# Patient Record
Sex: Male | Born: 1950 | Race: Black or African American | Hispanic: No | Marital: Married | Smoking: Former smoker
Health system: Southern US, Community
[De-identification: ages and names within clinical notes are randomized; demographics above are authoritative.]

## PROBLEM LIST (undated history)

## (undated) DIAGNOSIS — J302 Other seasonal allergic rhinitis: Secondary | ICD-10-CM

## (undated) DIAGNOSIS — G8929 Other chronic pain: Secondary | ICD-10-CM

## (undated) DIAGNOSIS — R200 Anesthesia of skin: Secondary | ICD-10-CM

## (undated) DIAGNOSIS — J449 Chronic obstructive pulmonary disease, unspecified: Secondary | ICD-10-CM

## (undated) DIAGNOSIS — M1990 Unspecified osteoarthritis, unspecified site: Secondary | ICD-10-CM

## (undated) DIAGNOSIS — M21379 Foot drop, unspecified foot: Secondary | ICD-10-CM

## (undated) DIAGNOSIS — N400 Enlarged prostate without lower urinary tract symptoms: Secondary | ICD-10-CM

## (undated) DIAGNOSIS — K219 Gastro-esophageal reflux disease without esophagitis: Secondary | ICD-10-CM

## (undated) DIAGNOSIS — E785 Hyperlipidemia, unspecified: Secondary | ICD-10-CM

## (undated) DIAGNOSIS — G709 Myoneural disorder, unspecified: Secondary | ICD-10-CM

## (undated) DIAGNOSIS — R350 Frequency of micturition: Secondary | ICD-10-CM

## (undated) DIAGNOSIS — K5790 Diverticulosis of intestine, part unspecified, without perforation or abscess without bleeding: Secondary | ICD-10-CM

## (undated) DIAGNOSIS — I10 Essential (primary) hypertension: Secondary | ICD-10-CM

## (undated) DIAGNOSIS — R351 Nocturia: Secondary | ICD-10-CM

## (undated) DIAGNOSIS — M549 Dorsalgia, unspecified: Secondary | ICD-10-CM

## (undated) DIAGNOSIS — T7840XA Allergy, unspecified, initial encounter: Secondary | ICD-10-CM

## (undated) DIAGNOSIS — E291 Testicular hypofunction: Secondary | ICD-10-CM

## (undated) DIAGNOSIS — F419 Anxiety disorder, unspecified: Secondary | ICD-10-CM

## (undated) HISTORY — PX: COLONOSCOPY: SHX174

## (undated) HISTORY — DX: Benign prostatic hyperplasia without lower urinary tract symptoms: N40.0

## (undated) HISTORY — PX: CERVICAL FUSION: SHX112

## (undated) HISTORY — DX: Chronic obstructive pulmonary disease, unspecified: J44.9

## (undated) HISTORY — DX: Hyperlipidemia, unspecified: E78.5

## (undated) HISTORY — DX: Gastro-esophageal reflux disease without esophagitis: K21.9

## (undated) HISTORY — PX: KNEE ARTHROSCOPY: SUR90

## (undated) HISTORY — PX: SEPTOPLASTY: SUR1290

## (undated) HISTORY — DX: Essential (primary) hypertension: I10

## (undated) HISTORY — DX: Testicular hypofunction: E29.1

## (undated) HISTORY — PX: BACK SURGERY: SHX140

## (undated) HISTORY — DX: Other seasonal allergic rhinitis: J30.2

## (undated) HISTORY — DX: Allergy, unspecified, initial encounter: T78.40XA

---

## 1999-01-06 ENCOUNTER — Ambulatory Visit (HOSPITAL_COMMUNITY): Admission: RE | Admit: 1999-01-06 | Discharge: 1999-01-06 | Payer: Self-pay | Admitting: *Deleted

## 1999-01-29 ENCOUNTER — Ambulatory Visit (HOSPITAL_COMMUNITY): Admission: RE | Admit: 1999-01-29 | Discharge: 1999-01-29 | Payer: Self-pay | Admitting: Internal Medicine

## 1999-02-11 ENCOUNTER — Encounter: Payer: Self-pay | Admitting: Internal Medicine

## 1999-02-11 ENCOUNTER — Ambulatory Visit (HOSPITAL_COMMUNITY): Admission: RE | Admit: 1999-02-11 | Discharge: 1999-02-11 | Payer: Self-pay | Admitting: Internal Medicine

## 2000-01-10 ENCOUNTER — Ambulatory Visit (HOSPITAL_COMMUNITY): Admission: RE | Admit: 2000-01-10 | Discharge: 2000-01-10 | Payer: Self-pay | Admitting: *Deleted

## 2001-01-17 ENCOUNTER — Ambulatory Visit (HOSPITAL_COMMUNITY): Admission: RE | Admit: 2001-01-17 | Discharge: 2001-01-17 | Payer: Self-pay | Admitting: Internal Medicine

## 2001-01-17 ENCOUNTER — Encounter: Payer: Self-pay | Admitting: Internal Medicine

## 2001-04-04 ENCOUNTER — Encounter: Payer: Self-pay | Admitting: Gastroenterology

## 2001-08-23 ENCOUNTER — Emergency Department (HOSPITAL_COMMUNITY): Admission: EM | Admit: 2001-08-23 | Discharge: 2001-08-23 | Payer: Self-pay | Admitting: Emergency Medicine

## 2001-08-23 ENCOUNTER — Encounter: Payer: Self-pay | Admitting: Emergency Medicine

## 2002-11-26 ENCOUNTER — Ambulatory Visit (HOSPITAL_COMMUNITY): Admission: RE | Admit: 2002-11-26 | Discharge: 2002-11-26 | Payer: Self-pay | Admitting: Internal Medicine

## 2002-11-26 ENCOUNTER — Encounter: Payer: Self-pay | Admitting: Internal Medicine

## 2005-02-03 ENCOUNTER — Ambulatory Visit (HOSPITAL_COMMUNITY): Admission: RE | Admit: 2005-02-03 | Discharge: 2005-02-03 | Payer: Self-pay | Admitting: Internal Medicine

## 2006-09-12 HISTORY — PX: SHOULDER SURGERY: SHX246

## 2006-11-23 ENCOUNTER — Ambulatory Visit (HOSPITAL_COMMUNITY): Admission: RE | Admit: 2006-11-23 | Discharge: 2006-11-23 | Payer: Self-pay | Admitting: Sports Medicine

## 2007-03-01 ENCOUNTER — Ambulatory Visit (HOSPITAL_COMMUNITY): Admission: RE | Admit: 2007-03-01 | Discharge: 2007-03-01 | Payer: Self-pay | Admitting: Internal Medicine

## 2008-03-11 ENCOUNTER — Ambulatory Visit (HOSPITAL_COMMUNITY): Admission: RE | Admit: 2008-03-11 | Discharge: 2008-03-11 | Payer: Self-pay | Admitting: Internal Medicine

## 2008-03-17 ENCOUNTER — Telehealth: Payer: Self-pay | Admitting: Gastroenterology

## 2008-04-01 ENCOUNTER — Ambulatory Visit: Payer: Self-pay | Admitting: Gastroenterology

## 2008-04-01 DIAGNOSIS — K5730 Diverticulosis of large intestine without perforation or abscess without bleeding: Secondary | ICD-10-CM | POA: Insufficient documentation

## 2008-05-07 ENCOUNTER — Ambulatory Visit: Payer: Self-pay | Admitting: Gastroenterology

## 2008-05-07 ENCOUNTER — Encounter: Payer: Self-pay | Admitting: Gastroenterology

## 2008-05-09 ENCOUNTER — Encounter: Payer: Self-pay | Admitting: Gastroenterology

## 2008-06-11 ENCOUNTER — Ambulatory Visit: Payer: Self-pay | Admitting: Gastroenterology

## 2008-07-01 ENCOUNTER — Ambulatory Visit: Payer: Self-pay | Admitting: Gastroenterology

## 2008-07-01 LAB — CONVERTED CEMR LAB
Fecal Occult Blood: NEGATIVE
OCCULT 4: NEGATIVE

## 2008-07-02 ENCOUNTER — Encounter: Payer: Self-pay | Admitting: Gastroenterology

## 2009-01-09 ENCOUNTER — Encounter: Payer: Self-pay | Admitting: Cardiology

## 2009-01-09 ENCOUNTER — Ambulatory Visit: Payer: Self-pay

## 2009-05-13 ENCOUNTER — Emergency Department (HOSPITAL_COMMUNITY): Admission: EM | Admit: 2009-05-13 | Discharge: 2009-05-13 | Payer: Self-pay | Admitting: Emergency Medicine

## 2009-11-10 ENCOUNTER — Ambulatory Visit: Payer: Self-pay | Admitting: Surgery

## 2009-11-10 ENCOUNTER — Ambulatory Visit (HOSPITAL_COMMUNITY): Admission: RE | Admit: 2009-11-10 | Discharge: 2009-11-10 | Payer: Self-pay | Admitting: Internal Medicine

## 2009-12-08 ENCOUNTER — Inpatient Hospital Stay (HOSPITAL_COMMUNITY): Admission: RE | Admit: 2009-12-08 | Discharge: 2009-12-11 | Payer: Self-pay | Admitting: Neurosurgery

## 2010-12-01 ENCOUNTER — Other Ambulatory Visit: Payer: Self-pay | Admitting: Neurosurgery

## 2010-12-01 DIAGNOSIS — M5410 Radiculopathy, site unspecified: Secondary | ICD-10-CM

## 2010-12-02 ENCOUNTER — Ambulatory Visit
Admission: RE | Admit: 2010-12-02 | Discharge: 2010-12-02 | Disposition: A | Discharge status: Home / Self Care | Payer: Private Health Insurance - Indemnity | Source: Ambulatory Visit | Attending: Neurosurgery | Admitting: Neurosurgery

## 2010-12-02 DIAGNOSIS — M5410 Radiculopathy, site unspecified: Secondary | ICD-10-CM

## 2010-12-05 LAB — CBC
HCT: 45.9 % (ref 39.0–52.0)
MCHC: 34.3 g/dL (ref 30.0–36.0)
MCV: 98.3 fL (ref 78.0–100.0)
Platelets: 233 10*3/uL (ref 150–400)
RDW: 13.6 % (ref 11.5–15.5)
WBC: 3.3 10*3/uL — ABNORMAL LOW (ref 4.0–10.5)

## 2010-12-05 LAB — SURGICAL PCR SCREEN: MRSA, PCR: NEGATIVE

## 2010-12-05 LAB — BASIC METABOLIC PANEL
BUN: 16 mg/dL (ref 6–23)
CO2: 27 mEq/L (ref 19–32)
Creatinine, Ser: 1.04 mg/dL (ref 0.4–1.5)
Glucose, Bld: 107 mg/dL — ABNORMAL HIGH (ref 70–99)

## 2010-12-05 LAB — MRSA PCR SCREENING: MRSA by PCR: NEGATIVE

## 2010-12-24 ENCOUNTER — Encounter (HOSPITAL_COMMUNITY)
Admission: RE | Admit: 2010-12-24 | Discharge: 2010-12-24 | Disposition: A | Discharge status: Home / Self Care | Payer: Managed Care, Other (non HMO) | Source: Ambulatory Visit | Attending: Neurosurgery | Admitting: Neurosurgery

## 2010-12-24 LAB — CBC
RBC: 4.25 MIL/uL (ref 4.22–5.81)
WBC: 3.8 10*3/uL — ABNORMAL LOW (ref 4.0–10.5)

## 2010-12-24 LAB — SURGICAL PCR SCREEN: MRSA, PCR: NEGATIVE

## 2010-12-29 ENCOUNTER — Ambulatory Visit (HOSPITAL_COMMUNITY)
Admission: RE | Admit: 2010-12-29 | Discharge: 2010-12-29 | Disposition: A | Discharge status: Home / Self Care | Payer: Managed Care, Other (non HMO) | Source: Ambulatory Visit | Attending: Neurosurgery | Admitting: Neurosurgery

## 2010-12-29 ENCOUNTER — Inpatient Hospital Stay (HOSPITAL_COMMUNITY)
Admission: RE | Admit: 2010-12-29 | Discharge: 2010-12-30 | DRG: 473 | Disposition: A | Discharge status: Home / Self Care | Payer: Managed Care, Other (non HMO) | Source: Ambulatory Visit | Attending: Neurosurgery | Admitting: Neurosurgery

## 2010-12-29 ENCOUNTER — Other Ambulatory Visit (HOSPITAL_COMMUNITY): Payer: Self-pay | Admitting: Neurosurgery

## 2010-12-29 DIAGNOSIS — M4692 Unspecified inflammatory spondylopathy, cervical region: Secondary | ICD-10-CM

## 2010-12-29 DIAGNOSIS — M5020 Other cervical disc displacement, unspecified cervical region: Principal | ICD-10-CM | POA: Diagnosis present

## 2010-12-31 NOTE — Op Note (Signed)
  NAMEJONANTHAN, Jacob Larsen NO.:  [id number]  MEDICAL RECORD NO.:  [id number]           PATIENT TYPE:  O  LOCATION:  XRAY                         FACILITY:  [facility]  PHYSICIAN:  Hilda Lias, M.D.   DATE OF BIRTH:  05-22-1951  DATE OF PROCEDURE:  12/29/2010 DATE OF DISCHARGE:  12/29/2010                              OPERATIVE REPORT   ADMISSION DIAGNOSES:  C6-7 herniated disk with radiculopathy, left worse than right one, herniated disk, status post fusion C3-C6.  POSTOPERATIVE DIAGNOSES:  C6-7 herniated disk with radiculopathy, left worse than right one, herniated disk, status post fusion C3-C6.  PROCEDURE:  Anterior C6-C7 diskectomy, decompression of spinal cord, bilateral foraminotomy, removal of free fragment bilaterally left worse than right one.  Allograft plate.  Microscope.  SURGEON:  Hilda Lias, MD  CLINICAL HISTORY:  [name] is a gentleman who underwent anterior cervical diskectomy.  Now, he is complaining of neck pain worsened mostly into the left upper extremity.  He has failed with conservative treatment.  Myelogram showed that he has a herniated disk bilaterally, left worse than right one.  Surgery was advised.  PROCEDURE:  The patient was taken to the OR and after intubation, the left side of the neck was cleaned with DuraPrep.  Transverse incision below the previous wound was made through the skin, subcutaneous tissue. Dissection was carried out all the way down to cervical spine.  X-rays showed that indeed we were at the level of C7-T1 and from then on, we worked our way up.  The patient has a large osteophyte at the level of C6-7.  The osteophyte was removed and we entered the disk space.  The disk space was quite narrow.  Diskectomy was accomplished.  With the help of the microscope, we found quite a bit of calcification of the posterior ligament.  Once the ligament was opened, there was a herniated disk bilaterally, left worse than  right one.  In the left side, there were 2 pieces with quite a bit of adhesion to the C7 nerve root.  Having good decompression of the spinal cord as well as the both C7 nerve roots, the endplate was drilled and allograft of 6 mm, lordotic with autograft inside was inserted followed by a plate using a single plate with two screws.  Lateral cervical spine showed good position of the plate and the graft.  Hemostasis was done.  We waited 10 minutes to be sure that we accomplished a good hemostasis and once this was done, the wound was closed with Vicryl and Steri-Strips.          ______________________________ Hilda Lias, M.D.     EB/MEDQ  D:  12/29/2010  T:  12/30/2010  Job:  [id number]  Electronically Signed by Hilda Lias M.D. on 12/31/2010 06:59:36 PM

## 2010-12-31 NOTE — Discharge Summary (Signed)
  NAMENICK, STULTS NO.:  [id number]  MEDICAL RECORD NO.:  [id number]           PATIENT TYPE:  I  LOCATION:  3535                         FACILITY:  [facility]  PHYSICIAN:  Hilda Lias, M.D.   DATE OF BIRTH:  Jul 30, 1951  DATE OF ADMISSION:  12/29/2010 DATE OF DISCHARGE:  12/30/2010                              DISCHARGE SUMMARY   ADMISSION DIAGNOSIS:  C6-C7 herniated disk with acute radiculopathy status post fusion at 3-4, 4-5, 5-6.  FINAL DIAGNOSES:  C6-C7 herniated disk with acute radiculopathy status post fusion at 3-4, 4-5, 5-6.  CLINICAL HISTORY:  Mr. Malkin is a gentleman who in the past underwent fusion at three levels in the cervical area.  Now, he complains of neck pain with radiation to both upper extremities.  Myelogram showed that he has a herniated disk at the level of C6-7 bilaterally, left worse than right one.  The patient desired go ahead with surgery.  Laboratory normal.  COURSE IN THE HOSPITAL:  The patient was taken to surgery yesterday and had decompression and fusion of the level of 6/7 using allograft and plate was done.  Today, left than 24 hours he is doing great.  He is eating, has no complaint whatsoever.  He is ready to go home.  CONDITION ON DISCHARGE:  Improvement.  MEDICATION:  Percocet AND diazepam.  DIET:  Regular.  ACTIVITY:  Not to drive for at least a week.  FOLLOWUP:  He will call my office to set up an appointment.          ______________________________ Hilda Lias, M.D.     EB/MEDQ  D:  12/30/2010  T:  12/31/2010  Job:  [id number]  Electronically Signed by Hilda Lias M.D. on 12/31/2010 06:59:39 PM

## 2010-12-31 NOTE — H&P (Signed)
  NAME:  EUSTACE, HUR NO.:  [id number]  MEDICAL RECORD NO.:  [id number]           PATIENT TYPE:  I  LOCATION:  3535                         FACILITY:  [facility]  PHYSICIAN:  Hilda Lias, M.D.   DATE OF BIRTH:  1951-06-28  DATE OF ADMISSION:  12/29/2010 DATE OF DISCHARGE:                             HISTORY & PHYSICAL   Mr. Jacob Larsen is a gentleman who underwent decompression fusion at the level of cervical 3, 4, 5, and 6 because of myelopathy.  The patient did well but lately he had been complaining of some pain down to the left upper extremity associated with some weakness.  The patient had conservative treatment.  He had an EMG, which was negative.  Myelogram showed that he has a herniated disk at the level of C6-7, mostly going to the left side with foraminal narrowing __________.  Because of the finding, he wanted to proceed with surgery.  PAST MEDICAL HISTORY:  Anterior cervical fusion, shoulder surgery, knee surgery.  ALLERGIES:  The patient is allergic to ASPIRIN.  SOCIAL HISTORY:  Negative.  FAMILY HISTORY:  Unremarkable.  REVIEW OF SYSTEMS:  Positive for neck pain with radiation to the left upper extremity.  PHYSICAL EXAMINATION:  HEAD, EARS, NOSE, AND THROAT:  Normal. NECK:  He has a scar anteriorly.  He has pain with lateralization. LUNGS:  Clear. HEART:  Heart sounds normal. ABDOMEN:  Normal. EXTREMITIES:  Normal pulse. NEUROLOGIC:  Weakness on the left side.  Reflexes are symmetrical. Coordination and gait normal.  __________ .  IMPRESSION:  Status post fusion 3-4, 4-5, 5-6.  New onset of a herniated disk at the level C6-7 with bilateral narrowing.  RECOMMENDATIONS:  The patient is being admitted.  We talked about conservative treatment.  We talked about surgery.  At the end, he feels that the medication is not helping.  He wants to proceed with surgery. Procedure will be anterior cervical diskectomy at the level C6, C7. Ideally, we are  going to leave the plate behind and put a new one.  He knows the risks of surgery such as infection, CSF leak, worsening pain, paralysis, need for further surgery.          ______________________________ Hilda Lias, M.D.     EB/MEDQ  D:  12/29/2010  T:  12/30/2010  Job:  [id number]  Electronically Signed by Hilda Lias M.D. on 12/31/2010 06:59:37 PM

## 2011-03-25 ENCOUNTER — Other Ambulatory Visit: Payer: Self-pay

## 2011-04-16 ENCOUNTER — Encounter: Payer: Self-pay | Admitting: Gastroenterology

## 2011-05-24 ENCOUNTER — Encounter: Payer: Self-pay | Admitting: Gastroenterology

## 2011-05-24 ENCOUNTER — Ambulatory Visit (AMBULATORY_SURGERY_CENTER): Payer: 59 | Admitting: *Deleted

## 2011-05-24 VITALS — Ht 74.0 in | Wt 202.9 lb

## 2011-05-24 DIAGNOSIS — Z1211 Encounter for screening for malignant neoplasm of colon: Secondary | ICD-10-CM

## 2011-05-24 MED ORDER — SUPREP BOWEL PREP KIT 17.5-3.13-1.6 GM/177ML PO SOLN: 1.0000 | Freq: Once | ORAL | Status: DC

## 2011-05-24 NOTE — Progress Notes (Signed)
Pt says that he had colonoscopy 2009.  Dr. Arlyce Dice performed colonoscopy in 2009 per procedure report.  Recall due 2019 per procedure report.  Colonoscopy cancelled and recall for 2019 entered in to computer. Jacob Larsen

## 2011-06-07 ENCOUNTER — Other Ambulatory Visit: Payer: Managed Care, Other (non HMO) | Admitting: Gastroenterology

## 2012-04-05 ENCOUNTER — Other Ambulatory Visit (HOSPITAL_COMMUNITY): Payer: Self-pay | Admitting: Internal Medicine

## 2012-04-05 ENCOUNTER — Ambulatory Visit (HOSPITAL_COMMUNITY)
Admission: RE | Admit: 2012-04-05 | Discharge: 2012-04-05 | Disposition: A | Discharge status: Home / Self Care | Payer: 59 | Source: Ambulatory Visit | Attending: Internal Medicine | Admitting: Internal Medicine

## 2012-04-05 DIAGNOSIS — R05 Cough: Secondary | ICD-10-CM

## 2012-04-05 DIAGNOSIS — R059 Cough, unspecified: Secondary | ICD-10-CM | POA: Insufficient documentation

## 2013-05-13 ENCOUNTER — Ambulatory Visit (INDEPENDENT_AMBULATORY_CARE_PROVIDER_SITE_OTHER): Payer: 59 | Admitting: Family Medicine

## 2013-05-13 ENCOUNTER — Ambulatory Visit: Payer: 59

## 2013-05-13 VITALS — BP 132/84 | HR 93 | Temp 98.5°F | Resp 18 | Ht 72.5 in | Wt 197.0 lb

## 2013-05-13 DIAGNOSIS — M542 Cervicalgia: Secondary | ICD-10-CM
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
## 2013-05-20 DIAGNOSIS — M542 Cervicalgia: Secondary | ICD-10-CM | POA: Insufficient documentation

## 2013-08-13 ENCOUNTER — Other Ambulatory Visit: Payer: Self-pay | Admitting: Internal Medicine

## 2013-08-13 ENCOUNTER — Encounter: Payer: Self-pay | Admitting: Internal Medicine

## 2013-08-13 ENCOUNTER — Ambulatory Visit (INDEPENDENT_AMBULATORY_CARE_PROVIDER_SITE_OTHER): Payer: 59 | Admitting: Internal Medicine

## 2013-08-13 VITALS — BP 126/82 | HR 88 | Temp 98.1°F | Resp 16 | Wt 210.6 lb

## 2013-08-13 DIAGNOSIS — E538 Deficiency of other specified B group vitamins: Secondary | ICD-10-CM

## 2013-08-13 DIAGNOSIS — E559 Vitamin D deficiency, unspecified: Secondary | ICD-10-CM | POA: Insufficient documentation

## 2013-08-13 DIAGNOSIS — N529 Male erectile dysfunction, unspecified: Secondary | ICD-10-CM

## 2013-08-13 DIAGNOSIS — K2100 Gastro-esophageal reflux disease with esophagitis, without bleeding: Secondary | ICD-10-CM

## 2013-08-13 DIAGNOSIS — Z79899 Other long term (current) drug therapy: Secondary | ICD-10-CM

## 2013-08-13 DIAGNOSIS — R7309 Other abnormal glucose: Secondary | ICD-10-CM | POA: Insufficient documentation

## 2013-08-13 DIAGNOSIS — I10 Essential (primary) hypertension: Secondary | ICD-10-CM

## 2013-08-13 DIAGNOSIS — S43499A Other sprain of unspecified shoulder joint, initial encounter: Secondary | ICD-10-CM

## 2013-08-13 DIAGNOSIS — E782 Mixed hyperlipidemia: Secondary | ICD-10-CM | POA: Insufficient documentation

## 2013-08-13 HISTORY — DX: Gastro-esophageal reflux disease with esophagitis, without bleeding: K21.00

## 2013-08-13 LAB — HEPATIC FUNCTION PANEL
Alkaline Phosphatase: 73 U/L (ref 39–117)
Bilirubin, Direct: 0.1 mg/dL (ref 0.0–0.3)
Indirect Bilirubin: 0.2 mg/dL (ref 0.0–0.9)
Total Protein: 7.3 g/dL (ref 6.0–8.3)

## 2013-08-13 LAB — CBC WITH DIFFERENTIAL/PLATELET
Basophils Absolute: 0 10*3/uL (ref 0.0–0.1)
HCT: 46.1 % (ref 39.0–52.0)
Hemoglobin: 16.2 g/dL (ref 13.0–17.0)
Lymphocytes Relative: 37 % (ref 12–46)
Monocytes Absolute: 0.4 10*3/uL (ref 0.1–1.0)
Monocytes Relative: 9 % (ref 3–12)
Neutro Abs: 1.9 10*3/uL (ref 1.7–7.7)
RDW: 14.3 % (ref 11.5–15.5)
WBC: 3.8 10*3/uL — ABNORMAL LOW (ref 4.0–10.5)

## 2013-08-13 LAB — LIPID PANEL
Cholesterol: 184 mg/dL (ref 0–200)
HDL: 41 mg/dL (ref >39)
Total CHOL/HDL Ratio: 4.5 Ratio
VLDL: 52 mg/dL — ABNORMAL HIGH (ref 0–40)

## 2013-08-13 LAB — BASIC METABOLIC PANEL WITH GFR
BUN: 14 mg/dL (ref 6–23)
Chloride: 104 mEq/L (ref 96–112)
GFR, Est Non African American: 78 mL/min
Potassium: 4.2 mEq/L (ref 3.5–5.3)

## 2013-08-13 LAB — MAGNESIUM: Magnesium: 2 mg/dL (ref 1.5–2.5)

## 2013-08-13 LAB — TESTOSTERONE: Testosterone: 694 ng/dL (ref 300–890)

## 2013-08-13 LAB — TSH: TSH: 1.287 u[IU]/mL (ref 0.350–4.500)

## 2013-08-13 MED ORDER — CYANOCOBALAMIN ER 1000 MCG PO TBCR: 1000.0000 ug | EXTENDED_RELEASE_TABLET | Freq: Every morning | ORAL | Status: AC

## 2013-08-13 MED ORDER — FLAX SEED OIL 1000 MG PO CAPS: 1000.0000 mg | ORAL_CAPSULE | Freq: Two times a day (BID) | ORAL | Status: AC

## 2013-08-13 MED ORDER — ATORVASTATIN CALCIUM 80 MG PO TABS: 80.0000 mg | ORAL_TABLET | Freq: Every day | ORAL | Status: DC

## 2013-08-13 MED ORDER — DICLOFENAC SODIUM 75 MG PO TBEC: 75.0000 mg | DELAYED_RELEASE_TABLET | Freq: Two times a day (BID) | ORAL | Status: DC

## 2013-08-13 NOTE — Progress Notes (Signed)
Patient ID: Jacob Larsen, male   DOB: [birth year], 62 y.o.   MRN: [id number]   This very nice 62 yo MBM presents for 3 month follow up with Hypertension, Hyperlipidemia, GERD, Hypogonadism, pre-diabetes and Vitamin D deficiency.    BP has been controlled at home. Today's BP is 126/82. Patient denies any cardiac type chest pain, palpitations, dyspnea/orthopnea/PND, dizziness, claudication, or dependent edema.   Hyperlipidemia is controlled with diet & meds. Last cholesterol was  129, Triglycerides were 65, HDL  45 and LDL 71. Patient denies myalgias or other med SE's.  He is concerned about the exuberant  $$ cost of zetia and was intolerant to lopid (elev CPK) , but has never been tried on a statin.   Also, the patient has history of prediabetes/insulin resistance with last A1c of 5.4% in July (was 6.0% in Feb 2014). Patient denies any symptoms of reactive hypoglycemia, diabetic polys, paresthesias or visual blurring.   Further, Patient has history of vitamin D deficiency with last vitamin D of 72. Patient supplements vitamin D without any suspected side-effects.  Current Outpatient Prescriptions on File Prior to Visit  Medication Sig Dispense Refill  . Cyanocobalamin (VITAMIN B 12 PO) Take 1,000 mg by mouth daily.        . cyanocobalamin 2000 MCG tablet Take 2,000 mcg by mouth daily.        . diclofenac-misoprostol (ARTHROTEC 50) 50-0.2 MG per tablet Take 1 tablet by mouth 2 (two) times daily.        Marland Kitchen omeprazole (PRILOSEC) 20 MG capsule Take 20 mg by mouth daily.        . Tamsulosin HCl (FLOMAX) 0.4 MG CAPS Take 0.4 mg by mouth daily after supper.        . testosterone enanthate (DELATESTRYL) 200 MG/ML injection Inject into the muscle every 28 (twenty-eight) days.            Allergies  Allergen Reactions  . Salicylates Rash  . Aspirin     REACTION: rash    PMHx:   Past Medical History  Diagnosis Date  . Seasonal allergies   . GERD (gastroesophageal reflux disease)   .  Hyperlipidemia   . BPH (benign prostatic hyperplasia)   . Hypogonadism male   . ED (erectile dysfunction)   . Pre-diabetes     FHx:    Reviewed / unchanged  SHx:    Reviewed / unchanged  Systems Review: Constitutional: Denies fever, chills, wt changes, headaches, insomnia, fatigue, night sweats, change in appetite. Eyes: Denies redness, blurred vision, diplopia, discharge, itchy, watery eyes.  ENT: Denies discharge, congestion, post nasal drip, epistaxis, sore throat, earache, hearing loss, dental pain, tinnitus, vertigo, sinus pain, snoring.  CV: Denies chest pain, palpitations, irregular heartbeat, syncope, dyspnea, diaphoresis, orthopnea, PND, claudication, edema. Respiratory: denies cough, dyspnea, DOE, pleurisy, hoarseness, laryngitis, wheezing.  Gastrointestinal: Denies dysphagia, odynophagia, heartburn, reflux, water brash, abdominal pain or cramps, nausea, vomiting, bloating, diarrhea, constipation, hematemesis, melena, hematochezia,  Hemorrhoids. Genitourinary: Denies dysuria, frequency, urgency, nocturia, hesitancy, discharge, hematuria, flank pain. Musculoskeletal: Denies arthralgias, myalgias, stiffness, jt. swelling, pain, limp, strain/sprain.  Skin: Denies pruritus, rash, hives, warts, acne, eczema, change in skin lesion(s). Neuro: No weakness, tremor, incoordination, spasms, paresthesia, or pain. Psychiatric: Denies confusion, memory loss, or sensory loss. Endo: Denies change in weight, skin, hair change.  Heme/Lymph: No excessive bleeding, bruising, orenlarged lymph nodes.  Filed Vitals:   08/13/13 1103  BP: 126/82  Pulse: 88  Temp: 98.1 F (36.7 C)  Resp: 16  Estimated body mass index is 28.15 kg/(m^2) as calculated from the following:   Height as of 05/13/13: 6' 0.5" (1.842 m).   Weight as of this encounter: 210 lb 9.6 oz (95.528 kg).  On Exam: Appears well nourished - in no distress. Eyes: PERRLA, EOMs, conjunctiva no swelling or erythema. Sinuses: No  frontal/maxillary tenderness ENT/Mouth: EAC's clear, TM's nl w/o erythema, bulging. Nares clear w/o erythema, swelling, exudates. Oropharynx clear without erythema or exudates. Oral hygiene is good. Tongue normal, non obstructing. Hearing intact.  Neck: Supple. Thyroid nl. Car 2+/2+ without bruits, nodes or JVD. Chest: Respirations nl with BS clear & equal w/o rales, rhonchi, wheezing or stridor.  Cor: Heart sounds normal w/ regular rate and rhythm without sig. murmurs, gallops, clicks, or rubs. Peripheral pulses normal and equal  without edema. Musculoskeletal: Full ROM all peripheral extremities, joint stability, 5/5 strength, and normal gait.  Skin: Warm, dry without exposed rashes, lesions, ecchymosis apparent.  Neuro: Cranial nerves intact, reflexes equal bilaterally. Sensory-motor testing grossly intact. Tendon reflexes grossly intact.  Pysch: Alert & oriented x 3. Insight and judgement nl & appropriate. No ideations.  Assessment and Plan:  1. Hypertension - Continue monitor blood pressure at home. Continue diet/meds same.  2. Hyperlipidemia - Continue diet/meds, exercise,& lifestyle modifications. Continue monitor periodic cholesterol/liver & renal functions - d/c Zetia & try on Atorvastatin 80 mg x 1/2 tab= 40 mg 3 x wk  3. Pre-diabetes/Insulin Resistance - Continue diet, exercise, lifestyle modifications. Monitor appropriate labs.  4. Vitamin D Deficiency - Continue supplementation.  Further disposition pending results of labs.

## 2013-08-13 NOTE — Patient Instructions (Addendum)
Continue diet & medications same as discussed.   Further disposition pending lab results.    STOP ZETIA / Ezetimibe    Start ATORVASTATIN 80 mg take 1/2 tab (=40 mg)  3 x week on Tues - Thurs - and Sat  Hypertension As your heart beats, it forces blood through your arteries. This force is your blood pressure. If the pressure is too high, it is called hypertension (HTN) or high blood pressure. HTN is dangerous because you may have it and not know it. High blood pressure may mean that your heart has to work harder to pump blood. Your arteries may be narrow or stiff. The extra work puts you at risk for heart disease, stroke, and other problems.  Blood pressure consists of two numbers, a higher number over a lower, 110/72, for example. It is stated as "110 over 72." The ideal is below 120 for the top number (systolic) and under 80 for the bottom (diastolic). Write down your blood pressure today. You should pay close attention to your blood pressure if you have certain conditions such as:  Heart failure.  Prior heart attack.  Diabetes  Chronic kidney disease.  Prior stroke.  Multiple risk factors for heart disease. To see if you have HTN, your blood pressure should be measured while you are seated with your arm held at the level of the heart. It should be measured at least twice. A one-time elevated blood pressure reading (especially in the Emergency Department) does not mean that you need treatment. There may be conditions in which the blood pressure is different between your right and left arms. It is important to see your caregiver soon for a recheck. Most people have essential hypertension which means that there is not a specific cause. This type of high blood pressure may be lowered by changing lifestyle factors such as:  Stress.  Smoking.  Lack of exercise.  Excessive weight.  Drug/tobacco/alcohol use.  Eating less salt. Most people do not have symptoms from high blood  pressure until it has caused damage to the body. Effective treatment can often prevent, delay or reduce that damage. TREATMENT  When a cause has been identified, treatment for high blood pressure is directed at the cause. There are a large number of medications to treat HTN. These fall into several categories, and your caregiver will help you select the medicines that are best for you. Medications may have side effects. You should review side effects with your caregiver. If your blood pressure stays high after you have made lifestyle changes or started on medicines,   Your medication(s) may need to be changed.  Other problems may need to be addressed.  Be certain you understand your prescriptions, and know how and when to take your medicine.  Be sure to follow up with your caregiver within the time frame advised (usually within two weeks) to have your blood pressure rechecked and to review your medications.  If you are taking more than one medicine to lower your blood pressure, make sure you know how and at what times they should be taken. Taking two medicines at the same time can result in blood pressure that is too low. SEEK IMMEDIATE MEDICAL CARE IF:  You develop a severe headache, blurred or changing vision, or confusion.  You have unusual weakness or numbness, or a faint feeling.  You have severe chest or abdominal pain, vomiting, or breathing problems. MAKE SURE YOU:   Understand these instructions.  Will watch your condition.  Will get help right away if you are not doing well or get worse. Document Released: 08/29/2005 Document Revised: 11/21/2011 Document Reviewed: 04/18/2008 [facility] Patient Information 2014 [place], [place].   Cholesterol Cholesterol is a white, waxy, fat-like protein needed by your body in small amounts. The liver makes all the cholesterol you need. It is carried from the liver by the blood through the blood vessels. Deposits (plaque) may build up on blood  vessel walls. This makes the arteries narrower and stiffer. Plaque increases the risk for heart attack and stroke. You cannot feel your cholesterol level even if it is very high. The only way to know is by a blood test to check your lipid (fats) levels. Once you know your cholesterol levels, you should keep a record of the test results. Work with your caregiver to to keep your levels in the desired range. WHAT THE RESULTS MEAN:  Total cholesterol is a rough measure of all the cholesterol in your blood.  LDL is the so-called bad cholesterol. This is the type that deposits cholesterol in the walls of the arteries. You want this level to be low.  HDL is the good cholesterol because it cleans the arteries and carries the LDL away. You want this level to be high.  Triglycerides are fat that the body can either burn for energy or store. High levels are closely linked to heart disease. DESIRED LEVELS:  Total cholesterol below 200.  LDL below 100 for people at risk, below 70 for very high risk.  HDL above 50 is good, above 60 is best.  Triglycerides below 150. HOW TO LOWER YOUR CHOLESTEROL:  Diet.  Choose fish or white meat chicken and [place], roasted or baked. Limit fatty cuts of red meat, fried foods, and processed meats, such as sausage and lunch meat.  Eat lots of fresh fruits and vegetables. Choose whole grains, beans, pasta, potatoes and cereals.  Use only small amounts of olive, corn or canola oils. Avoid butter, mayonnaise, shortening or palm kernel oils. Avoid foods with trans-fats.  Use skim/nonfat milk and low-fat/nonfat yogurt and cheeses. Avoid whole milk, cream, ice cream, egg yolks and cheeses. Healthy desserts include angel food cake, ginger snaps, animal crackers, hard candy, popsicles, and low-fat/nonfat frozen yogurt. Avoid pastries, cakes, pies and cookies.  Exercise.  A regular program helps decrease LDL and raises HDL.  Helps with weight control.  Do things that  increase your activity level like gardening, walking, or taking the stairs.  Medication.  May be prescribed by your caregiver to help lowering cholesterol and the risk for heart disease.  You may need medicine even if your levels are normal if you have several risk factors. HOME CARE INSTRUCTIONS   Follow your diet and exercise programs as suggested by your caregiver.  Take medications as directed.  Have blood work done when your caregiver feels it is necessary. MAKE SURE YOU:   Understand these instructions.  Will watch your condition.  Will get help right away if you are not doing well or get worse. Document Released: 05/24/2001 Document Revised: 11/21/2011 Document Reviewed: 11/14/2007 [facility] Patient Information 2014 [place], [place].   Diabetes and Exercise Exercising regularly is important. It is not just about losing weight. It has many health benefits, such as:  Improving your overall fitness, flexibility, and endurance.  Increasing your bone density.  Helping with weight control.  Decreasing your body fat.  Increasing your muscle strength.  Reducing stress and tension.  Improving your overall health. People with diabetes  who exercise gain additional benefits because exercise:  Reduces appetite.  Improves the body's use of blood sugar (glucose).  Helps lower or control blood glucose.  Decreases blood pressure.  Helps control blood lipids (such as cholesterol and triglycerides).  Improves the body's use of the hormone insulin by:  Increasing the body's insulin sensitivity.  Reducing the body's insulin needs.  Decreases the risk for heart disease because exercising:  Lowers cholesterol and triglycerides levels.  Increases the levels of good cholesterol (such as high-density lipoproteins [HDL]) in the body.  Lowers blood glucose levels. YOUR ACTIVITY PLAN  Choose an activity that you enjoy and set realistic goals. Your health care provider or  diabetes educator can help you make an activity plan that works for you. You can break activities into 2 or 3 sessions throughout the day. Doing so is as good as one long session. Exercise ideas include:  Taking the dog for a walk.  Taking the stairs instead of the elevator.  Dancing to your favorite song.  Doing your favorite exercise with a friend. RECOMMENDATIONS FOR EXERCISING WITH TYPE 1 OR TYPE 2 DIABETES   Check your blood glucose before exercising. If blood glucose levels are greater than 240 mg/dL, check for urine ketones. Do not exercise if ketones are present.  Avoid injecting insulin into areas of the body that are going to be exercised. For example, avoid injecting insulin into:  The arms when playing tennis.  The legs when jogging.  Keep a record of:  Food intake before and after you exercise.  Expected peak times of insulin action.  Blood glucose levels before and after you exercise.  The type and amount of exercise you have done.  Review your records with your health care provider. Your health care provider will help you to develop guidelines for adjusting food intake and insulin amounts before and after exercising.  If you take insulin or oral hypoglycemic agents, watch for signs and symptoms of hypoglycemia. They include:  Dizziness.  Shaking.  Sweating.  Chills.  Confusion.  Drink plenty of water while you exercise to prevent dehydration or heat stroke. Body water is lost during exercise and must be replaced.  Talk to your health care provider before starting an exercise program to make sure it is safe for you. Remember, almost any type of activity is better than none. Document Released: 11/19/2003 Document Revised: 05/01/2013 Document Reviewed: 02/05/2013 [facility] Patient Information 2014 [place], [place].   Vitamin D Deficiency Vitamin D is an important vitamin that your body needs. Having too little of it in your body is called a deficiency. A  very bad deficiency can make your bones soft and can cause a condition called rickets.  Vitamin D is important to your body for different reasons, such as:   It helps your body absorb 2 minerals called calcium and phosphorus.  It helps make your bones healthy.  It may prevent some diseases, such as diabetes and multiple sclerosis.  It helps your muscles and heart. You can get vitamin D in several ways. It is a natural part of some foods. The vitamin is also added to some dairy products and cereals. Some people take vitamin D supplements. Also, your body makes vitamin D when you are in the sun. It changes the sun's rays into a form of the vitamin that your body can use. CAUSES   Not eating enough foods that contain vitamin D.  Not getting enough sunlight.  Having certain digestive system diseases that make  it hard to absorb vitamin D. These diseases include Crohn's disease, chronic pancreatitis, and cystic fibrosis.  Having a surgery in which part of the stomach or small intestine is removed.  Being obese. Fat cells pull vitamin D out of your blood. That means that obese people may not have enough vitamin D left in their blood and in other body tissues.  Having chronic kidney or liver disease. RISK FACTORS Risk factors are things that make you more likely to develop a vitamin D deficiency. They include:  Being older.  Not being able to get outside very much.  Living in a nursing home.  Having had broken bones.  Having weak or thin bones (osteoporosis).  Having a disease or condition that changes how your body absorbs vitamin D.  Having dark skin.  Some medicines such as seizure medicines or steroids.  Being overweight or obese. SYMPTOMS Mild cases of vitamin D deficiency may not have any symptoms. If you have a very bad case, symptoms may include:  Bone pain.  Muscle pain.  Falling often.  Broken bones caused by a minor injury, due to osteoporosis. DIAGNOSIS A  blood test is the best way to tell if you have a vitamin D deficiency. TREATMENT Vitamin D deficiency can be treated in different ways. Treatment for vitamin D deficiency depends on what is causing it. Options include:  Taking vitamin D supplements.  Taking a calcium supplement. Your caregiver will suggest what dose is best for you. HOME CARE INSTRUCTIONS  Take any supplements that your caregiver prescribes. Follow the directions carefully. Take only the suggested amount.  Have your blood tested 2 months after you start taking supplements.  Eat foods that contain vitamin D. Healthy choices include:  Fortified dairy products, cereals, or juices. Fortified means vitamin D has been added to the food. Check the label on the package to be sure.  Fatty fish like salmon or trout.  Eggs.  Oysters.  Do not use a tanning bed.  Keep your weight at a healthy level. Lose weight if you need to.  Keep all follow-up appointments. Your caregiver will need to perform blood tests to make sure your vitamin D deficiency is going away. SEEK MEDICAL CARE IF:  You have any questions about your treatment.  You continue to have symptoms of vitamin D deficiency.  You have nausea or vomiting.  You are constipated.  You feel confused.  You have severe abdominal or back pain. MAKE SURE YOU:  Understand these instructions.  Will watch your condition.  Will get help right away if you are not doing well or get worse. Document Released: 11/21/2011 Document Revised: 12/24/2012 Document Reviewed: 11/21/2011 [facility] Patient Information 2014 [place], [place].

## 2013-08-14 DIAGNOSIS — M5412 Radiculopathy, cervical region: Secondary | ICD-10-CM | POA: Insufficient documentation

## 2013-08-14 HISTORY — DX: Radiculopathy, cervical region: M54.12

## 2013-08-14 LAB — VITAMIN D 25 HYDROXY (VIT D DEFICIENCY, FRACTURES): Vit D, 25-Hydroxy: 62 ng/mL (ref 30–89)

## 2013-08-15 ENCOUNTER — Encounter: Payer: Self-pay | Admitting: Internal Medicine

## 2013-08-23 DIAGNOSIS — G5600 Carpal tunnel syndrome, unspecified upper limb: Secondary | ICD-10-CM | POA: Insufficient documentation

## 2013-09-12 HISTORY — PX: SHOULDER ARTHROSCOPY: SHX128

## 2013-09-20 ENCOUNTER — Encounter: Payer: Self-pay | Admitting: Physician Assistant

## 2013-09-20 ENCOUNTER — Ambulatory Visit (INDEPENDENT_AMBULATORY_CARE_PROVIDER_SITE_OTHER): Payer: 59 | Admitting: Physician Assistant

## 2013-09-20 VITALS — BP 142/84 | HR 76 | Temp 98.1°F | Resp 16 | Wt 211.4 lb

## 2013-09-20 DIAGNOSIS — H6590 Unspecified nonsuppurative otitis media, unspecified ear: Secondary | ICD-10-CM

## 2013-09-20 DIAGNOSIS — R42 Dizziness and giddiness: Secondary | ICD-10-CM

## 2013-09-20 DIAGNOSIS — H6593 Unspecified nonsuppurative otitis media, bilateral: Secondary | ICD-10-CM

## 2013-09-20 MED ORDER — FLUTICASONE PROPIONATE 50 MCG/ACT NA SUSP: 1.0000 | Freq: Every day | NASAL | Status: DC

## 2013-09-20 MED ORDER — MECLIZINE HCL 25 MG PO TABS: 25.0000 mg | ORAL_TABLET | Freq: Three times a day (TID) | ORAL | Status: DC | PRN

## 2013-09-20 NOTE — Progress Notes (Signed)
   Subjective:    Patient ID: Jacob Larsen, male    DOB: [birth year], 63 y.o.   MRN: [id number]  HPI Patient states that he has had nasal congestion for 3-4 days, sinus pressure/pain. There were two times, one yesterday and one this AM both were while lying in bed, he felt like the room was spinning and felt nausea. Vertigo worse with quick turns or lying in bed.   Denies headache, changes in vision/speech, tinnitus, trouble the ears, fever, chills, weakness on one side or the other.   Review of Systems  Constitutional: Negative.   HENT: Positive for congestion, postnasal drip and sinus pressure. Negative for dental problem, ear discharge, ear pain, rhinorrhea, sneezing, sore throat, tinnitus, trouble swallowing and voice change.   Eyes: Negative.   Respiratory: Negative.   Cardiovascular: Negative.   Neurological: Positive for dizziness. Negative for tremors, seizures, syncope, facial asymmetry, speech difficulty, weakness, light-headedness, numbness and headaches.      Objective:   Physical Exam  Constitutional: He is oriented to person, place, and time. He appears well-developed and well-nourished.  HENT:  Head: Normocephalic and atraumatic.  Right Ear: External ear normal. Tympanic membrane is not injected and not erythematous. A middle ear effusion is present.  Left Ear: External ear normal. Tympanic membrane is not injected and not erythematous. A middle ear effusion is present.  Mouth/Throat: Oropharynx is clear and moist.  Eyes: Conjunctivae and EOM are normal. Pupils are equal, round, and reactive to light.  Neck: Normal range of motion. Neck supple.  Cardiovascular: Normal rate, regular rhythm and normal heart sounds.   Pulmonary/Chest: Effort normal and breath sounds normal.  Abdominal: Soft. Bowel sounds are normal.  Musculoskeletal: Normal range of motion.  Neurological: He is alert and oriented to person, place, and time. No cranial nerve deficit.  Skin: Skin is warm  and dry.      Assessment & Plan:  Otitis media with effusion, bilateral - Plan: fluticasone (FLONASE) 50 MCG/ACT nasal spray   Allegra OTC, increase H20, allergy hygiene explained.  Vertigo - Plan: meclizine (ANTIVERT) 25 MG tablet  Hypertension- monitor at home- possibly from OTC meds, follow up in above 140/80

## 2013-09-20 NOTE — Patient Instructions (Addendum)
Meclizine is AS NEEDED for dizziness See below  Nasal congestion - Decongestants are good for nasal congestion- if you feel very stuffy but no mucus is coming out, this is the medication that will help you the most.  Pseudoephedrine is a decongestant that can improve nasal congestion. Although a prescription is not required, drugstores in the [place] keep pseudoephedrine behind the counter, so it must be requested from a pharmacist. If you have a heart condition or high blood pressure please use Coricidin BPH instead.   Runny nose - Antihistamines such as diphenhydramine (Benadryl), certazine (Zyrtec) which are best taking at night because they can make you tired OR loratadine (Claritin),  fexafinadine (Allegra) help with a runny nose.   Nasal sprays such an oxymetazoline (Afrin and others) may also give temporary relief of nasal congestion. However, these sprays should never be used for more than two to three days; use for more than three days use can worsen congestion.  Nasocort is now over the counter and can help decrease a runny nose. Please stop the medication if you have blurry vision or nose bleeds.     Serous Otitis Media  Serous otitis media is fluid in the middle ear space. This space contains the bones for hearing and air. Air in the middle ear space helps to transmit sound.  The air gets there through the eustachian tube. This tube goes from the back of the nose (nasopharynx) to the middle ear space. It keeps the pressure in the middle ear the same as the outside world. It also helps to drain fluid from the middle ear space. CAUSES  Serous otitis media occurs when the eustachian tube gets blocked. Blockage can come from:  Ear infections.  Colds and other upper respiratory infections.  Allergies.  Irritants such as cigarette smoke.  Sudden changes in air pressure (such as descending in an airplane).  Enlarged adenoids.  A mass in the nasopharynx. During colds and upper  respiratory infections, the middle ear space can become temporarily filled with fluid. This can happen after an ear infection also. Once the infection clears, the fluid will generally drain out of the ear through the eustachian tube. If it does not, then serous otitis media occurs. SIGNS AND SYMPTOMS   Hearing loss.  A feeling of fullness in the ear, without pain.  Young children may not show any symptoms but may show slight behavioral changes, such as agitation, ear pulling, or crying. DIAGNOSIS  Serous otitis media is diagnosed by an ear exam. Tests may be done to check on the movement of the eardrum. Hearing exams may also be done. TREATMENT  The fluid most often goes away without treatment. If allergy is the cause, allergy treatment may be helpful. Fluid that persists for several months may require minor surgery. A small tube is placed in the eardrum to:  Drain the fluid.  Restore the air in the middle ear space. In certain situations, antibiotics are used to avoid surgery. Surgery may be done to remove enlarged adenoids (if this is the cause). HOME CARE INSTRUCTIONS   Keep children away from tobacco smoke.  Be sure to keep any follow-up appointments. SEEK MEDICAL CARE IF:   Your hearing is not better in 3 months.  Your hearing is worse.  You have ear pain.  You have drainage from the ear.  You have dizziness.  You have serous otitis media only in one ear or have any bleeding from your nose (epistaxis).  You notice a  lump on your neck. MAKE SURE YOU:  Understand these instructions.   Will watch your condition.   Will get help right away if you are not doing well or get worse.  Document Released: 11/19/2003 Document Revised: 05/01/2013 Document Reviewed: 03/26/2013 [facility] Patient Information 2014 [place], [place]. Vertigo Vertigo means you feel like you are moving when you are not. Vertigo can make you feel like things around you are moving when they are not.  This problem often goes away on its own.  HOME CARE   Follow your doctor's instructions.  Avoid driving.  Avoid using heavy machinery.  Avoid doing any activity that could be dangerous if you have a vertigo attack.  Tell your doctor if a medicine seems to cause your vertigo. GET HELP RIGHT AWAY IF:   Your medicines do not help or make you feel worse.  You have trouble talking or walking.  You feel weak or have trouble using your arms, hands, or legs.  You have bad headaches.  You keep feeling sick to your stomach (nauseous) or throwing up (vomiting).  Your vision changes.  A family member notices changes in your behavior.  Your problems get worse. MAKE SURE YOU:  Understand these instructions.  Will watch your condition.  Will get help right away if you are not doing well or get worse. Document Released: 06/07/2008 Document Revised: 11/21/2011 Document Reviewed: 03/17/2011 [facility] Patient Information 2014 [place].

## 2013-11-04 ENCOUNTER — Other Ambulatory Visit: Payer: Self-pay | Admitting: Internal Medicine

## 2013-11-13 ENCOUNTER — Ambulatory Visit (INDEPENDENT_AMBULATORY_CARE_PROVIDER_SITE_OTHER): Payer: 59 | Admitting: Emergency Medicine

## 2013-11-13 ENCOUNTER — Encounter: Payer: Self-pay | Admitting: Emergency Medicine

## 2013-11-13 VITALS — BP 122/70 | HR 60 | Temp 98.2°F | Resp 18 | Ht 73.5 in | Wt 202.0 lb

## 2013-11-13 DIAGNOSIS — E782 Mixed hyperlipidemia: Secondary | ICD-10-CM

## 2013-11-13 DIAGNOSIS — M549 Dorsalgia, unspecified: Secondary | ICD-10-CM

## 2013-11-13 DIAGNOSIS — M25551 Pain in right hip: Secondary | ICD-10-CM

## 2013-11-13 DIAGNOSIS — R7309 Other abnormal glucose: Secondary | ICD-10-CM

## 2013-11-13 DIAGNOSIS — Z79899 Other long term (current) drug therapy: Secondary | ICD-10-CM

## 2013-11-13 LAB — HEPATIC FUNCTION PANEL
ALT: 52 U/L (ref 0–53)
AST: 33 U/L (ref 0–37)
Albumin: 4.2 g/dL (ref 3.5–5.2)
Alkaline Phosphatase: 66 U/L (ref 39–117)
Bilirubin, Direct: 0.1 mg/dL (ref 0.0–0.3)
Indirect Bilirubin: 0.6 mg/dL (ref 0.2–1.2)
TOTAL PROTEIN: 6.9 g/dL (ref 6.0–8.3)
Total Bilirubin: 0.7 mg/dL (ref 0.2–1.2)

## 2013-11-13 LAB — LIPID PANEL
CHOLESTEROL: 131 mg/dL (ref 0–200)
HDL: 36 mg/dL — ABNORMAL LOW (ref >39)
LDL Cholesterol: 70 mg/dL (ref 0–99)
TRIGLYCERIDES: 125 mg/dL (ref <150)
Total CHOL/HDL Ratio: 3.6 Ratio
VLDL: 25 mg/dL (ref 0–40)

## 2013-11-13 LAB — BASIC METABOLIC PANEL WITH GFR
BUN: 13 mg/dL (ref 6–23)
CHLORIDE: 106 meq/L (ref 96–112)
CO2: 27 meq/L (ref 19–32)
CREATININE: 0.94 mg/dL (ref 0.50–1.35)
Calcium: 9.3 mg/dL (ref 8.4–10.5)
GFR, Est African American: >89 mL/min
GFR, Est Non African American: 86 mL/min
Glucose, Bld: 81 mg/dL (ref 70–99)
Potassium: 4.3 mEq/L (ref 3.5–5.3)
Sodium: 141 mEq/L (ref 135–145)

## 2013-11-13 LAB — CBC WITH DIFFERENTIAL/PLATELET
BASOS PCT: 1 % (ref 0–1)
Basophils Absolute: 0 10*3/uL (ref 0.0–0.1)
EOS ABS: 0.1 10*3/uL (ref 0.0–0.7)
Eosinophils Relative: 4 % (ref 0–5)
HEMATOCRIT: 45.1 % (ref 39.0–52.0)
HEMOGLOBIN: 15.8 g/dL (ref 13.0–17.0)
Lymphocytes Relative: 44 % (ref 12–46)
Lymphs Abs: 1.5 10*3/uL (ref 0.7–4.0)
MCH: 32.6 pg (ref 26.0–34.0)
MCHC: 35 g/dL (ref 30.0–36.0)
MCV: 93.2 fL (ref 78.0–100.0)
MONOS PCT: 11 % (ref 3–12)
Monocytes Absolute: 0.4 10*3/uL (ref 0.1–1.0)
NEUTROS ABS: 1.4 10*3/uL — AB (ref 1.7–7.7)
Neutrophils Relative %: 40 % — ABNORMAL LOW (ref 43–77)
Platelets: 261 10*3/uL (ref 150–400)
RBC: 4.84 MIL/uL (ref 4.22–5.81)
RDW: 14 % (ref 11.5–15.5)
WBC: 3.4 10*3/uL — ABNORMAL LOW (ref 4.0–10.5)

## 2013-11-13 NOTE — Progress Notes (Addendum)
Subjective:    Patient ID: [name], male    DOB: 1950/12/03, 63 y.o.   MRN: [id number]  HPI Comments: 63 yo male presents for 3 month F/U for Cholesterol, Pre-Dm, D. Deficient. He changed RX CHOL to Atorvastatin at last OV. He is eating descent. He is exercising 2-4 times a week. He has decreased exercise because of left hip joint/ low back. He notes it feels tight ache that does radiate down right leg a little. He sees ortho today for shoulder recheck after cortisone shot last month. CHOL         184   08/13/2013 HDL           41   08/13/2013 LDLCALC       91   08/13/2013 TRIG         261   08/13/2013 CHOLHDL      4.5   08/13/2013 ALT           53   08/13/2013 AST           30   08/13/2013 ALKPHOS       73   08/13/2013 BILITOT      0.3   08/13/2013 CREATININE     1.02   08/13/2013 BUN              14   08/13/2013 NA              136   08/13/2013 K               4.2   08/13/2013 CL              104   08/13/2013 CO2              25   08/13/2013 WBC      3.8   08/13/2013 HGB     16.2   08/13/2013 HCT     46.1   08/13/2013 MCV     93.3   08/13/2013 PLT      250   08/13/2013 HGBA1C      5.8   08/13/2013       Medication List       This list is accurate as of: 11/13/13 11:41 AM.  Always use your most recent med list.               atorvastatin 80 MG tablet  Commonly known as:  LIPITOR  Take 1 tablet (80 mg total) by mouth daily. For cholesterol     Cyanocobalamin 1000 MCG Tbcr  Take 1 tablet (1,000 mcg total) by mouth every morning.     diclofenac 75 MG EC tablet  Commonly known as:  VOLTAREN  TAKE 1 TABLET BY MOUTH TWICE A DAY     Flax Seed Oil 1000 MG Caps  Take 1 capsule (1,000 mg total) by mouth 2 (two) times daily.     fluticasone 50 MCG/ACT nasal spray  Commonly known as:  FLONASE  Place 1 spray into both nostrils daily.     loratadine 10 MG tablet  Commonly known as:  CLARITIN  Take 10 mg by mouth as needed.     Magnesium 250 MG Tabs  Take 250 mg by mouth daily.      omeprazole 20 MG capsule  Commonly known as:  PRILOSEC  Take 20 mg by mouth 2 (two) times daily before a meal.     tamsulosin 0.4 MG Caps capsule  Commonly known as:  FLOMAX  Take 0.4 mg by mouth daily before breakfast.     testosterone cypionate 200 MG/ML injection  Commonly known as:  DEPOTESTOTERONE CYPIONATE  Inject 400 mg into the muscle every 28 (twenty-eight) days.     Vitamin D 2000 UNITS tablet  Take 4,000 Units by mouth daily. Take 4,000 units daily.       Allergies  Allergen Reactions  . Salicylates Rash  . Aspirin     REACTION: rash   Past Medical History  Diagnosis Date  . Seasonal allergies   . GERD (gastroesophageal reflux disease)   . Hyperlipidemia   . BPH (benign prostatic hyperplasia)   . Hypogonadism male   . ED (erectile dysfunction)   . Pre-diabetes     Review of Systems  Musculoskeletal: Positive for arthralgias and back pain.  All other systems reviewed and are negative.   BP 122/70  Pulse 60  Temp(Src) 98.2 F (36.8 C) (Temporal)  Resp 18  Ht 6' 1.5" (1.867 m)  Wt 202 lb (91.627 kg)  BMI 26.29 kg/m2     Objective:   Physical Exam  Nursing note and vitals reviewed. Constitutional: He is oriented to person, place, and time. He appears well-developed and well-nourished.  HENT:  Head: Normocephalic and atraumatic.  Right Ear: External ear normal.  Left Ear: External ear normal.  Nose: Nose normal.  Eyes: Conjunctivae and EOM are normal.  Neck: Normal range of motion. Neck supple. No JVD present. No thyromegaly present.  Cardiovascular: Normal rate, regular rhythm, normal heart sounds and intact distal pulses.   Pulmonary/Chest: Effort normal and breath sounds normal.  Abdominal: Soft. Bowel sounds are normal. He exhibits no distension and no mass. There is no tenderness. There is no rebound and no guarding.  Musculoskeletal: Normal range of motion. He exhibits no edema and no tenderness.  Negative exam but points/ notes to L5/s1  right side discomfort when it occurs  Lymphadenopathy:    He has no cervical adenopathy.  Neurological: He is alert and oriented to person, place, and time. He has normal reflexes. No cranial nerve deficit. Coordination normal.  Skin: Skin is warm and dry.  Psychiatric: He has a normal mood and affect. His behavior is normal. Judgment and thought content normal.          Assessment & Plan:  1.  3 month F/U for Cholesterol, Pre-Dm, D. Deficient. Needs healthy diet, cardio QD and obtain healthy weight. Check Labs, Check BP if >130/80 call office 2. Hip right/ back pain probably sciatica- Keep ortho apt, w/c if needs xray, change wallet to front, heat/ stretch/ ice explained  SX Viagra 50 mg #3 take AD

## 2013-11-13 NOTE — Patient Instructions (Addendum)
We want weight loss that will last so you should lose 1-2 pounds a week.  THAT IS IT! Please pick THREE things a month to change. Once it is a habit check off the item. Then pick another three items off the list to become habits.  If you are already doing a habit on the list GREAT!  Cross that item off! o Don't drink your calories. Ie, alcohol, soda, fruit juice, and sweet tea.  o Drink more water. Drink a glass when you feel hungry or before each meal.  o Eat breakfast - Complex carb and protein (likeDannon light and fit yogurt, oatmeal, fruit, eggs, [place] bacon). o Measure your cereal.  Eat no more than one cup a day. (ie [place]) o Eat an apple a day. o Add a vegetable a day. o Try a new vegetable a month. o Use Pam! Stop using oil or butter to cook. o Don't finish your plate or use smaller plates. o Share your dessert. o Eat sugar free Jello for dessert or frozen grapes. o Don't eat 2-3 hours before bed. o Switch to whole wheat bread, pasta, and brown rice. o Make healthier choices when you eat out. No fries! o Pick baked chicken, NOT fried. o Don't forget to SLOW DOWN when you eat. It is not going anywhere.  o Take the stairs. o Park far away in the parking lot o [employer] (or weights) for 10 minutes while watching TV. o Walk at work for 10 minutes during break. o Walk outside 1 time a week with your friend, kids, dog, or significant other. o Start a walking group at [place] the mall as much as you can tolerate.  o Keep a food diary. o Weigh yourself daily. o Walk for 15 minutes 3 days per week. o Cook at home more often and eat out less.  If life happens and you go back to old habits, it is okay.  Just start over. You can do it!   If you experience chest pain, get short of breath, or tired during the exercise, please stop immediately and inform your doctor.    Bad carbs also include fruit juice, alcohol, and sweet tea. These are empty calories that do not signal to  your brain that you are full.   Please remember the good carbs are still carbs which convert into sugar. So please measure them out no more than 1/2-1 cup of rice, oatmeal, pasta, and beans.  Veggies are however free foods! Pile them on.   I like lean protein at every meal such as chicken, [place], pork chops, cottage cheese, etc. Just do not fry these meats and please center your meal around vegetable, the meats should be a side dish.   No all fruit is created equal. Please see the list below, the fruit at the bottom is higher in sugars than the fruit at the top   Sciatica with Rehab The sciatic nerve runs from the back down the leg and is responsible for sensation and control of the muscles in the back (posterior) side of the thigh, lower leg, and foot. Sciatica is a condition that is characterized by inflammation of this nerve.  SYMPTOMS   Signs of nerve damage, including numbness and/or weakness along the posterior side of the lower extremity.  Pain in the back of the thigh that may also travel down the leg.  Pain that worsens when sitting for long periods of time.  Occasionally, pain in the  back or buttock. CAUSES  Inflammation of the sciatic nerve is the cause of sciatica. The inflammation is due to something irritating the nerve. Common sources of irritation include:  Sitting for long periods of time.  Direct trauma to the nerve.  Arthritis of the spine.  Herniated or ruptured disk.  Slipping of the vertebrae (spondylolithesis)  Pressure from soft tissues, such as muscles or ligament-like tissue (fascia). RISK INCREASES WITH:  Sports that place pressure or stress on the spine (football or weightlifting).  Poor strength and flexibility.  Failure to warm-up properly before activity.  Family history of low back pain or disk disorders.  Previous back injury or surgery.  Poor body mechanics, especially when lifting, or poor posture. PREVENTION   Warm up and stretch  properly before activity.  Maintain physical fitness:  Strength, flexibility, and endurance.  Cardiovascular fitness.  Learn and use proper technique, especially with posture and lifting. When possible, have coach correct improper technique.  Avoid activities that place stress on the spine. PROGNOSIS If treated properly, then sciatica usually resolves within 6 weeks. However, occasionally surgery is necessary.  RELATED COMPLICATIONS   Permanent nerve damage, including pain, numbness, tingle, or weakness.  Chronic back pain.  Risks of surgery: infection, bleeding, nerve damage, or damage to surrounding tissues. TREATMENT Treatment initially involves resting from any activities that aggravate your symptoms. The use of ice and medication may help reduce pain and inflammation. The use of strengthening and stretching exercises may help reduce pain with activity. These exercises may be performed at home or with referral to a therapist. A therapist may recommend further treatments, such as transcutaneous electronic nerve stimulation (TENS) or ultrasound. Your caregiver may recommend corticosteroid injections to help reduce inflammation of the sciatic nerve. If symptoms persist despite non-surgical (conservative) treatment, then surgery may be recommended. MEDICATION  If pain medication is necessary, then nonsteroidal anti-inflammatory medications, such as aspirin and ibuprofen, or other minor pain relievers, such as acetaminophen, are often recommended.  Do not take pain medication for 7 days before surgery.  Prescription pain relievers may be given if deemed necessary by your caregiver. Use only as directed and only as much as you need.  Ointments applied to the skin may be helpful.  Corticosteroid injections may be given by your caregiver. These injections should be reserved for the most serious cases, because they may only be given a certain number of times. HEAT AND COLD  Cold  treatment (icing) relieves pain and reduces inflammation. Cold treatment should be applied for 10 to 15 minutes every 2 to 3 hours for inflammation and pain and immediately after any activity that aggravates your symptoms. Use ice packs or massage the area with a piece of ice (ice massage).  Heat treatment may be used prior to performing the stretching and strengthening activities prescribed by your caregiver, physical therapist, or athletic trainer. Use a heat pack or soak the injury in warm water. SEEK MEDICAL CARE IF:  Treatment seems to offer no benefit, or the condition worsens.  Any medications produce adverse side effects. EXERCISES  RANGE OF MOTION (ROM) AND STRETCHING EXERCISES - Sciatica Most people with sciatic will find that their symptoms worsen with either excessive bending forward (flexion) or arching at the low back (extension). The exercises which will help resolve your symptoms will focus on the opposite motion. Your physician, physical therapist or athletic trainer will help you determine which exercises will be most helpful to resolve your low back pain. Do not complete any exercises  without first consulting with your clinician. Discontinue any exercises which worsen your symptoms until you speak to your clinician. If you have pain, numbness or tingling which travels down into your buttocks, leg or foot, the goal of the therapy is for these symptoms to move closer to your back and eventually resolve. Occasionally, these leg symptoms will get better, but your low back pain may worsen; this is typically an indication of progress in your rehabilitation. Be certain to be very alert to any changes in your symptoms and the activities in which you participated in the 24 hours prior to the change. Sharing this information with your clinician will allow him/her to most efficiently treat your condition. These exercises may help you when beginning to rehabilitate your injury. Your symptoms may  resolve with or without further involvement from your physician, physical therapist or athletic trainer. While completing these exercises, remember:   Restoring tissue flexibility helps normal motion to return to the joints. This allows healthier, less painful movement and activity.  An effective stretch should be held for at least 30 seconds.  A stretch should never be painful. You should only feel a gentle lengthening or release in the stretched tissue. FLEXION RANGE OF MOTION AND STRETCHING EXERCISES: STRETCH  Flexion, Single Knee to Chest   Lie on a firm bed or floor with both legs extended in front of you.  Keeping one leg in contact with the floor, bring your opposite knee to your chest. Hold your leg in place by either grabbing behind your thigh or at your knee.  Pull until you feel a gentle stretch in your low back. Hold __________ seconds.  Slowly release your grasp and repeat the exercise with the opposite side. Repeat __________ times. Complete this exercise __________ times per day.  STRETCH  Flexion, Double Knee to Chest  Lie on a firm bed or floor with both legs extended in front of you.  Keeping one leg in contact with the floor, bring your opposite knee to your chest.  Tense your stomach muscles to support your back and then lift your other knee to your chest. Hold your legs in place by either grabbing behind your thighs or at your knees.  Pull both knees toward your chest until you feel a gentle stretch in your low back. Hold __________ seconds.  Tense your stomach muscles and slowly return one leg at a time to the floor. Repeat __________ times. Complete this exercise __________ times per day.  STRETCH  Low Trunk Rotation   Lie on a firm bed or floor. Keeping your legs in front of you, bend your knees so they are both pointed toward the ceiling and your feet are flat on the floor.  Extend your arms out to the side. This will stabilize your upper body by keeping  your shoulders in contact with the floor.  Gently and slowly drop both knees together to one side until you feel a gentle stretch in your low back. Hold for __________ seconds.  Tense your stomach muscles to support your low back as you bring your knees back to the starting position. Repeat the exercise to the other side. Repeat __________ times. Complete this exercise __________ times per day  EXTENSION RANGE OF MOTION AND FLEXIBILITY EXERCISES: STRETCH  Extension, Prone on Elbows  Lie on your stomach on the floor, a bed will be too soft. Place your palms about shoulder width apart and at the height of your head.  Place your elbows under your  shoulders. If this is too painful, stack pillows under your chest.  Allow your body to relax so that your hips drop lower and make contact more completely with the floor.  Hold this position for __________ seconds.  Slowly return to lying flat on the floor. Repeat __________ times. Complete this exercise __________ times per day.  RANGE OF MOTION  Extension, Prone Press Ups  Lie on your stomach on the floor, a bed will be too soft. Place your palms about shoulder width apart and at the height of your head.  Keeping your back as relaxed as possible, slowly straighten your elbows while keeping your hips on the floor. You may adjust the placement of your hands to maximize your comfort. As you gain motion, your hands will come more underneath your shoulders.  Hold this position __________ seconds.  Slowly return to lying flat on the floor. Repeat __________ times. Complete this exercise __________ times per day.  STRENGTHENING EXERCISES - Sciatica  These exercises may help you when beginning to rehabilitate your injury. These exercises should be done near your "sweet spot." This is the neutral, low-back arch, somewhere between fully rounded and fully arched, that is your least painful position. When performed in this safe range of motion, these  exercises can be used for people who have either a flexion or extension based injury. These exercises may resolve your symptoms with or without further involvement from your physician, physical therapist or athletic trainer. While completing these exercises, remember:   Muscles can gain both the endurance and the strength needed for everyday activities through controlled exercises.  Complete these exercises as instructed by your physician, physical therapist or athletic trainer. Progress with the resistance and repetition exercises only as your caregiver advises.  You may experience muscle soreness or fatigue, but the pain or discomfort you are trying to eliminate should never worsen during these exercises. If this pain does worsen, stop and make certain you are following the directions exactly. If the pain is still present after adjustments, discontinue the exercise until you can discuss the trouble with your clinician. STRENGTHENING Deep Abdominals, Pelvic Tilt   Lie on a firm bed or floor. Keeping your legs in front of you, bend your knees so they are both pointed toward the ceiling and your feet are flat on the floor.  Tense your lower abdominal muscles to press your low back into the floor. This motion will rotate your pelvis so that your tail bone is scooping upwards rather than pointing at your feet or into the floor.  With a gentle tension and even breathing, hold this position for __________ seconds. Repeat __________ times. Complete this exercise __________ times per day.  STRENGTHENING  Abdominals, Crunches   Lie on a firm bed or floor. Keeping your legs in front of you, bend your knees so they are both pointed toward the ceiling and your feet are flat on the floor. Cross your arms over your chest.  Slightly tip your chin down without bending your neck.  Tense your abdominals and slowly lift your trunk high enough to just clear your shoulder blades. Lifting higher can put excessive  stress on the low back and does not further strengthen your abdominal muscles.  Control your return to the starting position. Repeat __________ times. Complete this exercise __________ times per day.  STRENGTHENING  Quadruped, Opposite UE/LE Lift  Assume a hands and knees position on a firm surface. Keep your hands under your shoulders and your knees under your  hips. You may place padding under your knees for comfort.  Find your neutral spine and gently tense your abdominal muscles so that you can maintain this position. Your shoulders and hips should form a rectangle that is parallel with the floor and is not twisted.  Keeping your trunk steady, lift your right hand no higher than your shoulder and then your left leg no higher than your hip. Make sure you are not holding your breath. Hold this position __________ seconds.  Continuing to keep your abdominal muscles tense and your back steady, slowly return to your starting position. Repeat with the opposite arm and leg. Repeat __________ times. Complete this exercise __________ times per day.  STRENGTHENING  Abdominals and Quadriceps, Straight Leg Raise   Lie on a firm bed or floor with both legs extended in front of you.  Keeping one leg in contact with the floor, bend the other knee so that your foot can rest flat on the floor.  Find your neutral spine, and tense your abdominal muscles to maintain your spinal position throughout the exercise.  Slowly lift your straight leg off the floor about 6 inches for a count of 15, making sure to not hold your breath.  Still keeping your neutral spine, slowly lower your leg all the way to the floor. Repeat this exercise with each leg __________ times. Complete this exercise __________ times per day. POSTURE AND BODY MECHANICS CONSIDERATIONS - Sciatica Keeping correct posture when sitting, standing or completing your activities will reduce the stress put on different body tissues, allowing injured  tissues a chance to heal and limiting painful experiences. The following are general guidelines for improved posture. Your physician or physical therapist will provide you with any instructions specific to your needs. While reading these guidelines, remember:  The exercises prescribed by your provider will help you have the flexibility and strength to maintain correct postures.  The correct posture provides the optimal environment for your joints to work. All of your joints have less wear and tear when properly supported by a spine with good posture. This means you will experience a healthier, less painful body.  Correct posture must be practiced with all of your activities, especially prolonged sitting and standing. Correct posture is as important when doing repetitive low-stress activities (typing) as it is when doing a single heavy-load activity (lifting). RESTING POSITIONS Consider which positions are most painful for you when choosing a resting position. If you have pain with flexion-based activities (sitting, bending, stooping, squatting), choose a position that allows you to rest in a less flexed posture. You would want to avoid curling into a fetal position on your side. If your pain worsens with extension-based activities (prolonged standing, working overhead), avoid resting in an extended position such as sleeping on your stomach. Most people will find more comfort when they rest with their spine in a more neutral position, neither too rounded nor too arched. Lying on a non-sagging bed on your side with a pillow between your knees, or on your back with a pillow under your knees will often provide some relief. Keep in mind, being in any one position for a prolonged period of time, no matter how correct your posture, can still lead to stiffness. PROPER SITTING POSTURE In order to minimize stress and discomfort on your spine, you must sit with correct posture Sitting with good posture should be  effortless for a healthy body. Returning to good posture is a gradual process. Many people can work toward this  most comfortably by using various supports until they have the flexibility and strength to maintain this posture on their own. When sitting with proper posture, your ears will fall over your shoulders and your shoulders will fall over your hips. You should use the back of the chair to support your upper back. Your low back will be in a neutral position, just slightly arched. You may place a small pillow or folded towel at the base of your low back for support.  When working at a desk, create an environment that supports good, upright posture. Without extra support, muscles fatigue and lead to excessive strain on joints and other tissues. Keep these recommendations in mind: CHAIR:   A chair should be able to slide under your desk when your back makes contact with the back of the chair. This allows you to work closely.  The chair's height should allow your eyes to be level with the upper part of your monitor and your hands to be slightly lower than your elbows. BODY POSITION  Your feet should make contact with the floor. If this is not possible, use a foot rest.  Keep your ears over your shoulders. This will reduce stress on your neck and low back. INCORRECT SITTING POSTURES   If you are feeling tired and unable to assume a healthy sitting posture, do not slouch or slump. This puts excessive strain on your back tissues, causing more damage and pain. Healthier options include:  Using more support, like a lumbar pillow.  Switching tasks to something that requires you to be upright or walking.  Talking a brief walk.  Lying down to rest in a neutral-spine position. PROLONGED STANDING WHILE SLIGHTLY LEANING FORWARD  When completing a task that requires you to lean forward while standing in one place for a long time, place either foot up on a stationary 2-4 inch high object to help  maintain the best posture. When both feet are on the ground, the low back tends to lose its slight inward curve. If this curve flattens (or becomes too large), then the back and your other joints will experience too much stress, fatigue more quickly and can cause pain.  CORRECT STANDING POSTURES Proper standing posture should be assumed with all daily activities, even if they only take a few moments, like when brushing your teeth. As in sitting, your ears should fall over your shoulders and your shoulders should fall over your hips. You should keep a slight tension in your abdominal muscles to brace your spine. Your tailbone should point down to the ground, not behind your body, resulting in an over-extended swayback posture.  INCORRECT STANDING POSTURES  Common incorrect standing postures include a forward head, locked knees and/or an excessive swayback. WALKING Walk with an upright posture. Your ears, shoulders and hips should all line-up. PROLONGED ACTIVITY IN A FLEXED POSITION When completing a task that requires you to bend forward at your waist or lean over a low surface, try to find a way to stabilize 3 of 4 of your limbs. You can place a hand or elbow on your thigh or rest a knee on the surface you are reaching across. This will provide you more stability so that your muscles do not fatigue as quickly. By keeping your knees relaxed, or slightly bent, you will also reduce stress across your low back. CORRECT LIFTING TECHNIQUES DO :   Assume a wide stance. This will provide you more stability and the opportunity to get as close as  possible to the object which you are lifting.  Tense your abdominals to brace your spine; then bend at the knees and hips. Keeping your back locked in a neutral-spine position, lift using your leg muscles. Lift with your legs, keeping your back straight.  Test the weight of unknown objects before attempting to lift them.  Try to keep your elbows locked down at your  sides in order get the best strength from your shoulders when carrying an object.  Always ask for help when lifting heavy or awkward objects. INCORRECT LIFTING TECHNIQUES DO NOT:   Lock your knees when lifting, even if it is a small object.  Bend and twist. Pivot at your feet or move your feet when needing to change directions.  Assume that you cannot safely pick up a paperclip without proper posture. Document Released: 08/29/2005 Document Revised: 11/21/2011 Document Reviewed: 12/11/2008 [facility] Patient Information 2014 [place], [place].

## 2013-11-14 LAB — INSULIN, FASTING: Insulin fasting, serum: 8 u[IU]/mL (ref 3–28)

## 2013-11-14 LAB — HEMOGLOBIN A1C
Hgb A1c MFr Bld: 5.7 % — ABNORMAL HIGH (ref <5.7)
Mean Plasma Glucose: 117 mg/dL — ABNORMAL HIGH (ref <117)

## 2013-12-31 ENCOUNTER — Other Ambulatory Visit: Payer: Self-pay | Admitting: Emergency Medicine

## 2014-01-29 ENCOUNTER — Other Ambulatory Visit: Payer: Self-pay | Admitting: Emergency Medicine

## 2014-01-29 MED ORDER — OMEPRAZOLE 20 MG PO CPDR: 20.0000 mg | DELAYED_RELEASE_CAPSULE | Freq: Two times a day (BID) | ORAL | Status: DC

## 2014-02-12 ENCOUNTER — Encounter: Payer: Self-pay | Admitting: Physician Assistant

## 2014-02-12 ENCOUNTER — Ambulatory Visit (INDEPENDENT_AMBULATORY_CARE_PROVIDER_SITE_OTHER): Payer: 59 | Admitting: Physician Assistant

## 2014-02-12 VITALS — BP 120/78 | HR 68 | Temp 98.1°F | Resp 16 | Ht 73.5 in | Wt 208.0 lb

## 2014-02-12 DIAGNOSIS — R7309 Other abnormal glucose: Secondary | ICD-10-CM

## 2014-02-12 DIAGNOSIS — E782 Mixed hyperlipidemia: Secondary | ICD-10-CM

## 2014-02-12 DIAGNOSIS — I10 Essential (primary) hypertension: Secondary | ICD-10-CM

## 2014-02-12 DIAGNOSIS — E559 Vitamin D deficiency, unspecified: Secondary | ICD-10-CM

## 2014-02-12 DIAGNOSIS — Z79899 Other long term (current) drug therapy: Secondary | ICD-10-CM

## 2014-02-12 LAB — BASIC METABOLIC PANEL WITH GFR
BUN: 13 mg/dL (ref 6–23)
CALCIUM: 9.2 mg/dL (ref 8.4–10.5)
CO2: 25 meq/L (ref 19–32)
Chloride: 105 mEq/L (ref 96–112)
Creat: 0.89 mg/dL (ref 0.50–1.35)
GFR, Est Non African American: >89 mL/min
GLUCOSE: 76 mg/dL (ref 70–99)
Potassium: 4.4 mEq/L (ref 3.5–5.3)
Sodium: 138 mEq/L (ref 135–145)

## 2014-02-12 LAB — CBC WITH DIFFERENTIAL/PLATELET
BASOS PCT: 1 % (ref 0–1)
Basophils Absolute: 0 10*3/uL (ref 0.0–0.1)
EOS ABS: 0.1 10*3/uL (ref 0.0–0.7)
EOS PCT: 2 % (ref 0–5)
HCT: 43.1 % (ref 39.0–52.0)
HEMOGLOBIN: 15.1 g/dL (ref 13.0–17.0)
LYMPHS ABS: 1.4 10*3/uL (ref 0.7–4.0)
Lymphocytes Relative: 32 % (ref 12–46)
MCH: 32.5 pg (ref 26.0–34.0)
MCHC: 35 g/dL (ref 30.0–36.0)
MCV: 92.9 fL (ref 78.0–100.0)
MONO ABS: 0.5 10*3/uL (ref 0.1–1.0)
MONOS PCT: 11 % (ref 3–12)
Neutro Abs: 2.3 10*3/uL (ref 1.7–7.7)
Neutrophils Relative %: 54 % (ref 43–77)
Platelets: 237 10*3/uL (ref 150–400)
RBC: 4.64 MIL/uL (ref 4.22–5.81)
RDW: 14.2 % (ref 11.5–15.5)
WBC: 4.3 10*3/uL (ref 4.0–10.5)

## 2014-02-12 LAB — HEPATIC FUNCTION PANEL
ALBUMIN: 3.9 g/dL (ref 3.5–5.2)
ALT: 54 U/L — AB (ref 0–53)
AST: 31 U/L (ref 0–37)
Alkaline Phosphatase: 99 U/L (ref 39–117)
BILIRUBIN DIRECT: 0.1 mg/dL (ref 0.0–0.3)
Indirect Bilirubin: 0.3 mg/dL (ref 0.2–1.2)
TOTAL PROTEIN: 7.1 g/dL (ref 6.0–8.3)
Total Bilirubin: 0.4 mg/dL (ref 0.2–1.2)

## 2014-02-12 LAB — LIPID PANEL
CHOLESTEROL: 141 mg/dL (ref 0–200)
HDL: 32 mg/dL — ABNORMAL LOW (ref >39)
LDL Cholesterol: 67 mg/dL (ref 0–99)
Total CHOL/HDL Ratio: 4.4 Ratio
Triglycerides: 212 mg/dL — ABNORMAL HIGH (ref <150)
VLDL: 42 mg/dL — AB (ref 0–40)

## 2014-02-12 LAB — MAGNESIUM: Magnesium: 1.9 mg/dL (ref 1.5–2.5)

## 2014-02-12 LAB — TSH: TSH: 4.074 u[IU]/mL (ref 0.350–4.500)

## 2014-02-12 LAB — HEMOGLOBIN A1C
HEMOGLOBIN A1C: 5.7 % — AB (ref <5.7)
MEAN PLASMA GLUCOSE: 117 mg/dL — AB (ref <117)

## 2014-02-12 NOTE — Patient Instructions (Signed)

## 2014-02-12 NOTE — Progress Notes (Signed)
Assessment and Plan:  Hypertension: Continue medication, monitor blood pressure at home. Continue DASH diet. Cholesterol: Continue diet and exercise. Check cholesterol.  Pre-diabetes-Continue diet and exercise. Check A1C Vitamin D Def- check level and continue medications.  Right hip pain-? From SI/lower back vs hip- given exercises and will follow up with ortho.   Continue diet and meds as discussed. Further disposition pending results of labs.  HPI 63 y.o. male  presents for 3 month follow up with hypertension, hyperlipidemia, prediabetes and vitamin D. His blood pressure has been controlled at home, today their BP is BP: 120/78 mmHg He does not workout since his surgery and since his right hip pain, was walking 2 miles a day. He denies chest pain, shortness of breath, dizziness.  He is on cholesterol medication and denies myalgias. His cholesterol is at goal. The cholesterol last visit was:   Lab Results  Component Value Date   CHOL 131 11/13/2013   HDL 36* 11/13/2013   LDLCALC 70 11/13/2013   TRIG 125 11/13/2013   CHOLHDL 3.6 11/13/2013   He has been working on diet and exercise for prediabetes, and denies paresthesia of the feet, polydipsia and polyuria. Last A1C in the office was:  Lab Results  Component Value Date   HGBA1C 5.7* 11/13/2013   Patient is on Vitamin D supplement.   He is has a history of testosterone deficiency and is on testosterone replacement. He states that the testosterone helps with his energy, libido, muscle mass. He had a left shoulder surgery, rotator cuff repair, in March. He has done PT and he continues to following with his Ortho, Dr. Onnie Graham.  He started to have right hip pain and his ortho gave him exercises. He states it is his right hip with pain some aching down the posterior/lateral leg and paraesthesias in his lower leg. He can lay on that side, states it is worse with walking and states the exercises are worse. He is taking two of the volteran a day.    Current Medications:  Current Outpatient Prescriptions on File Prior to Visit  Medication Sig Dispense Refill  . atorvastatin (LIPITOR) 80 MG tablet Take 1 tablet (80 mg total) by mouth daily. For cholesterol  30 tablet  99  . Cholecalciferol (VITAMIN D) 2000 UNITS tablet Take 4,000 Units by mouth daily. Take 4,000 units daily.      . Cyanocobalamin 1000 MCG TBCR Take 1 tablet (1,000 mcg total) by mouth every morning.    0  . diclofenac (VOLTAREN) 75 MG EC tablet TAKE 1 TABLET BY MOUTH TWICE A DAY  180 tablet  1  . Flaxseed, Linseed, (FLAX SEED OIL) 1000 MG CAPS Take 1 capsule (1,000 mg total) by mouth 2 (two) times daily.    0  . fluticasone (FLONASE) 50 MCG/ACT nasal spray Place 1 spray into both nostrils daily.  16 g  2  . loratadine (CLARITIN) 10 MG tablet Take 10 mg by mouth as needed.       . Magnesium 250 MG TABS Take 250 mg by mouth daily.      Marland Kitchen omeprazole (PRILOSEC) 20 MG capsule Take 1 capsule (20 mg total) by mouth 2 (two) times daily before a meal.  180 capsule  6  . Tamsulosin HCl (FLOMAX) 0.4 MG CAPS Take 0.4 mg by mouth daily before breakfast.       . testosterone cypionate (DEPOTESTOTERONE CYPIONATE) 200 MG/ML injection INJECT 2 MLS IMTRAMUSCULARLY EVERY 2 WEEKS  10 mL  2   No  current facility-administered medications on file prior to visit.   Medical History:  Past Medical History  Diagnosis Date  . Seasonal allergies   . GERD (gastroesophageal reflux disease)   . Hyperlipidemia   . BPH (benign prostatic hyperplasia)   . Hypogonadism male   . ED (erectile dysfunction)   . Pre-diabetes    Allergies:  Allergies  Allergen Reactions  . Salicylates Rash  . Aspirin     REACTION: rash     Review of Systems: [X]  = complains of  [ ]  = denies  General: Fatigue [ ]  Fever [ ]  Chills [ ]  Weakness [ ]   Insomnia [ ]  Eyes: Redness [ ]  Blurred vision [ ]  Diplopia [ ]   ENT: Congestion [ ]  Sinus Pain [ ]  Post Nasal Drip [ ]  Sore Throat [ ]  Earache [ ]   Cardiac: Chest  pain/pressure [ ]  SOB [ ]  Orthopnea [ ]   Palpitations [ ]   Paroxysmal nocturnal dyspnea[ ]  Claudication [ ]  Edema [ ]   Pulmonary: Cough [ ]  Wheezing[ ]   SOB [ ]   Snoring [ ]   GI: Nausea [ ]  Vomiting[ ]  Dysphagia[ ]  Heartburn[ ]  Abdominal pain [ ]  Constipation [ ] ; Diarrhea [ ] ; BRBPR [ ]  Melena[ ]  GU: Hematuria[ ]  Dysuria [ ]  Nocturia[ ]  Urgency [ ]   Hesitancy [ ]  Discharge [ ]  Neuro: Headaches[ ]  Vertigo[ ]  Paresthesias[ ]  Spasm [ ]  Speech changes [ ]  Incoordination [ ]   Ortho: Arthritis [ ]  Joint pain Valu.Nieves ] Muscle pain [ ]  Joint swelling [ ]  Back Pain [ ]  Skin:  Rash [ ]   Pruritis [ ]  Change in skin lesion [ ]   Psych: Depression[ ]  Anxiety[ ]  Confusion [ ]  Memory loss [ ]   Heme/Lypmh: Bleeding [ ]  Bruising [ ]  Enlarged lymph nodes [ ]   Endocrine: Visual blurring [ ]  Paresthesia [ ]  Polyuria [ ]  Polydypsea [ ]    Heat/cold intolerance [ ]  Hypoglycemia [ ]   Family history- Review and unchanged Social history- Review and unchanged Physical Exam: BP 120/78  Pulse 68  Temp(Src) 98.1 F (36.7 C)  Resp 16  Ht 6' 1.5" (1.867 m)  Wt 208 lb (94.348 kg)  BMI 27.07 kg/m2 Wt Readings from Last 3 Encounters:  02/12/14 208 lb (94.348 kg)  11/13/13 202 lb (91.627 kg)  09/20/13 211 lb 6.4 oz (95.89 kg)   General Appearance: Well nourished, in no apparent distress. Eyes: PERRLA, EOMs, conjunctiva no swelling or erythema Sinuses: No Frontal/maxillary tenderness ENT/Mouth: Ext aud canals clear, TMs without erythema, bulging. No erythema, swelling, or exudate on post pharynx.  Tonsils not swollen or erythematous. Hearing normal.  Neck: Supple, thyroid normal.  Respiratory: Respiratory effort normal, BS equal bilaterally without rales, rhonchi, wheezing or stridor.  Cardio: RRR with no MRGs. Brisk peripheral pulses without edema.  Abdomen: Soft, + BS.  Non tender, no guarding, rebound, hernias, masses. Lymphatics: Non tender without lymphadenopathy.  Musculoskeletal: Full ROM, 5/5 strength, normal  gait. Non tender right hip, FROM, no SI tenderness.  Skin: Warm, dry without rashes, lesions, ecchymosis.  Neuro: Cranial nerves intact. Normal muscle tone, no cerebellar symptoms. Sensation intact.  Psych: Awake and oriented X 3, normal affect, Insight and Judgment appropriate.    Vicie Mutters 10:51 AM

## 2014-02-13 LAB — VITAMIN D 25 HYDROXY (VIT D DEFICIENCY, FRACTURES): VIT D 25 HYDROXY: 68 ng/mL (ref 30–89)

## 2014-04-28 ENCOUNTER — Encounter: Payer: Self-pay | Admitting: Emergency Medicine

## 2014-05-27 ENCOUNTER — Ambulatory Visit (INDEPENDENT_AMBULATORY_CARE_PROVIDER_SITE_OTHER): Payer: 59 | Admitting: Emergency Medicine

## 2014-05-27 ENCOUNTER — Encounter: Payer: Self-pay | Admitting: Emergency Medicine

## 2014-05-27 VITALS — BP 146/98 | HR 60 | Temp 98.6°F | Resp 16 | Ht 73.5 in | Wt 206.0 lb

## 2014-05-27 DIAGNOSIS — Z111 Encounter for screening for respiratory tuberculosis: Secondary | ICD-10-CM

## 2014-05-27 DIAGNOSIS — Z1212 Encounter for screening for malignant neoplasm of rectum: Secondary | ICD-10-CM

## 2014-05-27 DIAGNOSIS — Z125 Encounter for screening for malignant neoplasm of prostate: Secondary | ICD-10-CM

## 2014-05-27 DIAGNOSIS — R7309 Other abnormal glucose: Secondary | ICD-10-CM

## 2014-05-27 DIAGNOSIS — Z23 Encounter for immunization: Secondary | ICD-10-CM

## 2014-05-27 DIAGNOSIS — Z0000 Encounter for general adult medical examination without abnormal findings: Secondary | ICD-10-CM

## 2014-05-27 DIAGNOSIS — N529 Male erectile dysfunction, unspecified: Secondary | ICD-10-CM

## 2014-05-27 DIAGNOSIS — R030 Elevated blood-pressure reading, without diagnosis of hypertension: Secondary | ICD-10-CM

## 2014-05-27 DIAGNOSIS — E782 Mixed hyperlipidemia: Secondary | ICD-10-CM

## 2014-05-27 LAB — CBC WITH DIFFERENTIAL/PLATELET
BASOS ABS: 0 10*3/uL (ref 0.0–0.1)
BASOS PCT: 1 % (ref 0–1)
Eosinophils Absolute: 0.1 10*3/uL (ref 0.0–0.7)
Eosinophils Relative: 2 % (ref 0–5)
HCT: 47.6 % (ref 39.0–52.0)
Hemoglobin: 16.8 g/dL (ref 13.0–17.0)
Lymphocytes Relative: 32 % (ref 12–46)
Lymphs Abs: 1.3 10*3/uL (ref 0.7–4.0)
MCH: 33.2 pg (ref 26.0–34.0)
MCHC: 35.3 g/dL (ref 30.0–36.0)
MCV: 94.1 fL (ref 78.0–100.0)
Monocytes Absolute: 0.4 10*3/uL (ref 0.1–1.0)
Monocytes Relative: 10 % (ref 3–12)
NEUTROS PCT: 55 % (ref 43–77)
Neutro Abs: 2.2 10*3/uL (ref 1.7–7.7)
Platelets: 267 10*3/uL (ref 150–400)
RBC: 5.06 MIL/uL (ref 4.22–5.81)
RDW: 14.4 % (ref 11.5–15.5)
WBC: 4 10*3/uL (ref 4.0–10.5)

## 2014-05-27 LAB — HEMOGLOBIN A1C
Hgb A1c MFr Bld: 6 % — ABNORMAL HIGH (ref <5.7)
MEAN PLASMA GLUCOSE: 126 mg/dL — AB (ref <117)

## 2014-05-27 NOTE — Patient Instructions (Signed)

## 2014-05-27 NOTE — Progress Notes (Signed)
Subjective:    Patient ID: Jacob Larsen, male    DOB: 12-Nov-1950, 63 y.o.   MRN: [id number]  HPI Comments: 63 yo AAM CPE and presents for 3 month F/U for elevated BP w/o Hx HTN, Cholesterol, Pre-Dm, D. Deficient. He notes BP is usually good. He is eating healthy for the most part. He is not exercising routinely due to back and hip problems.   HE IS BEING FOLLOWED BY ORTHO FOR RIGHT HIP AND LOW BACK PAIN. HE WAS STARTED on Gabapentin to see if any relief with slow increase. He denies any trauma/ strain to cause problems.   He notes GERD controlled.  He notes continued ED but medication helps with erection. He has had workup with urology in past for ED/ BPH without significant improvement.   WBC             4.3   02/12/2014 HGB            15.1   02/12/2014 HCT            43.1   02/12/2014 PLT             237   02/12/2014 GLUCOSE          76   02/12/2014 CHOL            141   02/12/2014 TRIG            212   02/12/2014 HDL              32   02/12/2014 LDLCALC          67   02/12/2014 ALT              54   02/12/2014 AST              31   02/12/2014 NA              138   02/12/2014 K               4.4   02/12/2014 CL              105   02/12/2014 CREATININE     0.89   02/12/2014 BUN              13   02/12/2014 CO2              25   02/12/2014 TSH           4.074   02/12/2014 HGBA1C          5.7   02/12/2014   Hyperlipidemia Pertinent negatives include no chest pain or shortness of breath.  Gastrophageal Reflux He reports no chest pain. Pertinent negatives include no fatigue.     Medication List       This list is accurate as of: 05/27/14 11:59 PM.  Always use your most recent med list.               atorvastatin 80 MG tablet  Commonly known as:  LIPITOR  Take 1 tablet (80 mg total) by mouth daily. For cholesterol     Cyanocobalamin 1000 MCG Tbcr  Take 1 tablet (1,000 mcg total) by mouth every morning.     diclofenac 75 MG EC tablet  Commonly known as:  VOLTAREN  TAKE 1 TABLET BY MOUTH TWICE A DAY      Flax Seed Oil 1000 MG Caps  Take 1 capsule (1,000 mg total) by  mouth 2 (two) times daily.     fluticasone 50 MCG/ACT nasal spray  Commonly known as:  FLONASE  Place 1 spray into both nostrils daily.     gabapentin 300 MG capsule  Commonly known as:  NEURONTIN  Take 300 mg by mouth 3 (three) times daily.     loratadine 10 MG tablet  Commonly known as:  CLARITIN  Take 10 mg by mouth as needed.     Magnesium 250 MG Tabs  Take 250 mg by mouth daily.     omeprazole 20 MG capsule  Commonly known as:  PRILOSEC  Take 1 capsule (20 mg total) by mouth 2 (two) times daily before a meal.     tamsulosin 0.4 MG Caps capsule  Commonly known as:  FLOMAX  Take 0.4 mg by mouth daily before breakfast.     testosterone cypionate 200 MG/ML injection  Commonly known as:  DEPOTESTOTERONE CYPIONATE  INJECT 2 MLS IMTRAMUSCULARLY EVERY 2 WEEKS     Vitamin D 2000 UNITS tablet  Take 4,000 Units by mouth daily. Take 4,000 units daily.       Allergies  Allergen Reactions  . Salicylates Rash  . Aspirin     REACTION: rash   Past Medical History  Diagnosis Date  . Seasonal allergies   . GERD (gastroesophageal reflux disease)   . Hyperlipidemia   . BPH (benign prostatic hyperplasia)   . Hypogonadism male   . ED (erectile dysfunction)   . Pre-diabetes    Past Surgical History  Procedure Laterality Date  . Cervical fusion  April 2012 and 2010  . Knee arthroscopy      right 1985; left 1992  . Septoplasty    . Shoulder arthroscopy Left 2015   History  Substance Use Topics  . Smoking status: Former Smoker    Quit date: 05/24/1991  . Smokeless tobacco: Not on file  . Alcohol Use: Yes     Comment: occasional. drinks beer once a week.   Family History  Problem Relation Age of Onset  . Colon cancer Neg Hx   . Stomach cancer Neg Hx   . Leukemia Mother   . Diabetes Mother   . Hypertension Father   . Diabetes Father      MAINTENANCE: Colonoscopy:2009 LZJ:QBHALP Dentist:q 6  month  IMMUNIZATIONS: Tdap: 06/01/2014 Pneumovax:03/2013 Influenza: ?  Patient Care Team: Unk Pinto, MD as PCP - General (Internal Medicine) Inda Castle, MD as Consulting Physician (Gastroenterology) Claybon Jabs, MD as Consulting Physician (Urology) Floyce Stakes, MD as Consulting Physician (Neurosurgery) Barbaraann Cao, OD as Referring Physician (Optometry) Simona Huh, MD as Consulting Physician (Dermatology) Nance, (hearing) Ramos, (Back) Supple, (Shoulder)  Review of Systems  Constitutional: Negative for fatigue.  Respiratory: Negative for shortness of breath.   Cardiovascular: Negative for chest pain.  Musculoskeletal: Positive for back pain.  All other systems reviewed and are negative.  BP 146/98  Pulse 60  Temp(Src) 98.6 F (37 C) (Temporal)  Resp 16  Ht 6' 1.5" (1.867 m)  Wt 206 lb (93.441 kg)  BMI 26.81 kg/m2     Objective:   Physical Exam  Nursing note and vitals reviewed. Constitutional: He is oriented to person, place, and time. He appears well-developed and well-nourished.  HENT:  Head: Normocephalic and atraumatic.  Right Ear: External ear normal.  Left Ear: External ear normal.  Nose: Nose normal.  Mouth/Throat: Oropharynx is clear and moist.  Decreased hearing bilaterally without aides  Eyes: Conjunctivae and  EOM are normal. Pupils are equal, round, and reactive to light. Right eye exhibits no discharge. Left eye exhibits no discharge. No scleral icterus.  Neck: Normal range of motion. Neck supple. No JVD present. No tracheal deviation present. No thyromegaly present.  Cardiovascular: Normal rate, regular rhythm, normal heart sounds and intact distal pulses.   Pulmonary/Chest: Effort normal and breath sounds normal.  Abdominal: Soft. Bowel sounds are normal. He exhibits no distension and no mass. There is no tenderness. There is no rebound and no guarding.  Genitourinary: Prostate normal. Guaiac negative stool.   Musculoskeletal: Normal range of motion. He exhibits tenderness. He exhibits no edema.  Low back with change of position mild tenderness  Lymphadenopathy:    He has no cervical adenopathy.  Neurological: He is alert and oriented to person, place, and time. He has normal reflexes. No cranial nerve deficit. He exhibits normal muscle tone. Coordination normal.  Skin: Skin is warm and dry. No rash noted. No erythema. No pallor.  Psychiatric: He has a normal mood and affect. His behavior is normal. Judgment and thought content normal.     AORTA SCAN WNL EKG NSCSPT      Assessment & Plan:  1. CPE- Update screening labs/ History/ Immunizations/ Testing as needed. Advised healthy diet, QD exercise, increase H20 and continue RX/ Vitamins AD.  2. 3 month F/U for elevated BP w/o HTN, Cholesterol, Pre-Dm, D. Deficient. Needs healthy diet, cardio QD and obtain healthy weight. Check Labs, Check BP if >130/80 call office  3. Back/ hip pain-keep f/u Ortho, Recommend water exercise for back/ hip

## 2014-05-28 LAB — LIPID PANEL
CHOL/HDL RATIO: 3.3 ratio
CHOLESTEROL: 156 mg/dL (ref 0–200)
HDL: 47 mg/dL (ref >39)
LDL Cholesterol: 83 mg/dL (ref 0–99)
TRIGLYCERIDES: 131 mg/dL (ref <150)
VLDL: 26 mg/dL (ref 0–40)

## 2014-05-28 LAB — HEPATIC FUNCTION PANEL
ALT: 30 U/L (ref 0–53)
AST: 22 U/L (ref 0–37)
Albumin: 4.2 g/dL (ref 3.5–5.2)
Alkaline Phosphatase: 93 U/L (ref 39–117)
BILIRUBIN TOTAL: 0.4 mg/dL (ref 0.2–1.2)
Bilirubin, Direct: 0.1 mg/dL (ref 0.0–0.3)
Indirect Bilirubin: 0.3 mg/dL (ref 0.2–1.2)
Total Protein: 7.8 g/dL (ref 6.0–8.3)

## 2014-05-28 LAB — BASIC METABOLIC PANEL WITH GFR
BUN: 12 mg/dL (ref 6–23)
CO2: 32 mEq/L (ref 19–32)
Calcium: 10 mg/dL (ref 8.4–10.5)
Chloride: 102 mEq/L (ref 96–112)
Creat: 1.02 mg/dL (ref 0.50–1.35)
GFR, EST NON AFRICAN AMERICAN: 78 mL/min
GLUCOSE: 80 mg/dL (ref 70–99)
POTASSIUM: 4.5 meq/L (ref 3.5–5.3)
SODIUM: 139 meq/L (ref 135–145)

## 2014-05-28 LAB — URINALYSIS, ROUTINE W REFLEX MICROSCOPIC
Bilirubin Urine: NEGATIVE
Glucose, UA: NEGATIVE mg/dL
Hgb urine dipstick: NEGATIVE
Ketones, ur: NEGATIVE mg/dL
Leukocytes, UA: NEGATIVE
NITRITE: NEGATIVE
Protein, ur: NEGATIVE mg/dL
Specific Gravity, Urine: 1.018 (ref 1.005–1.030)
UROBILINOGEN UA: 0.2 mg/dL (ref 0.0–1.0)
pH: 7 (ref 5.0–8.0)

## 2014-05-28 LAB — TESTOSTERONE: Testosterone: 1820.69 ng/dL — ABNORMAL HIGH (ref 300–890)

## 2014-05-28 LAB — MICROALBUMIN / CREATININE URINE RATIO
Creatinine, Urine: 163.1 mg/dL
MICROALB UR: 0.5 mg/dL (ref 0.00–1.89)
Microalb Creat Ratio: 3.1 mg/g (ref 0.0–30.0)

## 2014-05-28 LAB — MAGNESIUM: MAGNESIUM: 2 mg/dL (ref 1.5–2.5)

## 2014-05-28 LAB — VITAMIN D 25 HYDROXY (VIT D DEFICIENCY, FRACTURES): Vit D, 25-Hydroxy: 63 ng/mL (ref 30–89)

## 2014-05-28 LAB — INSULIN, FASTING: Insulin fasting, serum: 8 u[IU]/mL (ref 2.0–19.6)

## 2014-05-28 LAB — PSA: PSA: 2.21 ng/mL (ref <=4.00)

## 2014-05-28 LAB — TSH: TSH: 4.02 u[IU]/mL (ref 0.350–4.500)

## 2014-05-30 LAB — TB SKIN TEST
Induration: 0 mm
TB Skin Test: NEGATIVE

## 2014-06-01 ENCOUNTER — Encounter: Payer: Self-pay | Admitting: Emergency Medicine

## 2014-06-03 ENCOUNTER — Other Ambulatory Visit: Payer: Self-pay | Admitting: Physician Assistant

## 2014-06-03 DIAGNOSIS — M5136 Other intervertebral disc degeneration, lumbar region: Secondary | ICD-10-CM

## 2014-06-04 ENCOUNTER — Ambulatory Visit
Admission: RE | Admit: 2014-06-04 | Discharge: 2014-06-04 | Disposition: A | Discharge status: Home / Self Care | Payer: 59 | Source: Ambulatory Visit | Attending: Physician Assistant | Admitting: Physician Assistant

## 2014-06-04 DIAGNOSIS — M5136 Other intervertebral disc degeneration, lumbar region: Secondary | ICD-10-CM

## 2014-06-04 MED ORDER — IOHEXOL 180 MG/ML  SOLN
1.0000 mL | Freq: Once | INTRAMUSCULAR | Status: AC | PRN
  Administered 2014-06-04: 1 mL via EPIDURAL

## 2014-06-04 MED ORDER — METHYLPREDNISOLONE ACETATE 40 MG/ML INJ SUSP (RADIOLOG
120.0000 mg | Freq: Once | INTRAMUSCULAR | Status: AC
  Administered 2014-06-04: 120 mg via EPIDURAL

## 2014-06-04 NOTE — Discharge Instructions (Addendum)

## 2014-06-09 ENCOUNTER — Other Ambulatory Visit: Payer: 59

## 2014-06-10 ENCOUNTER — Other Ambulatory Visit: Payer: Self-pay | Admitting: Emergency Medicine

## 2014-06-16 ENCOUNTER — Other Ambulatory Visit (INDEPENDENT_AMBULATORY_CARE_PROVIDER_SITE_OTHER): Payer: 59 | Admitting: *Deleted

## 2014-06-16 DIAGNOSIS — Z1212 Encounter for screening for malignant neoplasm of rectum: Secondary | ICD-10-CM

## 2014-06-16 DIAGNOSIS — Z0000 Encounter for general adult medical examination without abnormal findings: Secondary | ICD-10-CM

## 2014-06-16 LAB — POC HEMOCCULT BLD/STL (HOME/3-CARD/SCREEN)
Card #3 Fecal Occult Blood, POC: NEGATIVE
FECAL OCCULT BLD: NEGATIVE
Fecal Occult Blood, POC: NEGATIVE

## 2014-07-17 ENCOUNTER — Other Ambulatory Visit: Payer: Self-pay | Admitting: Physician Assistant

## 2014-08-04 ENCOUNTER — Other Ambulatory Visit: Payer: Self-pay | Admitting: *Deleted

## 2014-08-04 MED ORDER — TAMSULOSIN HCL 0.4 MG PO CAPS: 0.4000 mg | ORAL_CAPSULE | Freq: Every day | ORAL | Status: DC

## 2014-08-25 ENCOUNTER — Ambulatory Visit: Payer: Self-pay | Admitting: Physician Assistant

## 2014-09-08 ENCOUNTER — Other Ambulatory Visit: Payer: Self-pay | Admitting: Neurosurgery

## 2014-09-18 ENCOUNTER — Encounter (HOSPITAL_COMMUNITY)
Admission: RE | Admit: 2014-09-18 | Discharge: 2014-09-18 | Disposition: A | Discharge status: Home / Self Care | Payer: 59 | Source: Ambulatory Visit | Attending: Neurosurgery | Admitting: Neurosurgery

## 2014-09-18 ENCOUNTER — Encounter (HOSPITAL_COMMUNITY): Payer: Self-pay

## 2014-09-18 DIAGNOSIS — Z01812 Encounter for preprocedural laboratory examination: Secondary | ICD-10-CM | POA: Diagnosis not present

## 2014-09-18 HISTORY — DX: Unspecified osteoarthritis, unspecified site: M19.90

## 2014-09-18 HISTORY — DX: Nocturia: R35.1

## 2014-09-18 HISTORY — DX: Other chronic pain: G89.29

## 2014-09-18 HISTORY — DX: Anesthesia of skin: R20.0

## 2014-09-18 HISTORY — DX: Dorsalgia, unspecified: M54.9

## 2014-09-18 HISTORY — DX: Frequency of micturition: R35.0

## 2014-09-18 HISTORY — DX: Diverticulosis of intestine, part unspecified, without perforation or abscess without bleeding: K57.90

## 2014-09-18 HISTORY — DX: Foot drop, unspecified foot: M21.379

## 2014-09-18 LAB — BASIC METABOLIC PANEL
Anion gap: 5 (ref 5–15)
BUN: 12 mg/dL (ref 6–23)
CALCIUM: 9 mg/dL (ref 8.4–10.5)
CHLORIDE: 106 meq/L (ref 96–112)
CO2: 26 mmol/L (ref 19–32)
CREATININE: 0.98 mg/dL (ref 0.50–1.35)
GFR calc Af Amer: >90 mL/min (ref >90)
GFR calc non Af Amer: 85 mL/min — ABNORMAL LOW (ref >90)
GLUCOSE: 88 mg/dL (ref 70–99)
Potassium: 4 mmol/L (ref 3.5–5.1)
Sodium: 137 mmol/L (ref 135–145)

## 2014-09-18 LAB — CBC
HCT: 46 % (ref 39.0–52.0)
Hemoglobin: 15.9 g/dL (ref 13.0–17.0)
MCH: 33.7 pg (ref 26.0–34.0)
MCHC: 34.6 g/dL (ref 30.0–36.0)
MCV: 97.5 fL (ref 78.0–100.0)
PLATELETS: 232 10*3/uL (ref 150–400)
RBC: 4.72 MIL/uL (ref 4.22–5.81)
RDW: 12.9 % (ref 11.5–15.5)
WBC: 3.5 10*3/uL — ABNORMAL LOW (ref 4.0–10.5)

## 2014-09-18 LAB — TYPE AND SCREEN
ABO/RH(D): O POS
Antibody Screen: NEGATIVE

## 2014-09-18 LAB — SURGICAL PCR SCREEN
MRSA, PCR: NEGATIVE
Staphylococcus aureus: POSITIVE — AB

## 2014-09-18 LAB — ABO/RH: ABO/RH(D): O POS

## 2014-09-18 NOTE — Progress Notes (Signed)
Mupirocin ointment Rx called into [facility] on [address] for positive PCR of staph. Pt notified and voiced understanding.

## 2014-09-18 NOTE — Progress Notes (Addendum)
Medical MD is Dr.William McKeown  EKG in epic from 05-27-14  Denies ever having a stress test/heart cath  Pt doesn't have a cardiologist  Denies CXR in past yr  Echo report in epic from 2010

## 2014-09-18 NOTE — Pre-Procedure Instructions (Signed)
Jacob Larsen  09/18/2014   Your procedure is scheduled on:  Tues, Jan 12 @ 2:10 PM  Report to [facility]  at 11:00 AM.  Call this number if you have problems the morning of surgery: [phone]   Remember:   Do not eat food or drink liquids after midnight.   Take these medicines the morning of surgery with A SIP OF WATER: Flonase(Fluticasone),Gabapentin(Neurontin),Pain Pill(if needed),Claritin(Loratadine-if needed),Omeprazole(Prilosec),and Flomax(Tamsulosin)              Stop taking your Diclofenac. No Goody's,BC's,Aleve,Aspirin,Ibuprofen,Fish Oil,or any Herbal Medications   Do not wear jewelry  Do not wear lotions, powders, or colognes. You may wear deodorant.  Men may shave face and neck.  Do not bring valuables to the hospital.  [facility] is not responsible                  for any belongings or valuables.               Contacts, dentures or bridgework may not be worn into surgery.  Leave suitcase in the car. After surgery it may be brought to your room.  For patients admitted to the hospital, discharge time is determined by your                treatment team.               Patients discharged the day of surgery will not be allowed to drive  home.    Special Instructions:  [facility] - Preparing for Surgery  Before surgery, you can play an important role.  Because skin is not sterile, your skin needs to be as free of germs as possible.  You can reduce the number of germs on you skin by washing with CHG (chlorahexidine gluconate) soap before surgery.  CHG is an antiseptic cleaner which kills germs and bonds with the skin to continue killing germs even after washing.  Please DO NOT use if you have an allergy to CHG or antibacterial soaps.  If your skin becomes reddened/irritated stop using the CHG and inform your nurse when you arrive at Short Stay.  Do not shave (including legs and underarms) for at least 48 hours prior to the first CHG shower.  You may shave your  face.  Please follow these instructions carefully:   1.  Shower with CHG Soap the night before surgery and the                                morning of Surgery.  2.  If you choose to wash your hair, wash your hair first as usual with your       normal shampoo.  3.  After you shampoo, rinse your hair and body thoroughly to remove the                      Shampoo.  4.  Use CHG as you would any other liquid soap.  You can apply chg directly       to the skin and wash gently with scrungie or a clean washcloth.  5.  Apply the CHG Soap to your body ONLY FROM THE NECK DOWN.        Do not use on open wounds or open sores.  Avoid contact with your eyes,       ears, mouth and genitals (private parts).  Wash genitals (private parts)       with your normal soap.  6.  Wash thoroughly, paying special attention to the area where your surgery        will be performed.  7.  Thoroughly rinse your body with warm water from the neck down.  8.  DO NOT shower/wash with your normal soap after using and rinsing off       the CHG Soap.  9.  Pat yourself dry with a clean towel.            10.  Wear clean pajamas.            11.  Place clean sheets on your bed the night of your first shower and do not        sleep with pets.  Day of Surgery  Do not apply any lotions/deoderants the morning of surgery.  Please wear clean clothes to the hospital/surgery center.    Please read over the following fact sheets that you were given: Pain Booklet, Coughing and Deep Breathing, Blood Transfusion Information, MRSA Information and Surgical Site Infection Prevention

## 2014-09-22 MED ORDER — CEFAZOLIN SODIUM-DEXTROSE 2-3 GM-% IV SOLR
2.0000 g | INTRAVENOUS | Status: AC
  Administered 2014-09-23: 2 g via INTRAVENOUS
  Filled 2014-09-22: qty 50

## 2014-09-22 NOTE — H&P (Signed)
Jacob Larsen is an 64 y.o. male.   Chief Complaint: pain both legs. HPI: patient seen by me with lumbar pain with radiation to both legs right worse  Than left. The only way for him to get around is to walk sit with flexion of the spine to minimize the pain. Epidural has not helped him.  Past Medical History  Diagnosis Date  . Seasonal allergies   . Hyperlipidemia   . BPH (benign prostatic hyperplasia)   . Hypogonadism male   . ED (erectile dysfunction)   . Foot drop     both feet  . Numbness     only in 4th and 5th finger on left hand  . Arthritis     in neck  . Chronic back pain     DDD  . GERD (gastroesophageal reflux disease)     takes Omeprazole daily  . Diverticulosis   . Urinary frequency     takes Flomax daily  . Enlarged prostate   . Nocturia     Past Surgical History  Procedure Laterality Date  . Cervical fusion  2011/2014  . Knee arthroscopy      right 1985; left 1992  . Septoplasty    . Shoulder arthroscopy Left 2015  . Shoulder surgery Right 08  . Colonoscopy      Family History  Problem Relation Age of Onset  . Colon cancer Neg Hx   . Stomach cancer Neg Hx   . Leukemia Mother   . Diabetes Mother   . Hypertension Father   . Diabetes Father    Social History:  reports that he has quit smoking. He does not have any smokeless tobacco history on file. He reports that he drinks alcohol. He reports that he does not use illicit drugs.  Allergies:  Allergies  Allergen Reactions  . Salicylates Rash  . Aspirin     REACTION: rash  . Other     Beef-hives    No prescriptions prior to admission    No results found for this or any previous visit (from the past 48 hour(s)). No results found.  Review of Systems  Constitutional: Negative.   Eyes: Negative.   Respiratory: Negative.   Cardiovascular: Negative.   Gastrointestinal: Negative.   Genitourinary: Negative.   Musculoskeletal: Positive for back pain and neck pain.  Skin: Negative.    Neurological: Positive for sensory change and focal weakness.  Endo/Heme/Allergies: Negative.   Psychiatric/Behavioral: Negative.     There were no vitals taken for this visit. Physical Exam hent, nl. Neck, anterior scar. Some pain with mobility. Cv, nl. Lungs, clear. Abomen, nl. Extremities., nl. NEURO weakness of DF of left foot. slr positive bilaterally. Sensory normal but he complains of tingling both legs. Mri and lumbar spine shows severe ddd at l4-5 with facet arthropahy.  Assessment/Plan Patient wants to go ahead with surgery which will be a decompression and fusion at l45 with cages and screws. He is aware of risks and benefits  Josephanthony Tindel M 09/22/2014, 5:40 PM

## 2014-09-23 ENCOUNTER — Inpatient Hospital Stay (HOSPITAL_COMMUNITY): Payer: 59

## 2014-09-23 ENCOUNTER — Inpatient Hospital Stay (HOSPITAL_COMMUNITY)
Admission: RE | Admit: 2014-09-23 | Discharge: 2014-09-27 | DRG: 460 | Disposition: A | Discharge status: Home Health | Payer: 59 | Source: Ambulatory Visit | Attending: Neurosurgery | Admitting: Neurosurgery

## 2014-09-23 ENCOUNTER — Encounter (HOSPITAL_COMMUNITY): Payer: Self-pay | Admitting: *Deleted

## 2014-09-23 ENCOUNTER — Inpatient Hospital Stay (HOSPITAL_COMMUNITY): Payer: 59 | Admitting: Anesthesiology

## 2014-09-23 ENCOUNTER — Encounter (HOSPITAL_COMMUNITY)
Admission: RE | Disposition: A | Discharge status: Home Health | Payer: 59 | Source: Ambulatory Visit | Attending: Neurosurgery

## 2014-09-23 DIAGNOSIS — Z87891 Personal history of nicotine dependence: Secondary | ICD-10-CM | POA: Diagnosis not present

## 2014-09-23 DIAGNOSIS — M4326 Fusion of spine, lumbar region: Secondary | ICD-10-CM

## 2014-09-23 DIAGNOSIS — Z981 Arthrodesis status: Secondary | ICD-10-CM | POA: Diagnosis not present

## 2014-09-23 DIAGNOSIS — Z91018 Allergy to other foods: Secondary | ICD-10-CM

## 2014-09-23 DIAGNOSIS — K219 Gastro-esophageal reflux disease without esophagitis: Secondary | ICD-10-CM | POA: Diagnosis present

## 2014-09-23 DIAGNOSIS — M5116 Intervertebral disc disorders with radiculopathy, lumbar region: Secondary | ICD-10-CM | POA: Diagnosis present

## 2014-09-23 DIAGNOSIS — M5136 Other intervertebral disc degeneration, lumbar region: Secondary | ICD-10-CM

## 2014-09-23 DIAGNOSIS — M79606 Pain in leg, unspecified: Secondary | ICD-10-CM | POA: Diagnosis present

## 2014-09-23 DIAGNOSIS — R509 Fever, unspecified: Secondary | ICD-10-CM

## 2014-09-23 DIAGNOSIS — Z886 Allergy status to analgesic agent status: Secondary | ICD-10-CM

## 2014-09-23 DIAGNOSIS — E785 Hyperlipidemia, unspecified: Secondary | ICD-10-CM | POA: Diagnosis present

## 2014-09-23 SURGERY — POSTERIOR LUMBAR FUSION 1 LEVEL
Anesthesia: General | Site: Back

## 2014-09-23 MED ORDER — FENTANYL CITRATE 0.05 MG/ML IJ SOLN
25.0000 ug | INTRAMUSCULAR | Status: DC | PRN
  Administered 2014-09-23 (×3): 50 ug via INTRAVENOUS

## 2014-09-23 MED ORDER — ONDANSETRON HCL 4 MG/2ML IJ SOLN: 4.0000 mg | INTRAMUSCULAR | Status: DC | PRN

## 2014-09-23 MED ORDER — CEFAZOLIN SODIUM 1-5 GM-% IV SOLN
1.0000 g | Freq: Three times a day (TID) | INTRAVENOUS | Status: AC
  Administered 2014-09-23 – 2014-09-24 (×2): 1 g via INTRAVENOUS
  Filled 2014-09-23 (×2): qty 50

## 2014-09-23 MED ORDER — LORATADINE 10 MG PO TABS: 10.0000 mg | ORAL_TABLET | ORAL | Status: DC | PRN

## 2014-09-23 MED ORDER — ZOLPIDEM TARTRATE 5 MG PO TABS: 10.0000 mg | ORAL_TABLET | Freq: Every evening | ORAL | Status: DC | PRN

## 2014-09-23 MED ORDER — TAMSULOSIN HCL 0.4 MG PO CAPS
0.4000 mg | ORAL_CAPSULE | Freq: Every day | ORAL | Status: DC
  Administered 2014-09-24 – 2014-09-27 (×4): 0.4 mg via ORAL
  Filled 2014-09-23 (×4): qty 1

## 2014-09-23 MED ORDER — FENTANYL CITRATE 0.05 MG/ML IJ SOLN
INTRAMUSCULAR | Status: AC
  Filled 2014-09-23: qty 2

## 2014-09-23 MED ORDER — EPHEDRINE SULFATE 50 MG/ML IJ SOLN
INTRAMUSCULAR | Status: DC | PRN
  Administered 2014-09-23 (×2): 10 mg via INTRAVENOUS

## 2014-09-23 MED ORDER — VANCOMYCIN HCL 1000 MG IV SOLR
INTRAVENOUS | Status: DC | PRN
  Administered 2014-09-23 (×2): 1000 mg

## 2014-09-23 MED ORDER — ARTIFICIAL TEARS OP OINT
TOPICAL_OINTMENT | OPHTHALMIC | Status: DC | PRN
  Administered 2014-09-23: 1 via OPHTHALMIC

## 2014-09-23 MED ORDER — PANTOPRAZOLE SODIUM 40 MG PO TBEC
40.0000 mg | DELAYED_RELEASE_TABLET | Freq: Every day | ORAL | Status: DC
  Administered 2014-09-24: 40 mg via ORAL
  Filled 2014-09-23 (×2): qty 1

## 2014-09-23 MED ORDER — GLYCOPYRROLATE 0.2 MG/ML IJ SOLN
INTRAMUSCULAR | Status: DC | PRN
  Administered 2014-09-23: 0.4 mg via INTRAVENOUS

## 2014-09-23 MED ORDER — PROMETHAZINE HCL 25 MG/ML IJ SOLN: 6.2500 mg | INTRAMUSCULAR | Status: DC | PRN

## 2014-09-23 MED ORDER — DIAZEPAM 5 MG PO TABS
10.0000 mg | ORAL_TABLET | Freq: Four times a day (QID) | ORAL | Status: DC | PRN
  Administered 2014-09-24 – 2014-09-27 (×9): 10 mg via ORAL
  Filled 2014-09-23 (×10): qty 2

## 2014-09-23 MED ORDER — SODIUM CHLORIDE 0.9 % IJ SOLN: 3.0000 mL | INTRAMUSCULAR | Status: DC | PRN

## 2014-09-23 MED ORDER — MENTHOL 3 MG MT LOZG: 1.0000 | LOZENGE | OROMUCOSAL | Status: DC | PRN

## 2014-09-23 MED ORDER — ACETAMINOPHEN 650 MG RE SUPP: 650.0000 mg | RECTAL | Status: DC | PRN

## 2014-09-23 MED ORDER — LIDOCAINE HCL (CARDIAC) 20 MG/ML IV SOLN
INTRAVENOUS | Status: DC | PRN
  Administered 2014-09-23: 100 mg via INTRAVENOUS

## 2014-09-23 MED ORDER — GABAPENTIN 300 MG PO CAPS
300.0000 mg | ORAL_CAPSULE | Freq: Three times a day (TID) | ORAL | Status: DC | PRN
  Administered 2014-09-24 – 2014-09-25 (×2): 300 mg via ORAL
  Filled 2014-09-23 (×2): qty 1

## 2014-09-23 MED ORDER — FENTANYL 10 MCG/ML IV SOLN
INTRAVENOUS | Status: DC
  Administered 2014-09-23: 10 ug via INTRAVENOUS
  Administered 2014-09-24: 135 ug via INTRAVENOUS
  Administered 2014-09-24: 15:00:00 via INTRAVENOUS
  Administered 2014-09-24: 75 ug via INTRAVENOUS
  Administered 2014-09-24: 36 ug via INTRAVENOUS
  Administered 2014-09-24: 120 ug via INTRAVENOUS
  Administered 2014-09-24: 135 ug via INTRAVENOUS
  Administered 2014-09-25 (×2): 30 ug via INTRAVENOUS
  Administered 2014-09-25: 105 ug via INTRAVENOUS
  Filled 2014-09-23 (×3): qty 50

## 2014-09-23 MED ORDER — FENTANYL CITRATE 0.05 MG/ML IJ SOLN
INTRAMUSCULAR | Status: AC
  Filled 2014-09-23: qty 5

## 2014-09-23 MED ORDER — FENTANYL CITRATE 0.05 MG/ML IJ SOLN
INTRAMUSCULAR | Status: AC
  Administered 2014-09-23: 50 ug via INTRAVENOUS
  Filled 2014-09-23: qty 2

## 2014-09-23 MED ORDER — ONDANSETRON HCL 4 MG/2ML IJ SOLN: 4.0000 mg | Freq: Four times a day (QID) | INTRAMUSCULAR | Status: DC | PRN

## 2014-09-23 MED ORDER — DEXAMETHASONE SODIUM PHOSPHATE 10 MG/ML IJ SOLN
INTRAMUSCULAR | Status: DC | PRN
  Administered 2014-09-23: 10 mg via INTRAVENOUS

## 2014-09-23 MED ORDER — SODIUM CHLORIDE 0.9 % IJ SOLN: 9.0000 mL | INTRAMUSCULAR | Status: DC | PRN

## 2014-09-23 MED ORDER — OXYCODONE-ACETAMINOPHEN 5-325 MG PO TABS
2.0000 | ORAL_TABLET | ORAL | Status: DC | PRN
Start: 2014-09-23 — End: 2014-09-23

## 2014-09-23 MED ORDER — SODIUM CHLORIDE 0.9 % IJ SOLN: 3.0000 mL | Freq: Two times a day (BID) | INTRAMUSCULAR | Status: DC

## 2014-09-23 MED ORDER — SODIUM CHLORIDE 0.9 % IV SOLN
INTRAVENOUS | Status: DC | PRN
  Administered 2014-09-23: 17:00:00 via INTRAVENOUS

## 2014-09-23 MED ORDER — ONDANSETRON HCL 4 MG/2ML IJ SOLN
INTRAMUSCULAR | Status: DC | PRN
  Administered 2014-09-23: 4 mg via INTRAVENOUS

## 2014-09-23 MED ORDER — SODIUM CHLORIDE 0.9 % IJ SOLN
3.0000 mL | Freq: Two times a day (BID) | INTRAMUSCULAR | Status: DC
  Administered 2014-09-25 – 2014-09-27 (×5): 3 mL via INTRAVENOUS

## 2014-09-23 MED ORDER — FENTANYL CITRATE 0.05 MG/ML IJ SOLN
INTRAMUSCULAR | Status: DC | PRN
  Administered 2014-09-23 (×3): 50 ug via INTRAVENOUS
  Administered 2014-09-23: 100 ug via INTRAVENOUS

## 2014-09-23 MED ORDER — PHENOL 1.4 % MT LIQD: 1.0000 | OROMUCOSAL | Status: DC | PRN

## 2014-09-23 MED ORDER — ATORVASTATIN CALCIUM 80 MG PO TABS
80.0000 mg | ORAL_TABLET | Freq: Every day | ORAL | Status: DC
  Administered 2014-09-24 – 2014-09-27 (×4): 80 mg via ORAL
  Filled 2014-09-23 (×4): qty 1

## 2014-09-23 MED ORDER — SODIUM CHLORIDE 0.9 % IV SOLN: 250.0000 mL | INTRAVENOUS | Status: DC

## 2014-09-23 MED ORDER — MEPERIDINE HCL 25 MG/ML IJ SOLN: 6.2500 mg | INTRAMUSCULAR | Status: DC | PRN

## 2014-09-23 MED ORDER — MIDAZOLAM HCL 2 MG/2ML IJ SOLN
INTRAMUSCULAR | Status: AC
  Filled 2014-09-23: qty 2

## 2014-09-23 MED ORDER — ROCURONIUM BROMIDE 100 MG/10ML IV SOLN
INTRAVENOUS | Status: DC | PRN
  Administered 2014-09-23: 50 mg via INTRAVENOUS

## 2014-09-23 MED ORDER — FLUTICASONE PROPIONATE 50 MCG/ACT NA SUSP
1.0000 | Freq: Every day | NASAL | Status: DC
  Administered 2014-09-25: 1 via NASAL
  Filled 2014-09-23: qty 16

## 2014-09-23 MED ORDER — MENTHOL 3 MG MT LOZG
1.0000 | LOZENGE | OROMUCOSAL | Status: DC | PRN
Start: 2014-09-23 — End: 2014-09-23

## 2014-09-23 MED ORDER — BUPIVACAINE LIPOSOME 1.3 % IJ SUSP
20.0000 mL | INTRAMUSCULAR | Status: AC
  Administered 2014-09-23: 20 mL
  Filled 2014-09-23: qty 20

## 2014-09-23 MED ORDER — PROPOFOL 10 MG/ML IV BOLUS
INTRAVENOUS | Status: DC | PRN
  Administered 2014-09-23: 150 mg via INTRAVENOUS

## 2014-09-23 MED ORDER — THROMBIN 20000 UNITS EX SOLR
CUTANEOUS | Status: DC | PRN
  Administered 2014-09-23: 20 mL via TOPICAL

## 2014-09-23 MED ORDER — NALOXONE HCL 0.4 MG/ML IJ SOLN: 0.4000 mg | INTRAMUSCULAR | Status: DC | PRN

## 2014-09-23 MED ORDER — SODIUM CHLORIDE 0.9 % IV SOLN: INTRAVENOUS | Status: DC

## 2014-09-23 MED ORDER — NEOSTIGMINE METHYLSULFATE 10 MG/10ML IV SOLN
INTRAVENOUS | Status: DC | PRN
  Administered 2014-09-23: 3 mg via INTRAVENOUS

## 2014-09-23 MED ORDER — ACETAMINOPHEN 325 MG PO TABS: 650.0000 mg | ORAL_TABLET | ORAL | Status: DC | PRN

## 2014-09-23 MED ORDER — CEFAZOLIN SODIUM 1-5 GM-% IV SOLN: 1.0000 g | Freq: Three times a day (TID) | INTRAVENOUS | Status: DC

## 2014-09-23 MED ORDER — DIAZEPAM 5 MG PO TABS: 10.0000 mg | ORAL_TABLET | Freq: Four times a day (QID) | ORAL | Status: DC | PRN

## 2014-09-23 MED ORDER — OXYCODONE-ACETAMINOPHEN 5-325 MG PO TABS
2.0000 | ORAL_TABLET | ORAL | Status: DC | PRN
  Administered 2014-09-23 – 2014-09-25 (×3): 2 via ORAL
  Filled 2014-09-23 (×4): qty 2

## 2014-09-23 MED ORDER — VANCOMYCIN HCL 1000 MG IV SOLR
INTRAVENOUS | Status: AC
Start: 2014-09-23 — End: 2014-09-24
  Filled 2014-09-23: qty 2000

## 2014-09-23 MED ORDER — MIDAZOLAM HCL 5 MG/5ML IJ SOLN
INTRAMUSCULAR | Status: DC | PRN
  Administered 2014-09-23: 2 mg via INTRAVENOUS

## 2014-09-23 MED ORDER — SODIUM CHLORIDE 0.9 % IV SOLN
INTRAVENOUS | Status: DC
  Administered 2014-09-23 – 2014-09-24 (×2): 1000 mL via INTRAVENOUS

## 2014-09-23 MED ORDER — LACTATED RINGERS IV SOLN
INTRAVENOUS | Status: DC
  Administered 2014-09-23 (×4): via INTRAVENOUS

## 2014-09-23 MED ORDER — ONDANSETRON HCL 4 MG/2ML IJ SOLN
4.0000 mg | INTRAMUSCULAR | Status: DC | PRN
  Administered 2014-09-24 – 2014-09-25 (×2): 4 mg via INTRAVENOUS
  Filled 2014-09-23 (×2): qty 2

## 2014-09-23 MED ORDER — PHENYLEPHRINE HCL 10 MG/ML IJ SOLN
INTRAMUSCULAR | Status: DC | PRN
  Administered 2014-09-23 (×4): 80 ug via INTRAVENOUS

## 2014-09-23 MED ORDER — ACETAMINOPHEN 325 MG PO TABS
650.0000 mg | ORAL_TABLET | ORAL | Status: DC | PRN
  Administered 2014-09-25 – 2014-09-26 (×3): 650 mg via ORAL
  Filled 2014-09-23 (×4): qty 2

## 2014-09-23 MED ORDER — 0.9 % SODIUM CHLORIDE (POUR BTL) OPTIME
TOPICAL | Status: DC | PRN
  Administered 2014-09-23: 1000 mL

## 2014-09-23 MED ORDER — DIPHENHYDRAMINE HCL 50 MG/ML IJ SOLN: 12.5000 mg | Freq: Four times a day (QID) | INTRAMUSCULAR | Status: DC | PRN

## 2014-09-23 MED ORDER — VITAMIN D 1000 UNITS PO TABS
4000.0000 [IU] | ORAL_TABLET | Freq: Every day | ORAL | Status: DC
  Administered 2014-09-24 – 2014-09-27 (×4): 4000 [IU] via ORAL
  Filled 2014-09-23 (×4): qty 4

## 2014-09-23 MED ORDER — PROPOFOL 10 MG/ML IV BOLUS
INTRAVENOUS | Status: AC
  Filled 2014-09-23: qty 20

## 2014-09-23 MED ORDER — DIPHENHYDRAMINE HCL 12.5 MG/5ML PO ELIX: 12.5000 mg | ORAL_SOLUTION | Freq: Four times a day (QID) | ORAL | Status: DC | PRN

## 2014-09-23 SURGICAL SUPPLY — 68 items
APL SKNCLS STERI-STRIP NONHPOA (GAUZE/BANDAGES/DRESSINGS) ×1
BENZOIN TINCTURE PRP APPL 2/3 (GAUZE/BANDAGES/DRESSINGS) ×2 IMPLANT
BLADE CLIPPER SURG (BLADE) IMPLANT
BUR ACORN 6.0 (BURR) ×2 IMPLANT
BUR MATCHSTICK NEURO 3.0 LAGG (BURR) ×2 IMPLANT
CANISTER SUCT 3000ML (MISCELLANEOUS) ×2 IMPLANT
CAP LOCKING THREADED (Cap) ×2 IMPLANT
CONT SPEC 4OZ CLIKSEAL STRL BL (MISCELLANEOUS) ×2 IMPLANT
COVER BACK TABLE 60X90IN (DRAPES) ×2 IMPLANT
CROSSLINK SPINAL 38-50MM (Neuro Prosthesis/Implant) ×1 IMPLANT
DRAPE C-ARM 42X72 X-RAY (DRAPES) ×4 IMPLANT
DRAPE LAPAROTOMY 100X72X124 (DRAPES) ×2 IMPLANT
DRAPE POUCH INSTRU U-SHP 10X18 (DRAPES) ×2 IMPLANT
DRSG OPSITE POSTOP 4X6 (GAUZE/BANDAGES/DRESSINGS) ×1 IMPLANT
DRSG PAD ABDOMINAL 8X10 ST (GAUZE/BANDAGES/DRESSINGS) IMPLANT
DURAPREP 26ML APPLICATOR (WOUND CARE) ×2 IMPLANT
ELECT REM PT RETURN 9FT ADLT (ELECTROSURGICAL) ×2
ELECTRODE REM PT RTRN 9FT ADLT (ELECTROSURGICAL) ×1 IMPLANT
EVACUATOR 1/8 PVC DRAIN (DRAIN) ×1 IMPLANT
GAUZE SPONGE 4X4 12PLY STRL (GAUZE/BANDAGES/DRESSINGS) ×2 IMPLANT
GAUZE SPONGE 4X4 16PLY XRAY LF (GAUZE/BANDAGES/DRESSINGS) ×2 IMPLANT
GLOVE BIOGEL M 8.0 STRL (GLOVE) ×2 IMPLANT
GLOVE BIOGEL PI IND STRL 7.5 (GLOVE) IMPLANT
GLOVE BIOGEL PI INDICATOR 7.5 (GLOVE) ×1
GLOVE ECLIPSE 7.5 STRL STRAW (GLOVE) ×1 IMPLANT
GLOVE EXAM NITRILE LRG STRL (GLOVE) IMPLANT
GLOVE EXAM NITRILE MD LF STRL (GLOVE) IMPLANT
GLOVE EXAM NITRILE XL STR (GLOVE) IMPLANT
GLOVE EXAM NITRILE XS STR PU (GLOVE) IMPLANT
GOWN STRL REUS W/ TWL LRG LVL3 (GOWN DISPOSABLE) ×1 IMPLANT
GOWN STRL REUS W/ TWL XL LVL3 (GOWN DISPOSABLE) IMPLANT
GOWN STRL REUS W/TWL 2XL LVL3 (GOWN DISPOSABLE) IMPLANT
GOWN STRL REUS W/TWL LRG LVL3 (GOWN DISPOSABLE) ×6
GOWN STRL REUS W/TWL XL LVL3 (GOWN DISPOSABLE) ×4
IMPLANT RISE 8X22 9-15MM (Neuro Prosthesis/Implant) ×2 IMPLANT
KIT BASIN OR (CUSTOM PROCEDURE TRAY) ×2 IMPLANT
KIT INFUSE MEDIUM (Orthopedic Implant) ×1 IMPLANT
KIT ROOM TURNOVER OR (KITS) ×2 IMPLANT
NDL HYPO 18GX1.5 BLUNT FILL (NEEDLE) IMPLANT
NDL HYPO 21X1.5 SAFETY (NEEDLE) IMPLANT
NDL HYPO 25X1 1.5 SAFETY (NEEDLE) IMPLANT
NEEDLE HYPO 18GX1.5 BLUNT FILL (NEEDLE) IMPLANT
NEEDLE HYPO 21X1.5 SAFETY (NEEDLE) ×2 IMPLANT
NEEDLE HYPO 25X1 1.5 SAFETY (NEEDLE) IMPLANT
NS IRRIG 1000ML POUR BTL (IV SOLUTION) ×2 IMPLANT
PACK LAMINECTOMY NEURO (CUSTOM PROCEDURE TRAY) ×2 IMPLANT
PAD ARMBOARD 7.5X6 YLW CONV (MISCELLANEOUS) ×6 IMPLANT
PATTIES SURGICAL .5 X1 (DISPOSABLE) ×2 IMPLANT
PATTIES SURGICAL .5 X3 (DISPOSABLE) IMPLANT
ROD CREO 50MM (Rod) ×2 IMPLANT
SCREW CREO THREADED 5.5X45MM (Screw) ×2 IMPLANT
SPECIMEN JAR SMALL (MISCELLANEOUS) ×1 IMPLANT
SPONGE LAP 4X18 X RAY DECT (DISPOSABLE) IMPLANT
SPONGE NEURO XRAY DETECT 1X3 (DISPOSABLE) IMPLANT
SPONGE SURGIFOAM ABS GEL 100 (HEMOSTASIS) ×2 IMPLANT
STRIP CLOSURE SKIN 1/2X4 (GAUZE/BANDAGES/DRESSINGS) ×2 IMPLANT
STRIP CLOSURE SKIN 1/4X4 (GAUZE/BANDAGES/DRESSINGS) ×1 IMPLANT
SUT VIC AB 1 CT1 18XBRD ANBCTR (SUTURE) ×2 IMPLANT
SUT VIC AB 1 CT1 8-18 (SUTURE) ×4
SUT VIC AB 2-0 CP2 18 (SUTURE) ×2 IMPLANT
SUT VIC AB 3-0 SH 8-18 (SUTURE) ×2 IMPLANT
SYR 20CC LL (SYRINGE) ×1 IMPLANT
SYR 20ML ECCENTRIC (SYRINGE) ×2 IMPLANT
SYR 5ML LL (SYRINGE) IMPLANT
TOWEL OR 17X24 6PK STRL BLUE (TOWEL DISPOSABLE) ×2 IMPLANT
TOWEL OR 17X26 10 PK STRL BLUE (TOWEL DISPOSABLE) ×2 IMPLANT
TRAY FOLEY CATH 14FRSI W/METER (CATHETERS) ×2 IMPLANT
WATER STERILE IRR 1000ML POUR (IV SOLUTION) ×2 IMPLANT

## 2014-09-23 NOTE — Transfer of Care (Signed)
Immediate Anesthesia Transfer of Care Note  Patient: Jacob Larsen  Procedure(s) Performed: Procedure(s) with comments: Lumbar four-five posterior lumbar interbody fusion (N/A) - L4-5 Posterior lumbar interbody fusion  Patient Location: PACU  Anesthesia Type:General  Level of Consciousness: awake, alert , oriented and patient cooperative  Airway & Oxygen Therapy: Patient Spontanous Breathing and Patient connected to nasal cannula oxygen  Post-op Assessment: Report given to PACU RN and Post -op Vital signs reviewed and stable  Post vital signs: Reviewed and stable  Complications: No apparent anesthesia complications

## 2014-09-23 NOTE — Progress Notes (Signed)
Patient arrived to 4N06. Temp 97.8, BP 123/73, 97% RA, RR 20. Call bell within reach. Patient educated about floor and room. Report given to night RN.

## 2014-09-23 NOTE — Anesthesia Preprocedure Evaluation (Addendum)
Anesthesia Evaluation  Patient identified by MRN, date of birth, ID band Patient awake    Reviewed: Allergy & Precautions, NPO status , Patient's Chart, lab work & pertinent test results, reviewed documented beta blocker date and time   Airway Mallampati: II       Dental  (+) Teeth Intact, Dental Advisory Given,    Pulmonary former smoker,  breath sounds clear to auscultation        Cardiovascular hypertension, Rhythm:Irregular  ECHO 2010 EF 60%, EKG 2015 WNL   Neuro/Psych    GI/Hepatic Neg liver ROS, GERD-  Medicated,  Endo/Other  negative endocrine ROS  Renal/GU negative Renal ROS     Musculoskeletal   Abdominal (+) + obese,   Peds  Hematology negative hematology ROS (+)   Anesthesia Other Findings   Reproductive/Obstetrics                            Anesthesia Physical Anesthesia Plan  ASA: II  Anesthesia Plan: General   Post-op Pain Management:    Induction: Intravenous  Airway Management Planned: Oral ETT  Additional Equipment:   Intra-op Plan:   Post-operative Plan: Extubation in OR  Informed Consent: I have reviewed the patients History and Physical, chart, labs and discussed the procedure including the risks, benefits and alternatives for the proposed anesthesia with the patient or authorized representative who has indicated his/her understanding and acceptance.     Plan Discussed with:   Anesthesia Plan Comments:         Anesthesia Quick Evaluation

## 2014-09-24 MED ORDER — ALUM & MAG HYDROXIDE-SIMETH 200-200-20 MG/5ML PO SUSP
ORAL | Status: AC
  Administered 2014-09-24: 30 mL via ORAL
  Filled 2014-09-24: qty 30

## 2014-09-24 MED ORDER — METOCLOPRAMIDE HCL 5 MG/ML IJ SOLN: 5.0000 mg | Freq: Two times a day (BID) | INTRAMUSCULAR | Status: DC

## 2014-09-24 MED ORDER — METOCLOPRAMIDE HCL 5 MG PO TABS
5.0000 mg | ORAL_TABLET | Freq: Three times a day (TID) | ORAL | Status: DC | PRN
  Administered 2014-09-24: 5 mg via ORAL
  Filled 2014-09-24: qty 1

## 2014-09-24 MED ORDER — SODIUM CHLORIDE 0.9 % IV SOLN
12.5000 mg | Freq: Three times a day (TID) | INTRAVENOUS | Status: DC | PRN
  Administered 2014-09-24: 12.5 mg via INTRAVENOUS
  Filled 2014-09-24 (×3): qty 0.5

## 2014-09-24 MED ORDER — PANTOPRAZOLE SODIUM 40 MG PO TBEC
40.0000 mg | DELAYED_RELEASE_TABLET | Freq: Two times a day (BID) | ORAL | Status: DC
  Administered 2014-09-24 – 2014-09-27 (×6): 40 mg via ORAL
  Filled 2014-09-24 (×6): qty 1

## 2014-09-24 MED ORDER — ALUM & MAG HYDROXIDE-SIMETH 200-200-20 MG/5ML PO SUSP
30.0000 mL | Freq: Four times a day (QID) | ORAL | Status: DC | PRN
  Administered 2014-09-24: 30 mL via ORAL
  Filled 2014-09-24: qty 30

## 2014-09-24 MED FILL — Heparin Sodium (Porcine) Inj 1000 Unit/ML: INTRAMUSCULAR | Qty: 30 | Status: AC

## 2014-09-24 MED FILL — Sodium Chloride IV Soln 0.9%: INTRAVENOUS | Qty: 2000 | Status: AC

## 2014-09-24 NOTE — Evaluation (Signed)
Physical Therapy Evaluation Patient Details Name: Jacob Larsen MRN: [id number] DOB: 1951/06/19 Today's Date: 09/24/2014   History of Present Illness  patient is 64 yo male with lumbar pain with radiation to both legs, right worse han left. The only way for him to get around is to walk or sit with flexion of the spine to minimize the pain. Epidural has not helped him. PMH: foot drop bilaterally, BPH, OA, cx fusion, knee and shoulder surgeries. Plan is for PLIF L4-5  Clinical Impression  Pt admitted with above diagnosis. Pt currently with functional limitations due to the deficits listed below (see PT Problem List). Pt will benefit from skilled PT to increase their independence and safety with mobility to allow discharge to the venue listed below.       Follow Up Recommendations Home health PT;Supervision - Intermittent    Equipment Recommendations  Rolling walker with 5" wheels    Recommendations for Other Services       Precautions / Restrictions Precautions Precautions: Back Precaution Booklet Issued: Yes (comment) Required Braces or Orthoses: Spinal Brace Spinal Brace: Lumbar corset Restrictions Weight Bearing Restrictions: No      Mobility  Bed Mobility Overal bed mobility: Needs Assistance Bed Mobility: Rolling;Sit to Supine Rolling: Supervision     Sit to supine: Min assist   General bed mobility comments: min A to hel pt with legs into bed while maintaining SL to keep precautions  Transfers Overall transfer level: Needs assistance Equipment used: Rolling walker (2 wheeled);None Transfers: Sit to/from Stand Sit to Stand: Min guard         General transfer comment: min-guard for safety, pt with slow, deliberate movements. Practiced transfers with RW as well with vc's for hand placement  Ambulation/Gait Ambulation/Gait assistance: Min assist Ambulation Distance (Feet): 75 Feet Assistive device: None;Rolling walker (2 wheeled) Gait Pattern/deviations:  Trunk flexed;Antalgic;Step-through pattern Gait velocity: decreased   General Gait Details: pt began ambulation without AD with no balance deficits noted, however, pt had difficulty maintaining erect posture and was painful so RW given and pt tolerated ambulation much better with decreased pain and improved posture  Stairs            Wheelchair Mobility    Modified Rankin (Stroke Patients Only)       Balance Overall balance assessment: No apparent balance deficits (not formally assessed)                                           Pertinent Vitals/Pain Pain Assessment: 0-10 Pain Score: 6  Pain Location: back Pain Intervention(s): Limited activity within patient's tolerance;Monitored during session;PCA encouraged  VSS    Home Living Family/patient expects to be discharged to:: Private residence Living Arrangements: Spouse/significant other Available Help at Discharge: Family;Available 24 hours/day Type of Home: House Home Access: Stairs to enter Entrance Stairs-Rails: Right Entrance Stairs-Number of Steps: 3 Home Layout: One level Home Equipment: None Additional Comments: pt retired, likes to fish    Prior Function Level of Independence: Independent               Journalist, newspaper        Extremity/Trunk Assessment   Upper Extremity Assessment: Overall WFL for tasks assessed           Lower Extremity Assessment: Overall WFL for tasks assessed      Cervical / Trunk Assessment: Kyphotic  Communication  Communication: No difficulties  Cognition Arousal/Alertness: Awake/alert Behavior During Therapy: WFL for tasks assessed/performed Overall Cognitive Status: Within Functional Limits for tasks assessed                      General Comments      Exercises Other Exercises Other Exercises: abdominal stabilization in supine with knees bent to promote safe bed mobility      Assessment/Plan    PT Assessment Patient  needs continued PT services  PT Diagnosis Difficulty walking;Abnormality of gait;Acute pain   PT Problem List Decreased activity tolerance;Decreased mobility;Decreased knowledge of use of DME;Decreased knowledge of precautions;Pain  PT Treatment Interventions DME instruction;Gait training;Stair training;Functional mobility training;Therapeutic activities;Therapeutic exercise;Patient/family education   PT Goals (Current goals can be found in the Care Plan section) Acute Rehab PT Goals Patient Stated Goal: return to home and fishing PT Goal Formulation: With patient Time For Goal Achievement: 10/01/14 Potential to Achieve Goals: Good    Frequency Min 5X/week   Barriers to discharge        Co-evaluation               End of Session Equipment Utilized During Treatment: Gait belt;Back brace Activity Tolerance: Patient tolerated treatment well Patient left: in bed;with call bell/phone within reach;with bed alarm set Nurse Communication: Mobility status         Time: 4128-7867 PT Time Calculation (min) (ACUTE ONLY): 35 min   Charges:   PT Evaluation $Initial PT Evaluation Tier I: 1 Procedure PT Treatments $Gait Training: 8-22 mins $Therapeutic Activity: 8-22 mins   PT G Codes:       Leighton Roach, PT  Acute Rehab Services  [phone]  Leighton Roach 09/24/2014, 10:01 AM

## 2014-09-24 NOTE — Progress Notes (Signed)
Spoke with pharmacy regarding alternate medication for hiccups. Recommended low dose reglan. OK'd by MD.

## 2014-09-24 NOTE — Progress Notes (Addendum)
MD paged regarding patient's pulse. Pulse is 115 while at rest. BP 115/70, temp 98.1, SaO2 95% with 2 L [place]. Patient is alert and oriented X 4, incision dressing is clean dry and intact. Patient is now in bed, call bell is within patient's reach, will continue to monitor closely.

## 2014-09-24 NOTE — Op Note (Signed)
NAME:  Jacob Larsen, Jacob Larsen NO.:  [id number]  MEDICAL RECORD NO.:  [id number]  LOCATION:                                 FACILITY:  PHYSICIAN:  Leeroy Cha, M.D.   DATE OF BIRTH:  [birth year]  DATE OF PROCEDURE:  09/23/2014 DATE OF DISCHARGE:                              OPERATIVE REPORT   PREOPERATIVE DIAGNOSIS:  Degenerative disk disease, L4-L5 with chronic and acute lumbar radiculopathy.  POSTOPERATIVE DIAGNOSIS:  Degenerative disk disease, L4-L5 with chronic and acute lumbar radiculopathy.  PROCEDURE:  Bilateral L4 laminectomy and facetectomy, more than normal to remove the disk, which was done bilaterally at L4-L5 interbody fusion with a cage, expandable.  Pedicle screws at L4-L5.  Posterolateral arthrodesis with BMP and autograft.  Cell Saver.  C-arm.  SURGEON:  Leeroy Cha, M.D.  ASSISTANT:  Dr. Kathyrn Sheriff.  CLINICAL HISTORY:  Jacob Larsen is a gentleman, who was in my office because of back pain radiating to both legs.  The patient had been seen in pain clinic, and he had been getting epidural injections. Nevertheless, the pain continued to get worse.  It is mostly localized to the lumbar area associated with walking and the pain goes to both legs.  It came to the point that the patient is afraid to walk to minimize the pain.  MRI shows severe degenerative disk disease at the level of L4-L5.  Surgery was advised.  He knew the risk and benefits of the surgery.  PROCEDURE:  The patient was taken to the OR, and after intubation, he was positioned in a prone manner.  The back was cleaned with Betadine and DuraPrep.  Drapes were applied.  Midline incision from L4-L5 was made through the skin and subcutaneous tissue.  The muscle was dissected all the way laterally.  X-rays showed indeed that we were right at the L4-L5.  There was no question and the facet was a little loose and has a quite a bit of hypertrophy.  We proceeded with removal of  spinous process of L4, the lamina, and the facet.  Then, we retracted the thecal sac first in the right thigh and then in left thigh to do a total gross diskectomy more than normal to be able to introduce the cage.  The endplates were removed.  What I found immediately within the bone at the level of L4 and L5 were quite soft.  Two cages of 8 mm height were inset and were expanded up to 13.  The 2 cages had BMP and autograft.  The rest of the space was filled up with a mix of BMP and autograft.  From then on, using the C-arm first in AP view and then a lateral view, we probed the pedicle of L4-L5.  Prior to introduction of the screws, we filled all the 4 quadrants just to be sure that we were surrounded by bone.  Then, 4 screws of 5.5 x 45 were introduced and kept in place with rods and caps.  A cross-link was done from right to left.  Then we went laterally to the lateral aspect of the facet L4-L5 and the proximal transverse process and a mix of  BMP and autograft were used for the posterolateral arthrodesis.  The area was irrigated.  Valsalva maneuver was negative.  Then, the wound was closed with different layer of Vicryl and Steri-Strips.  A small Hemovac was left in the epidural space and the wound was sprinkled with vancomycin powder.          ______________________________ Leeroy Cha, M.D.     EB/MEDQ  D:  09/23/2014  T:  09/24/2014  Job:  [id number]

## 2014-09-24 NOTE — Progress Notes (Signed)
MD called back regarding patient's pulse. No new orders at this time. Will continue to monitor closely.

## 2014-09-24 NOTE — Evaluation (Signed)
Occupational Therapy Evaluation Patient Details Name: Jacob Larsen MRN: [id number] DOB: 15-Aug-1951 Today's Date: 09/24/2014    History of Present Illness patient is 64 yo male with lumbar pain with radiation to both legs, right worse han left. The only way for him to get around is to walk or sit with flexion of the spine to minimize the pain. Epidural has not helped him. PMH: foot drop bilaterally, BPH, OA, cx fusion, knee and shoulder surgeries. Plan is for PLIF L4-5   Clinical Impression   Patient admitted with above. Patient independent PTA. Patient currently functioning at an overall supervision>min assist level. Please see OT problem list below. Feel patient will benefit from acute OT to increase overall independence in the areas of ADLs & functional mobility and in order to safely discharge to venue listed below.      Follow Up Recommendations  Home health OT;Supervision/Assistance - 24 hour    Equipment Recommendations  3 in 1 bedside comode    Recommendations for Other Services  None at this time    Precautions / Restrictions Precautions Precautions: Back Precaution Booklet Issued: Yes (comment) Precaution Comments: Patient able to verbalize 1/3 back precautions. Therapist educated patient on all back precautions.  Required Braces or Orthoses: Spinal Brace Spinal Brace: Lumbar corset Restrictions Weight Bearing Restrictions: No      Mobility - Per PT evaluation  Bed Mobility Overal bed mobility: Needs Assistance Bed Mobility: Rolling;Sit to Supine Rolling: Supervision     Sit to supine: Min assist   General bed mobility comments: min A to hel pt with legs into bed while maintaining SL to keep precautions  Transfers Overall transfer level: Needs assistance Equipment used: Rolling walker (2 wheeled);None Transfers: Sit to/from Stand Sit to Stand: Min guard         General transfer comment: min-guard for safety, pt with slow, deliberate movements. Practiced  transfers with RW as well with vc's for hand placement    Balance - Per PT evaluation  Overall balance assessment: No apparent balance deficits (not formally assessed)    ADL Overall ADL's : Needs assistance/impaired Eating/Feeding: Independent   Grooming: Sitting;Set up;Wash/dry face;Wash/dry hands   Upper Body Bathing: Set up;Sitting;Cueing for safety   Lower Body Bathing: Minimal assistance;Sit to/from stand;Cueing for safety;Adhering to back precautions   Upper Body Dressing : Set up;Sitting;Cueing for safety   Lower Body Dressing: Minimal assistance;Cueing for safety;Sit to/from stand   Toilet Transfer: Minimal assistance;BSC;Stand-pivot;Cueing for safety           Functional mobility during ADLs: Minimal assistance General ADL Comments: Patient requires up to min assist for functional tasks at this time. Recommending HHOT to increase his independence > mod I and increase his overall quality of life.      Vision  Patient wears glasses all the time, no apparent deficits.    Perception Perception Perception Tested?: No   Praxis Praxis Praxis tested?: Within functional limits    Pertinent Vitals/Pain Pain Assessment: 0-10 Pain Score: 6  Pain Location: back Pain Intervention(s): Monitored during session     Hand Dominance Right   Extremity/Trunk Assessment Upper Extremity Assessment Upper Extremity Assessment: Overall WFL for tasks assessed   Lower Extremity Assessment Lower Extremity Assessment: Defer to PT evaluation   Cervical / Trunk Assessment Cervical / Trunk Assessment: Kyphotic   Communication Communication Communication: No difficulties   Cognition Arousal/Alertness: Awake/alert Behavior During Therapy: WFL for tasks assessed/performed Overall Cognitive Status: Within Functional Limits for tasks assessed  Exercises Exercises: Other exercises Other Exercises Other Exercises: abdominal stabilization in supine with knees bent to promote  safe bed mobility       Home Living Family/patient expects to be discharged to:: Private residence Living Arrangements: Spouse/significant other Available Help at Discharge: Family;Available 24 hours/day Type of Home: House Home Access: Stairs to enter [employer] of Steps: 3 Entrance Stairs-Rails: Right Home Layout: One level     Bathroom Shower/Tub: Walk-in shower;Door   [employer] Toilet: Programmer, systems: Yes   Home Equipment: None   Additional Comments: pt retired, likes to fish      Prior Functioning/Environment Level of Independence: Independent     OT Diagnosis: Generalized weakness;Acute pain   OT Problem List: Decreased strength;Decreased activity tolerance;Impaired balance (sitting and/or standing);Decreased coordination;Decreased safety awareness;Decreased knowledge of use of DME or AE;Decreased knowledge of precautions;Pain   OT Treatment/Interventions: Self-care/ADL training;Therapeutic exercise;Energy conservation;DME and/or AE instruction;Therapeutic activities;Patient/family education;Balance training    OT Goals(Current goals can be found in the care plan section) Acute Rehab OT Goals Patient Stated Goal: return to home and fishing OT Goal Formulation: With patient Time For Goal Achievement: 10/06/14 Potential to Achieve Goals: Good ADL Goals Pt Will Perform Grooming: Independently;standing Pt Will Perform Upper Body Bathing: Independently;sitting Pt Will Perform Lower Body Bathing: with modified independence;sit to/from stand Pt Will Perform Upper Body Dressing: Independently;sitting Pt Will Perform Lower Body Dressing: with modified independence;sit to/from stand Pt Will Transfer to Toilet: with modified independence;ambulating;bedside commode Pt Will Perform Toileting - Clothing Manipulation and hygiene: with modified independence;sit to/from stand Pt Will Perform Tub/Shower Transfer: with modified  independence;ambulating;shower seat;3 in 1;Shower transfer Additional ADL Goal #1: Patient will independently verbalize and adhere to 3/3 back precautions during ADLs and functional mobility  OT Frequency: Min 2X/week   Barriers to D/C:   None known at this time          End of Session Nurse Communication: Mobility status  Activity Tolerance: Patient tolerated treatment well Patient left: in chair;with call bell/phone within reach;with chair alarm set   Time: 1135-1200 OT Time Calculation (min): 25 min Charges:  OT General Charges $OT Visit: 1 Procedure OT Treatments $Self Care/Home Management : 8-22 mins  Tyechia Allmendinger , MS, OTR/L, CLT Pager: [phone]  09/24/2014, 12:49 PM

## 2014-09-24 NOTE — Progress Notes (Signed)
Patient gets tachycardic (120-130's) with movement and pain. Patient is now sitting in chair eating. No other symptoms. BP 122/77, RR 14, 97% SaO2 on room air. Will continue to monitor.

## 2014-09-24 NOTE — Progress Notes (Addendum)
5 mL of fentanyl 72mcg/mL wasted in sharps. Clement Sayres, RN wasted with RN.

## 2014-09-24 NOTE — Anesthesia Postprocedure Evaluation (Signed)
  Anesthesia Post-op Note  Patient: Jacob Larsen  Procedure(s) Performed: Procedure(s) with comments: Lumbar four-five posterior lumbar interbody fusion (N/A) - L4-5 Posterior lumbar interbody fusion  Patient Location: PACU  Anesthesia Type:General  Level of Consciousness: awake  Airway and Oxygen Therapy: Patient Spontanous Breathing  Post-op Pain: mild  Post-op Assessment: Post-op Vital signs reviewed, Patient's Cardiovascular Status Stable, Respiratory Function Stable, Patent Airway, No signs of Nausea or vomiting and Pain level controlled  Post-op Vital Signs: Reviewed and stable  Last Vitals:  Filed Vitals:   09/24/14 0934  BP: 108/66  Pulse: 54  Temp: 36.8 C  Resp: 20    Complications: No apparent anesthesia complications

## 2014-09-24 NOTE — Progress Notes (Signed)
Patient ID: Jacob Larsen, male   DOB: 11-08-1950, 64 y.o.   MRN: [id number] C/o incisional pain, no weakness,wound dry. Off fentanyl in am. Continue pt/ot

## 2014-09-24 NOTE — Progress Notes (Signed)
Utilization review completed. Juwan Vences, RN, BSN. 

## 2014-09-25 MED ORDER — OXYCODONE HCL 5 MG PO TABS
20.0000 mg | ORAL_TABLET | ORAL | Status: DC | PRN
  Administered 2014-09-25 – 2014-09-27 (×9): 20 mg via ORAL
  Filled 2014-09-25 (×9): qty 4

## 2014-09-25 NOTE — Progress Notes (Signed)
Patient ID: Jacob Larsen, male   DOB: 19-Jul-1951, 64 y.o.   MRN: [id number] Soreness all over, no weakness. Plan off with the hemovac

## 2014-09-25 NOTE — Progress Notes (Signed)
Patient had increased pain in both legs from his knees to his hips. Patient described pain as an aching/constant pain that was worsening. Pain medicine given to patient but patient only has relief when sitting on the edge of the bed. This RN encouraged patient to use PCA as needed. Will continue to monitor patient closely. Burnell Blanks, RN

## 2014-09-25 NOTE — Progress Notes (Signed)
Physical Therapy Treatment Patient Details Name: Jacob Larsen MRN: [id number] DOB: 04/29/1951 Today's Date: 09/25/2014    History of Present Illness      PT Comments    Pt. Was self-limiting due to pain. Pt. Would benefit from more gait training with RW as he had trouble turning and taking large steps. Will attempt steps next session.  Follow Up Recommendations  Home health PT;Supervision - Intermittent     Equipment Recommendations  Rolling walker with 5" wheels    Recommendations for Other Services       Precautions / Restrictions Precautions Precautions: Back Precaution Booklet Issued: Yes (comment) Required Braces or Orthoses: Spinal Brace Spinal Brace: Lumbar corset Restrictions Weight Bearing Restrictions: No    Mobility  Bed Mobility               General bed mobility comments: up on EOB upon arrival  Transfers Overall transfer level: Needs assistance Equipment used: Rolling walker (2 wheeled);None Transfers: Sit to/from Stand Sit to Stand: Min assist         General transfer comment: min A as patient tried three times before he asked for help. Pt. knew hand placement. Would benefit from one hand on bed and one hand on walker next time.  Ambulation/Gait Ambulation/Gait assistance: Min assist Ambulation Distance (Feet): 10 Feet Assistive device: Rolling walker (2 wheeled) Gait Pattern/deviations: Step-to pattern;Decreased step length - right;Antalgic;Trunk flexed Gait velocity: decreased Gait velocity interpretation: Below normal speed for age/gender General Gait Details: pt had difficulty maintaining an upright posture. vc's for posture. pt c/o of pain    Stairs            Wheelchair Mobility    Modified Rankin (Stroke Patients Only)       Balance                                    Cognition Arousal/Alertness: Awake/alert Behavior During Therapy: WFL for tasks assessed/performed Overall Cognitive Status: Within  Functional Limits for tasks assessed                      Exercises      General Comments        Pertinent Vitals/Pain Pain Assessment: Faces Pain Location: back Pain Descriptors / Indicators: Grimacing Pain Intervention(s): Premedicated before session;Limited activity within patient's tolerance    Home Living                      Prior Function            PT Goals (current goals can now be found in the care plan section) Progress towards PT goals: Progressing toward goals    Frequency  Min 5X/week    PT Plan Current plan remains appropriate    Co-evaluation             End of Session Equipment Utilized During Treatment: Back brace Activity Tolerance: Patient limited by pain Patient left: in bed;with call bell/phone within reach;with family/visitor present     Time: 1025-1040 PT Time Calculation (min) (ACUTE ONLY): 15 min  Charges:                       G Codes:      Jodi Geralds, [place] 09/25/2014, 11:49 AM

## 2014-09-25 NOTE — Progress Notes (Signed)
Pt's Fentanyl PCA was D/C'd, wasted 22cc. in the sink. Mahala Menghini, RN witnessed the waste. Holli Humbles, RN

## 2014-09-26 ENCOUNTER — Inpatient Hospital Stay (HOSPITAL_COMMUNITY): Payer: 59

## 2014-09-26 MED ORDER — BISACODYL 10 MG RE SUPP
10.0000 mg | Freq: Every day | RECTAL | Status: DC | PRN
  Administered 2014-09-26: 10 mg via RECTAL
  Filled 2014-09-26: qty 1

## 2014-09-26 MED ORDER — DEXAMETHASONE SODIUM PHOSPHATE 4 MG/ML IJ SOLN
4.0000 mg | Freq: Four times a day (QID) | INTRAMUSCULAR | Status: DC
  Administered 2014-09-26 – 2014-09-27 (×5): 4 mg via INTRAVENOUS
  Filled 2014-09-26 (×5): qty 1

## 2014-09-26 NOTE — Progress Notes (Signed)
Occupational Therapy Treatment Patient Details Name: Jacob Larsen MRN: [id number] DOB: October 21, 1950 Today's Date: 09/26/2014    History of present illness underwent PLIF L4-5   OT comments  Pt making slow progress. Appears limited by pain. Focus of session on education for compensatory techniques and use of available AE for LB ADL. Educated pt/wife on transfer techniques to toilet and walk in shower. Will attempt to see in am. Continue to recommend [facility] after D/C.   Follow Up Recommendations  Home health OT;Supervision/Assistance - 24 hour    Equipment Recommendations  3 in 1 bedside comode    Recommendations for Other Services      Precautions / Restrictions Precautions Precautions: Back Precaution Booklet Issued: Yes (comment) Precaution Comments: able to verbalize 2/3 precautions Required Braces or Orthoses: Spinal Brace Spinal Brace: Lumbar corset Restrictions Weight Bearing Restrictions: No       Mobility        Transfers Overall transfer level: Needs assistance Equipment used: Rolling walker (2 wheeled) Transfers: Sit to/from [facility] Sit to Stand: Min assist Stand pivot transfers: Min assist       General transfer comment: requires max encouragement; vc to push up from chair and control descent when sitting    Balance Overall balance assessment: Needs assistance         Standing balance support: During functional activity Standing balance-Leahy Scale: Fair                     ADL                                       Functional mobility during ADLs: Minimal assistance (Ambulated @100  ft during functional mobility) to review safety during ADL. Pt began he "had to sit" after walking @ 20 ft.  General ADL Comments: Educated pt/wife on availability of AE for ADL. Rec use of reacher for LB D and sponge for B. Rec for pt to tie bag onto walker and always have reacher with him to increase adherance to back  precautions. Wife states she will assist with ADL as needed. also educated pt on technique for hygiene after toileting. Pt able to return demonstrate.  Pt able to verbalize back precautions however not able to demonstrate during ADL.                                      Cognition   Behavior During Therapy: WFL for tasks assessed/performed Overall Cognitive Status: Within Functional Limits for tasks assessed                                                 General Comments      Pertinent Vitals/ Pain       Pain Assessment: 0-10 Pain Score: 7  Pain Location: back Pain Descriptors / Indicators: Aching;Moaning;Grimacing Pain Intervention(s): Limited activity within patient's tolerance;Monitored during session;Repositioned;Premedicated before session  Home Living                                          Prior Functioning/Environment  Frequency Min 2X/week     Progress Toward Goals  OT Goals(current goals can now be found in the care plan section)  Progress towards OT goals: Progressing toward goals  Acute Rehab OT Goals Patient Stated Goal: return to home and fishing OT Goal Formulation: With patient Time For Goal Achievement: 10/06/14 Potential to Achieve Goals: Good ADL Goals Pt Will Perform Grooming: Independently;standing Pt Will Perform Upper Body Bathing: Independently;sitting Pt Will Perform Lower Body Bathing: with modified independence;sit to/from stand Pt Will Perform Upper Body Dressing: Independently;sitting Pt Will Perform Lower Body Dressing: with modified independence;sit to/from stand Pt Will Transfer to Toilet: with modified independence;ambulating;bedside commode Pt Will Perform Toileting - Clothing Manipulation and hygiene: with modified independence;sit to/from stand Pt Will Perform Tub/Shower Transfer: with modified independence;ambulating;shower seat;3 in 1;Shower transfer Additional  ADL Goal #1: Patient will independently verbalize and adhere to 3/3 back precautions during ADLs and functional mobility  Plan Discharge plan remains appropriate    Co-evaluation                 End of Session Equipment Utilized During Treatment: Gait belt;Rolling walker;Back brace   Activity Tolerance Patient tolerated treatment well   Patient Left in chair;with call bell/phone within reach;with family/visitor present   Nurse Communication Mobility status        Time: 4967-5916 OT Time Calculation (min): 42 min  Charges: OT General Charges $OT Visit: 1 Procedure OT Treatments $Self Care/Home Management : 38-52 mins  Ariany Kesselman,H[place]ARY 09/26/2014, 4:20 PM   [facility], OTR/L  [phone] 09/26/2014

## 2014-09-26 NOTE — Progress Notes (Signed)
Patient ID: Jacob Larsen, male   DOB: 11-01-1950, 64 y.o.   MRN: [id number] complins of incisional pain, constipation. No weakness. See orders

## 2014-09-26 NOTE — Progress Notes (Signed)
Physical Therapy Treatment Patient Details Name: Jacob Larsen MRN: [id number] DOB: 1951/01/26 Today's Date: 09/26/2014    History of Present Illness patient is 64 yo male with lumbar pain with radiation to both legs, right worse han left. The only way for him to get around is to walk or sit with flexion of the spine to minimize the pain. Epidural has not helped him. PMH: foot drop bilaterally, BPH, OA, cx fusion, knee and shoulder surgeries. Plan is for PLIF L4-5    PT Comments    Pt. Was self-limiting due to pain but did walk a longer distance than last session. Pt. Would benefit from more gait training and stair training as pain lessens. Pt. Needs more strengthening so he can do sit to stand transfers more independently so wife does not need to help as much upon d/c to home.  Follow Up Recommendations  Home health PT;Supervision - Intermittent     Equipment Recommendations  Rolling walker with 5" wheels    Recommendations for Other Services       Precautions / Restrictions Precautions Precautions: Back Precaution Booklet Issued: Yes (comment) Required Braces or Orthoses: Spinal Brace Spinal Brace: Lumbar corset Restrictions Weight Bearing Restrictions: No    Mobility  Bed Mobility Overal bed mobility: Needs Assistance Bed Mobility: Rolling;Sit to Supine Rolling: Supervision         General bed mobility comments: min assist to get trunk upright  Transfers Overall transfer level: Needs assistance Equipment used: Rolling walker (2 wheeled) Transfers: Sit to/from Stand Sit to Stand: Mod assist         General transfer comment: mod A to pull pt. up when he got started and to steady when he got up to stand; did better with one hand on bed and one on the RW  Ambulation/Gait Ambulation/Gait assistance: Min guard;Supervision Ambulation Distance (Feet): 80 Feet Assistive device: Rolling walker (2 wheeled) Gait Pattern/deviations: Step-through pattern;Trunk  flexed Gait velocity: decreased Gait velocity interpretation: Below normal speed for age/gender General Gait Details: pt had difficulty maintaining an upright posture. vc's for posture. pt c/o of pain. Rest break x 1 and needed to be pushed to neuro gym. walked back to room.   Stairs Stairs: Yes Stairs assistance: Min guard Stair Management: Two rails;Alternating pattern;Forwards Number of Stairs: 3 General stair comments: needed UE support of the rails to assist to pull up.   Wheelchair Mobility    Modified Rankin (Stroke Patients Only)       Balance                                    Cognition Arousal/Alertness: Awake/alert Behavior During Therapy: WFL for tasks assessed/performed Overall Cognitive Status: Within Functional Limits for tasks assessed                      Exercises      General Comments        Pertinent Vitals/Pain Pain Assessment: 0-10 Pain Score: 6  Pain Location: back Pain Intervention(s): Premedicated before session;Monitored during session;Limited activity within patient's tolerance    Home Living                      Prior Function            PT Goals (current goals can now be found in the care plan section) Progress towards PT goals: Progressing toward goals  Frequency  Min 5X/week    PT Plan Current plan remains appropriate    Co-evaluation             End of Session Equipment Utilized During Treatment: Back brace Activity Tolerance: Patient limited by pain Patient left: in chair;with call bell/phone within reach;with family/visitor present     Time: 1142-1205 PT Time Calculation (min) (ACUTE ONLY): 23 min  Charges:                       G Codes:      Jodi Geralds, [place] 09/26/2014, 12:56 PM

## 2014-09-26 NOTE — Progress Notes (Signed)
Dr. Vertell Limber paged regarding pt's temp of 102.1 and diminished breath sounds. Pt 02 sats 92 on room air, 2 L [place] placed. MD ordered blood cultures to be drawn and chest xray to be done. Pt using incentive spirometer at this time; achieved 1500. Will continue to monitor. Pt denies any shortness of breath at this time. Elspeth Cho, RN 09/26/2013 [phone]

## 2014-09-27 NOTE — Progress Notes (Signed)
Discharge orders received, pt for discharge home today with home health PT per [facility], IV D/C with dressing to lower back.  D/C instructions and Rx given with verbalized understanding.  Family at bedside to assist pt with discharge. Staff brought pt downstairs via wheelchair.

## 2014-09-27 NOTE — Care Management Note (Signed)
    Page 1 of 1   09/27/2014     3:11:27 PM CARE MANAGEMENT NOTE 09/27/2014  Patient:  Jacob Larsen   Account Number:  [id number]  Date Initiated:  09/27/2014  Documentation initiated by:  Jacob Larsen  Subjective/Objective Assessment:   Lumbar degenerative disc disease    L4-5 fusion     Action/Plan:   Home w/ PT, OT, RW and 3 in 1.   Anticipated DC Date:  09/27/2014   Anticipated DC Plan:  [facility]  CM consult      [place]   Choice offered to / List presented to:  C-1 Patient   DME arranged  [place]  3-N-1      DME agency  [place] arranged  HH-3 OT  HH-2 PT      [place].   Status of service:  Completed, signed off  Discharge Disposition:  [facility]  Comments:  09/27/14  [place]  [place] Notified of need for [name].  Order noted for home PT/OT and RW and 3-N-1.  Spoke w/ pt at bedside in room 4N06C. Verified home address and phone number as correct on face sheet, wife's cell number is [phone] Jacob Larsen). Discussed HHC and agencies, choice offered, no preference. He did have questions about his co-pay and CM explained that [facility] would give him that information.  Referral made to [facility].  CM notified Jacob Larsen w/ [facility] about equipment needs and this will be delivered to the pt's room prior to dc. [facility] will call pt to make arranagements for therapy.  CM available to assist as needed.  Nurse aware of dc plan.

## 2014-09-27 NOTE — Progress Notes (Signed)
Physical Therapy Treatment Patient Details Name: Jacob Larsen MRN: [id number] DOB: Nov 11, 1950 Today's Date: 09/27/2014    History of Present Illness underwent PLIF L4-5    PT Comments    Patient is progressing very well this morning and is more motivated to progress than in previous session. Patient able to practice steps and increase ambulation. Patient safe to D/C from a mobility standpoint based on progression towards goals set on PT eval.    Follow Up Recommendations  Home health PT;Supervision - Intermittent     Equipment Recommendations  Rolling walker with 5" wheels    Recommendations for Other Services       Precautions / Restrictions Precautions Precautions: Back Precaution Comments: Patient able to recall all precautions Required Braces or Orthoses: Spinal Brace Spinal Brace: Lumbar corset Restrictions Weight Bearing Restrictions: No    Mobility  Bed Mobility               General bed mobility comments: Patient in recliner before and after session  Transfers Overall transfer level: Needs assistance Equipment used: Rolling walker (2 wheeled)   Sit to Stand: Min guard         General transfer comment: Patient with safe technique. Minguard for safety  Ambulation/Gait Ambulation/Gait assistance: Supervision Ambulation Distance (Feet): 200 Feet Assistive device: Rolling walker (2 wheeled) Gait Pattern/deviations: Step-through pattern;Decreased stride length;Trunk flexed Gait velocity: decreased and guarded Gait velocity interpretation: Below normal speed for age/gender General Gait Details: Cues to relax shoulders and increase weight through LEs. Patient guarded but much safer use of RW this session   Stairs Stairs: Yes Stairs assistance: Min guard Stair Management: Step to pattern;Forwards;Two rails Number of Stairs: 5 General stair comments: Patient with safe technique. No LOB  Wheelchair Mobility    Modified Rankin (Stroke Patients  Only)       Balance                                    Cognition Arousal/Alertness: Awake/alert Behavior During Therapy: WFL for tasks assessed/performed Overall Cognitive Status: Within Functional Limits for tasks assessed                      Exercises      General Comments        Pertinent Vitals/Pain Pain Score: 5  Pain Location: back, top of buttocks Pain Descriptors / Indicators: Sore Pain Intervention(s): Monitored during session    Home Living                      Prior Function            PT Goals (current goals can now be found in the care plan section) Progress towards PT goals: Progressing toward goals    Frequency  Min 5X/week    PT Plan Current plan remains appropriate    Co-evaluation             End of Session Equipment Utilized During Treatment: Back brace Activity Tolerance: Patient tolerated treatment well Patient left: in chair;with call bell/phone within reach     Time: 0850-0914 PT Time Calculation (min) (ACUTE ONLY): 24 min  Charges:  $Gait Training: 8-22 mins $Therapeutic Activity: 8-22 mins                    G Codes:      Jacqualyn Posey 09/27/2014, 10:08 AM  09/27/2014 Jacqualyn Posey PTA [phone] pager [phone] office

## 2014-09-27 NOTE — Discharge Summary (Signed)
Physician Discharge Summary  Patient ID: Jacob Larsen MRN: [id number] DOB/AGE: September 15, 1950 64 y.o.  Admit date: 09/23/2014 Discharge date: 09/27/2014  Admission Diagnoses:lumbar degenerative disc disease  Discharge Diagnoses:  Active Problems:   Lumbar degenerative disc disease   Discharged Condition: incisional pain, no weakness  Hospital Course: surgery  Consults: none  Significant Diagnostic Studies: mri  Treatments: l4-5 fusion  Discharge Exam: Blood pressure 131/83, pulse 91, temperature 98.8 F (37.1 C), temperature source Oral, resp. rate 18, height 6\' 2"  (1.88 m), weight 93.895 kg (207 lb), SpO2 91 %. Ambulating, no weakness  Disposition: 01-Home or Self Care     Medication List    ASK your doctor about these medications        atorvastatin 80 MG tablet  Commonly known as:  LIPITOR  Take 1 tablet (80 mg total) by mouth daily. For cholesterol     diclofenac 75 MG EC tablet  Commonly known as:  VOLTAREN  TAKE 1 TABLET BY MOUTH TWICE A DAY     Flax Seed Oil 1000 MG Caps  Take 1 capsule (1,000 mg total) by mouth 2 (two) times daily.     fluticasone 50 MCG/ACT nasal spray  Commonly known as:  FLONASE  Place 1 spray into both nostrils daily.     gabapentin 300 MG capsule  Commonly known as:  NEURONTIN  Take 300 mg by mouth 3 (three) times daily as needed (pain).     HYDROcodone-acetaminophen 10-325 MG per tablet  Commonly known as:  NORCO  Take 1 tablet by mouth every 6 (six) hours as needed (pain).     loratadine 10 MG tablet  Commonly known as:  CLARITIN  Take 10 mg by mouth as needed for allergies.     Magnesium 250 MG Tabs  Take 250 mg by mouth daily.     omeprazole 20 MG capsule  Commonly known as:  PRILOSEC  Take 1 capsule (20 mg total) by mouth 2 (two) times daily before a meal.     tamsulosin 0.4 MG Caps capsule  Commonly known as:  FLOMAX  Take 1 capsule (0.4 mg total) by mouth daily before breakfast.     testosterone cypionate 200  MG/ML injection  Commonly known as:  DEPOTESTOTERONE CYPIONATE  INJECT 2 MLS INTRAMUSCULARLY EVERY 2 WEEKS     VITAMIN B-12 PO  Take 1 tablet by mouth daily.     Vitamin D 2000 UNITS tablet  Take 4,000 Units by mouth daily.         Signed: Floyce Stakes 09/27/2014, 9:32 AM

## 2014-10-02 LAB — CULTURE, BLOOD (ROUTINE X 2)
CULTURE: NO GROWTH
Culture: NO GROWTH

## 2014-10-21 DIAGNOSIS — M549 Dorsalgia, unspecified: Secondary | ICD-10-CM | POA: Insufficient documentation

## 2014-10-28 ENCOUNTER — Other Ambulatory Visit: Payer: Self-pay | Admitting: Internal Medicine

## 2014-11-06 ENCOUNTER — Ambulatory Visit (INDEPENDENT_AMBULATORY_CARE_PROVIDER_SITE_OTHER): Payer: 59 | Admitting: Physician Assistant

## 2014-11-06 ENCOUNTER — Encounter: Payer: Self-pay | Admitting: Physician Assistant

## 2014-11-06 VITALS — BP 120/78 | HR 76 | Temp 98.1°F | Resp 16 | Ht 73.5 in | Wt 196.0 lb

## 2014-11-06 DIAGNOSIS — E291 Testicular hypofunction: Secondary | ICD-10-CM

## 2014-11-06 DIAGNOSIS — Z711 Person with feared health complaint in whom no diagnosis is made: Secondary | ICD-10-CM

## 2014-11-06 DIAGNOSIS — N390 Urinary tract infection, site not specified: Secondary | ICD-10-CM

## 2014-11-06 DIAGNOSIS — E559 Vitamin D deficiency, unspecified: Secondary | ICD-10-CM

## 2014-11-06 DIAGNOSIS — K2100 Gastro-esophageal reflux disease with esophagitis, without bleeding: Secondary | ICD-10-CM

## 2014-11-06 DIAGNOSIS — E349 Endocrine disorder, unspecified: Secondary | ICD-10-CM | POA: Insufficient documentation

## 2014-11-06 DIAGNOSIS — T83511A Infection and inflammatory reaction due to indwelling urethral catheter, initial encounter: Secondary | ICD-10-CM

## 2014-11-06 DIAGNOSIS — R7303 Prediabetes: Secondary | ICD-10-CM

## 2014-11-06 DIAGNOSIS — I10 Essential (primary) hypertension: Secondary | ICD-10-CM

## 2014-11-06 DIAGNOSIS — E782 Mixed hyperlipidemia: Secondary | ICD-10-CM

## 2014-11-06 DIAGNOSIS — Z79899 Other long term (current) drug therapy: Secondary | ICD-10-CM

## 2014-11-06 DIAGNOSIS — R7309 Other abnormal glucose: Secondary | ICD-10-CM

## 2014-11-06 LAB — CBC WITH DIFFERENTIAL/PLATELET
BASOS ABS: 0 10*3/uL (ref 0.0–0.1)
BASOS PCT: 1 % (ref 0–1)
EOS ABS: 0.1 10*3/uL (ref 0.0–0.7)
EOS PCT: 2 % (ref 0–5)
HEMATOCRIT: 44.6 % (ref 39.0–52.0)
HEMOGLOBIN: 14.9 g/dL (ref 13.0–17.0)
Lymphocytes Relative: 34 % (ref 12–46)
Lymphs Abs: 1.4 10*3/uL (ref 0.7–4.0)
MCH: 31.8 pg (ref 26.0–34.0)
MCHC: 33.4 g/dL (ref 30.0–36.0)
MCV: 95.3 fL (ref 78.0–100.0)
MONO ABS: 0.4 10*3/uL (ref 0.1–1.0)
MPV: 10.1 fL (ref 8.6–12.4)
Monocytes Relative: 11 % (ref 3–12)
Neutro Abs: 2.1 10*3/uL (ref 1.7–7.7)
Neutrophils Relative %: 52 % (ref 43–77)
PLATELETS: 278 10*3/uL (ref 150–400)
RBC: 4.68 MIL/uL (ref 4.22–5.81)
RDW: 14 % (ref 11.5–15.5)
WBC: 4 10*3/uL (ref 4.0–10.5)

## 2014-11-06 LAB — LIPID PANEL
CHOLESTEROL: 146 mg/dL (ref 0–200)
HDL: 39 mg/dL — ABNORMAL LOW (ref >=40)
LDL Cholesterol: 89 mg/dL (ref 0–99)
TRIGLYCERIDES: 90 mg/dL (ref <150)
Total CHOL/HDL Ratio: 3.7 Ratio
VLDL: 18 mg/dL (ref 0–40)

## 2014-11-06 LAB — HEPATIC FUNCTION PANEL
ALT: 14 U/L (ref 0–53)
AST: 15 U/L (ref 0–37)
Albumin: 4 g/dL (ref 3.5–5.2)
Alkaline Phosphatase: 106 U/L (ref 39–117)
BILIRUBIN INDIRECT: 0.6 mg/dL (ref 0.2–1.2)
Bilirubin, Direct: 0.1 mg/dL (ref 0.0–0.3)
Total Bilirubin: 0.7 mg/dL (ref 0.2–1.2)
Total Protein: 7.1 g/dL (ref 6.0–8.3)

## 2014-11-06 LAB — HEMOGLOBIN A1C
HEMOGLOBIN A1C: 5.7 % — AB (ref <5.7)
Mean Plasma Glucose: 117 mg/dL — ABNORMAL HIGH (ref <117)

## 2014-11-06 LAB — MAGNESIUM: Magnesium: 1.8 mg/dL (ref 1.5–2.5)

## 2014-11-06 LAB — BASIC METABOLIC PANEL WITH GFR
BUN: 10 mg/dL (ref 6–23)
CO2: 25 meq/L (ref 19–32)
Calcium: 9.4 mg/dL (ref 8.4–10.5)
Chloride: 104 mEq/L (ref 96–112)
Creat: 1.01 mg/dL (ref 0.50–1.35)
GFR, Est African American: >89 mL/min
GFR, Est Non African American: 78 mL/min
Glucose, Bld: 86 mg/dL (ref 70–99)
Potassium: 3.9 mEq/L (ref 3.5–5.3)
Sodium: 138 mEq/L (ref 135–145)

## 2014-11-06 LAB — TSH: TSH: 2.975 u[IU]/mL (ref 0.350–4.500)

## 2014-11-06 MED ORDER — RANITIDINE HCL 300 MG PO TABS: ORAL_TABLET | ORAL | Status: DC

## 2014-11-06 NOTE — Progress Notes (Signed)
Assessment and Plan:  Hypertension: Continue medication, monitor blood pressure at home. Continue DASH diet.  Reminder to go to the ER if any CP, SOB, nausea, dizziness, severe HA, changes vision/speech, left arm numbness and tingling, and jaw pain. Cholesterol: Continue diet and exercise. Check cholesterol.  Pre-diabetes-Continue diet and exercise. Check A1C Vitamin D Def- check level and continue medications.  Urinary frequency- check UA C&S STD testing- will check panel  Continue diet and meds as discussed. Further disposition pending results of labs.  HPI 64 y.o. male  presents for 3 month follow up with hypertension, hyperlipidemia, prediabetes and vitamin D.  His blood pressure has been controlled at home, today their BP is BP: 120/78 mmHg  He does not workout. He denies chest pain, shortness of breath, dizziness.  He is on cholesterol medication and denies myalgias. His cholesterol is at goal. The cholesterol last visit was:   Lab Results  Component Value Date   CHOL 156 05/27/2014   HDL 47 05/27/2014   LDLCALC 83 05/27/2014   TRIG 131 05/27/2014   CHOLHDL 3.3 05/27/2014   He has been working on diet and exercise for prediabetes, and denies polydipsia, polyuria and visual disturbances. Last A1C in the office was:  Lab Results  Component Value Date   HGBA1C 6.0* 05/27/2014  Patient is on Vitamin D supplement.   Lab Results  Component Value Date   VD25OH 66 05/27/2014    He has a history of testosterone deficiency and is on testosterone replacement. He states that the testosterone helps with his energy, libido, muscle mass. Lab Results  Component Value Date   TESTOSTERONE 1820.69* 05/27/2014  He had a fusion with Dr. Joya Salm of L4/L5 on 09/23/2014. He states since the catheter it feels like something is different with his urine, some discomfort with urination, has improved. Denies dysuria, hesitancy, dribbling, frequency. He would also like STD testing.   Current Medications:   Current Outpatient Prescriptions on File Prior to Visit  Medication Sig Dispense Refill  . atorvastatin (LIPITOR) 80 MG tablet TAKE 1 TABLET (80 MG TOTAL) BY MOUTH DAILY. FOR CHOLESTEROL 30 tablet 3  . Cholecalciferol (VITAMIN D) 2000 UNITS tablet Take 4,000 Units by mouth daily.     . Cyanocobalamin (VITAMIN B-12 PO) Take 1 tablet by mouth daily.    . diclofenac (VOLTAREN) 75 MG EC tablet TAKE 1 TABLET BY MOUTH TWICE A DAY 180 tablet 1  . Flaxseed, Linseed, (FLAX SEED OIL) 1000 MG CAPS Take 1 capsule (1,000 mg total) by mouth 2 (two) times daily.  0  . fluticasone (FLONASE) 50 MCG/ACT nasal spray Place 1 spray into both nostrils daily. 16 g 2  . gabapentin (NEURONTIN) 300 MG capsule Take 300 mg by mouth 3 (three) times daily as needed (pain).     Marland Kitchen HYDROcodone-acetaminophen (NORCO) 10-325 MG per tablet Take 1 tablet by mouth every 6 (six) hours as needed (pain).    Marland Kitchen loratadine (CLARITIN) 10 MG tablet Take 10 mg by mouth as needed for allergies.     . Magnesium 250 MG TABS Take 250 mg by mouth daily.    Marland Kitchen omeprazole (PRILOSEC) 20 MG capsule Take 1 capsule (20 mg total) by mouth 2 (two) times daily before a meal. 180 capsule 6  . tamsulosin (FLOMAX) 0.4 MG CAPS capsule Take 1 capsule (0.4 mg total) by mouth daily before breakfast. 90 capsule 1  . testosterone cypionate (DEPOTESTOTERONE CYPIONATE) 200 MG/ML injection INJECT 2 MLS INTRAMUSCULARLY EVERY 2 WEEKS 10 mL 0  No current facility-administered medications on file prior to visit.   Medical History:  Past Medical History  Diagnosis Date  . Seasonal allergies   . Hyperlipidemia   . BPH (benign prostatic hyperplasia)   . Hypogonadism male   . ED (erectile dysfunction)   . Foot drop     both feet  . Numbness     only in 4th and 5th finger on left hand  . Arthritis     in neck  . Chronic back pain     DDD  . GERD (gastroesophageal reflux disease)     takes Omeprazole daily  . Diverticulosis   . Urinary frequency     takes  Flomax daily  . Enlarged prostate   . Nocturia    Allergies:  Allergies  Allergen Reactions  . Salicylates Rash  . Aspirin     REACTION: rash  . Other     Beef-hives    Review of Systems:  Review of Systems  Constitutional: Negative.   HENT: Negative.   Respiratory: Negative.   Cardiovascular: Negative.   Gastrointestinal: Positive for heartburn (controlled with prilosec). Negative for nausea, vomiting, abdominal pain, diarrhea, constipation, blood in stool and melena.  Genitourinary: Positive for dysuria. Negative for urgency, frequency, hematuria and flank pain.  Musculoskeletal: Positive for back pain. Negative for myalgias, joint pain, falls and neck pain.  Skin: Negative.   Neurological: Negative.   Psychiatric/Behavioral: Negative.     Family history- Review and unchanged Social history- Review and unchanged Physical Exam: BP 120/78 mmHg  Pulse 76  Temp(Src) 98.1 F (36.7 C)  Resp 16  Ht 6' 1.5" (1.867 m)  Wt 196 lb (88.905 kg)  BMI 25.51 kg/m2 Wt Readings from Last 3 Encounters:  11/06/14 196 lb (88.905 kg)  09/23/14 207 lb (93.895 kg)  05/27/14 206 lb (93.441 kg)   General Appearance: Well nourished, in no apparent distress. Eyes: PERRLA, EOMs, conjunctiva no swelling or erythema Sinuses: No Frontal/maxillary tenderness ENT/Mouth: Ext aud canals clear, TMs without erythema, bulging. No erythema, swelling, or exudate on post pharynx.  Tonsils not swollen or erythematous. Hearing normal.  Neck: Supple, thyroid normal.  Respiratory: Respiratory effort normal, BS equal bilaterally without rales, rhonchi, wheezing or stridor.  Cardio: RRR with no MRGs. Brisk peripheral pulses without edema.  Abdomen: Soft, + BS,  Non tender, no guarding, rebound, hernias, masses. Lymphatics: Non tender without lymphadenopathy.  Musculoskeletal: Full ROM except back is in brace and has decreased ROM, 5/5 strength, Antalgic gait Skin: Warm, dry without rashes, lesions,  ecchymosis.  Neuro: Cranial nerves intact. Normal muscle tone, no cerebellar symptoms. Psych: Awake and oriented X 3, normal affect, Insight and Judgment appropriate.    Vicie Mutters, PA-C 11:21 AM [facility]

## 2014-11-06 NOTE — Patient Instructions (Addendum)
Nexium/protonix/prilosec are called PPI's, they are great at healing your stomach but should only be taken for a short period of time.   Studies are showing that taken for a long time it can increase the risk of osteoporosis (weakening of your bones), pneumonia, low magnesium, restless legs, Cdiff (infection that causes diarrhea), and most recently kidney disease/insufficiency.  Due to this information we want to try to stop the PPI but if you try to stop it abruptly this can cause rebound acid and worsening symptoms.   So this is how we want you to get off the PPI: - Start taking the nexium/protonix/prilosec or which every PPI you are on every other day for 2 week while starting to take pepcid or zantac (generic is fine) 2 x a day - then decrease the PPI to every 3 days for 2 weeks and then stop while continuing on the zantac or pepcid twice daily. - then you can try once at night for 2 weeks - you can continue on this once at night or stop all together - Avoid alcohol, spicy foods, NSAIDS (aleve, ibuprofen) at this time. See foods below.   Food Choices for Gastroesophageal Reflux Disease When you have gastroesophageal reflux disease (GERD), the foods you eat and your eating habits are very important. Choosing the right foods can help ease the discomfort of GERD. WHAT GENERAL GUIDELINES DO I NEED TO FOLLOW? 1. Choose fruits, vegetables, whole grains, low-fat dairy products, and low-fat meat, fish, and poultry. 2. Limit fats such as oils, salad dressings, butter, nuts, and avocado. 3. Keep a food diary to identify foods that cause symptoms. 4. Avoid foods that cause reflux. These may be different for different people. 5. Eat frequent small meals instead of three large meals each day. 6. Eat your meals slowly, in a relaxed setting. 7. Limit fried foods. 8. Cook foods using methods other than frying. 9. Avoid drinking alcohol. 10. Avoid drinking large amounts of liquids with your  meals. 11. Avoid bending over or lying down until 2-3 hours after eating. WHAT FOODS ARE NOT RECOMMENDED? The following are some foods and drinks that may worsen your symptoms: Vegetables Tomatoes. Tomato juice. Tomato and spaghetti sauce. Chili peppers. Onion and garlic. Horseradish. Fruits Oranges, grapefruit, and lemon (fruit and juice). Meats  High-fat meats, fish, and poultry. This includes hot dogs, ribs, ham, sausage, salami, and bacon. Dairy Whole milk and chocolate milk. Sour cream. Cream. Butter. Ice cream. Cream cheese.  Beverages Coffee and tea, with or without caffeine. Carbonated beverages or energy drinks. Condiments Hot sauce. Barbecue sauce.  Sweets/Desserts Chocolate and cocoa. Donuts. Peppermint and spearmint. Fats and Oils High-fat foods, including [place] fries and potato chips. Other Vinegar. Strong spices, such as black pepper, white pepper, red pepper, cayenne, curry powder, cloves, ginger, and chili powder.     Bad carbs also include fruit juice, alcohol, and sweet tea. These are empty calories that do not signal to your brain that you are full.   Please remember the good carbs are still carbs which convert into sugar. So please measure them out no more than 1/2-1 cup of rice, oatmeal, pasta, and beans  Veggies are however free foods! Pile them on.   Not all fruit is created equal. Please see the list below, the fruit at the bottom is higher in sugars than the fruit at the top. Please avoid all dried fruits.

## 2014-11-07 LAB — GC/CHLAMYDIA PROBE AMP, URINE
Chlamydia, Swab/Urine, PCR: NEGATIVE
GC Probe Amp, Urine: NEGATIVE

## 2014-11-07 LAB — URINALYSIS, ROUTINE W REFLEX MICROSCOPIC
Bilirubin Urine: NEGATIVE
Glucose, UA: NEGATIVE mg/dL
Hgb urine dipstick: NEGATIVE
Ketones, ur: NEGATIVE mg/dL
LEUKOCYTES UA: NEGATIVE
NITRITE: NEGATIVE
PROTEIN: NEGATIVE mg/dL
Specific Gravity, Urine: 1.021 (ref 1.005–1.030)
Urobilinogen, UA: 0.2 mg/dL (ref 0.0–1.0)
pH: 6 (ref 5.0–8.0)

## 2014-11-07 LAB — HIV ANTIBODY (ROUTINE TESTING W REFLEX): HIV: NONREACTIVE

## 2014-11-07 LAB — RPR

## 2014-11-07 LAB — VITAMIN D 25 HYDROXY (VIT D DEFICIENCY, FRACTURES): Vit D, 25-Hydroxy: 57 ng/mL (ref 30–100)

## 2014-11-07 LAB — HSV(HERPES SIMPLEX VRS) I + II AB-IGG
HSV 1 Glycoprotein G Ab, IgG: 7.63 IV — ABNORMAL HIGH
HSV 2 Glycoprotein G Ab, IgG: 8.38 IV — ABNORMAL HIGH

## 2014-11-08 LAB — URINE CULTURE
Colony Count: NO GROWTH
Organism ID, Bacteria: NO GROWTH

## 2014-11-16 ENCOUNTER — Other Ambulatory Visit: Payer: Self-pay | Admitting: Physician Assistant

## 2014-11-16 DIAGNOSIS — E349 Endocrine disorder, unspecified: Secondary | ICD-10-CM

## 2015-01-14 ENCOUNTER — Other Ambulatory Visit: Payer: Self-pay | Admitting: Physician Assistant

## 2015-02-04 ENCOUNTER — Ambulatory Visit (INDEPENDENT_AMBULATORY_CARE_PROVIDER_SITE_OTHER): Payer: 59 | Admitting: Physician Assistant

## 2015-02-04 VITALS — BP 122/68 | HR 68 | Temp 97.7°F | Resp 16 | Ht 73.5 in | Wt 189.0 lb

## 2015-02-04 DIAGNOSIS — Z79899 Other long term (current) drug therapy: Secondary | ICD-10-CM

## 2015-02-04 DIAGNOSIS — E559 Vitamin D deficiency, unspecified: Secondary | ICD-10-CM

## 2015-02-04 DIAGNOSIS — E291 Testicular hypofunction: Secondary | ICD-10-CM

## 2015-02-04 DIAGNOSIS — R7303 Prediabetes: Secondary | ICD-10-CM

## 2015-02-04 DIAGNOSIS — K2100 Gastro-esophageal reflux disease with esophagitis, without bleeding: Secondary | ICD-10-CM

## 2015-02-04 DIAGNOSIS — I10 Essential (primary) hypertension: Secondary | ICD-10-CM

## 2015-02-04 DIAGNOSIS — E782 Mixed hyperlipidemia: Secondary | ICD-10-CM

## 2015-02-04 DIAGNOSIS — R7309 Other abnormal glucose: Secondary | ICD-10-CM

## 2015-02-04 LAB — CBC WITH DIFFERENTIAL/PLATELET
BASOS ABS: 0 10*3/uL (ref 0.0–0.1)
BASOS PCT: 1 % (ref 0–1)
Eosinophils Absolute: 0.1 10*3/uL (ref 0.0–0.7)
Eosinophils Relative: 2 % (ref 0–5)
HEMATOCRIT: 47.7 % (ref 39.0–52.0)
HEMOGLOBIN: 16.5 g/dL (ref 13.0–17.0)
Lymphocytes Relative: 41 % (ref 12–46)
Lymphs Abs: 1.3 10*3/uL (ref 0.7–4.0)
MCH: 32.5 pg (ref 26.0–34.0)
MCHC: 34.6 g/dL (ref 30.0–36.0)
MCV: 93.9 fL (ref 78.0–100.0)
MONO ABS: 0.4 10*3/uL (ref 0.1–1.0)
MPV: 10.1 fL (ref 8.6–12.4)
Monocytes Relative: 12 % (ref 3–12)
NEUTROS PCT: 44 % (ref 43–77)
Neutro Abs: 1.4 10*3/uL — ABNORMAL LOW (ref 1.7–7.7)
PLATELETS: 246 10*3/uL (ref 150–400)
RBC: 5.08 MIL/uL (ref 4.22–5.81)
RDW: 13.9 % (ref 11.5–15.5)
WBC: 3.1 10*3/uL — ABNORMAL LOW (ref 4.0–10.5)

## 2015-02-04 LAB — HEMOGLOBIN A1C
Hgb A1c MFr Bld: 5.7 % — ABNORMAL HIGH (ref <5.7)
MEAN PLASMA GLUCOSE: 117 mg/dL — AB (ref <117)

## 2015-02-04 NOTE — Patient Instructions (Signed)
Call Dr. Deatra Ina colon was done in 2009, Phone: [phone]      Bad carbs also include fruit juice, alcohol, and sweet tea. These are empty calories that do not signal to your brain that you are full.   Please remember the good carbs are still carbs which convert into sugar. So please measure them out no more than 1/2-1 cup of rice, oatmeal, pasta, and beans  Veggies are however free foods! Pile them on.   Not all fruit is created equal. Please see the list below, the fruit at the bottom is higher in sugars than the fruit at the top. Please avoid all dried fruits.

## 2015-02-04 NOTE — Progress Notes (Signed)
Assessment and Plan:  1. Hypertension -Continue medication, monitor blood pressure at home. Continue DASH diet.  Reminder to go to the ER if any CP, SOB, nausea, dizziness, severe HA, changes vision/speech, left arm numbness and tingling and jaw pain.  2. Cholesterol -Continue diet and exercise. Check cholesterol.   3. Prediabetes  -Continue diet and exercise. Check A1C  4. Vitamin D Def - check level and continue medications.   5. Hypogonadism - continue replacement therapy, check testosterone levels as needed.    Continue diet and meds as discussed. Further disposition pending results of labs.Over 30 minutes of exam, counseling, chart review, and critical decision making was performed  HPI 64 y.o. male  presents for 3 month follow up on hypertension, cholesterol, prediabetes, and vitamin D deficiency.   His blood pressure has been controlled at home, today their BP is BP: 122/68 mmHg  He does not workout. He denies chest pain, shortness of breath, dizziness.  He is on cholesterol medication and denies myalgias. His cholesterol is at goal. The cholesterol last visit was:   Lab Results  Component Value Date   CHOL 146 11/06/2014   HDL 39* 11/06/2014   LDLCALC 89 11/06/2014   TRIG 90 11/06/2014   CHOLHDL 3.7 11/06/2014    He has been working on diet and exercise for prediabetes, and denies paresthesia of the feet, polydipsia, polyuria and visual disturbances. Last A1C in the office was:  Lab Results  Component Value Date   HGBA1C 5.7* 11/06/2014   Patient is on Vitamin D supplement.   Lab Results  Component Value Date   VD25OH 91 11/06/2014     Had back surgery, going to see neurosurgeon tomorrow, Dr. Joya Salm and will get xrays tomorrow.   He has a history of testosterone deficiency and is on testosterone replacement. He states that the testosterone helps with his energy, libido, muscle mass. Lab Results  Component Value Date   TESTOSTERONE 1820.69* 05/27/2014      Current Medications:  Current Outpatient Prescriptions on File Prior to Visit  Medication Sig Dispense Refill  . atorvastatin (LIPITOR) 80 MG tablet TAKE 1 TABLET (80 MG TOTAL) BY MOUTH DAILY. FOR CHOLESTEROL 30 tablet 3  . Cholecalciferol (VITAMIN D) 2000 UNITS tablet Take 4,000 Units by mouth daily.     . Cyanocobalamin (VITAMIN B-12 PO) Take 1 tablet by mouth daily.    . diclofenac (VOLTAREN) 75 MG EC tablet TAKE 1 TABLET BY MOUTH TWICE A DAY 180 tablet 0  . Flaxseed, Linseed, (FLAX SEED OIL) 1000 MG CAPS Take 1 capsule (1,000 mg total) by mouth 2 (two) times daily.  0  . fluticasone (FLONASE) 50 MCG/ACT nasal spray Place 1 spray into both nostrils daily. 16 g 2  . HYDROcodone-acetaminophen (NORCO) 10-325 MG per tablet Take 1 tablet by mouth every 6 (six) hours as needed (pain).    Marland Kitchen loratadine (CLARITIN) 10 MG tablet Take 10 mg by mouth as needed for allergies.     . Magnesium 250 MG TABS Take 250 mg by mouth daily.    Marland Kitchen omeprazole (PRILOSEC) 20 MG capsule Take 1 capsule (20 mg total) by mouth 2 (two) times daily before a meal. 180 capsule 6  . ranitidine (ZANTAC) 300 MG tablet Take twice a day while trying to get off PPI, then can go to once at night 60 tablet 3  . tamsulosin (FLOMAX) 0.4 MG CAPS capsule Take 1 capsule (0.4 mg total) by mouth daily before breakfast. 90 capsule 1  . testosterone  cypionate (DEPOTESTOTERONE CYPIONATE) 200 MG/ML injection INJECT 2 MLS INTRAMUSCULARLY EVERY 2 WEEKS 10 mL 3   No current facility-administered medications on file prior to visit.   Medical History:  Past Medical History  Diagnosis Date  . Seasonal allergies   . Hyperlipidemia   . BPH (benign prostatic hyperplasia)   . Hypogonadism male   . ED (erectile dysfunction)   . Foot drop     both feet  . Numbness     only in 4th and 5th finger on left hand  . Arthritis     in neck  . Chronic back pain     DDD  . GERD (gastroesophageal reflux disease)     takes Omeprazole daily  .  Diverticulosis   . Urinary frequency     takes Flomax daily  . Enlarged prostate   . Nocturia    Allergies:  Allergies  Allergen Reactions  . Salicylates Rash  . Aspirin     REACTION: rash  . Other     Beef-hives    Review of Systems:  Review of Systems  Constitutional: Negative.   HENT: Negative.   Eyes: Negative.   Respiratory: Negative.   Cardiovascular: Negative.   Gastrointestinal: Negative.   Genitourinary: Negative.   Musculoskeletal: Positive for back pain. Negative for myalgias, joint pain, falls and neck pain.  Skin: Negative.   Neurological: Negative.   Endo/Heme/Allergies: Negative.   Psychiatric/Behavioral: Negative.     Family history- Review and unchanged Social history- Review and unchanged Physical Exam: BP 122/68 mmHg  Pulse 68  Temp(Src) 97.7 F (36.5 C)  Resp 16  Ht 6' 1.5" (1.867 m)  Wt 189 lb (85.73 kg)  BMI 24.59 kg/m2 Wt Readings from Last 3 Encounters:  02/04/15 189 lb (85.73 kg)  11/06/14 196 lb (88.905 kg)  09/23/14 207 lb (93.895 kg)   General Appearance: Well nourished, in no apparent distress. Eyes: PERRLA, EOMs, conjunctiva no swelling or erythema Sinuses: No Frontal/maxillary tenderness ENT/Mouth: Ext aud canals clear, TMs without erythema, bulging. No erythema, swelling, or exudate on post pharynx.  Tonsils not swollen or erythematous. Hearing normal.  Neck: Supple, thyroid normal.  Respiratory: Respiratory effort normal, BS equal bilaterally without rales, rhonchi, wheezing or stridor.  Cardio: RRR with no MRGs. Brisk peripheral pulses without edema.  Abdomen: Soft, + BS,  Non tender, no guarding, rebound, hernias, masses. Lymphatics: Non tender without lymphadenopathy.  Musculoskeletal: Full ROM, 5/5 strength, Normal gait Skin: Warm, dry without rashes, lesions, ecchymosis.  Neuro: Cranial nerves intact. Normal muscle tone, no cerebellar symptoms. Psych: Awake and oriented X 3, normal affect, Insight and Judgment  appropriate.    Vicie Mutters, PA-C 11:24 AM [facility]

## 2015-02-05 LAB — BASIC METABOLIC PANEL WITH GFR
BUN: 15 mg/dL (ref 6–23)
CO2: 28 mEq/L (ref 19–32)
Calcium: 9.4 mg/dL (ref 8.4–10.5)
Chloride: 103 mEq/L (ref 96–112)
Creat: 1.03 mg/dL (ref 0.50–1.35)
GFR, EST AFRICAN AMERICAN: 88 mL/min
GFR, Est Non African American: 76 mL/min
GLUCOSE: 85 mg/dL (ref 70–99)
POTASSIUM: 4.5 meq/L (ref 3.5–5.3)
SODIUM: 138 meq/L (ref 135–145)

## 2015-02-05 LAB — HEPATIC FUNCTION PANEL
ALK PHOS: 78 U/L (ref 39–117)
ALT: 26 U/L (ref 0–53)
AST: 20 U/L (ref 0–37)
Albumin: 4.1 g/dL (ref 3.5–5.2)
Bilirubin, Direct: 0.1 mg/dL (ref 0.0–0.3)
Indirect Bilirubin: 0.4 mg/dL (ref 0.2–1.2)
TOTAL PROTEIN: 7.2 g/dL (ref 6.0–8.3)
Total Bilirubin: 0.5 mg/dL (ref 0.2–1.2)

## 2015-02-05 LAB — MAGNESIUM: MAGNESIUM: 2.1 mg/dL (ref 1.5–2.5)

## 2015-02-05 LAB — VITAMIN D 25 HYDROXY (VIT D DEFICIENCY, FRACTURES): Vit D, 25-Hydroxy: 66 ng/mL (ref 30–100)

## 2015-02-05 LAB — INSULIN, FASTING: Insulin fasting, serum: 7.4 u[IU]/mL (ref 2.0–19.6)

## 2015-02-05 LAB — LIPID PANEL
CHOLESTEROL: 153 mg/dL (ref 0–200)
HDL: 44 mg/dL (ref >=40)
LDL Cholesterol: 87 mg/dL (ref 0–99)
Total CHOL/HDL Ratio: 3.5 Ratio
Triglycerides: 112 mg/dL (ref <150)
VLDL: 22 mg/dL (ref 0–40)

## 2015-02-05 LAB — TSH: TSH: 3.15 u[IU]/mL (ref 0.350–4.500)

## 2015-02-06 ENCOUNTER — Ambulatory Visit: Payer: Self-pay | Admitting: Physician Assistant

## 2015-02-09 ENCOUNTER — Other Ambulatory Visit: Payer: Self-pay | Admitting: Internal Medicine

## 2015-02-11 ENCOUNTER — Other Ambulatory Visit: Payer: Self-pay | Admitting: Internal Medicine

## 2015-02-27 ENCOUNTER — Other Ambulatory Visit: Payer: Self-pay

## 2015-02-27 DIAGNOSIS — K2100 Gastro-esophageal reflux disease with esophagitis, without bleeding: Secondary | ICD-10-CM

## 2015-02-27 MED ORDER — RANITIDINE HCL 300 MG PO TABS: ORAL_TABLET | ORAL | Status: DC

## 2015-03-21 DIAGNOSIS — H6123 Impacted cerumen, bilateral: Secondary | ICD-10-CM | POA: Diagnosis not present

## 2015-04-10 ENCOUNTER — Encounter (HOSPITAL_COMMUNITY): Payer: Self-pay | Admitting: *Deleted

## 2015-04-10 ENCOUNTER — Emergency Department (HOSPITAL_COMMUNITY)
Admission: EM | Admit: 2015-04-10 | Discharge: 2015-04-10 | Disposition: A | Discharge status: Home / Self Care | Payer: 59 | Attending: Emergency Medicine | Admitting: Emergency Medicine

## 2015-04-10 DIAGNOSIS — K219 Gastro-esophageal reflux disease without esophagitis: Secondary | ICD-10-CM | POA: Insufficient documentation

## 2015-04-10 DIAGNOSIS — M545 Low back pain: Secondary | ICD-10-CM | POA: Diagnosis present

## 2015-04-10 DIAGNOSIS — E230 Hypopituitarism: Secondary | ICD-10-CM | POA: Diagnosis not present

## 2015-04-10 DIAGNOSIS — M5416 Radiculopathy, lumbar region: Secondary | ICD-10-CM | POA: Diagnosis not present

## 2015-04-10 DIAGNOSIS — Z791 Long term (current) use of non-steroidal anti-inflammatories (NSAID): Secondary | ICD-10-CM | POA: Diagnosis not present

## 2015-04-10 DIAGNOSIS — N400 Enlarged prostate without lower urinary tract symptoms: Secondary | ICD-10-CM | POA: Diagnosis not present

## 2015-04-10 DIAGNOSIS — Z79899 Other long term (current) drug therapy: Secondary | ICD-10-CM | POA: Diagnosis not present

## 2015-04-10 DIAGNOSIS — G8929 Other chronic pain: Secondary | ICD-10-CM | POA: Insufficient documentation

## 2015-04-10 DIAGNOSIS — Z87891 Personal history of nicotine dependence: Secondary | ICD-10-CM | POA: Diagnosis not present

## 2015-04-10 DIAGNOSIS — E785 Hyperlipidemia, unspecified: Secondary | ICD-10-CM | POA: Insufficient documentation

## 2015-04-10 DIAGNOSIS — M1990 Unspecified osteoarthritis, unspecified site: Secondary | ICD-10-CM | POA: Diagnosis not present

## 2015-04-10 DIAGNOSIS — Z7951 Long term (current) use of inhaled steroids: Secondary | ICD-10-CM | POA: Diagnosis not present

## 2015-04-10 MED ORDER — HYDROMORPHONE HCL 4 MG PO TABS: 4.0000 mg | ORAL_TABLET | ORAL | Status: DC | PRN

## 2015-04-10 MED ORDER — PREDNISONE 10 MG PO TABS: ORAL_TABLET | ORAL | Status: DC

## 2015-04-10 MED ORDER — KETOROLAC TROMETHAMINE 30 MG/ML IJ SOLN
60.0000 mg | Freq: Once | INTRAMUSCULAR | Status: AC
  Administered 2015-04-10: 60 mg via INTRAMUSCULAR
  Filled 2015-04-10: qty 2

## 2015-04-10 NOTE — ED Notes (Signed)
MD at bedside. 

## 2015-04-10 NOTE — ED Provider Notes (Signed)
CSN: [id number]     Arrival date & time 04/10/15  0915 History   First MD Initiated Contact with Patient 04/10/15 [phone]     Chief Complaint  Patient presents with  . Back Pain     (Consider location/radiation/quality/duration/timing/severity/associated sxs/prior Treatment) HPI   Jacob Larsen is a 64 y.o. male who presents for evaluation of left lower back radiating to left leg pain. This pain has been present for several months, has worsened recently. He received his second of 2 epidural injections, 10 days ago he states that he had only 24 hours of relief, then the pain returned. The pain is unresponsive to his current treatment of oxycodone, and Valium. He has increased pain with attempted walking, and feels that he will fall. He is currently using a walker to help with ambulation. He denies bowel or bladder dysfunction at this time. There's been no fever, chills, nausea, vomiting or isolated weakness. He came in by private vehicle for evaluation. There are no other known modifying factors.   Past Medical History  Diagnosis Date  . Seasonal allergies   . Hyperlipidemia   . BPH (benign prostatic hyperplasia)   . Hypogonadism male   . ED (erectile dysfunction)   . Foot drop     both feet  . Numbness     only in 4th and 5th finger on left hand  . Arthritis     in neck  . Chronic back pain     DDD  . GERD (gastroesophageal reflux disease)     takes Omeprazole daily  . Diverticulosis   . Urinary frequency     takes Flomax daily  . Enlarged prostate   . Nocturia    Past Surgical History  Procedure Laterality Date  . Cervical fusion  2011/2014  . Knee arthroscopy      right 1985; left 1992  . Septoplasty    . Shoulder arthroscopy Left 2015  . Shoulder surgery Right 08  . Colonoscopy     Family History  Problem Relation Age of Onset  . Colon cancer Neg Hx   . Stomach cancer Neg Hx   . Leukemia Mother   . Diabetes Mother   . Hypertension Father   . Diabetes Father     History  Substance Use Topics  . Smoking status: Former Research scientist (life sciences)  . Smokeless tobacco: Not on file     Comment: quit smoking >20+yrs ago  . Alcohol Use: Yes     Comment: 6pk a month.    Review of Systems  All other systems reviewed and are negative.     Allergies  Salicylates; Aspirin; and Other  Home Medications   Prior to Admission medications   Medication Sig Start Date End Date Taking? Authorizing Provider  atorvastatin (LIPITOR) 40 MG tablet Take 40 mg by mouth 3 (three) times a week. Tuesday, Thursday, Saturday   Yes Historical Provider, MD  Cholecalciferol (VITAMIN D) 2000 UNITS tablet Take 4,000 Units by mouth daily.    Yes Historical Provider, MD  Cyanocobalamin (VITAMIN B-12 PO) Take 1 tablet by mouth daily.   Yes Historical Provider, MD  diazepam (VALIUM) 10 MG tablet Take 10 mg by mouth every 6 (six) hours as needed for anxiety.   Yes Historical Provider, MD  diclofenac (VOLTAREN) 75 MG EC tablet TAKE 1 TABLET BY MOUTH TWICE A DAY 01/15/15  Yes Vicie Mutters, PA-C  Flaxseed, Linseed, (FLAX SEED OIL) 1000 MG CAPS Take 1 capsule (1,000 mg total) by mouth 2 (two)  times daily. 08/13/13  Yes Unk Pinto, MD  Magnesium 250 MG TABS Take 250 mg by mouth daily.   Yes Historical Provider, MD  ranitidine (ZANTAC) 300 MG tablet Take one tablet twice daily. 02/27/15  Yes Unk Pinto, MD  tamsulosin (FLOMAX) 0.4 MG CAPS capsule TAKE ONE CAPSULE BY MOUTH EVERY DAY 02/11/15  Yes Unk Pinto, MD  atorvastatin (LIPITOR) 80 MG tablet TAKE 1 TABLET (80 MG TOTAL) BY MOUTH DAILY. FOR CHOLESTEROL Patient not taking: Reported on 04/10/2015 10/28/14   Unk Pinto, MD  fluticasone [facility]) 50 MCG/ACT nasal spray Place 1 spray into both nostrils daily. 09/20/13 09/20/14  Vicie Mutters, PA-C  HYDROmorphone (DILAUDID) 4 MG tablet Take 1 tablet (4 mg total) by mouth every 4 (four) hours as needed for severe pain. 04/10/15   Daleen Bo, MD  loratadine (CLARITIN) 10 MG tablet Take 10 mg by  mouth as needed for allergies.     Historical Provider, MD  omeprazole (PRILOSEC) 20 MG capsule Take 1 capsule (20 mg total) by mouth 2 (two) times daily before a meal. Patient not taking: Reported on 04/10/2015 01/29/14   Kelby Aline, PA-C  predniSONE (DELTASONE) 10 MG tablet Take q day 6,5,4,3,2,1 04/10/15   Daleen Bo, MD  tamsulosin (FLOMAX) 0.4 MG CAPS capsule TAKE ONE CAPSULE BY MOUTH EVERY DAY Patient not taking: Reported on 04/10/2015 02/10/15   Unk Pinto, MD  testosterone cypionate (DEPOTESTOTERONE CYPIONATE) 200 MG/ML injection INJECT 2 MLS INTRAMUSCULARLY EVERY 2 WEEKS 11/16/14   Unk Pinto, MD   BP 115/72 mmHg  Pulse 59  Temp(Src) 98.3 F (36.8 C) (Oral)  Resp 16  Ht 6\' 2"  (1.88 m)  Wt 189 lb (85.73 kg)  BMI 24.26 kg/m2  SpO2 93% Physical Exam  Constitutional: He is oriented to person, place, and time. He appears well-developed and well-nourished.  HENT:  Head: Normocephalic and atraumatic.  Right Ear: External ear normal.  Left Ear: External ear normal.  Eyes: Conjunctivae and EOM are normal. Pupils are equal, round, and reactive to light.  Neck: Normal range of motion and phonation normal. Neck supple.  Cardiovascular: Normal rate.   Pulmonary/Chest: Effort normal. He exhibits no bony tenderness.  Musculoskeletal: Normal range of motion.  Neurological: He is alert and oriented to person, place, and time. No cranial nerve deficit or sensory deficit. He exhibits normal muscle tone. Coordination normal.  Skin: Skin is warm, dry and intact.  Psychiatric: He has a normal mood and affect. His behavior is normal. Judgment and thought content normal.  Nursing note and vitals reviewed.   ED Course  Procedures (including critical care time) Medications  ketorolac (TORADOL) 30 MG/ML injection 60 mg (60 mg Intramuscular Given 04/10/15 1004)    Patient Vitals for the past 24 hrs:  BP Temp Temp src Pulse Resp SpO2 Height Weight  04/10/15 1145 115/72 mmHg - - (!) 59 -  93 % - -  04/10/15 1139 112/78 mmHg - - 63 16 99 % - -  04/10/15 1100 121/77 mmHg - - 63 - 94 % - -  04/10/15 1030 115/77 mmHg - - 73 - 93 % - -  04/10/15 1000 136/92 mmHg - - 89 - 95 % - -  04/10/15 0930 130/80 mmHg - - 98 - 95 % - -  04/10/15 0928 128/77 mmHg 98.3 F (36.8 C) Oral 88 16 99 % 6\' 2"  (1.88 m) 189 lb (85.73 kg)     11:55- case discussed with his neurosurgeon, Dr. Joya Salm. He is comfortable keeping same  follow-up plan for office evaluation in 3 days time.  11:59 AM Reevaluation with update and discussion. After initial assessment and treatment, an updated evaluation reveals his pain is somewhat better, at this time. Findings discussed with patient and wife, all questions were answered. [place] Review Labs Reviewed - No data to display  Imaging Review No results found.   EKG Interpretation None      MDM   Final diagnoses:  Lumbar radiculopathy    Lumbar radiculopathy, with recent MRI showing HNP, L3-4, with impingement on L3 nerve, left. Symptomatic treatment is indicated. No evidence for cauda equina syndrome. He has short-term follow-up scheduled with neurosurgery.  Nursing Notes Reviewed/ Care Coordinated Applicable Imaging Reviewed Interpretation of Laboratory Data incorporated into ED treatment  The patient appears reasonably screened and/or stabilized for discharge and I doubt any other medical condition or other [facility] requiring further screening, evaluation, or treatment in the ED at this time prior to discharge.  Plan: Home Medications- Change to Dilaudid tabs, Presnisone taper; Home Treatments- rest; return here if the recommended treatment, does not improve the symptoms; Recommended follow up- Dr. Joya Salm as scheduled in 3 days     Daleen Bo, MD 04/10/15 [phone]

## 2015-04-10 NOTE — ED Notes (Addendum)
Pt presents via POV with c/o lower left sided back pain radiating down his left leg.  Pt reports back surgery in January and also received a shot 2 weeks ago but did not help.  Pt taking oxycodone and valium for pain without relief. Pt reports having an appt with Dr Nicholes Calamity next week but could not stand the pain.  Pt reports having to use his walker this week and also reports falling x 2.  Pt a x 4, NAD.

## 2015-04-10 NOTE — Discharge Instructions (Signed)
Back Pain, Adult °Back pain is very common. The pain often gets better over time. The cause of back pain is usually not dangerous. Most people can learn to manage their back pain on their own.  °HOME CARE  °· Stay active. Start with short walks on flat ground if you can. Try to walk farther each day. °· Do not sit, drive, or stand in one place for more than 30 minutes. Do not stay in bed. °· Do not avoid exercise or work. Activity can help your back heal faster. °· Be careful when you bend or lift an object. Bend at your knees, keep the object close to you, and do not twist. °· Sleep on a firm mattress. Lie on your side, and bend your knees. If you lie on your back, put a pillow under your knees. °· Only take medicines as told by your doctor. °· Put ice on the injured area. °¨ Put ice in a plastic bag. °¨ Place a towel between your skin and the bag. °¨ Leave the ice on for 15-20 minutes, 03-04 times a day for the first 2 to 3 days. After that, you can switch between ice and heat packs. °· Ask your doctor about back exercises or massage. °· Avoid feeling anxious or stressed. Find good ways to deal with stress, such as exercise. °GET HELP RIGHT AWAY IF:  °· Your pain does not go away with rest or medicine. °· Your pain does not go away in 1 week. °· You have new problems. °· You do not feel well. °· The pain spreads into your legs. °· You cannot control when you poop (bowel movement) or pee (urinate). °· Your arms or legs feel weak or lose feeling (numbness). °· You feel sick to your stomach (nauseous) or throw up (vomit). °· You have belly (abdominal) pain. °· You feel like you may pass out (faint). °MAKE SURE YOU:  °· Understand these instructions. °· Will watch your condition. °· Will get help right away if you are not doing well or get worse. °Document Released: 02/15/2008 Document Revised: 11/21/2011 Document Reviewed: 12/31/2013 °Exit[employer]® Patient Information ©2015 [employer]. This information is not intended  to replace advice given to you by your health care provider. Make sure you discuss any questions you have with your health care provider. ° °

## 2015-04-14 ENCOUNTER — Other Ambulatory Visit: Payer: Self-pay | Admitting: Neurosurgery

## 2015-04-16 ENCOUNTER — Encounter (HOSPITAL_COMMUNITY)
Admission: RE | Admit: 2015-04-16 | Discharge: 2015-04-16 | Disposition: A | Discharge status: Home / Self Care | Payer: 59 | Source: Ambulatory Visit | Attending: Neurosurgery | Admitting: Neurosurgery

## 2015-04-16 ENCOUNTER — Encounter (HOSPITAL_COMMUNITY): Payer: Self-pay

## 2015-04-16 DIAGNOSIS — Z01812 Encounter for preprocedural laboratory examination: Secondary | ICD-10-CM | POA: Insufficient documentation

## 2015-04-16 DIAGNOSIS — Z0183 Encounter for blood typing: Secondary | ICD-10-CM | POA: Insufficient documentation

## 2015-04-16 DIAGNOSIS — M5136 Other intervertebral disc degeneration, lumbar region: Secondary | ICD-10-CM | POA: Insufficient documentation

## 2015-04-16 HISTORY — DX: Anxiety disorder, unspecified: F41.9

## 2015-04-16 LAB — SURGICAL PCR SCREEN
MRSA, PCR: NEGATIVE
Staphylococcus aureus: NEGATIVE

## 2015-04-16 LAB — BASIC METABOLIC PANEL
Anion gap: 9 (ref 5–15)
BUN: 18 mg/dL (ref 6–20)
CALCIUM: 9.4 mg/dL (ref 8.9–10.3)
CO2: 25 mmol/L (ref 22–32)
CREATININE: 1.23 mg/dL (ref 0.61–1.24)
Chloride: 103 mmol/L (ref 101–111)
GFR calc Af Amer: >60 mL/min (ref >60)
GFR calc non Af Amer: >60 mL/min (ref >60)
Glucose, Bld: 135 mg/dL — ABNORMAL HIGH (ref 65–99)
Potassium: 3.9 mmol/L (ref 3.5–5.1)
Sodium: 137 mmol/L (ref 135–145)

## 2015-04-16 LAB — CBC
HEMATOCRIT: 46.4 % (ref 39.0–52.0)
Hemoglobin: 15.9 g/dL (ref 13.0–17.0)
MCH: 32.9 pg (ref 26.0–34.0)
MCHC: 34.3 g/dL (ref 30.0–36.0)
MCV: 95.9 fL (ref 78.0–100.0)
Platelets: 254 10*3/uL (ref 150–400)
RBC: 4.84 MIL/uL (ref 4.22–5.81)
RDW: 12.9 % (ref 11.5–15.5)
WBC: 7.1 10*3/uL (ref 4.0–10.5)

## 2015-04-16 NOTE — Pre-Procedure Instructions (Signed)
[name]  04/16/2015      [id number] [place], [place] - [place] [address] [place] [address] Phone: [phone] Fax: [phone]  [facility] [place], [place] [address] [place] [address] Phone: [phone] Fax: [phone]    Your procedure is scheduled on Tues, Aug 9 @ 11:00 AM  Report to [facility] Admitting at 8:00 AM.  Call this number if you have problems the morning of surgery:  [phone]   Remember:  Do not eat food or drink liquids after midnight.  Take these medicines the Morning of Surgery with A SIP OF WATER Valium(Diazepam),Flonase(Fluticasone),Pain Pill(if needed),Claritin(Loratadine-if needed),Tamsulosin(Flomax),and Ranitidine(Zantac)              Stop taking your Diclofen,Vitamins,and any Herbal Medications. No Goody's,BC's,Aleve,Aspirin,Ibuprofen,or Fish Oil.   Do not wear jewelry.  Do not wear lotions, powders, or colognes.  You may wear deodorant.  Men may shave face and neck.  Do not bring valuables to the hospital.  [facility] is not responsible for any belongings or valuables.  Contacts, dentures or bridgework may not be worn into surgery.  Leave your suitcase in the car.  After surgery it may be brought to your room.  For patients admitted to the hospital, discharge time will be determined by your treatment team.  Patients discharged the day of surgery will not be allowed to drive home.    Special instructions:  [facility] - Preparing for Surgery  Before surgery, you can play an important role.  Because skin is not sterile, your skin needs to be as free of germs as possible.  You can reduce the number of germs on you skin by washing with CHG (chlorahexidine gluconate) soap before surgery.  CHG is an antiseptic cleaner which kills germs and bonds with the skin to continue killing germs even  after washing.  Please DO NOT use if you have an allergy to CHG or antibacterial soaps.  If your skin becomes reddened/irritated stop using the CHG and inform your nurse when you arrive at Short Stay.  Do not shave (including legs and underarms) for at least 48 hours prior to the first CHG shower.  You may shave your face.  Please follow these instructions carefully:   1.  Shower with CHG Soap the night before surgery and the                                morning of Surgery.  2.  If you choose to wash your hair, wash your hair first as usual with your       normal shampoo.  3.  After you shampoo, rinse your hair and body thoroughly to remove the                      Shampoo.  4.  Use CHG as you would any other liquid soap.  You can apply chg directly       to the skin and wash gently with scrungie or a clean washcloth.  5.  Apply the CHG Soap to your body ONLY FROM THE NECK DOWN.        Do not use on open wounds or open sores.  Avoid contact with your eyes,       ears, mouth and  genitals (private parts).  Wash genitals (private parts)       with your normal soap.  6.  Wash thoroughly, paying special attention to the area where your surgery        will be performed.  7.  Thoroughly rinse your body with warm water from the neck down.  8.  DO NOT shower/wash with your normal soap after using and rinsing off       the CHG Soap.  9.  Pat yourself dry with a clean towel.            10.  Wear clean pajamas.            11.  Place clean sheets on your bed the night of your first shower and do not        sleep with pets.  Day of Surgery  Do not apply any lotions/deoderants the morning of surgery.  Please wear clean clothes to the hospital/surgery center.    Please read over the following fact sheets that you were given. Pain Booklet, Coughing and Deep Breathing, Blood Transfusion Information, MRSA Information and Surgical Site Infection Prevention

## 2015-04-16 NOTE — Progress Notes (Addendum)
Cardiologist doesn't have one  Medical Md is Dr.William Melford Aase  Echo report in epic from 2010  EKG in epic from 05-27-14  CXR in epic from 09-26-14  Stress test denies ever having one  Heart cath ever having one

## 2015-04-20 NOTE — H&P (Signed)
DAEMION MCNIEL is an 64 y.o. male.   Chief Complaint: left leg pain HPI: patient seen by me and the emergency room because lumbar pain with radiationto the left leg, no better with conservative treatmen. In the past he da a l4-5 lumbar fusion and the latest mri shows ddd at l3-4 with a herniated disc to the left  Past Medical History  Diagnosis Date  . Seasonal allergies     takes Claritin daily as needed and uses Flonase daily as needed  . Hyperlipidemia     takes Atorvastatin daily  . BPH (benign prostatic hyperplasia)     takes Flomax daily  . Hypogonadism male   . Foot drop     both feet  . Numbness     only in 4th and 5th finger on left hand and left leg  . Chronic back pain     DDD  . Diverticulosis   . Urinary frequency     takes Flomax daily  . Nocturia   . Anxiety     takes Valium daily as needed  . GERD (gastroesophageal reflux disease)     takes Omeprazole daily as well as Zantac  . Arthritis     in neck    Past Surgical History  Procedure Laterality Date  . Cervical fusion  2011/2014  . Knee arthroscopy      right 1985; left 1992  . Septoplasty    . Shoulder arthroscopy Left 2015  . Shoulder surgery Right 08  . Colonoscopy    . Back surgery      lumbar fusion    Family History  Problem Relation Age of Onset  . Colon cancer Neg Hx   . Stomach cancer Neg Hx   . Leukemia Mother   . Diabetes Mother   . Hypertension Father   . Diabetes Father    Social History:  reports that he has quit smoking. He does not have any smokeless tobacco history on file. He reports that he drinks alcohol. He reports that he does not use illicit drugs.  Allergies:  Allergies  Allergen Reactions  . Salicylates Rash  . Aspirin     REACTION: rash  . Other     Beef-hives    No prescriptions prior to admission    No results found for this or any previous visit (from the past 48 hour(s)). No results found.  Review of Systems  Constitutional: Negative.   Eyes:  Negative.   Respiratory: Negative.   Cardiovascular: Negative.   Gastrointestinal: Negative.   Genitourinary: Negative.   Musculoskeletal: Positive for back pain and neck pain.  Skin: Negative.   Neurological: Positive for sensory change and focal weakness.  Endo/Heme/Allergies: Negative.   Psychiatric/Behavioral: Negative.     There were no vitals taken for this visit. Physical Exam  Hent, nl. Neck, anterior scar. Some tenderness with mobility. Cv, nl. Lungs,clear. Abdomen, soft. Extremities, nl. NEURO weakness of left quadriceps, femoral stretch maneuver pisitive bilaterally Assessment/Plan Patient to go ahaed with augmentation of the fusion to l3-4. He iand his wife are aware of risks and benefits  Taneshia Lorence M 04/20/2015, 4:26 PM

## 2015-04-27 MED ORDER — CEFAZOLIN SODIUM-DEXTROSE 2-3 GM-% IV SOLR
2.0000 g | INTRAVENOUS | Status: AC
  Administered 2015-04-28 (×2): 2 g via INTRAVENOUS
  Filled 2015-04-27: qty 50

## 2015-04-27 NOTE — H&P (Signed)
Jacob Larsen is an 64 y.o. male.   Chief Complaint: left leg pain HPI: patient seen in my office as well as in the emergency department because of lumbar pain with radiation to the left leg, no better with conservative treatment. In the past he has had  Lumbar fusion  Past Medical History  Diagnosis Date  . Seasonal allergies     takes Claritin daily as needed and uses Flonase daily as needed  . Hyperlipidemia     takes Atorvastatin daily  . BPH (benign prostatic hyperplasia)     takes Flomax daily  . Hypogonadism male   . Foot drop     both feet  . Numbness     only in 4th and 5th finger on left hand and left leg  . Chronic back pain     DDD  . Diverticulosis   . Urinary frequency     takes Flomax daily  . Nocturia   . Anxiety     takes Valium daily as needed  . GERD (gastroesophageal reflux disease)     takes Omeprazole daily as well as Zantac  . Arthritis     in neck    Past Surgical History  Procedure Laterality Date  . Cervical fusion  2011/2014  . Knee arthroscopy      right 1985; left 1992  . Septoplasty    . Shoulder arthroscopy Left 2015  . Shoulder surgery Right 08  . Colonoscopy    . Back surgery      lumbar fusion    Family History  Problem Relation Age of Onset  . Colon cancer Neg Hx   . Stomach cancer Neg Hx   . Leukemia Mother   . Diabetes Mother   . Hypertension Father   . Diabetes Father    Social History:  reports that he has quit smoking. He does not have any smokeless tobacco history on file. He reports that he drinks alcohol. He reports that he does not use illicit drugs.  Allergies:  Allergies  Allergen Reactions  . Salicylates Rash  . Aspirin     REACTION: rash  . Other     Beef-hives    No prescriptions prior to admission    No results found for this or any previous visit (from the past 48 hour(s)). No results found.  Review of Systems  Constitutional: Negative.   Eyes: Negative.   Respiratory: Negative.    Cardiovascular: Negative.   Gastrointestinal: Negative.   Genitourinary: Negative.   Musculoskeletal: Positive for back pain and neck pain.  Skin: Negative.   Neurological: Positive for sensory change and focal weakness.  Endo/Heme/Allergies: Negative.   Psychiatric/Behavioral: Negative.     There were no vitals taken for this visit. Physical Exam hent,nl. Neck, scar anterior. Cv, nl .lungs, clear. Abdomen, soft. Extremities nl.  NEURO weakness of the left quadriceps. Femoral stretch is positive bilaterally. Mri lumbar shows left extraforaminal hnp with ddd at l45.  Assessment/Plan Patient agrees with surgery which will be augmentation from l4 5 to l3-4. He and his wife are aware of risks and benefis. He was schedule for last week bu we did not get the ok from the [place] M 04/27/2015, 4:58 PM

## 2015-04-28 ENCOUNTER — Inpatient Hospital Stay (HOSPITAL_COMMUNITY)
Admission: RE | Admit: 2015-04-28 | Discharge: 2015-05-02 | DRG: 460 | Disposition: A | Discharge status: Home / Self Care | Payer: 59 | Source: Ambulatory Visit | Attending: Neurosurgery | Admitting: Neurosurgery

## 2015-04-28 ENCOUNTER — Encounter (HOSPITAL_COMMUNITY): Payer: Self-pay | Admitting: General Practice

## 2015-04-28 ENCOUNTER — Inpatient Hospital Stay (HOSPITAL_COMMUNITY): Payer: 59 | Admitting: Anesthesiology

## 2015-04-28 ENCOUNTER — Encounter (HOSPITAL_COMMUNITY)
Admission: RE | Disposition: A | Discharge status: Home / Self Care | Payer: 59 | Source: Ambulatory Visit | Attending: Neurosurgery

## 2015-04-28 ENCOUNTER — Inpatient Hospital Stay (HOSPITAL_COMMUNITY): Payer: 59

## 2015-04-28 DIAGNOSIS — M4316 Spondylolisthesis, lumbar region: Secondary | ICD-10-CM | POA: Diagnosis present

## 2015-04-28 DIAGNOSIS — F419 Anxiety disorder, unspecified: Secondary | ICD-10-CM | POA: Diagnosis present

## 2015-04-28 DIAGNOSIS — M1990 Unspecified osteoarthritis, unspecified site: Secondary | ICD-10-CM | POA: Diagnosis present

## 2015-04-28 DIAGNOSIS — J302 Other seasonal allergic rhinitis: Secondary | ICD-10-CM | POA: Diagnosis present

## 2015-04-28 DIAGNOSIS — Z87891 Personal history of nicotine dependence: Secondary | ICD-10-CM

## 2015-04-28 DIAGNOSIS — M21372 Foot drop, left foot: Secondary | ICD-10-CM | POA: Diagnosis present

## 2015-04-28 DIAGNOSIS — K219 Gastro-esophageal reflux disease without esophagitis: Secondary | ICD-10-CM | POA: Diagnosis present

## 2015-04-28 DIAGNOSIS — Z91018 Allergy to other foods: Secondary | ICD-10-CM

## 2015-04-28 DIAGNOSIS — M79605 Pain in left leg: Secondary | ICD-10-CM | POA: Diagnosis present

## 2015-04-28 DIAGNOSIS — I10 Essential (primary) hypertension: Secondary | ICD-10-CM | POA: Diagnosis present

## 2015-04-28 DIAGNOSIS — Z886 Allergy status to analgesic agent status: Secondary | ICD-10-CM

## 2015-04-28 DIAGNOSIS — M4326 Fusion of spine, lumbar region: Secondary | ICD-10-CM

## 2015-04-28 DIAGNOSIS — E785 Hyperlipidemia, unspecified: Secondary | ICD-10-CM | POA: Diagnosis present

## 2015-04-28 DIAGNOSIS — N400 Enlarged prostate without lower urinary tract symptoms: Secondary | ICD-10-CM | POA: Diagnosis present

## 2015-04-28 DIAGNOSIS — Z981 Arthrodesis status: Secondary | ICD-10-CM | POA: Diagnosis not present

## 2015-04-28 DIAGNOSIS — Z79899 Other long term (current) drug therapy: Secondary | ICD-10-CM | POA: Diagnosis not present

## 2015-04-28 LAB — TYPE AND SCREEN
ABO/RH(D): O POS
Antibody Screen: NEGATIVE

## 2015-04-28 SURGERY — POSTERIOR LUMBAR FUSION 1 LEVEL
Anesthesia: General | Site: Back

## 2015-04-28 MED ORDER — MENTHOL 3 MG MT LOZG: 1.0000 | LOZENGE | OROMUCOSAL | Status: DC | PRN

## 2015-04-28 MED ORDER — ACETAMINOPHEN 650 MG RE SUPP: 650.0000 mg | RECTAL | Status: DC | PRN

## 2015-04-28 MED ORDER — LIDOCAINE HCL (CARDIAC) 20 MG/ML IV SOLN
INTRAVENOUS | Status: DC | PRN
  Administered 2015-04-28: 80 mg via INTRAVENOUS

## 2015-04-28 MED ORDER — LACTATED RINGERS IV SOLN: INTRAVENOUS | Status: DC

## 2015-04-28 MED ORDER — PROPOFOL 10 MG/ML IV BOLUS
INTRAVENOUS | Status: AC
  Filled 2015-04-28: qty 20

## 2015-04-28 MED ORDER — SODIUM CHLORIDE 0.9 % IV SOLN
INTRAVENOUS | Status: DC | PRN
  Administered 2015-04-28: 13:00:00 via INTRAVENOUS

## 2015-04-28 MED ORDER — VANCOMYCIN HCL 1000 MG IV SOLR
INTRAVENOUS | Status: DC | PRN
  Administered 2015-04-28: 1000 mg

## 2015-04-28 MED ORDER — HYDROMORPHONE HCL 1 MG/ML IJ SOLN
INTRAMUSCULAR | Status: AC
  Filled 2015-04-28: qty 1

## 2015-04-28 MED ORDER — MEPERIDINE HCL 25 MG/ML IJ SOLN: 6.2500 mg | INTRAMUSCULAR | Status: DC | PRN

## 2015-04-28 MED ORDER — PHENYLEPHRINE 40 MCG/ML (10ML) SYRINGE FOR IV PUSH (FOR BLOOD PRESSURE SUPPORT)
PREFILLED_SYRINGE | INTRAVENOUS | Status: AC
  Filled 2015-04-28: qty 10

## 2015-04-28 MED ORDER — PHENYLEPHRINE HCL 10 MG/ML IJ SOLN
INTRAMUSCULAR | Status: DC | PRN
  Administered 2015-04-28: 40 ug via INTRAVENOUS
  Administered 2015-04-28: 80 ug via INTRAVENOUS

## 2015-04-28 MED ORDER — GLYCOPYRROLATE 0.2 MG/ML IJ SOLN
INTRAMUSCULAR | Status: AC
  Filled 2015-04-28: qty 3

## 2015-04-28 MED ORDER — LACTATED RINGERS IV SOLN
INTRAVENOUS | Status: DC
  Administered 2015-04-28 (×3): via INTRAVENOUS

## 2015-04-28 MED ORDER — SODIUM CHLORIDE 0.9 % IJ SOLN: 9.0000 mL | INTRAMUSCULAR | Status: DC | PRN

## 2015-04-28 MED ORDER — DIAZEPAM 5 MG PO TABS
10.0000 mg | ORAL_TABLET | Freq: Four times a day (QID) | ORAL | Status: DC | PRN
  Administered 2015-04-29 – 2015-05-01 (×3): 10 mg via ORAL
  Filled 2015-04-28 (×3): qty 2

## 2015-04-28 MED ORDER — ATORVASTATIN CALCIUM 40 MG PO TABS
40.0000 mg | ORAL_TABLET | ORAL | Status: DC
  Administered 2015-04-28 – 2015-04-30 (×2): 40 mg via ORAL
  Filled 2015-04-28 (×2): qty 1

## 2015-04-28 MED ORDER — SODIUM CHLORIDE 0.9 % IV SOLN: 250.0000 mL | INTRAVENOUS | Status: DC

## 2015-04-28 MED ORDER — NEOSTIGMINE METHYLSULFATE 10 MG/10ML IV SOLN
INTRAVENOUS | Status: DC | PRN
  Administered 2015-04-28: 3 mg via INTRAVENOUS

## 2015-04-28 MED ORDER — ALUM & MAG HYDROXIDE-SIMETH 200-200-20 MG/5ML PO SUSP
30.0000 mL | Freq: Four times a day (QID) | ORAL | Status: DC | PRN
  Administered 2015-04-29 – 2015-05-01 (×2): 30 mL via ORAL
  Filled 2015-04-28 (×2): qty 30

## 2015-04-28 MED ORDER — ONDANSETRON HCL 4 MG/2ML IJ SOLN
INTRAMUSCULAR | Status: AC
  Filled 2015-04-28: qty 2

## 2015-04-28 MED ORDER — PHENOL 1.4 % MT LIQD: 1.0000 | OROMUCOSAL | Status: DC | PRN

## 2015-04-28 MED ORDER — ALBUMIN HUMAN 5 % IV SOLN
INTRAVENOUS | Status: DC | PRN
  Administered 2015-04-28: 11:00:00 via INTRAVENOUS

## 2015-04-28 MED ORDER — FENTANYL CITRATE (PF) 250 MCG/5ML IJ SOLN
INTRAMUSCULAR | Status: AC
  Filled 2015-04-28: qty 5

## 2015-04-28 MED ORDER — DEXAMETHASONE SODIUM PHOSPHATE 10 MG/ML IJ SOLN
INTRAMUSCULAR | Status: DC | PRN
  Administered 2015-04-28: 10 mg via INTRAVENOUS

## 2015-04-28 MED ORDER — TAMSULOSIN HCL 0.4 MG PO CAPS
0.4000 mg | ORAL_CAPSULE | Freq: Every day | ORAL | Status: DC
  Administered 2015-04-29 – 2015-05-02 (×4): 0.4 mg via ORAL
  Filled 2015-04-28 (×5): qty 1

## 2015-04-28 MED ORDER — MIDAZOLAM HCL 5 MG/5ML IJ SOLN
INTRAMUSCULAR | Status: DC | PRN
  Administered 2015-04-28: 2 mg via INTRAVENOUS

## 2015-04-28 MED ORDER — FENTANYL CITRATE (PF) 100 MCG/2ML IJ SOLN
INTRAMUSCULAR | Status: DC | PRN
  Administered 2015-04-28 (×2): 50 ug via INTRAVENOUS
  Administered 2015-04-28: 100 ug via INTRAVENOUS
  Administered 2015-04-28 (×2): 50 ug via INTRAVENOUS

## 2015-04-28 MED ORDER — ACETAMINOPHEN 325 MG PO TABS
650.0000 mg | ORAL_TABLET | ORAL | Status: DC | PRN
  Administered 2015-04-29: 650 mg via ORAL
  Filled 2015-04-28: qty 2

## 2015-04-28 MED ORDER — ROCURONIUM BROMIDE 50 MG/5ML IV SOLN
INTRAVENOUS | Status: AC
  Filled 2015-04-28: qty 1

## 2015-04-28 MED ORDER — ACETAMINOPHEN 10 MG/ML IV SOLN
INTRAVENOUS | Status: AC
  Administered 2015-04-28: 1000 mg via INTRAVENOUS
  Filled 2015-04-28: qty 100

## 2015-04-28 MED ORDER — GLYCOPYRROLATE 0.2 MG/ML IJ SOLN
INTRAMUSCULAR | Status: AC
  Filled 2015-04-28: qty 1

## 2015-04-28 MED ORDER — THROMBIN 20000 UNITS EX SOLR
CUTANEOUS | Status: DC | PRN
  Administered 2015-04-28: 20 mL via TOPICAL

## 2015-04-28 MED ORDER — HYDROMORPHONE HCL 1 MG/ML IJ SOLN
0.2500 mg | INTRAMUSCULAR | Status: DC | PRN
  Administered 2015-04-28 (×3): 0.5 mg via INTRAVENOUS

## 2015-04-28 MED ORDER — CEFAZOLIN SODIUM 1-5 GM-% IV SOLN
1.0000 g | Freq: Three times a day (TID) | INTRAVENOUS | Status: AC
  Administered 2015-04-28 – 2015-04-29 (×2): 1 g via INTRAVENOUS
  Filled 2015-04-28 (×2): qty 50

## 2015-04-28 MED ORDER — MORPHINE SULFATE 1 MG/ML IV SOLN
INTRAVENOUS | Status: DC
  Administered 2015-04-28 – 2015-04-30 (×3): via INTRAVENOUS
  Filled 2015-04-28 (×2): qty 25

## 2015-04-28 MED ORDER — DIPHENHYDRAMINE HCL 50 MG/ML IJ SOLN: 12.5000 mg | Freq: Four times a day (QID) | INTRAMUSCULAR | Status: DC | PRN

## 2015-04-28 MED ORDER — SODIUM CHLORIDE 0.9 % IJ SOLN
INTRAMUSCULAR | Status: AC
  Filled 2015-04-28: qty 10

## 2015-04-28 MED ORDER — VITAMIN D 1000 UNITS PO TABS
4000.0000 [IU] | ORAL_TABLET | Freq: Every day | ORAL | Status: DC
  Administered 2015-04-28 – 2015-05-02 (×5): 4000 [IU] via ORAL
  Filled 2015-04-28 (×3): qty 4
  Filled 2015-04-28 (×2): qty 2
  Filled 2015-04-28: qty 4

## 2015-04-28 MED ORDER — SODIUM CHLORIDE 0.9 % IV SOLN
INTRAVENOUS | Status: DC
  Administered 2015-04-28 (×2): via INTRAVENOUS

## 2015-04-28 MED ORDER — ONDANSETRON HCL 4 MG/2ML IJ SOLN
INTRAMUSCULAR | Status: DC | PRN
  Administered 2015-04-28: 4 mg via INTRAVENOUS

## 2015-04-28 MED ORDER — SODIUM CHLORIDE 0.9 % IJ SOLN: 3.0000 mL | INTRAMUSCULAR | Status: DC | PRN

## 2015-04-28 MED ORDER — VANCOMYCIN HCL 1000 MG IV SOLR
INTRAVENOUS | Status: AC
  Filled 2015-04-28: qty 1000

## 2015-04-28 MED ORDER — SODIUM CHLORIDE 0.9 % IJ SOLN
3.0000 mL | Freq: Two times a day (BID) | INTRAMUSCULAR | Status: DC
  Administered 2015-04-28 – 2015-04-29 (×2): 3 mL via INTRAVENOUS

## 2015-04-28 MED ORDER — PROMETHAZINE HCL 25 MG/ML IJ SOLN: 6.2500 mg | INTRAMUSCULAR | Status: DC | PRN

## 2015-04-28 MED ORDER — MAGNESIUM OXIDE 400 (241.3 MG) MG PO TABS
200.0000 mg | ORAL_TABLET | Freq: Every day | ORAL | Status: DC
  Administered 2015-04-28 – 2015-05-02 (×5): 200 mg via ORAL
  Filled 2015-04-28 (×5): qty 1

## 2015-04-28 MED ORDER — OXYCODONE-ACETAMINOPHEN 5-325 MG PO TABS: 2.0000 | ORAL_TABLET | ORAL | Status: DC | PRN

## 2015-04-28 MED ORDER — GLYCOPYRROLATE 0.2 MG/ML IJ SOLN
INTRAMUSCULAR | Status: DC | PRN
  Administered 2015-04-28: 0.4 mg via INTRAVENOUS

## 2015-04-28 MED ORDER — MORPHINE SULFATE 1 MG/ML IV SOLN
INTRAVENOUS | Status: AC
  Filled 2015-04-28: qty 25

## 2015-04-28 MED ORDER — SUCCINYLCHOLINE CHLORIDE 20 MG/ML IJ SOLN
INTRAMUSCULAR | Status: AC
  Filled 2015-04-28: qty 1

## 2015-04-28 MED ORDER — DEXAMETHASONE SODIUM PHOSPHATE 10 MG/ML IJ SOLN
INTRAMUSCULAR | Status: AC
  Filled 2015-04-28: qty 1

## 2015-04-28 MED ORDER — LIDOCAINE HCL (CARDIAC) 20 MG/ML IV SOLN
INTRAVENOUS | Status: AC
  Filled 2015-04-28: qty 5

## 2015-04-28 MED ORDER — ONDANSETRON HCL 4 MG/2ML IJ SOLN: 4.0000 mg | Freq: Four times a day (QID) | INTRAMUSCULAR | Status: DC | PRN

## 2015-04-28 MED ORDER — SENNA 8.6 MG PO TABS
1.0000 | ORAL_TABLET | Freq: Two times a day (BID) | ORAL | Status: DC
  Administered 2015-04-29 – 2015-05-02 (×8): 8.6 mg via ORAL
  Filled 2015-04-28 (×8): qty 1

## 2015-04-28 MED ORDER — EPHEDRINE SULFATE 50 MG/ML IJ SOLN
INTRAMUSCULAR | Status: AC
  Filled 2015-04-28: qty 1

## 2015-04-28 MED ORDER — NALOXONE HCL 0.4 MG/ML IJ SOLN
0.4000 mg | INTRAMUSCULAR | Status: DC | PRN
  Filled 2015-04-28: qty 1

## 2015-04-28 MED ORDER — VITAMIN B-12 100 MCG PO TABS
100.0000 ug | ORAL_TABLET | Freq: Every day | ORAL | Status: DC
  Administered 2015-04-28 – 2015-05-02 (×5): 100 ug via ORAL
  Filled 2015-04-28 (×5): qty 1

## 2015-04-28 MED ORDER — BUPIVACAINE LIPOSOME 1.3 % IJ SUSP
20.0000 mL | INTRAMUSCULAR | Status: DC
  Filled 2015-04-28: qty 20

## 2015-04-28 MED ORDER — NEOSTIGMINE METHYLSULFATE 10 MG/10ML IV SOLN
INTRAVENOUS | Status: AC
  Filled 2015-04-28: qty 1

## 2015-04-28 MED ORDER — ONDANSETRON HCL 4 MG/2ML IJ SOLN
4.0000 mg | INTRAMUSCULAR | Status: DC | PRN
  Administered 2015-04-28 – 2015-04-29 (×2): 4 mg via INTRAVENOUS
  Filled 2015-04-28 (×2): qty 2

## 2015-04-28 MED ORDER — ROCURONIUM BROMIDE 100 MG/10ML IV SOLN
INTRAVENOUS | Status: DC | PRN
  Administered 2015-04-28: 40 mg via INTRAVENOUS

## 2015-04-28 MED ORDER — PANTOPRAZOLE SODIUM 40 MG PO TBEC
80.0000 mg | DELAYED_RELEASE_TABLET | Freq: Every day | ORAL | Status: DC
Start: 2015-04-28 — End: 2015-04-28

## 2015-04-28 MED ORDER — PANTOPRAZOLE SODIUM 40 MG PO TBEC
80.0000 mg | DELAYED_RELEASE_TABLET | Freq: Every day | ORAL | Status: DC
  Administered 2015-04-28 – 2015-05-02 (×5): 80 mg via ORAL
  Filled 2015-04-28 (×5): qty 2

## 2015-04-28 MED ORDER — DIPHENHYDRAMINE HCL 12.5 MG/5ML PO ELIX: 12.5000 mg | ORAL_SOLUTION | Freq: Four times a day (QID) | ORAL | Status: DC | PRN

## 2015-04-28 MED ORDER — MIDAZOLAM HCL 2 MG/2ML IJ SOLN
INTRAMUSCULAR | Status: AC
  Filled 2015-04-28: qty 4

## 2015-04-28 MED ORDER — MAGNESIUM 250 MG PO TABS
250.0000 mg | ORAL_TABLET | Freq: Every day | ORAL | Status: DC
  Filled 2015-04-28: qty 1

## 2015-04-28 MED ORDER — EPHEDRINE SULFATE 50 MG/ML IJ SOLN
INTRAMUSCULAR | Status: DC | PRN
  Administered 2015-04-28: 10 mg via INTRAVENOUS

## 2015-04-28 MED ORDER — 0.9 % SODIUM CHLORIDE (POUR BTL) OPTIME
TOPICAL | Status: DC | PRN
  Administered 2015-04-28: 1000 mL

## 2015-04-28 MED ORDER — PROPOFOL 10 MG/ML IV BOLUS
INTRAVENOUS | Status: DC | PRN
  Administered 2015-04-28: 160 mg via INTRAVENOUS

## 2015-04-28 MED ORDER — CEFAZOLIN SODIUM-DEXTROSE 2-3 GM-% IV SOLR
INTRAVENOUS | Status: AC
  Filled 2015-04-28: qty 50

## 2015-04-28 MED ORDER — LORATADINE 10 MG PO TABS: 10.0000 mg | ORAL_TABLET | ORAL | Status: DC | PRN

## 2015-04-28 MED ORDER — BUPIVACAINE LIPOSOME 1.3 % IJ SUSP
INTRAMUSCULAR | Status: DC | PRN
  Administered 2015-04-28: 20 mL

## 2015-04-28 MED FILL — Sodium Chloride Irrigation Soln 0.9%: Qty: 1000 | Status: AC

## 2015-04-28 MED FILL — Heparin Sodium (Porcine) Inj 1000 Unit/ML: INTRAMUSCULAR | Qty: 30 | Status: AC

## 2015-04-28 MED FILL — Sodium Chloride IV Soln 0.9%: INTRAVENOUS | Qty: 1000 | Status: AC

## 2015-04-28 SURGICAL SUPPLY — 63 items
APL SKNCLS STERI-STRIP NONHPOA (GAUZE/BANDAGES/DRESSINGS) ×1
BENZOIN TINCTURE PRP APPL 2/3 (GAUZE/BANDAGES/DRESSINGS) ×2 IMPLANT
BLADE CLIPPER SURG (BLADE) IMPLANT
BUR ACORN 6.0 (BURR) ×2 IMPLANT
BUR MATCHSTICK NEURO 3.0 LAGG (BURR) ×2 IMPLANT
CANISTER SUCT 3000ML PPV (MISCELLANEOUS) ×2 IMPLANT
CAP LOCKING THREADED (Cap) ×2 IMPLANT
CLAMP REVERE ADDITION 5.5-5.5 (Clamp) ×1 IMPLANT
CONT SPEC 4OZ CLIKSEAL STRL BL (MISCELLANEOUS) ×2 IMPLANT
COVER BACK TABLE 60X90IN (DRAPES) ×2 IMPLANT
DRAPE C-ARM 42X72 X-RAY (DRAPES) ×4 IMPLANT
DRAPE LAPAROTOMY 100X72X124 (DRAPES) ×2 IMPLANT
DRAPE POUCH INSTRU U-SHP 10X18 (DRAPES) ×2 IMPLANT
DRSG PAD ABDOMINAL 8X10 ST (GAUZE/BANDAGES/DRESSINGS) ×1 IMPLANT
DURAPREP 26ML APPLICATOR (WOUND CARE) ×2 IMPLANT
ELECT REM PT RETURN 9FT ADLT (ELECTROSURGICAL) ×2
ELECTRODE REM PT RTRN 9FT ADLT (ELECTROSURGICAL) ×1 IMPLANT
EVACUATOR 1/8 PVC DRAIN (DRAIN) IMPLANT
GAUZE SPONGE 4X4 12PLY STRL (GAUZE/BANDAGES/DRESSINGS) ×2 IMPLANT
GAUZE SPONGE 4X4 16PLY XRAY LF (GAUZE/BANDAGES/DRESSINGS) ×2 IMPLANT
GLOVE BIOGEL M 8.0 STRL (GLOVE) ×2 IMPLANT
GLOVE EXAM NITRILE LRG STRL (GLOVE) IMPLANT
GLOVE EXAM NITRILE MD LF STRL (GLOVE) IMPLANT
GLOVE EXAM NITRILE XL STR (GLOVE) IMPLANT
GLOVE EXAM NITRILE XS STR PU (GLOVE) IMPLANT
GOWN STRL REUS W/ TWL LRG LVL3 (GOWN DISPOSABLE) ×1 IMPLANT
GOWN STRL REUS W/ TWL XL LVL3 (GOWN DISPOSABLE) IMPLANT
GOWN STRL REUS W/TWL 2XL LVL3 (GOWN DISPOSABLE) IMPLANT
GOWN STRL REUS W/TWL LRG LVL3 (GOWN DISPOSABLE) ×2
GOWN STRL REUS W/TWL XL LVL3 (GOWN DISPOSABLE)
KIT BASIN OR (CUSTOM PROCEDURE TRAY) ×2 IMPLANT
KIT INFUSE MEDIUM (Orthopedic Implant) ×1 IMPLANT
KIT ROOM TURNOVER OR (KITS) ×2 IMPLANT
NDL HYPO 18GX1.5 BLUNT FILL (NEEDLE) IMPLANT
NDL HYPO 21X1.5 SAFETY (NEEDLE) IMPLANT
NDL HYPO 25X1 1.5 SAFETY (NEEDLE) IMPLANT
NEEDLE HYPO 18GX1.5 BLUNT FILL (NEEDLE) IMPLANT
NEEDLE HYPO 21X1.5 SAFETY (NEEDLE) IMPLANT
NEEDLE HYPO 25X1 1.5 SAFETY (NEEDLE) IMPLANT
NS IRRIG 1000ML POUR BTL (IV SOLUTION) ×2 IMPLANT
PACK LAMINECTOMY NEURO (CUSTOM PROCEDURE TRAY) ×2 IMPLANT
PAD ARMBOARD 7.5X6 YLW CONV (MISCELLANEOUS) ×6 IMPLANT
PATTIES SURGICAL .5 X1 (DISPOSABLE) ×2 IMPLANT
PATTIES SURGICAL .5 X3 (DISPOSABLE) IMPLANT
ROD 85MM SPINAL (Rod) ×1 IMPLANT
ROD SPINE STR CREO 5.5X65 (Rod) ×1 IMPLANT
SCREW 6.5X45 (Screw) ×2 IMPLANT
SPACER RISE 8X22 11-17MM-15 (Spacer) ×2 IMPLANT
SPONGE LAP 4X18 X RAY DECT (DISPOSABLE) IMPLANT
SPONGE NEURO XRAY DETECT 1X3 (DISPOSABLE) IMPLANT
SPONGE SURGIFOAM ABS GEL 100 (HEMOSTASIS) ×2 IMPLANT
STRIP CLOSURE SKIN 1/2X4 (GAUZE/BANDAGES/DRESSINGS) ×2 IMPLANT
SUT VIC AB 1 CT1 18XBRD ANBCTR (SUTURE) ×2 IMPLANT
SUT VIC AB 1 CT1 8-18 (SUTURE) ×4
SUT VIC AB 2-0 CP2 18 (SUTURE) ×2 IMPLANT
SUT VIC AB 3-0 SH 8-18 (SUTURE) ×2 IMPLANT
SYR 20ML ECCENTRIC (SYRINGE) ×2 IMPLANT
SYR 5ML LL (SYRINGE) IMPLANT
TAPE CLOTH SURG 4X10 WHT LF (GAUZE/BANDAGES/DRESSINGS) ×1 IMPLANT
TOWEL OR 17X24 6PK STRL BLUE (TOWEL DISPOSABLE) ×2 IMPLANT
TOWEL OR 17X26 10 PK STRL BLUE (TOWEL DISPOSABLE) ×2 IMPLANT
TRAY FOLEY W/METER SILVER 14FR (SET/KITS/TRAYS/PACK) ×2 IMPLANT
WATER STERILE IRR 1000ML POUR (IV SOLUTION) ×2 IMPLANT

## 2015-04-28 NOTE — OR Nursing (Signed)
A standard 48F foley catheter was placed following airway intubation. Insertion was unremarkable; there was no resistance while inserting the catheter, the catheter was inserted to the y-port, and there was no resistance upon inflating the balloon. There was no fluid, urine or otherwise observed in the catheter tubing or drainage bag. The patient was flipped from supine to prone onto the operating table by OR staff. The patient was positioned on the operating table, and there was still no fluid observed. Prior to the surgery start, Dr. Smith Robert and Dr. Joya Salm were told that there was no fluid in the catheter. They asked for the catheter to be removed. I removed the catheter after deflating the anchor balloon; the full 10cc of sterile water was returned. There was a light pink colored fluid present on the tip of the catheter, no urine or blood was observed coming from the meatus following removal of the catheter.

## 2015-04-28 NOTE — Progress Notes (Signed)
   04/28/15 1715  Vitals  Temp 97.7 F (36.5 C)  Temp Source Axillary  BP 124/61 mmHg  BP Location Right Arm  BP Method Automatic  Patient Position (if appropriate) Lying  Pulse Rate (!) 57  Pulse Rate Source Dinamap  Resp 17  Oxygen Therapy  SpO2 100 %  O2 Device Nasal Cannula  O2 Flow Rate (L/min) 2 L/min  Pain Assessment  Pain Assessment 0-10  Pain Score 7  Pain Type Surgical pain  Pain Location Back  Pain Orientation Lower  Pain Radiating Towards L hip  Pain Descriptors / Indicators Tightness;Numbness  Pain Intervention(s) Medication (See eMAR) (pt has PCA pump)  Pt arrived to 5C20 at 1715.  Pt A&O x 4, c/o 7/10 lower back surgical pain with some radiation to the L hip, site covered with CDI honeycomb dressing, Hemovac patent/charged/draining.   Pt V/S taken, see above, fluids running at 75 cc/hr.  Foley intact, unclamped. Pt without distress.  Family at the bedside. Diet ordered, will monitor.

## 2015-04-28 NOTE — Anesthesia Postprocedure Evaluation (Signed)
  Anesthesia Post-op Note  Patient: Jacob Larsen  Procedure(s) Performed: Procedure(s) with comments: L3-4 Posterior lumbar interbody fusion (N/A) - L3-4 Posterior lumbar interbody fusion  Patient Location: PACU  Anesthesia Type:General  Level of Consciousness: awake, alert  and oriented  Airway and Oxygen Therapy: Patient Spontanous Breathing  Post-op Pain: moderate  Post-op Assessment: Post-op Vital signs reviewed and Patient's Cardiovascular Status Stable              Post-op Vital Signs: Reviewed and stable  Last Vitals:  Filed Vitals:   04/28/15 1455  BP:   Pulse: 60  Temp:   Resp: 9    Complications: No apparent anesthesia complications

## 2015-04-28 NOTE — Anesthesia Procedure Notes (Signed)
Procedure Name: Intubation Date/Time: 04/28/2015 9:17 AM Performed by: Jenne Campus Pre-anesthesia Checklist: Patient identified, Emergency Drugs available, Suction available, Patient being monitored and Timeout performed Patient Re-evaluated:Patient Re-evaluated prior to inductionOxygen Delivery Method: Circle system utilized Preoxygenation: Pre-oxygenation with 100% oxygen Intubation Type: IV induction Ventilation: Mask ventilation without difficulty Laryngoscope Size: Miller and 3 Grade View: Grade I Tube type: Oral Tube size: 7.5 mm Number of attempts: 1 Airway Equipment and Method: Stylet Placement Confirmation: ETT inserted through vocal cords under direct vision,  positive ETCO2,  CO2 detector and breath sounds checked- equal and bilateral Secured at: 23 cm Tube secured with: Tape Dental Injury: Teeth and Oropharynx as per pre-operative assessment

## 2015-04-28 NOTE — Transfer of Care (Signed)
Immediate Anesthesia Transfer of Care Note  Patient: Jacob Larsen  Procedure(s) Performed: Procedure(s) with comments: L3-4 Posterior lumbar interbody fusion (N/A) - L3-4 Posterior lumbar interbody fusion  Patient Location: PACU  Anesthesia Type:General  Level of Consciousness: sedated and patient cooperative  Airway & Oxygen Therapy: Patient Spontanous Breathing and Patient connected to face mask oxygen  Post-op Assessment: Report given to RN and Post -op Vital signs reviewed and stable  Post vital signs: Reviewed  Last Vitals:  Filed Vitals:   04/28/15 0624  BP: 132/78  Pulse: 52  Temp: 36.3 C  Resp: 18    Complications: No apparent anesthesia complications

## 2015-04-28 NOTE — Anesthesia Preprocedure Evaluation (Addendum)
Anesthesia Evaluation  Patient identified by MRN, date of birth, ID band Patient awake    Reviewed: Allergy & Precautions, NPO status , Patient's Chart, lab work & pertinent test results  History of Anesthesia Complications Negative for: history of anesthetic complications  Airway Mallampati: I  TM Distance: >3 FB Neck ROM: Full    Dental  (+) Teeth Intact, Dental Advisory Given   Pulmonary former smoker,  breath sounds clear to auscultation        Cardiovascular hypertension, Pt. on medications Rhythm:Regular Rate:Normal  Limited activity secondary to foot drop on L.    Neuro/Psych PSYCHIATRIC DISORDERS Anxiety    GI/Hepatic Neg liver ROS, GERD-  Medicated and Controlled,  Endo/Other  negative endocrine ROS  Renal/GU negative Renal ROS  negative genitourinary   Musculoskeletal  (+) Arthritis -, Osteoarthritis,    Abdominal   Peds negative pediatric ROS (+)  Hematology negative hematology ROS (+)   Anesthesia Other Findings   Reproductive/Obstetrics negative OB ROS                           Lab Results  Component Value Date   WBC 7.1 04/16/2015   HGB 15.9 04/16/2015   HCT 46.4 04/16/2015   MCV 95.9 04/16/2015   PLT 254 04/16/2015   Lab Results  Component Value Date   CREATININE 1.23 04/16/2015   BUN 18 04/16/2015   NA 137 04/16/2015   K 3.9 04/16/2015   CL 103 04/16/2015   CO2 25 04/16/2015   No results found for: INR, PROTIME   EKG: normal sinus rhythm.   Anesthesia Physical Anesthesia Plan  ASA: II  Anesthesia Plan: General   Post-op Pain Management:    Induction: Intravenous  Airway Management Planned: Oral ETT  Additional Equipment:   Intra-op Plan:   Post-operative Plan: Extubation in OR  Informed Consent: I have reviewed the patients History and Physical, chart, labs and discussed the procedure including the risks, benefits and alternatives for the  proposed anesthesia with the patient or authorized representative who has indicated his/her understanding and acceptance.   Dental advisory given  Plan Discussed with: CRNA  Anesthesia Plan Comments: (Weakness on L LE prior to surgery. )       Anesthesia Quick Evaluation

## 2015-04-29 MED ORDER — PROCHLORPERAZINE MALEATE 10 MG PO TABS
10.0000 mg | ORAL_TABLET | Freq: Four times a day (QID) | ORAL | Status: DC | PRN
  Administered 2015-04-29: 10 mg via ORAL
  Filled 2015-04-29 (×2): qty 1

## 2015-04-29 MED ORDER — PROMETHAZINE HCL 25 MG/ML IJ SOLN
12.5000 mg | Freq: Once | INTRAMUSCULAR | Status: AC
  Administered 2015-04-29: 12.5 mg via INTRAVENOUS
  Filled 2015-04-29: qty 1

## 2015-04-29 NOTE — Progress Notes (Signed)
Patient ID: Jacob Larsen, male   DOB: October 03, 1950, 64 y.o.   MRN: [id number] C/o hippcus. Reflux. Left leg pain better. No weakness.see orders

## 2015-04-29 NOTE — Progress Notes (Signed)
Pt placed back on wall o2, shutdown complete

## 2015-04-29 NOTE — Op Note (Signed)
NAMELESHAUN, BIEBEL NO.:  [id number]  MEDICAL RECORD NO.:  [id number]  LOCATION:  5C20C                        FACILITY:  [facility]  PHYSICIAN:  Leeroy Cha, M.D.   DATE OF BIRTH:  Sep 23, 1950  DATE OF PROCEDURE:  04/28/2015 DATE OF DISCHARGE:                              OPERATIVE REPORT   PREOPERATIVE DIAGNOSIS:  An L3-L4 spondylolisthesis.  Stenosis. Degenerative disc disease.  Chronic and acute left radiculopathy. Status post fusion, L4-L5.  SURGEON:  Leeroy Cha, M.D.  ASSISTANT:  Cooper Render. Pool, M.D.  CLINICAL HISTORY:  Mr. Cordial is a gentleman, who in the past underwent surgery at the level of L4-5.  The patient did well, but lately he had been complaining of back pain worsened to the left leg associated with weakness of the quads.  The patient has failed with conservative treatment and a myelogram showed that he has been stable between L3-4 with degenerative disc disease bilaterally, left worse than right one. We talked about surgery, we talked about discectomy, we talked about fusion at the end.  We agreed because of the instability of L3-4, it better will be to proceed with fusion at that area.  He and his wife knew the risk and benefit of surgery.  DESCRIPTION OF PROCEDURE:  The patient was taken to the OR and after intubation, he was positioned in a prone manner.  The back was cleaned with DuraPrep and Betadine.  Drapes were applied.  Midline incision resecting the pre-wound was made and the incision was carried down through the skin, through a scar tissue, straight down to the muscle. Retraction was done all the way laterally until we were able to see the screws as well as the cross link.  Both of them were full of bone. Then, with the x-ray we identified the L3-L4 space.  The spinous process of L3 as well as the lamina were removed as well as the facet.  The patient had quite a bit of scar tissue.  Lysis of adhesion was done and we entered  the disc space first in the right side then in the left drain and total gross discectomy was achieved.  The endplate was removed. From then on, we introduced 2 cages, which was expanded to 16.  The cages had BMP and autograft inside.  The rest of the disc space were filled up with the same material.  The pain would proceed with removal of the cross link.  We made a space to introduce 2 lateral hooks.  Then at the level of L3, we made a hole in the pedicle on AP view.  Prior to insertion of the 2 new screws, which were 6.5 x 45, we were sure that all the 4 quadrants were surrounded by bone.  With the hooks going from the cross link between L4-5, we put 2 bars that went straight up to from the hook all the way down to the pedicle screws.  Good alignment was achieved.  The area was irrigated.  We did Valsalva maneuver. Vancomycin was left in the operative site.  Hemovac was left and the wound was closed in different layers of Vicryl and Steri-Strips.  ______________________________ Leeroy Cha, M.D.    EB/MEDQ  D:  04/28/2015  T:  04/29/2015  Job:  [id number]

## 2015-04-29 NOTE — Progress Notes (Signed)
Pt ambulated from the bed to the chair with rolling walker brace on and aligned to eat dinner. No noted distress. Pain controlled by PCA. Safety measures in place. Call bell within reach. Will continue to monitor.

## 2015-04-29 NOTE — Progress Notes (Signed)
o2 tank psi 700, changed tank, now 2000psi

## 2015-04-29 NOTE — Progress Notes (Signed)
O2 check, 1100 psi left in tank at bedside

## 2015-04-29 NOTE — Progress Notes (Signed)
Occupational Therapy Evaluation Patient Details Name: Jacob Larsen MRN: [id number] DOB: Jul 13, 1951 Today's Date: 04/29/2015    History of Present Illness s/p Posterior lumbar fusion 1 level   Clinical Impression   Pt admitted with the above diagnoses and presents with below problem list. Pt will benefit from continued acute OT to address the below listed deficits and maximize independence with BADLs. PTA pt was independent with ADLs. Pt is currently mod A with LB ADLs and transfers. ADLs completed and education provided as detailed below. Of note, pt with O2 stats dropping to 77 during SPT while on 1L per minute of O2. With pursed lip breathing O2 recovered to 95. Pt c/o LLE weakness.  OT to continue to follow acutely.     Follow Up Recommendations  Supervision/Assistance - 24 hour;Home health OT    Equipment Recommendations  None recommended by OT    Recommendations for Other Services       Precautions / Restrictions Precautions Precautions: Back;Fall Precaution Booklet Issued: Yes (comment) Precaution Comments: handout in room, reviewed precautions Required Braces or Orthoses: Spinal Brace Spinal Brace: Lumbar corset;Applied in sitting position Restrictions Weight Bearing Restrictions: No      Mobility Bed Mobility Overal bed mobility: Needs Assistance Bed Mobility: Sit to Sidelying         Sit to sidelying: Mod assist General bed mobility comments: mod A to advance BLE and maintain precautions  Transfers Overall transfer level: Needs assistance Equipment used: Rolling walker (2 wheeled) Transfers: Sit to/from Stand Sit to Stand: Mod assist         General transfer comment: mod A from recliner for powerup and balance    Balance Overall balance assessment: Needs assistance;History of Falls Sitting-balance support: Feet supported Sitting balance-Leahy Scale: Fair     Standing balance support: Bilateral upper extremity supported;During functional  activity   Standing balance comment: rw for balance; pt reports 3 falls at home                            ADL Overall ADL's : Needs assistance/impaired Eating/Feeding: Set up;Sitting   Grooming: Oral care;Set up;Sitting   Upper Body Bathing: Minimal assitance;Sitting   Lower Body Bathing: Moderate assistance;Sit to/from stand   Upper Body Dressing : Minimal assistance;Sitting   Lower Body Dressing: Moderate assistance;Sit to/from stand   Toilet Transfer: Minimal assistance;Stand-pivot;BSC;RW   Toileting- Clothing Manipulation and Hygiene: Minimal assistance;Sit to/from stand;Moderate assistance       Functional mobility during ADLs: Minimal assistance General ADL Comments: Pt completed SPT from recliner to EOB with min A. Bed mobility completed as detailed below. ADL education provided.     Vision     Perception     Praxis      Pertinent Vitals/Pain Pain Assessment: Faces Faces Pain Scale: Hurts little more Pain Location: back Pain Descriptors / Indicators: Aching;Burning;Grimacing Pain Intervention(s): Limited activity within patient's tolerance;Monitored during session;Repositioned;PCA encouraged;Utilized relaxation techniques     Hand Dominance Right   Extremity/Trunk Assessment Upper Extremity Assessment Upper Extremity Assessment: Overall WFL for tasks assessed   Lower Extremity Assessment Lower Extremity Assessment: Defer to PT evaluation       Communication Communication Communication: No difficulties   Cognition Arousal/Alertness: Awake/alert Behavior During Therapy: WFL for tasks assessed/performed Overall Cognitive Status: Within Functional Limits for tasks assessed                     General Comments  Exercises       Shoulder Instructions      Home Living Family/patient expects to be discharged to:: Private residence Living Arrangements: Spouse/significant other Available Help at Discharge: Family;Available  24 hours/day Type of Home: House Home Access: Stairs to enter [employer] of Steps: 3 Entrance Stairs-Rails: Right Home Layout: One level     Bathroom Shower/Tub: Walk-in shower;Door   [employer] Toilet: Standard Bathroom Accessibility: Yes How Accessible: Accessible via walker Home Equipment: Bedside commode   Additional Comments: pt retired, likes to fish      Prior Functioning/Environment Level of Independence: Independent        Comments: used a cane close to surgery    OT Diagnosis: Acute pain   OT Problem List: Impaired balance (sitting and/or standing);Decreased knowledge of use of DME or AE;Decreased knowledge of precautions;Pain   OT Treatment/Interventions: Self-care/ADL training;DME and/or AE instruction;Therapeutic activities;Patient/family education;Balance training    OT Goals(Current goals can be found in the care plan section) Acute Rehab OT Goals Patient Stated Goal: not stated OT Goal Formulation: With patient Time For Goal Achievement: 05/06/15 Potential to Achieve Goals: Good ADL Goals Pt Will Perform Grooming: with modified independence;with adaptive equipment;sitting Pt Will Perform Lower Body Bathing: with min guard assist;sit to/from stand;with adaptive equipment Pt Will Perform Lower Body Dressing: with min guard assist;with adaptive equipment;sit to/from stand Pt Will Transfer to Toilet: with min guard assist;ambulating (3n1 over toilet) Pt Will Perform Toileting - Clothing Manipulation and hygiene: with min guard assist;with adaptive equipment;sit to/from stand Pt Will Perform Tub/Shower Transfer: Shower transfer;ambulating;3 in 1;rolling walker Additional ADL Goal #1: PT will complete bed mobility at mod I level to prepare for OOB ADLs.  OT Frequency: Min 2X/week   Barriers to D/C:            Co-evaluation              End of Session Equipment Utilized During Treatment: Gait belt;Rolling walker;Back  brace;Oxygen  Activity Tolerance: Patient tolerated treatment well;Patient limited by fatigue;Patient limited by pain Patient left: in bed;with call bell/phone within reach;with family/visitor present;with SCD's reapplied   Time: 0539-7673 OT Time Calculation (min): 44 min Charges:  OT General Charges $OT Visit: 1 Procedure OT Evaluation $Initial OT Evaluation Tier I: 1 Procedure OT Treatments $Self Care/Home Management : 23-37 mins G-Codes:    Hortencia Pilar 2015-05-11, 1:32 PM

## 2015-04-29 NOTE — Evaluation (Signed)
Physical Therapy Evaluation Patient Details Name: Jacob Larsen MRN: [id number] DOB: June 06, 1951 Today's Date: 04/29/2015   History of Present Illness  s/p Posterior lumbar fusion 1 level  Clinical Impression  Patient demonstrates deficits in functional mobility as indicated below. Will need continued skilled PT to address deficits and maximize function. Will see as indicated and progress as tolerated.    Follow Up Recommendations Home health PT;Supervision/Assistance - 24 hour (pending progress)    Equipment Recommendations  None recommended by PT    Recommendations for Other Services       Precautions / Restrictions Precautions Precautions: Back;Fall Precaution Booklet Issued: Yes (comment) Precaution Comments: handout in room, reviewed precautions Required Braces or Orthoses: Spinal Brace Spinal Brace: Lumbar corset;Applied in sitting position Restrictions Weight Bearing Restrictions: No      Mobility  Bed Mobility Overal bed mobility: Needs Assistance Bed Mobility: Sit to Sidelying         Sit to sidelying: Mod assist General bed mobility comments: mod A to advance BLE and maintain precautions  Transfers Overall transfer level: Needs assistance Equipment used: Rolling walker (2 wheeled) Transfers: Sit to/from [facility] Sit to Stand: Min assist Stand pivot transfers: Min guard       General transfer comment: min assist from elevated surface of bed, VCs for hand placement and positioning, Min guard for pivot to chair with VCs for posture and positioning  Ambulation/Gait                Stairs            Wheelchair Mobility    Modified Rankin (Stroke Patients Only)       Balance Overall balance assessment: Needs assistance;History of Falls Sitting-balance support: Feet supported Sitting balance-Leahy Scale: Fair     Standing balance support: Bilateral upper extremity supported;During functional activity Standing  balance-Leahy Scale: Fair Standing balance comment: Increased reliance on RW                             Pertinent Vitals/Pain Pain Assessment: 0-10 Pain Score: 8  Faces Pain Scale: Hurts little more Pain Location: back Pain Descriptors / Indicators: Sore Pain Intervention(s): Limited activity within patient's tolerance;Monitored during session;Repositioned;PCA encouraged    Home Living Family/patient expects to be discharged to:: Private residence Living Arrangements: Spouse/significant other Available Help at Discharge: Family;Available 24 hours/day Type of Home: House Home Access: Stairs to enter Entrance Stairs-Rails: Right Entrance Stairs-Number of Steps: 3 Home Layout: One level Home Equipment: Bedside commode Additional Comments: pt retired, likes to fish    Prior Function Level of Independence: Independent         Comments: used a cane close to surgery     Hand Dominance   Dominant Hand: Right    Extremity/Trunk Assessment   Upper Extremity Assessment: Overall WFL for tasks assessed           Lower Extremity Assessment: LLE deficits/detail   LLE Deficits / Details: Generalized gross motor weakness 3+/5     Communication   Communication: No difficulties  Cognition Arousal/Alertness: Awake/alert Behavior During Therapy: WFL for tasks assessed/performed Overall Cognitive Status: Within Functional Limits for tasks assessed                      General Comments      Exercises        Assessment/Plan    PT Assessment Patient needs continued PT services  PT Diagnosis  Difficulty walking;Abnormality of gait;Generalized weakness;Acute pain   PT Problem List Decreased strength;Decreased activity tolerance;Decreased balance;Decreased mobility;Decreased coordination;Pain  PT Treatment Interventions DME instruction;Gait training;Stair training;Functional mobility training;Therapeutic activities;Therapeutic exercise;Balance  training;Patient/family education   PT Goals (Current goals can be found in the Care Plan section) Acute Rehab PT Goals Patient Stated Goal: to go home PT Goal Formulation: With patient Time For Goal Achievement: 05/13/15 Potential to Achieve Goals: Good    Frequency Min 5X/week   Barriers to discharge        Co-evaluation               End of Session Equipment Utilized During Treatment: Gait belt;Back brace Activity Tolerance: Patient tolerated treatment well;Patient limited by pain Patient left: in chair;with call bell/phone within reach;with chair alarm set Nurse Communication: Mobility status         Time: 7614-7092 PT Time Calculation (min) (ACUTE ONLY): 25 min   Charges:   PT Evaluation $Initial PT Evaluation Tier I: 1 Procedure PT Treatments $Therapeutic Activity: 8-22 mins   PT G CodesDuncan Dull 05-26-15, 1:45 PM Alben Deeds, [place] DPT  [phone]

## 2015-04-30 LAB — GLUCOSE, CAPILLARY: GLUCOSE-CAPILLARY: 107 mg/dL — AB (ref 65–99)

## 2015-04-30 MED ORDER — OXYCODONE HCL 5 MG PO TABS
20.0000 mg | ORAL_TABLET | ORAL | Status: DC | PRN
Start: 2015-04-30 — End: 2015-05-02
  Administered 2015-04-30 – 2015-05-02 (×10): 20 mg via ORAL
  Filled 2015-04-30 (×11): qty 4

## 2015-04-30 MED ORDER — BISACODYL 10 MG RE SUPP: 10.0000 mg | Freq: Every day | RECTAL | Status: DC | PRN

## 2015-04-30 NOTE — Progress Notes (Signed)
Physical Therapy Treatment Patient Details Name: Jacob Larsen MRN: [id number] DOB: 1951/07/31 Today's Date: 04/30/2015    History of Present Illness s/p Posterior lumbar fusion 1 level    PT Comments    Patient progressing towards PT goals. Ambulated in hall on 2 liters O2 with min guard/supervision and occasional cues. Patient did require min assist to come to standing from lower surface, educated on hand placement and positioning. Will continue to see as indicated and progress as tolerated.   Follow Up Recommendations  Home health PT;Supervision/Assistance - 24 hour (pending progress)     Equipment Recommendations  None recommended by PT    Recommendations for Other Services       Precautions / Restrictions Precautions Precautions: Back;Fall Precaution Booklet Issued: Yes (comment) Precaution Comments: handout in room, reviewed precautions Required Braces or Orthoses: Spinal Brace Spinal Brace: Lumbar corset;Applied in sitting position Restrictions Weight Bearing Restrictions: No    Mobility  Bed Mobility               General bed mobility comments: received in chair  Transfers Overall transfer level: Needs assistance Equipment used: Rolling walker (2 wheeled) Transfers: Sit to/from Stand Sit to Stand: Min assist         General transfer comment: Min assist for left side secondary to LE spasm, good physical strength to power up. VCs for hand placement and safety with mobility  Ambulation/Gait Ambulation/Gait assistance: Min guard;Supervision Ambulation Distance (Feet): 210 Feet Assistive device: Rolling walker (2 wheeled) Gait Pattern/deviations: Step-through pattern;Decreased stride length;Decreased weight shift to left;Narrow base of support Gait velocity: decreased Gait velocity interpretation: Below normal speed for age/gender General Gait Details: Patient required VCs for relaxation and increased cadence with mobility. No physical assist needed.  2 standing rest breaks.    Stairs            Wheelchair Mobility    Modified Rankin (Stroke Patients Only)       Balance   Sitting-balance support: Feet supported Sitting balance-Leahy Scale: Good     Standing balance support: Bilateral upper extremity supported Standing balance-Leahy Scale: Fair Standing balance comment: Increased reliance on RW for support secondary to pain and LLE weakness                    Cognition Arousal/Alertness: Awake/alert Behavior During Therapy: WFL for tasks assessed/performed Overall Cognitive Status: Within Functional Limits for tasks assessed                      Exercises      General Comments        Pertinent Vitals/Pain Pain Assessment: 0-10 Pain Score: 6  Pain Location: back  Pain Descriptors / Indicators: Sore Pain Intervention(s): Limited activity within patient's tolerance;Monitored during session;Repositioned;PCA encouraged    Home Living Family/patient expects to be discharged to:: Private residence Living Arrangements: Spouse/significant other Available Help at Discharge: Family;Available 24 hours/day Type of Home: House Home Access: Stairs to enter Entrance Stairs-Rails: Right Home Layout: One level Home Equipment: Bedside commode Additional Comments: pt retired, likes to fish    Prior Function Level of Independence: Independent      Comments: used a cane close to surgery   PT Goals (current goals can now be found in the care plan section) Acute Rehab PT Goals Patient Stated Goal: to go home PT Goal Formulation: With patient Time For Goal Achievement: 05/13/15 Potential to Achieve Goals: Good Progress towards PT goals: Progressing toward goals  Frequency  Min 5X/week    PT Plan Current plan remains appropriate    Co-evaluation             End of Session Equipment Utilized During Treatment: Gait belt;Back brace;Oxygen (ambulated on 2 liters with cues for calm  breathing) Activity Tolerance: Patient tolerated treatment well;Patient limited by pain Patient left: in chair;with call bell/phone within reach;with chair alarm set     Time: 0254-2706 PT Time Calculation (min) (ACUTE ONLY): 20 min  Charges:  $Gait Training: 8-22 mins                    G CodesDuncan Larsen 05-23-2015, 10:04 AM Alben Deeds, PT DPT  [phone]

## 2015-04-30 NOTE — Clinical Social Work Note (Signed)
CSW consult acknowledged:  Clinical Social Worker received consult in reference to post-acute placement for SNF. PT currently recommending Home Health.   Clinical Social Worker will sign off for now as social work intervention is no longer needed. Please consult [place] again if new need arises.  Glendon Axe, MSW, LCSWA [phone] 04/30/2015 3:07 PM

## 2015-04-30 NOTE — Progress Notes (Signed)
Patient ID: Jacob Larsen, male   DOB: 04/01/1951, 64 y.o.   MRN: [id number] Ambulating with help. Pain in operative area. Gi better see orders

## 2015-04-30 NOTE — Progress Notes (Signed)
Occupational Therapy Treatment Patient Details Name: Jacob Larsen MRN: [id number] DOB: 1951/07/01 Today's Date: 04/30/2015    History of present illness s/p Posterior lumbar fusion 1 level   OT comments  Pt progressing towards acute OT goals. Focus of session was toilet transfers as detailed below. Reviewed ADL education with family present. D/c plan remains appropriate.   Follow Up Recommendations  Supervision/Assistance - 24 hour;Home health OT    Equipment Recommendations  None recommended by OT    Recommendations for Other Services      Precautions / Restrictions Precautions Precautions: Back;Fall Precaution Booklet Issued: Yes (comment) Precaution Comments: handout in room, reviewed precautions Required Braces or Orthoses: Spinal Brace Spinal Brace: Lumbar corset;Applied in sitting position Restrictions Weight Bearing Restrictions: No       Mobility Bed Mobility Overal bed mobility: Needs Assistance Bed Mobility: Rolling;Sit to Sidelying Rolling: Min guard       Sit to sidelying: Min assist General bed mobility comments: min A to advance BLE onto bed while maintaining precautions. cues for technique for bed mobility.  Transfers Overall transfer level: Needs assistance Equipment used: Rolling walker (2 wheeled) Transfers: Sit to/from Stand Sit to Stand: Min assist         General transfer comment: from recliner, 3n1, EOB. Min A from lower height of recliner during powerup.    Balance Overall balance assessment: Needs assistance;History of Falls Sitting-balance support: Feet supported Sitting balance-Leahy Scale: Good     Standing balance support: Bilateral upper extremity supported;During functional activity Standing balance-Leahy Scale: Fair                     ADL Overall ADL's : Needs assistance/impaired                         Toilet Transfer: Ambulation;Minimal assistance;RW (3n1 over toilet) Toilet Transfer Details  (indicate cue type and reason): light min A/min guard to powerup while maintaining precautions, cues for technique       Tub/Shower Transfer Details (indicate cue type and reason): Educated on walk-in shower transfer to 3n1. Pt states he plans to sponge bath initially. Reviewed brace managmeent during showering. Functional mobility during ADLs: Min guard;Rolling walker General ADL Comments: Pt ambulated to bathroom and completed toilet transfer as detailed above. Reviewed ADL education including techniques and AE. Pt completed bed mobility as detailed below. Educated pt and family on boosting height of lower seat surfaces with pillows at home.      Vision                     Perception     Praxis      Cognition   Behavior During Therapy: WFL for tasks assessed/performed Overall Cognitive Status: Within Functional Limits for tasks assessed                       Extremity/Trunk Assessment               Exercises     Shoulder Instructions       General Comments      Pertinent Vitals/ Pain       Pain Assessment: 0-10 Pain Score: 8  Pain Location: Left thigh, at end of session Pain Descriptors / Indicators: Aching Pain Intervention(s): Repositioned;Patient requesting pain meds-RN notified;Monitored during session  Home Living  Prior Functioning/Environment              Frequency Min 2X/week     Progress Toward Goals  OT Goals(current goals can now be found in the care plan section)  Progress towards OT goals: Progressing toward goals  Acute Rehab OT Goals Patient Stated Goal: to go home OT Goal Formulation: With patient Time For Goal Achievement: 05/06/15 Potential to Achieve Goals: Good ADL Goals Pt Will Perform Grooming: with modified independence;with adaptive equipment;sitting Pt Will Perform Lower Body Bathing: with min guard assist;sit to/from stand;with adaptive  equipment Pt Will Perform Lower Body Dressing: with min guard assist;with adaptive equipment;sit to/from stand Pt Will Transfer to Toilet: with min guard assist;ambulating Pt Will Perform Toileting - Clothing Manipulation and hygiene: with min guard assist;with adaptive equipment;sit to/from stand Pt Will Perform Tub/Shower Transfer: Shower transfer;ambulating;3 in 1;rolling walker Additional ADL Goal #1: PT will complete bed mobility at mod I level to prepare for OOB ADLs.  Plan Discharge plan remains appropriate    Co-evaluation                 End of Session Equipment Utilized During Treatment: Gait belt;Rolling walker;Back brace   Activity Tolerance Patient tolerated treatment well   Patient Left in bed;with call bell/phone within reach;with bed alarm set;with family/visitor present;with SCD's reapplied   Nurse Communication Patient requests pain meds        Time: 2549-8264 OT Time Calculation (min): 28 min  Charges: OT General Charges $OT Visit: 1 Procedure OT Treatments $Self Care/Home Management : 23-37 mins  Hortencia Pilar 04/30/2015, 1:54 PM

## 2015-04-30 NOTE — Progress Notes (Signed)
Utilization review completed. Elverda Wendel, RN, BSN. 

## 2015-04-30 NOTE — Progress Notes (Signed)
PCA discontinued earlier today. At the time of discontinuation of PCA morphine, 5 mg. Remained. Wasted in sharps with Melanee Left, RN at this time.

## 2015-05-01 MED ORDER — BISACODYL 5 MG PO TBEC
5.0000 mg | DELAYED_RELEASE_TABLET | Freq: Every day | ORAL | Status: DC | PRN
  Administered 2015-05-01: 5 mg via ORAL
  Filled 2015-05-01: qty 1

## 2015-05-01 NOTE — Progress Notes (Signed)
Occupational Therapy Treatment Patient Details Name: Jacob Larsen MRN: [id number] DOB: September 20, 1950 Today's Date: 05/01/2015    History of present illness 64 yo male s/p PLIF L3-4  PMH: foot drop, chronic back pain, diverticulosis, anxiety, cervical surg, shoulder arthroscopy, back surg, knee surg bil septoplasty   OT comments  Pt and spouse with education complete on adls using handout. Pt without further questions at this time. Pt return demo don doff socks without AE required. Pt progressing toward goals and recommend HHOT for d/c home.    Follow Up Recommendations  Supervision/Assistance - 24 hour;Home health OT    Equipment Recommendations  None recommended by OT    Recommendations for Other Services      Precautions / Restrictions Precautions Precautions: Back;Fall Precaution Booklet Issued: Yes (comment) Precaution Comments: ABle to verbalize 3/3 precautions independently. Required Braces or Orthoses: Spinal Brace Spinal Brace: Lumbar corset;Applied in sitting position Restrictions Weight Bearing Restrictions: No       Mobility Bed Mobility               General bed mobility comments: in chair on arrival  Transfers Overall transfer level: Needs assistance Equipment used: Rolling walker (2 wheeled) Transfers: Sit to/from Stand Sit to Stand: Min guard         General transfer comment: Min A to boost from EOB. Cues for hand placement. Difficulty with transition of hand from bed to RW.    Balance Overall balance assessment: Needs assistance Sitting-balance support: Feet supported Sitting balance-Leahy Scale: Good     Standing balance support: During functional activity Standing balance-Leahy Scale: Fair                     ADL                       Lower Body Dressing: Modified independent;Sit to/from stand (able to cross bil LE) Lower Body Dressing Details (indicate cue type and reason): pt has reacher for home and educated on  other AE because patient asked. Pt able to cross bil LE and AE not required. pt wanting to keep OT bag present in room. pt educated on location and places that items can be purchased if desired               General ADL Comments: Pt educated on setting alarm for night medication, education on pillow arranagement for supine position educated on change position every 45 minutes and educated on home setup for safety with precautions. Pt and spouse agreeable and without further questions      Vision                     Perception     Praxis      Cognition   Behavior During Therapy: [facility] for tasks assessed/performed Overall Cognitive Status: Within Functional Limits for tasks assessed                       Extremity/Trunk Assessment               Exercises     Shoulder Instructions       General Comments      Pertinent Vitals/ Pain       Pain Assessment: No/denies pain  Home Living  Prior Functioning/Environment              Frequency Min 2X/week     Progress Toward Goals  OT Goals(current goals can now be found in the care plan section)  Progress towards OT goals: Progressing toward goals  Acute Rehab OT Goals Patient Stated Goal: to go home OT Goal Formulation: With patient Time For Goal Achievement: 05/06/15 Potential to Achieve Goals: Good ADL Goals Pt Will Perform Grooming: with modified independence;with adaptive equipment;sitting Pt Will Perform Lower Body Bathing: with min guard assist;sit to/from stand;with adaptive equipment Pt Will Perform Lower Body Dressing: with min guard assist;with adaptive equipment;sit to/from stand Pt Will Transfer to Toilet: with min guard assist;ambulating Pt Will Perform Toileting - Clothing Manipulation and hygiene: with min guard assist;with adaptive equipment;sit to/from stand Pt Will Perform Tub/Shower Transfer: Shower  transfer;ambulating;3 in 1;rolling walker Additional ADL Goal #1: PT will complete bed mobility at mod I level to prepare for OOB ADLs.  Plan Discharge plan remains appropriate    Co-evaluation                 End of Session Equipment Utilized During Treatment: Gait belt;Rolling walker;Back brace   Activity Tolerance Patient tolerated treatment well   Patient Left in chair;with call bell/phone within reach;with chair alarm set;with family/visitor present   Nurse Communication Mobility status;Precautions        Time: 7614-7092 OT Time Calculation (min): 26 min  Charges: OT General Charges $OT Visit: 1 Procedure OT Treatments $Self Care/Home Management : 23-37 mins  Parke Poisson B 05/01/2015, 2:04 PM

## 2015-05-01 NOTE — Progress Notes (Signed)
Physical Therapy Treatment Patient Details Name: Jacob Larsen MRN: [id number] DOB: 28-Nov-1950 Today's Date: 05/01/2015    History of Present Illness s/p Posterior lumbar fusion 1 level    PT Comments    Patient progressing well towards PT goals. Improved ambulation distance today. Performed stair training this AM. Pt reports some difficulty with bed mobility. Fatigue noted. Will focus on bed mobility and transfers next session as tolerated. Will follow acutely to maximize independence and mobility prior to return home.  Follow Up Recommendations  Home health PT;Supervision/Assistance - 24 hour     Equipment Recommendations  None recommended by PT    Recommendations for Other Services       Precautions / Restrictions Precautions Precautions: Back;Fall Precaution Booklet Issued: Yes (comment) Precaution Comments: ABle to verbalize 3/3 precautions independently. Required Braces or Orthoses: Spinal Brace Spinal Brace: Lumbar corset;Applied in sitting position Restrictions Weight Bearing Restrictions: No    Mobility  Bed Mobility               General bed mobility comments: Sitting EOB upon PT arrival.   Transfers Overall transfer level: Needs assistance Equipment used: Rolling walker (2 wheeled) Transfers: Sit to/from Stand Sit to Stand: Min assist         General transfer comment: Min A to boost from EOB. Cues for hand placement. Difficulty with transition of hand from bed to RW.  Ambulation/Gait Ambulation/Gait assistance: Min guard Ambulation Distance (Feet): 220 Feet Assistive device: Rolling walker (2 wheeled) Gait Pattern/deviations: Step-through pattern;Decreased stride length Gait velocity: decreased Gait velocity interpretation: Below normal speed for age/gender General Gait Details: Cues for increased cadence and upright posture. 2 standing rest breaks.    Stairs Stairs: Yes Stairs assistance: Min guard Stair Management: Two rails;Step to  pattern Number of Stairs: 3 (+ 2 steps x2 bouts) General stair comments: Cues for technique. Min guard for safety.   Wheelchair Mobility    Modified Rankin (Stroke Patients Only)       Balance Overall balance assessment: Needs assistance Sitting-balance support: Feet supported;No upper extremity supported Sitting balance-Leahy Scale: Good     Standing balance support: During functional activity Standing balance-Leahy Scale: Fair                      Cognition Arousal/Alertness: Awake/alert Behavior During Therapy: WFL for tasks assessed/performed Overall Cognitive Status: Within Functional Limits for tasks assessed                      Exercises      General Comments        Pertinent Vitals/Pain Pain Assessment: No/denies pain    Home Living                      Prior Function            PT Goals (current goals can now be found in the care plan section) Progress towards PT goals: Progressing toward goals    Frequency  Min 5X/week    PT Plan Current plan remains appropriate    Co-evaluation             End of Session Equipment Utilized During Treatment: Gait belt;Back brace Activity Tolerance: Patient tolerated treatment well Patient left: in chair;with call bell/phone within reach     Time: 1026-1051 PT Time Calculation (min) (ACUTE ONLY): 25 min  Charges:  $Gait Training: 23-37 mins  G Codes:      Lacie Draft 05/01/2015, 12:33 PM Wray Kearns, [place], DPT [phone]

## 2015-05-01 NOTE — Progress Notes (Signed)
Patient ID: Jacob Larsen, male   DOB: 03/03/1951, 64 y.o.   MRN: [id number] Feeling better. C/o incisional pain . Pt helping. Dc in am?

## 2015-05-02 MED ORDER — OXYCODONE HCL 20 MG PO TABS: 20.0000 mg | ORAL_TABLET | ORAL | Status: DC | PRN

## 2015-05-02 NOTE — Progress Notes (Signed)
DC instruction and prescription for oxycodone given to patient and wife. Encouraged to call office for F/U appt on Monday. All questions answered. Patient in [facility] escorted to lobby by staff, to be transported home by wife in private vehicle.

## 2015-05-02 NOTE — Discharge Summary (Signed)
Physician Discharge Summary  Patient ID: Jacob Larsen MRN: [id number] DOB/AGE: August 24, 1951 64 y.o.  Admit date: 04/28/2015 Discharge date: 05/02/2015  Admission Diagnoses:  Discharge Diagnoses:  Active Problems:   Spondylolisthesis of lumbar region   Discharged Condition: good  Hospital Course: Surgery 3 days ago for L 34 plif. Did well. Increased activity well. Wound healing fine. Home pod 1, specific instructions given.  Consults: None  Significant Diagnostic Studies: none  Treatments: surgery: L 34 plif  Discharge Exam: Blood pressure 100/57, pulse 84, temperature 97.8 F (36.6 C), temperature source Oral, resp. rate 18, height 6\' 2"  (1.88 m), weight 84.823 kg (187 lb), SpO2 96 %. Incision/Wound:clean and dry; no new neuro issues  Disposition: 01-Home or Self Care     Medication List    ASK your doctor about these medications        atorvastatin 40 MG tablet  Commonly known as:  LIPITOR  Take 40 mg by mouth 3 (three) times a week. Tuesday, Thursday, Saturday     atorvastatin 80 MG tablet  Commonly known as:  LIPITOR  TAKE 1 TABLET (80 MG TOTAL) BY MOUTH DAILY. FOR CHOLESTEROL     diazepam 10 MG tablet  Commonly known as:  VALIUM  Take 10 mg by mouth every 6 (six) hours as needed for anxiety.     diclofenac 75 MG EC tablet  Commonly known as:  VOLTAREN  TAKE 1 TABLET BY MOUTH TWICE A DAY     Flax Seed Oil 1000 MG Caps  Take 1 capsule (1,000 mg total) by mouth 2 (two) times daily.     loratadine 10 MG tablet  Commonly known as:  CLARITIN  Take 10 mg by mouth as needed for allergies.     Magnesium 250 MG Tabs  Take 250 mg by mouth daily.     omeprazole 20 MG capsule  Commonly known as:  PRILOSEC  Take 1 capsule (20 mg total) by mouth 2 (two) times daily before a meal.     oxyCODONE 15 MG immediate release tablet  Commonly known as:  ROXICODONE  Take 7.5 mg by mouth.     predniSONE 10 MG tablet  Commonly known as:  DELTASONE  Take q day  6,5,4,3,2,1     ranitidine 300 MG tablet  Commonly known as:  ZANTAC  Take one tablet twice daily.     tamsulosin 0.4 MG Caps capsule  Commonly known as:  FLOMAX  TAKE ONE CAPSULE BY MOUTH EVERY DAY     tamsulosin 0.4 MG Caps capsule  Commonly known as:  FLOMAX  TAKE ONE CAPSULE BY MOUTH EVERY DAY     testosterone cypionate 200 MG/ML injection  Commonly known as:  DEPOTESTOSTERONE CYPIONATE  INJECT 2 MLS INTRAMUSCULARLY EVERY 2 WEEKS     VITAMIN B-12 PO  Take 1 tablet by mouth daily.     Vitamin D 2000 UNITS tablet  Take 4,000 Units by mouth daily.         At home rest most of the time. Get up 9 or 10 times each day and take a 15 or 20 minute walk. No riding in the car and to your first postoperative appointment. If you have neck surgery you may shower from the chest down starting on the third postoperative day. If you had back surgery he may start showering on the third postoperative day with saran wrap wrapped around your incisional area 3 times. After the shower remove the saran wrap. Take pain medicine as needed  and other medications as instructed. Call my office for an appointment.  SignedFaythe Ghee, MD 05/02/2015, 10:36 AM

## 2015-05-02 NOTE — Progress Notes (Signed)
Physical Therapy Treatment Patient Details Name: Jacob Larsen MRN: [id number] DOB: 1951/08/14 Today's Date: 05/02/2015    History of Present Illness 64 yo male s/p PLIF L3-4     PT Comments    Plan is for d/c home today. No f/u PT services indicated. D/C plan updated.  Follow Up Recommendations  No PT follow up;Supervision/Assistance - 24 hour     Equipment Recommendations  None recommended by PT    Recommendations for Other Services       Precautions / Restrictions Precautions Precautions: Back;Fall Precaution Comments: ABle to verbalize 3/3 precautions independently. Required Braces or Orthoses: Spinal Brace Spinal Brace: Lumbar corset;Applied in sitting position    Mobility  Bed Mobility     Rolling: Modified independent (Device/Increase time)       Sit to sidelying: Modified independent (Device/Increase time)    Transfers   Equipment used: Rolling walker (2 wheeled)   Sit to Stand: Supervision Stand pivot transfers: Supervision          Ambulation/Gait Ambulation/Gait assistance: Supervision Ambulation Distance (Feet): 250 Feet Assistive device: Rolling walker (2 wheeled) Gait Pattern/deviations: Step-through pattern;Trunk flexed Gait velocity: decreased   General Gait Details: verbal cues for posture   Stairs            Wheelchair Mobility    Modified Rankin (Stroke Patients Only)       Balance                                    Cognition Arousal/Alertness: Awake/alert Behavior During Therapy: WFL for tasks assessed/performed Overall Cognitive Status: Within Functional Limits for tasks assessed                      Exercises      General Comments        Pertinent Vitals/Pain Pain Assessment: No/denies pain    Home Living                      Prior Function            PT Goals (current goals can now be found in the care plan section) Acute Rehab PT Goals Patient Stated Goal: to  go home PT Goal Formulation: With patient Time For Goal Achievement: 05/13/15 Potential to Achieve Goals: Good Progress towards PT goals: Progressing toward goals    Frequency  Min 5X/week    PT Plan Discharge plan needs to be updated    Co-evaluation             End of Session Equipment Utilized During Treatment: Gait belt;Back brace Activity Tolerance: Patient tolerated treatment well Patient left: in chair;with call bell/phone within reach     Time: 0918-0943 PT Time Calculation (min) (ACUTE ONLY): 25 min  Charges:  $Gait Training: 23-37 mins                    G Codes:      Lorriane Shire 05/02/2015, 10:48 AM

## 2015-05-05 NOTE — OR Nursing (Signed)
Addendum created changing wound class to contaminated.

## 2015-05-13 ENCOUNTER — Ambulatory Visit (INDEPENDENT_AMBULATORY_CARE_PROVIDER_SITE_OTHER): Payer: 59 | Admitting: Physician Assistant

## 2015-05-13 ENCOUNTER — Encounter: Payer: Self-pay | Admitting: Physician Assistant

## 2015-05-13 VITALS — BP 126/78 | HR 64 | Temp 98.7°F | Resp 16 | Ht 73.5 in | Wt 184.8 lb

## 2015-05-13 DIAGNOSIS — R634 Abnormal weight loss: Secondary | ICD-10-CM

## 2015-05-13 DIAGNOSIS — R748 Abnormal levels of other serum enzymes: Secondary | ICD-10-CM

## 2015-05-13 DIAGNOSIS — Z87891 Personal history of nicotine dependence: Secondary | ICD-10-CM

## 2015-05-13 DIAGNOSIS — D75839 Thrombocytosis, unspecified: Secondary | ICD-10-CM

## 2015-05-13 DIAGNOSIS — D225 Melanocytic nevi of trunk: Secondary | ICD-10-CM

## 2015-05-13 DIAGNOSIS — D473 Essential (hemorrhagic) thrombocythemia: Secondary | ICD-10-CM

## 2015-05-13 DIAGNOSIS — N138 Other obstructive and reflux uropathy: Secondary | ICD-10-CM

## 2015-05-13 DIAGNOSIS — N401 Enlarged prostate with lower urinary tract symptoms: Secondary | ICD-10-CM | POA: Diagnosis not present

## 2015-05-13 DIAGNOSIS — Z1211 Encounter for screening for malignant neoplasm of colon: Secondary | ICD-10-CM | POA: Diagnosis not present

## 2015-05-13 DIAGNOSIS — Z720 Tobacco use: Secondary | ICD-10-CM | POA: Diagnosis not present

## 2015-05-13 DIAGNOSIS — M898X9 Other specified disorders of bone, unspecified site: Secondary | ICD-10-CM

## 2015-05-13 LAB — HEPATIC FUNCTION PANEL
ALBUMIN: 3.9 g/dL (ref 3.6–5.1)
ALT: 18 U/L (ref 9–46)
AST: 15 U/L (ref 10–35)
Alkaline Phosphatase: 122 U/L — ABNORMAL HIGH (ref 40–115)
BILIRUBIN TOTAL: 0.3 mg/dL (ref 0.2–1.2)
Bilirubin, Direct: 0.1 mg/dL (ref <=0.2)
Indirect Bilirubin: 0.2 mg/dL (ref 0.2–1.2)
TOTAL PROTEIN: 6.7 g/dL (ref 6.1–8.1)

## 2015-05-13 LAB — BASIC METABOLIC PANEL WITH GFR
BUN: 13 mg/dL (ref 7–25)
CALCIUM: 9.7 mg/dL (ref 8.6–10.3)
CHLORIDE: 104 mmol/L (ref 98–110)
CO2: 24 mmol/L (ref 20–31)
Creat: 1 mg/dL (ref 0.70–1.25)
GFR, EST NON AFRICAN AMERICAN: 79 mL/min (ref >=60)
GFR, Est African American: >89 mL/min (ref >=60)
GLUCOSE: 97 mg/dL (ref 65–99)
Potassium: 4.2 mmol/L (ref 3.5–5.3)
SODIUM: 139 mmol/L (ref 135–146)

## 2015-05-13 LAB — CBC WITH DIFFERENTIAL/PLATELET
BASOS PCT: 1 % (ref 0–1)
Basophils Absolute: 0.1 10*3/uL (ref 0.0–0.1)
EOS ABS: 0.1 10*3/uL (ref 0.0–0.7)
Eosinophils Relative: 2 % (ref 0–5)
HCT: 39.6 % (ref 39.0–52.0)
Hemoglobin: 13.4 g/dL (ref 13.0–17.0)
Lymphocytes Relative: 30 % (ref 12–46)
Lymphs Abs: 1.5 10*3/uL (ref 0.7–4.0)
MCH: 32.4 pg (ref 26.0–34.0)
MCHC: 33.8 g/dL (ref 30.0–36.0)
MCV: 95.9 fL (ref 78.0–100.0)
MONO ABS: 0.6 10*3/uL (ref 0.1–1.0)
MONOS PCT: 12 % (ref 3–12)
MPV: 9.2 fL (ref 8.6–12.4)
Neutro Abs: 2.8 10*3/uL (ref 1.7–7.7)
Neutrophils Relative %: 55 % (ref 43–77)
PLATELETS: 572 10*3/uL — AB (ref 150–400)
RBC: 4.13 MIL/uL — ABNORMAL LOW (ref 4.22–5.81)
RDW: 13.8 % (ref 11.5–15.5)
WBC: 5.1 10*3/uL (ref 4.0–10.5)

## 2015-05-13 MED ORDER — NALOXEGOL OXALATE 25 MG PO TABS: 25.0000 mg | ORAL_TABLET | Freq: Every day | ORAL | Status: DC

## 2015-05-13 NOTE — Progress Notes (Addendum)
Subjective:    Patient ID: Jacob Larsen, male    DOB: [birth year], 64 y.o.   MRN: [id number]  HPI 64 y.o. obese AAM with remote history of smoking Q in 30 years ago, smoked a pack every 3 days, HTN, preDM, chol, GERD presents for unintentional weight loss. From our records, he has lost  23 lbs from Jan. He has had 2 back surgeries with Dr. Joya Salm, had one in Jan fusion L4-L5 and on on the 16th of this month for spinal fusion L3-L4. He has been on pain medications, has been on oxycodone. He states that he has had decreased appetite, decrease in energy, denies night sweats. He does admit to some depression after the surgery due to chronic pain and not being able to do as much as he did before. He has been on boost, 1-2 a day.   Last CXR 09/2014: Streaky left base retrocardiac atelectasis or infiltrate best seen on lateral view. Colonoscopy: 2009, Dr. Deatra Ina  Lab Results  Component Value Date   WBC 7.1 04/16/2015   HGB 15.9 04/16/2015   HCT 46.4 04/16/2015   MCV 95.9 04/16/2015   PLT 254 04/16/2015    Lab Results  Component Value Date   PSA 2.21 05/27/2014   Wt Readings from Last 10 Encounters:  05/13/15 184 lb 12.8 oz (83.825 kg)  04/28/15 187 lb (84.823 kg)  04/16/15 187 lb 7 oz (85.021 kg)  04/10/15 189 lb (85.73 kg)  02/04/15 189 lb (85.73 kg)  11/06/14 196 lb (88.905 kg)  09/23/14 207 lb (93.895 kg)  09/18/14 207 lb (93.895 kg)  05/27/14 206 lb (93.441 kg)  02/12/14 208 lb (94.348 kg)     Blood pressure 126/78, pulse 64, temperature 98.7 F (37.1 C), resp. rate 16, height 6' 1.5" (1.867 m), weight 184 lb 12.8 oz (83.825 kg).  Past Medical History  Diagnosis Date  . Seasonal allergies     takes Claritin daily as needed and uses Flonase daily as needed  . Hyperlipidemia     takes Atorvastatin daily  . BPH (benign prostatic hyperplasia)     takes Flomax daily  . Hypogonadism male   . Foot drop     both feet  . Numbness     only in 4th and 5th finger on left hand  and left leg  . Chronic back pain     DDD  . Diverticulosis   . Urinary frequency     takes Flomax daily  . Nocturia   . Anxiety     takes Valium daily as needed  . GERD (gastroesophageal reflux disease)     takes Omeprazole daily as well as Zantac  . Arthritis     in neck   Family History  Problem Relation Age of Onset  . Colon cancer Neg Hx   . Stomach cancer Neg Hx   . Leukemia Mother   . Diabetes Mother   . Hypertension Father   . Diabetes Father     Current Outpatient Prescriptions on File Prior to Visit  Medication Sig Dispense Refill  . atorvastatin (LIPITOR) 40 MG tablet Take 40 mg by mouth 3 (three) times a week. Tuesday, Thursday, Saturday    . Cholecalciferol (VITAMIN D) 2000 UNITS tablet Take 4,000 Units by mouth daily.     . Cyanocobalamin (VITAMIN B-12 PO) Take 1 tablet by mouth daily.    . Flaxseed, Linseed, (FLAX SEED OIL) 1000 MG CAPS Take 1 capsule (1,000 mg total) by mouth 2 (two)  times daily.  0  . loratadine (CLARITIN) 10 MG tablet Take 10 mg by mouth as needed for allergies.     . Magnesium 250 MG TABS Take 250 mg by mouth daily.    Marland Kitchen omeprazole (PRILOSEC) 20 MG capsule Take 1 capsule (20 mg total) by mouth 2 (two) times daily before a meal. (Patient not taking: Reported on 04/10/2015) 180 capsule 6  . oxyCODONE 20 MG TABS Take 1 tablet (20 mg total) by mouth every 4 (four) hours as needed for severe pain. 50 tablet 0  . ranitidine (ZANTAC) 300 MG tablet Take one tablet twice daily. 180 tablet 1  . tamsulosin (FLOMAX) 0.4 MG CAPS capsule TAKE ONE CAPSULE BY MOUTH EVERY DAY (Patient not taking: Reported on 04/10/2015) 90 capsule 1  . testosterone cypionate (DEPOTESTOTERONE CYPIONATE) 200 MG/ML injection INJECT 2 MLS INTRAMUSCULARLY EVERY 2 WEEKS 10 mL 3   No current facility-administered medications on file prior to visit.    Review of Systems  Constitutional: Positive for activity change, appetite change, fatigue and unexpected weight change. Negative for  fever, chills and diaphoresis.  HENT: Negative for sore throat, trouble swallowing and voice change.   Respiratory: Negative for cough and shortness of breath.   Cardiovascular: Negative for chest pain.  Gastrointestinal: Positive for constipation. Negative for nausea, vomiting, abdominal pain, diarrhea, blood in stool, abdominal distention, anal bleeding and rectal pain.  Genitourinary: Positive for frequency and difficulty urinating. Negative for hematuria, flank pain, scrotal swelling and testicular pain.  Musculoskeletal: Positive for back pain. Negative for myalgias, joint swelling, arthralgias, gait problem, neck pain and neck stiffness.  Skin: Negative for rash.  Neurological: Negative for dizziness.  Hematological: Does not bruise/bleed easily.      Objective:   Physical Exam  Constitutional: He is oriented to person, place, and time. He appears well-developed.  malnourished  HENT:  Head: Normocephalic.  Eyes: Conjunctivae are normal. Pupils are equal, round, and reactive to light.  Neck: Normal range of motion. Neck supple. No thyromegaly present.  Cardiovascular: Normal rate and regular rhythm.   Pulmonary/Chest: Effort normal and breath sounds normal. He has no wheezes.  Abdominal: Soft. Bowel sounds are normal. He exhibits distension. He exhibits no mass. There is tenderness (RLQ). There is no rebound and no guarding.  Musculoskeletal: Normal range of motion.  Walks with cane, discomfort with change in position  Lymphadenopathy:    He has no cervical adenopathy.  Neurological: He is alert and oriented to person, place, and time. No cranial nerve deficit.  Skin: Skin is warm and dry.  Right middle back with irreg border, multi color nevus Well healing vertical scar on back, no erythema, swelling, warmth, discharge       Assessment & Plan:  Unintentional weight loss Can be due to depression, patient declines medication for depression at this time   rule out carcinoma,  infection, endocrine, etc.  Get PSA, BMP, LFT, TSH, CBC,  Urine, ESR, HIV, hep C Will send hemoccult card Get CXR  Removal abnormal mole right lower back  Constipation- will give movantik samples for opioid constipation.    Over 65 minutes of exam, counseling, chart review and high complex critical decision making was performed  Addendum: Thrombocytosis is elevated platelets and can come from many thing and is nonspecific.  The alk phos comes from either your liver/gallbladder or bone.  We are going to get a blood test called differentiated alk phos, as well as get a AB [place] and bone scan on you.  Continue boost.  Please still get CXR, send back hemoccult card, and schedule for mole removal.

## 2015-05-13 NOTE — Patient Instructions (Signed)

## 2015-05-14 LAB — URINALYSIS, ROUTINE W REFLEX MICROSCOPIC
BILIRUBIN URINE: NEGATIVE
Glucose, UA: NEGATIVE
HGB URINE DIPSTICK: NEGATIVE
Ketones, ur: NEGATIVE
LEUKOCYTES UA: NEGATIVE
Nitrite: NEGATIVE
PROTEIN: NEGATIVE
Specific Gravity, Urine: 1.025 (ref 1.001–1.035)
pH: 7 (ref 5.0–8.0)

## 2015-05-14 LAB — TSH: TSH: 1.671 u[IU]/mL (ref 0.350–4.500)

## 2015-05-14 LAB — MICROALBUMIN / CREATININE URINE RATIO
Creatinine, Urine: 312.3 mg/dL
MICROALB UR: 0.6 mg/dL (ref <2.0)
Microalb Creat Ratio: 1.9 mg/g (ref 0.0–30.0)

## 2015-05-14 LAB — PSA: PSA: 2.53 ng/mL (ref <=4.00)

## 2015-05-14 LAB — HIV ANTIBODY (ROUTINE TESTING W REFLEX): HIV 1&2 Ab, 4th Generation: NONREACTIVE

## 2015-05-14 LAB — SEDIMENTATION RATE: SED RATE: 17 mm/h (ref 0–20)

## 2015-05-14 LAB — HEPATITIS C ANTIBODY: HCV Ab: NEGATIVE

## 2015-05-14 NOTE — Addendum Note (Signed)
Addended by: Vicie Mutters R on: 05/14/2015 08:51 AM   Modules accepted: Orders

## 2015-05-15 ENCOUNTER — Encounter: Payer: Self-pay | Admitting: Internal Medicine

## 2015-05-19 ENCOUNTER — Ambulatory Visit (HOSPITAL_COMMUNITY)
Admission: RE | Admit: 2015-05-19 | Discharge: 2015-05-19 | Disposition: A | Discharge status: Home / Self Care | Payer: 59 | Source: Ambulatory Visit | Attending: Physician Assistant | Admitting: Physician Assistant

## 2015-05-19 DIAGNOSIS — R634 Abnormal weight loss: Secondary | ICD-10-CM | POA: Diagnosis not present

## 2015-05-19 DIAGNOSIS — Z87891 Personal history of nicotine dependence: Secondary | ICD-10-CM | POA: Insufficient documentation

## 2015-05-20 ENCOUNTER — Ambulatory Visit (HOSPITAL_COMMUNITY)
Admission: RE | Admit: 2015-05-20 | Discharge: 2015-05-20 | Disposition: A | Discharge status: Home / Self Care | Payer: 59 | Source: Ambulatory Visit | Attending: Physician Assistant | Admitting: Physician Assistant

## 2015-05-20 ENCOUNTER — Other Ambulatory Visit: Payer: Self-pay | Admitting: Physician Assistant

## 2015-05-20 DIAGNOSIS — R748 Abnormal levels of other serum enzymes: Secondary | ICD-10-CM | POA: Diagnosis present

## 2015-05-20 DIAGNOSIS — N1330 Unspecified hydronephrosis: Secondary | ICD-10-CM

## 2015-05-20 DIAGNOSIS — R634 Abnormal weight loss: Secondary | ICD-10-CM | POA: Diagnosis not present

## 2015-05-20 DIAGNOSIS — D473 Essential (hemorrhagic) thrombocythemia: Secondary | ICD-10-CM | POA: Diagnosis not present

## 2015-05-20 DIAGNOSIS — D75839 Thrombocytosis, unspecified: Secondary | ICD-10-CM

## 2015-05-26 ENCOUNTER — Other Ambulatory Visit: Payer: 59

## 2015-05-26 ENCOUNTER — Inpatient Hospital Stay: Admission: RE | Admit: 2015-05-26 | Payer: 59 | Source: Ambulatory Visit

## 2015-05-26 ENCOUNTER — Ambulatory Visit
Admission: RE | Admit: 2015-05-26 | Discharge: 2015-05-26 | Disposition: A | Discharge status: Home / Self Care | Payer: 59 | Source: Ambulatory Visit | Attending: Physician Assistant | Admitting: Physician Assistant

## 2015-05-26 DIAGNOSIS — D75839 Thrombocytosis, unspecified: Secondary | ICD-10-CM

## 2015-05-26 DIAGNOSIS — R748 Abnormal levels of other serum enzymes: Secondary | ICD-10-CM

## 2015-05-26 DIAGNOSIS — N1330 Unspecified hydronephrosis: Secondary | ICD-10-CM

## 2015-05-26 DIAGNOSIS — R634 Abnormal weight loss: Secondary | ICD-10-CM

## 2015-05-26 DIAGNOSIS — D473 Essential (hemorrhagic) thrombocythemia: Secondary | ICD-10-CM

## 2015-05-26 MED ORDER — IOPAMIDOL (ISOVUE-300) INJECTION 61%
100.0000 mL | Freq: Once | INTRAVENOUS | Status: AC | PRN
  Administered 2015-05-26: 100 mL via INTRAVENOUS

## 2015-05-28 ENCOUNTER — Encounter: Payer: Self-pay | Admitting: Emergency Medicine

## 2015-05-28 ENCOUNTER — Encounter: Payer: Self-pay | Admitting: Internal Medicine

## 2015-05-28 ENCOUNTER — Ambulatory Visit (INDEPENDENT_AMBULATORY_CARE_PROVIDER_SITE_OTHER): Payer: 59 | Admitting: Internal Medicine

## 2015-05-28 VITALS — BP 118/70 | HR 68 | Temp 98.0°F | Resp 18 | Ht 73.5 in | Wt 185.0 lb

## 2015-05-28 DIAGNOSIS — Z0000 Encounter for general adult medical examination without abnormal findings: Secondary | ICD-10-CM

## 2015-05-28 DIAGNOSIS — I10 Essential (primary) hypertension: Secondary | ICD-10-CM

## 2015-05-28 DIAGNOSIS — K5900 Constipation, unspecified: Secondary | ICD-10-CM

## 2015-05-28 DIAGNOSIS — Z130 Encounter for screening for diseases of the blood and blood-forming organs and certain disorders involving the immune mechanism: Secondary | ICD-10-CM

## 2015-05-28 DIAGNOSIS — E559 Vitamin D deficiency, unspecified: Secondary | ICD-10-CM

## 2015-05-28 DIAGNOSIS — Z125 Encounter for screening for malignant neoplasm of prostate: Secondary | ICD-10-CM

## 2015-05-28 DIAGNOSIS — R7303 Prediabetes: Secondary | ICD-10-CM

## 2015-05-28 DIAGNOSIS — Z79899 Other long term (current) drug therapy: Secondary | ICD-10-CM

## 2015-05-28 DIAGNOSIS — E291 Testicular hypofunction: Secondary | ICD-10-CM

## 2015-05-28 DIAGNOSIS — Z1212 Encounter for screening for malignant neoplasm of rectum: Secondary | ICD-10-CM

## 2015-05-28 DIAGNOSIS — E782 Mixed hyperlipidemia: Secondary | ICD-10-CM

## 2015-05-28 DIAGNOSIS — R911 Solitary pulmonary nodule: Secondary | ICD-10-CM

## 2015-05-28 DIAGNOSIS — Z0001 Encounter for general adult medical examination with abnormal findings: Secondary | ICD-10-CM

## 2015-05-28 LAB — BASIC METABOLIC PANEL WITH GFR
BUN: 11 mg/dL (ref 7–25)
CALCIUM: 9.5 mg/dL (ref 8.6–10.3)
CO2: 29 mmol/L (ref 20–31)
Chloride: 103 mmol/L (ref 98–110)
Creat: 0.98 mg/dL (ref 0.70–1.25)
GFR, EST NON AFRICAN AMERICAN: 81 mL/min (ref >=60)
Glucose, Bld: 87 mg/dL (ref 65–99)
Potassium: 4.4 mmol/L (ref 3.5–5.3)
SODIUM: 140 mmol/L (ref 135–146)

## 2015-05-28 LAB — CBC WITH DIFFERENTIAL/PLATELET
BASOS PCT: 0 % (ref 0–1)
Basophils Absolute: 0 10*3/uL (ref 0.0–0.1)
Eosinophils Absolute: 0.1 10*3/uL (ref 0.0–0.7)
Eosinophils Relative: 2 % (ref 0–5)
HCT: 43.8 % (ref 39.0–52.0)
HEMOGLOBIN: 15 g/dL (ref 13.0–17.0)
Lymphocytes Relative: 21 % (ref 12–46)
Lymphs Abs: 1.1 10*3/uL (ref 0.7–4.0)
MCH: 33.3 pg (ref 26.0–34.0)
MCHC: 34.2 g/dL (ref 30.0–36.0)
MCV: 97.1 fL (ref 78.0–100.0)
MONO ABS: 0.5 10*3/uL (ref 0.1–1.0)
MPV: 9.8 fL (ref 8.6–12.4)
Monocytes Relative: 10 % (ref 3–12)
NEUTROS ABS: 3.4 10*3/uL (ref 1.7–7.7)
NEUTROS PCT: 67 % (ref 43–77)
Platelets: 246 10*3/uL (ref 150–400)
RBC: 4.51 MIL/uL (ref 4.22–5.81)
RDW: 14.5 % (ref 11.5–15.5)
WBC: 5 10*3/uL (ref 4.0–10.5)

## 2015-05-28 LAB — LIPID PANEL
CHOL/HDL RATIO: 3.2 ratio (ref <=5.0)
CHOLESTEROL: 133 mg/dL (ref 125–200)
HDL: 41 mg/dL (ref >=40)
LDL Cholesterol: 68 mg/dL (ref <130)
TRIGLYCERIDES: 120 mg/dL (ref <150)
VLDL: 24 mg/dL (ref <30)

## 2015-05-28 LAB — HEPATIC FUNCTION PANEL
ALT: 16 U/L (ref 9–46)
AST: 16 U/L (ref 10–35)
Albumin: 3.8 g/dL (ref 3.6–5.1)
Alkaline Phosphatase: 122 U/L — ABNORMAL HIGH (ref 40–115)
BILIRUBIN DIRECT: 0.1 mg/dL (ref <=0.2)
BILIRUBIN INDIRECT: 0.4 mg/dL (ref 0.2–1.2)
Total Bilirubin: 0.5 mg/dL (ref 0.2–1.2)
Total Protein: 6.8 g/dL (ref 6.1–8.1)

## 2015-05-28 LAB — IRON AND TIBC
%SAT: 30 % (ref 15–60)
Iron: 82 ug/dL (ref 50–180)
TIBC: 274 ug/dL (ref 250–425)
UIBC: 192 ug/dL (ref 125–400)

## 2015-05-28 LAB — MAGNESIUM: Magnesium: 2 mg/dL (ref 1.5–2.5)

## 2015-05-28 MED ORDER — LUBIPROSTONE 24 MCG PO CAPS: 24.0000 ug | ORAL_CAPSULE | Freq: Every day | ORAL | Status: DC

## 2015-05-28 NOTE — Patient Instructions (Signed)

## 2015-05-28 NOTE — Progress Notes (Signed)
Patient ID: Jacob Larsen, male   DOB: [birth year], 64 y.o.   MRN: [id number]  Complete Physical  Assessment and Plan:   1. Encounter for general adult medical examination with abnormal findings   2. Essential hypertension  - Urinalysis, Routine w reflex microscopic (not at [facility]) - Microalbumin / creatinine urine ratio - EKG 12-Lead - [place], RETROPERITNL ABD,  LTD - TSH  3. Prediabetes  - Hemoglobin A1c - Insulin, random  4. Hypogonadism in male -viagra samples given - Testosterone  5. Mixed hyperlipidemia  - Lipid panel  6. Vitamin D deficiency -cont supplement - Vit D  25 hydroxy (rtn osteoporosis monitoring)  7. Medication management  - CBC with Differential/Platelet - BASIC METABOLIC PANEL WITH GFR - Hepatic function panel - Magnesium  8. Screening for prostate cancer  - PSA  9. Screening for rectal cancer  - POC Hemoccult Bld/Stl (3-Cd Home Screen); Future  10. Screening, deficiency anemia, iron  - Iron and TIBC - Vitamin B12  11. Constipation, unspecified constipation type -amitiza  12.  Pulmonary Nodule -follow with CT scan in 6 months -consider sending to pulmonology    Discussed med's effects and SE's. Screening labs and tests as requested with regular follow-up as recommended.  HPI Patient presents for a complete physical.   His blood pressure has been controlled at home, today their BP is BP: 118/70 mmHg He does not workout. He denies chest pain, shortness of breath, dizziness.   He is on cholesterol medication and denies myalgias. His cholesterol is at goal. The cholesterol last visit was:   Lab Results  Component Value Date   CHOL 153 02/04/2015   HDL 44 02/04/2015   LDLCALC 87 02/04/2015   TRIG 112 02/04/2015   CHOLHDL 3.5 02/04/2015    He has been working on diet and exercise for prediabetes, he is on bASA, he is not on ACE/ARB and denies foot ulcerations, hyperglycemia, hypoglycemia , increased appetite, nausea, paresthesia  of the feet, polydipsia, polyuria, visual disturbances, vomiting and weight loss. Last A1C in the office was:  Lab Results  Component Value Date   HGBA1C 5.7* 02/04/2015    Patient is on Vitamin D supplement.   Lab Results  Component Value Date   VD25OH 66 02/04/2015      Last PSA was: Lab Results  Component Value Date   PSA 2.53 05/13/2015  .  Denies BPH symptoms daytime frequency, dysuria, hematuria, incontinence or intermittency.  Patient reports that he recently had back surgery in August of last month.  He reports that he has been taking pain medications, but he hasn't been having good BM.  He has had two back surgeries in the past 6 months.  Patient reports that he feels as though he is gaining back some of his weight.  CT scan done last week reveals pulmonary nodule which is 6 mm x 4 mm.   He is a former smoker for 4 years with less than a pack per week.    Current Medications:  Current Outpatient Prescriptions on File Prior to Visit  Medication Sig Dispense Refill  . atorvastatin (LIPITOR) 40 MG tablet Take 40 mg by mouth 3 (three) times a week. Tuesday, Thursday, Saturday    . Cholecalciferol (VITAMIN D) 2000 UNITS tablet Take 4,000 Units by mouth daily.     . Cyanocobalamin (VITAMIN B-12 PO) Take 1 tablet by mouth daily.    . Flaxseed, Linseed, (FLAX SEED OIL) 1000 MG CAPS Take 1 capsule (1,000 mg total) by  mouth 2 (two) times daily.  0  . loratadine (CLARITIN) 10 MG tablet Take 10 mg by mouth as needed for allergies.     . Magnesium 250 MG TABS Take 250 mg by mouth daily.    . naloxegol oxalate (MOVANTIK) 25 MG TABS tablet Take 1 tablet (25 mg total) by mouth daily. 30 tablet 3  . oxyCODONE 20 MG TABS Take 1 tablet (20 mg total) by mouth every 4 (four) hours as needed for severe pain. 50 tablet 0  . ranitidine (ZANTAC) 300 MG tablet Take one tablet twice daily. 180 tablet 1  . tamsulosin (FLOMAX) 0.4 MG CAPS capsule TAKE ONE CAPSULE BY MOUTH EVERY DAY 90 capsule 1  .  testosterone cypionate (DEPOTESTOTERONE CYPIONATE) 200 MG/ML injection INJECT 2 MLS INTRAMUSCULARLY EVERY 2 WEEKS 10 mL 3   No current facility-administered medications on file prior to visit.    Health Maintenance:  Immunization History  Administered Date(s) Administered  . Influenza-Unspecified 08/17/2014  . PPD Test 05/27/2014  . Pneumococcal Polysaccharide-23 04/08/2013  . Td 09/13/2003  . Tdap 05/27/2014    Patient Care Team: Unk Pinto, MD as PCP - General (Internal Medicine) Inda Castle, MD as Consulting Physician (Gastroenterology) Kathie Rhodes, MD as Consulting Physician (Urology) Leeroy Cha, MD as Consulting Physician (Neurosurgery) Barbaraann Cao, OD as Referring Physician (Optometry) Druscilla Brownie, MD as Consulting Physician (Dermatology)  Allergies:  Allergies  Allergen Reactions  . Salicylates Rash  . Aspirin     REACTION: rash  . Other     Beef-hives    Medical History:  Past Medical History  Diagnosis Date  . Seasonal allergies     takes Claritin daily as needed and uses Flonase daily as needed  . Hyperlipidemia     takes Atorvastatin daily  . BPH (benign prostatic hyperplasia)     takes Flomax daily  . Hypogonadism male   . Foot drop     both feet  . Numbness     only in 4th and 5th finger on left hand and left leg  . Chronic back pain     DDD  . Diverticulosis   . Urinary frequency     takes Flomax daily  . Nocturia   . Anxiety     takes Valium daily as needed  . GERD (gastroesophageal reflux disease)     takes Omeprazole daily as well as Zantac  . Arthritis     in neck    Surgical History:  Past Surgical History  Procedure Laterality Date  . Cervical fusion  2011/2014  . Knee arthroscopy      right 1985; left 1992  . Septoplasty    . Shoulder arthroscopy Left 2015  . Shoulder surgery Right 08  . Colonoscopy    . Back surgery      lumbar fusion    Family History:  Family History  Problem Relation Age  of Onset  . Colon cancer Neg Hx   . Stomach cancer Neg Hx   . Leukemia Mother   . Diabetes Mother   . Hypertension Father   . Diabetes Father     Social History:   Social History  Substance Use Topics  . Smoking status: Former Smoker    Quit date: 05/28/1995  . Smokeless tobacco: None     Comment: quit smoking >20+yrs ago  . Alcohol Use: 0.0 oz/week    0 Standard drinks or equivalent per week     Comment: rarely    Review of  Systems:  Review of Systems  Constitutional: Positive for weight loss. Negative for fever, chills and malaise/fatigue.  HENT: Negative for congestion, ear pain, nosebleeds and sore throat.   Respiratory: Negative for cough, shortness of breath and wheezing.   Cardiovascular: Negative for chest pain, palpitations and leg swelling.  Gastrointestinal: Positive for constipation. Negative for heartburn, diarrhea, blood in stool and melena.  Genitourinary: Positive for frequency. Negative for dysuria, urgency and hematuria.  Musculoskeletal: Positive for back pain.  Skin: Negative.   Neurological: Positive for sensory change (chronic from low back pain). Negative for dizziness, loss of consciousness and headaches.  Psychiatric/Behavioral: Negative for depression. The patient is not nervous/anxious and does not have insomnia.     Physical Exam: Estimated body mass index is 24.07 kg/(m^2) as calculated from the following:   Height as of this encounter: 6' 1.5" (1.867 m).   Weight as of this encounter: 185 lb (83.915 kg). BP 118/70 mmHg  Temp(Src) 98 F (36.7 C) (Temporal)  Resp 18  Ht 6' 1.5" (1.867 m)  Wt 185 lb (83.915 kg)  BMI 24.07 kg/m2     General Appearance: Well nourished, in no apparent distress.  Eyes: PERRLA, EOMs, conjunctiva no swelling or erythema ENT/Mouth: Ear canals clear bilaterally with no erythema, swelling, discharge.  TMs normal bilaterally with no erythema, bulging, or retractions.  Oropharynx clear and moist with no exudate,  swelling, or erythema.  Dentition normal.   Neck: Supple, thyroid normal. No bruits, JVD, cervical adenopathy Respiratory: Respiratory effort normal, BS equal bilaterally without rales, rhonchi, wheezing or stridor.  Cardio: RRR without murmurs, rubs or gallops. Brisk peripheral pulses without edema.  Chest: symmetric, with normal excursions Abdomen: Soft, nontender, no guarding, rebound, hernias, masses, or organomegaly. Genitourinary: No inguinal hernias, normal prostate, no nodules, tenderness, or irregularities palpated on prostate. Musculoskeletal: Full ROM all peripheral extremities,5/5 strength, and normal gait. Well healed midline surgical scar of the lumbar spine.  Limited spinal ROM Skin: Warm, dry without rashes, lesions, ecchymosis. Neuro: A&Ox3, Cranial nerves intact, reflexes equal bilaterally. Normal muscle tone, no cerebellar symptoms. Sensation intact.  Psych: Normal affect, Insight and Judgment appropriate.   EKG: WNL no changes.  Over 40 minutes of exam, counseling, chart review and critical decision making was performed  Starlyn Skeans 9:24 AM [facility]

## 2015-05-29 LAB — URINALYSIS, ROUTINE W REFLEX MICROSCOPIC
BILIRUBIN URINE: NEGATIVE
GLUCOSE, UA: NEGATIVE
Hgb urine dipstick: NEGATIVE
Ketones, ur: NEGATIVE
LEUKOCYTES UA: NEGATIVE
Nitrite: NEGATIVE
Protein, ur: NEGATIVE
SPECIFIC GRAVITY, URINE: 1.015 (ref 1.001–1.035)
pH: 6.5 (ref 5.0–8.0)

## 2015-05-29 LAB — VITAMIN D 25 HYDROXY (VIT D DEFICIENCY, FRACTURES): VIT D 25 HYDROXY: 55 ng/mL (ref 30–100)

## 2015-05-29 LAB — TESTOSTERONE: Testosterone: 971 ng/dL — ABNORMAL HIGH (ref 300–890)

## 2015-05-29 LAB — VITAMIN B12: Vitamin B-12: 965 pg/mL — ABNORMAL HIGH (ref 211–911)

## 2015-05-29 LAB — TSH: TSH: 3.475 u[IU]/mL (ref 0.350–4.500)

## 2015-05-29 LAB — MICROALBUMIN / CREATININE URINE RATIO: Creatinine, Urine: 132.6 mg/dL

## 2015-05-29 LAB — HEMOGLOBIN A1C
Hgb A1c MFr Bld: 5.4 % (ref <5.7)
Mean Plasma Glucose: 108 mg/dL (ref <117)

## 2015-05-29 LAB — PSA: PSA: 2.31 ng/mL (ref <=4.00)

## 2015-05-29 LAB — INSULIN, RANDOM: Insulin: 8.5 u[IU]/mL (ref 2.0–19.6)

## 2015-06-03 ENCOUNTER — Other Ambulatory Visit: Payer: Self-pay | Admitting: *Deleted

## 2015-06-03 DIAGNOSIS — Z1211 Encounter for screening for malignant neoplasm of colon: Secondary | ICD-10-CM

## 2015-06-03 LAB — POC HEMOCCULT BLD/STL (HOME/3-CARD/SCREEN)
FECAL OCCULT BLD: NEGATIVE
FECAL OCCULT BLD: NEGATIVE
FECAL OCCULT BLD: NEGATIVE

## 2015-06-18 ENCOUNTER — Encounter: Payer: Self-pay | Admitting: Physician Assistant

## 2015-06-18 ENCOUNTER — Ambulatory Visit (INDEPENDENT_AMBULATORY_CARE_PROVIDER_SITE_OTHER): Payer: 59 | Admitting: Physician Assistant

## 2015-06-18 VITALS — BP 140/70 | HR 86 | Temp 98.1°F | Resp 14 | Ht 73.5 in | Wt 192.0 lb

## 2015-06-18 DIAGNOSIS — D235 Other benign neoplasm of skin of trunk: Secondary | ICD-10-CM

## 2015-06-18 DIAGNOSIS — Z23 Encounter for immunization: Secondary | ICD-10-CM | POA: Diagnosis not present

## 2015-06-18 DIAGNOSIS — I10 Essential (primary) hypertension: Secondary | ICD-10-CM | POA: Diagnosis not present

## 2015-06-18 DIAGNOSIS — D225 Melanocytic nevi of trunk: Secondary | ICD-10-CM

## 2015-06-18 NOTE — Progress Notes (Signed)
Chief Complaint: Patient presents for evaluation of a skin lesion.  Wt Readings from Last 3 Encounters:  06/18/15 192 lb (87.091 kg)  05/28/15 185 lb (83.915 kg)  05/13/15 184 lb 12.8 oz (83.825 kg)   Blood pressure 140/70, pulse 86, temperature 98.1 F (36.7 C), temperature source Temporal, resp. rate 14, height 6' 1.5" (1.867 m), weight 192 lb (87.091 kg), SpO2 99 %.   Exam: Left middle back with irreg border, multi color nevus  Anesthesia: Lidocaine 1% without epinephrine   Procedure Details   The risks, benefits, indications, potential complications, and alternatives were explained to the patient and informed consent obtained.  ELECTRO: The lesion and surrounding area was given sterile prep using alcohol and draped in the usual sterile fashion. A 11 blade was used to excise an elliptical area of skin approximately 1cm by 1cm. The wound was closed with electrocaudry. Antibiotic ointment and a sterile dressing applied. The specimen was sent for pathologic examination. The patient tolerated the procedure well with minimal blood loss.   Condition: Stable  Complications:  None  Diagnosis:  Hypertension Atypical nevus, unspecified D 22.9  Procedure code: 32671 one lesion  Plan: 1. Instructed to keep the wound dry and covered for 24-48 hours and clean thereafter. 2. Warning signs of infection were reviewed.    3. Recommended that the patient use OTC acetaminophen as needed for pain.   4. Monitor BP at home.

## 2015-06-18 NOTE — Patient Instructions (Signed)
Mole Excision Moleexcision is a procedure to remove (excise) a mole from your skin. Most moles are noncancerous (benign) and they do not require treatment. But some moles are larger than usual or resemble cancerous moles (atypical moles).  You may have a mole excision if your health care provider wants to remove an atypical mole and test it in a laboratory to determine whether it is cancerous (biopsy). You may also have a mole excision if a mole causes pain or you would like it removed due to its appearance. LET [facility] CARE PROVIDER KNOW ABOUT:  Any allergies you have.  All medicines you are taking, including vitamins, herbs, eye drops, creams, and over-the-counter medicines.  Previous problems you or members of your family have had with the use of anesthetics.  Previous problems you have had with pain medicines.  Any blood disorders you have.  Previous surgeries you have had.  Medical conditions you have. RISKS AND COMPLICATIONS Generally, this is a safe procedure. However, problems may occur, including:  Excessive bleeding.  Infection.  Scarring. BEFORE THE PROCEDURE  Ask your health care provider about:  Changing or stopping your regular medicines. This is especially important if you are taking diabetes medicines or blood thinners.  Taking medicines such as aspirin and ibuprofen. These medicines can thin your blood. Do not take these medicines before your procedure if your health care provider instructs you not to.  Follow your health care provider's instructions about eating or drinking restrictions.  Ask your health care provider if you should plan to have someone take you home after the procedure. PROCEDURE  An IV tube will be inserted in a vein.  You will be given one of the following:  A medicine that numbs the area (local anesthetic).  A medicine that makes you fall asleep (general anesthetic).  Your health care provider will outline the mole with ink and  mark the center with a dot. This serves as a guide during the procedure.  Depending on the size of your mole, your health care provider will remove it using:  A surgical blade. This is used to remove small moles.  A hollow tube with a sharp end (punch device). This may be used for larger moles.  Your health care provider will use stitches (sutures) to close the wound in the skin where the mole was removed (excision site). The procedure may vary among health care providers and hospitals. AFTER THE PROCEDURE  Your blood pressure, heart rate, breathing rate, and blood oxygen level may be monitored often until the medicines you were given have worn off.   This information is not intended to replace advice given to you by your health care provider. Make sure you discuss any questions you have with your health care provider.   Document Released: 08/26/2000 Document Revised: 09/19/2014 Document Reviewed: 04/30/2014 Elsevier Interactive Patient Education [employer].

## 2015-06-22 ENCOUNTER — Other Ambulatory Visit: Payer: Self-pay | Admitting: *Deleted

## 2015-07-20 ENCOUNTER — Other Ambulatory Visit: Payer: Self-pay | Admitting: Physician Assistant

## 2015-07-21 ENCOUNTER — Other Ambulatory Visit: Payer: Self-pay | Admitting: Internal Medicine

## 2015-07-22 NOTE — Telephone Encounter (Signed)
Rx called in 

## 2015-07-27 ENCOUNTER — Other Ambulatory Visit: Payer: Self-pay | Admitting: Internal Medicine

## 2015-08-02 ENCOUNTER — Other Ambulatory Visit: Payer: Self-pay | Admitting: Internal Medicine

## 2015-09-09 ENCOUNTER — Ambulatory Visit (INDEPENDENT_AMBULATORY_CARE_PROVIDER_SITE_OTHER): Payer: Medicare Other | Admitting: Internal Medicine

## 2015-09-09 ENCOUNTER — Encounter: Payer: Self-pay | Admitting: Internal Medicine

## 2015-09-09 VITALS — BP 126/84 | HR 104 | Temp 97.7°F | Resp 16 | Ht 73.5 in | Wt 200.4 lb

## 2015-09-09 DIAGNOSIS — E782 Mixed hyperlipidemia: Secondary | ICD-10-CM

## 2015-09-09 DIAGNOSIS — R7309 Other abnormal glucose: Secondary | ICD-10-CM | POA: Diagnosis not present

## 2015-09-09 DIAGNOSIS — E291 Testicular hypofunction: Secondary | ICD-10-CM | POA: Diagnosis not present

## 2015-09-09 DIAGNOSIS — I10 Essential (primary) hypertension: Secondary | ICD-10-CM | POA: Diagnosis not present

## 2015-09-09 DIAGNOSIS — M5136 Other intervertebral disc degeneration, lumbar region: Secondary | ICD-10-CM

## 2015-09-09 DIAGNOSIS — R7303 Prediabetes: Secondary | ICD-10-CM | POA: Diagnosis not present

## 2015-09-09 DIAGNOSIS — Z79899 Other long term (current) drug therapy: Secondary | ICD-10-CM | POA: Diagnosis not present

## 2015-09-09 DIAGNOSIS — E559 Vitamin D deficiency, unspecified: Secondary | ICD-10-CM

## 2015-09-09 DIAGNOSIS — N320 Bladder-neck obstruction: Secondary | ICD-10-CM | POA: Diagnosis not present

## 2015-09-09 DIAGNOSIS — E349 Endocrine disorder, unspecified: Secondary | ICD-10-CM

## 2015-09-09 LAB — TSH: TSH: 1.661 u[IU]/mL (ref 0.350–4.500)

## 2015-09-09 LAB — LIPID PANEL
Cholesterol: 134 mg/dL (ref 125–200)
HDL: 42 mg/dL (ref >=40)
LDL CALC: 63 mg/dL (ref <130)
TRIGLYCERIDES: 147 mg/dL (ref <150)
Total CHOL/HDL Ratio: 3.2 Ratio (ref <=5.0)
VLDL: 29 mg/dL (ref <30)

## 2015-09-09 LAB — CBC WITH DIFFERENTIAL/PLATELET
BASOS ABS: 0 10*3/uL (ref 0.0–0.1)
BASOS PCT: 0 % (ref 0–1)
Eosinophils Absolute: 0.2 10*3/uL (ref 0.0–0.7)
Eosinophils Relative: 4 % (ref 0–5)
HCT: 45.3 % (ref 39.0–52.0)
HEMOGLOBIN: 15.5 g/dL (ref 13.0–17.0)
Lymphocytes Relative: 30 % (ref 12–46)
Lymphs Abs: 1.2 10*3/uL (ref 0.7–4.0)
MCH: 32.3 pg (ref 26.0–34.0)
MCHC: 34.2 g/dL (ref 30.0–36.0)
MCV: 94.4 fL (ref 78.0–100.0)
MONO ABS: 0.5 10*3/uL (ref 0.1–1.0)
MPV: 10 fL (ref 8.6–12.4)
Monocytes Relative: 11 % (ref 3–12)
NEUTROS ABS: 2.3 10*3/uL (ref 1.7–7.7)
NEUTROS PCT: 55 % (ref 43–77)
Platelets: 217 10*3/uL (ref 150–400)
RBC: 4.8 MIL/uL (ref 4.22–5.81)
RDW: 14.6 % (ref 11.5–15.5)
WBC: 4.1 10*3/uL (ref 4.0–10.5)

## 2015-09-09 LAB — BASIC METABOLIC PANEL WITH GFR
BUN: 12 mg/dL (ref 7–25)
CALCIUM: 9.4 mg/dL (ref 8.6–10.3)
CO2: 23 mmol/L (ref 20–31)
Chloride: 104 mmol/L (ref 98–110)
Creat: 0.98 mg/dL (ref 0.70–1.25)
GFR, Est Non African American: 81 mL/min (ref >=60)
GLUCOSE: 109 mg/dL — AB (ref 65–99)
POTASSIUM: 4.1 mmol/L (ref 3.5–5.3)
Sodium: 139 mmol/L (ref 135–146)

## 2015-09-09 LAB — HEPATIC FUNCTION PANEL
ALBUMIN: 3.9 g/dL (ref 3.6–5.1)
ALK PHOS: 75 U/L (ref 40–115)
ALT: 23 U/L (ref 9–46)
AST: 24 U/L (ref 10–35)
Bilirubin, Direct: 0.1 mg/dL (ref <=0.2)
Indirect Bilirubin: 0.3 mg/dL (ref 0.2–1.2)
TOTAL PROTEIN: 7 g/dL (ref 6.1–8.1)
Total Bilirubin: 0.4 mg/dL (ref 0.2–1.2)

## 2015-09-09 LAB — MAGNESIUM: Magnesium: 2 mg/dL (ref 1.5–2.5)

## 2015-09-09 MED ORDER — TAMSULOSIN HCL 0.4 MG PO CAPS: ORAL_CAPSULE | ORAL | Status: DC

## 2015-09-09 MED ORDER — FINASTERIDE 5 MG PO TABS: ORAL_TABLET | ORAL | Status: DC

## 2015-09-09 NOTE — Progress Notes (Signed)
Patient ID: Jacob Larsen, male   DOB: [birth year], 64 y.o.   MRN: [id number]   This very nice 64 y.o. MBM presents for  follow up with labile Hypertension, Hyperlipidemia, Pre-Diabetes, Testosterone  and Vitamin D Deficiency. Patient also has Chronic Pain syndrome due to Lumbar DDD. In Jan 2016, he had a fusion at L4-L5 and then in Aug 2016, he had a 2sd fusion at L3-L4 by Dr Joya Salm. He continues to have pain and is attempting to limit pain meds as able.    Patient has a hx/o labile HTN and is monitored expectantly & BP has been controlled at home. Today's BP: 126/84 mmHg. Patient has had no complaints of any cardiac type chest pain, palpitations, dyspnea/orthopnea/PND, dizziness, claudication, or dependent edema.   Hyperlipidemia is controlled with diet & meds. Patient denies myalgias or other med SE's. Last Lipids were at goal with Cholesterol 133; HDL 41; LDL 68; Triglycerides 120 on 05/28/2015.   Also, the patient has history of PreDiabetes and has had no symptoms of reactive hypoglycemia, diabetic polys, paresthesias or visual blurring.  Last A1c was 5.4% on  05/28/2015.   The patient also has Low T and is on replacement therapy by injection. Further, the patient also has history of Vitamin D Deficiency and supplements vitamin D without any suspected side-effects. Last vitamin D was 55 on 05/28/2015.  Medication Sig  . atorvastatin 40 MG tablet Take 40 mg by mouth 3 (three) times a week. Tuesday, Thursday, Saturday  . VITAMIN D 2000 UNITS Take 4,000 Units by mouth daily.   Marland Kitchen VITAMIN B-12 PO Take 1 tablet by mouth daily.  . diclofenac  75 MG EC tablet TAKE 1 TABLET BY MOUTH TWICE A DAY  . FLAX SEED OIL 1000 MG CAPS Take 1 capsule (1,000 mg total) by mouth 2 (two) times daily.  Marland Kitchen loratadine 10 MG tablet Take 10 mg by mouth as needed for allergies.   Marland Kitchen lubiprostone (AMITIZA) 24 MCG capsule Take 1 capsule (24 mcg total) by mouth daily with breakfast.  . Magnesium 250 MG TABS Take 250 mg by mouth  daily.  Marland Kitchen oxyCODONE 20 MG TABS Take 1 tablet (20 mg total) by mouth every 4 (four) hours as needed for severe pain.  . ranitidine  300 MG tablet TAKE 1 TABLET BY MOUTH TWICE A DAY  . tamsulosin  0.4 MG CAPS capsule TAKE ONE CAPSULE BY MOUTH DAILY  . DEPO-TESTOSTERONE 200 MG/ML inj INJECT 2 MLS IM EVERY 2 WEEKS   Allergies  Allergen Reactions  . Salicylates Rash  . Aspirin     REACTION: rash  . Other     Beef-hives   PMHx:   Past Medical History  Diagnosis Date  . Seasonal allergies     takes Claritin daily as needed and uses Flonase daily as needed  . Hyperlipidemia     takes Atorvastatin daily  . BPH (benign prostatic hyperplasia)     takes Flomax daily  . Hypogonadism male   . Foot drop     both feet  . Numbness     only in 4th and 5th finger on left hand and left leg  . Chronic back pain     DDD  . Diverticulosis   . Urinary frequency     takes Flomax daily  . Nocturia   . Anxiety     takes Valium daily as needed  . GERD (gastroesophageal reflux disease)     takes Omeprazole daily as well as Zantac  .  Arthritis     in neck   Immunization History  Administered Date(s) Administered  . Influenza Split 06/18/2015  . Influenza-Unspecified 08/17/2014  . PPD Test 05/27/2014  . Pneumococcal Polysaccharide-23 04/08/2013  . Td 09/13/2003  . Tdap 05/27/2014   Past Surgical History  Procedure Laterality Date  . Cervical fusion  2011/2014  . Knee arthroscopy      right 1985; left 1992  . Septoplasty    . Shoulder arthroscopy Left 2015  . Shoulder surgery Right 08  . Colonoscopy    . Back surgery      lumbar fusion   FHx:    Reviewed / unchanged  SHx:    Reviewed / unchanged  Systems Review:  Constitutional: Denies fever, chills, wt changes, headaches, insomnia, fatigue, night sweats, change in appetite. Eyes: Denies redness, blurred vision, diplopia, discharge, itchy, watery eyes.  ENT: Denies discharge, congestion, post nasal drip, epistaxis, sore throat,  earache, hearing loss, dental pain, tinnitus, vertigo, sinus pain, snoring.  CV: Denies chest pain, palpitations, irregular heartbeat, syncope, dyspnea, diaphoresis, orthopnea, PND, claudication or edema. Respiratory: denies cough, dyspnea, DOE, pleurisy, hoarseness, laryngitis, wheezing.  Gastrointestinal: Denies dysphagia, odynophagia, heartburn, reflux, water brash, abdominal pain or cramps, nausea, vomiting, bloating, diarrhea, constipation, hematemesis, melena, hematochezia  or hemorrhoids. Genitourinary: Denies dysuria, frequency, urgency, nocturia, hesitancy, discharge, hematuria or flank pain. Musculoskeletal: Denies arthralgias, myalgias, stiffness, jt. swelling, pain, limping or strain/sprain.  Skin: Denies pruritus, rash, hives, warts, acne, eczema or change in skin lesion(s). Neuro: No weakness, tremor, incoordination, spasms, paresthesia or pain. Psychiatric: Denies confusion, memory loss or sensory loss. Endo: Denies change in weight, skin or hair change.  Heme/Lymph: No excessive bleeding, bruising or enlarged lymph nodes.  Physical Exam  BP 126/84 mmHg  Pulse 104  Temp(Src) 97.7 F (36.5 C)  Resp 16  Ht 6' 1.5" (1.867 m)  Wt 200 lb 6.4 oz (90.901 kg)  BMI 26.08 kg/m2  Appears well nourished and in no distress. Eyes: PERRLA, EOMs, conjunctiva no swelling or erythema. Sinuses: No frontal/maxillary tenderness ENT/Mouth: EAC's clear, TM's nl w/o erythema, bulging. Nares clear w/o erythema, swelling, exudates. Oropharynx clear without erythema or exudates. Oral hygiene is good. Tongue normal, non obstructing. Hearing intact.  Neck: Supple. Thyroid nl. Car 2+/2+ without bruits, nodes or JVD. Chest: Respirations nl with BS clear & equal w/o rales, rhonchi, wheezing or stridor.  Cor: Heart sounds normal w/ regular rate and rhythm without sig. murmurs, gallops, clicks, or rubs. Peripheral pulses normal and equal  without edema.  Abdomen: Soft & bowel sounds normal. Non-tender w/o  guarding, rebound, hernias, masses, or organomegaly.  Lymphatics: Unremarkable.  Musculoskeletal: Full ROM all peripheral extremities, joint stability, 5/5 strength, and normal gait.  Skin: Warm, dry without exposed rashes, lesions or ecchymosis apparent.  Neuro: Cranial nerves intact, reflexes equal bilaterally. Sensory-motor testing grossly intact. Tendon reflexes grossly intact.  Pysch: Alert & oriented x 3.  Insight and judgement nl & appropriate. No ideations.  Assessment and Plan:  1. Essential hypertension  - TSH  2. Mixed hyperlipidemia  - Lipid panel - TSH  3. Prediabetes  - Hemoglobin A1c - Insulin, random  4. Vitamin D deficiency  - VITAMIN D 25 Hydroxy   5. Other abnormal glucose  - Hemoglobin A1c - Insulin, random  6. Testosterone deficiency  - Testosterone  7. Lumbar DDD w/Chronic Pain Syndrome   8. Medication management - CBC with Differential/Platelet - BASIC METABOLIC PANEL WITH GFR - Hepatic function panel - Magnesium   Recommended regular  exercise, BP monitoring, weight control, and discussed med and SE's. Recommended labs to assess and monitor clinical status. Further disposition pending results of labs. Over 30 minutes of exam, counseling, chart review was performed

## 2015-09-09 NOTE — Patient Instructions (Signed)

## 2015-09-10 LAB — VITAMIN D 25 HYDROXY (VIT D DEFICIENCY, FRACTURES): Vit D, 25-Hydroxy: 60 ng/mL (ref 30–100)

## 2015-09-10 LAB — HEMOGLOBIN A1C
HEMOGLOBIN A1C: 5.9 % — AB (ref <5.7)
MEAN PLASMA GLUCOSE: 123 mg/dL — AB (ref <117)

## 2015-09-10 LAB — TESTOSTERONE: TESTOSTERONE: 384 ng/dL (ref 300–890)

## 2015-09-10 LAB — INSULIN, RANDOM: INSULIN: 50.6 u[IU]/mL — AB (ref 2.0–19.6)

## 2015-09-21 DIAGNOSIS — M5136 Other intervertebral disc degeneration, lumbar region: Secondary | ICD-10-CM | POA: Diagnosis not present

## 2015-09-21 DIAGNOSIS — M5416 Radiculopathy, lumbar region: Secondary | ICD-10-CM | POA: Diagnosis not present

## 2015-09-25 IMAGING — US US ABDOMEN COMPLETE
1 series · 13 of 25 positions shown · non-contrast
Comparison: None in PACs

CLINICAL DATA: Unintentional 20 lb weight loss, elevated alkaline
phosphatase, thrombocytosis.

EXAM:
ULTRASOUND ABDOMEN COMPLETE

[Series 1: us abdomen complete · 0.18mm/px · 13 of 45 slices shown]
[im 1/45]
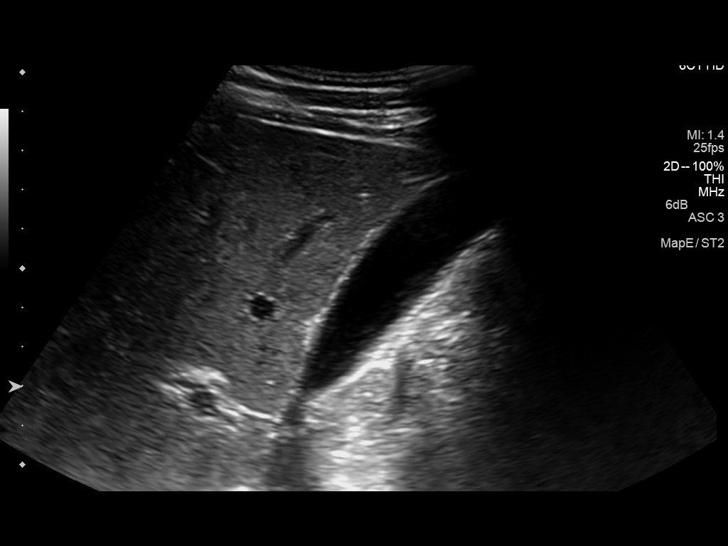
[im 4/45]
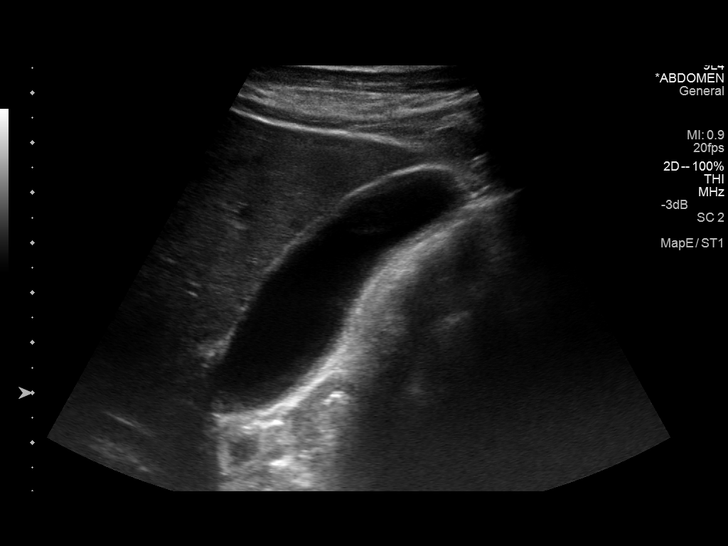
[im 8/45]
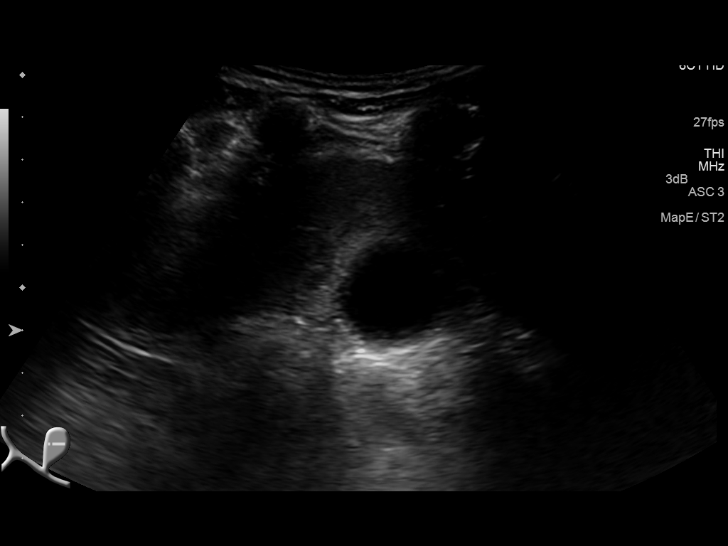
[im 12/45]
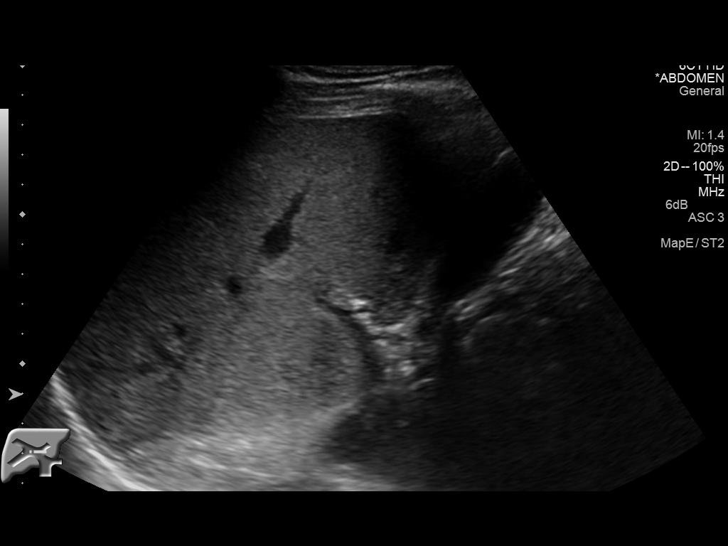
[im 15/45]
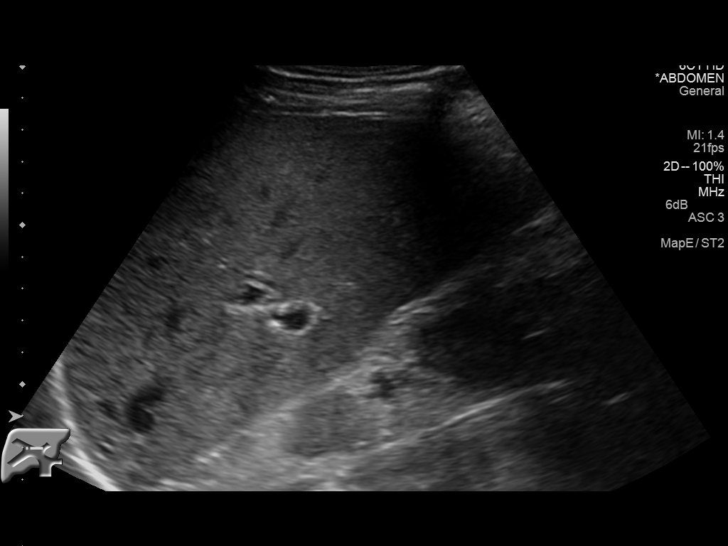
[im 19/45]
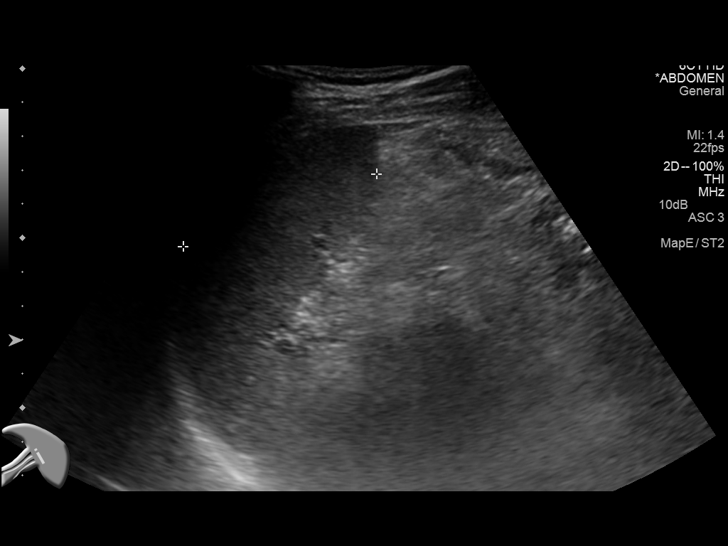
[im 23/45]
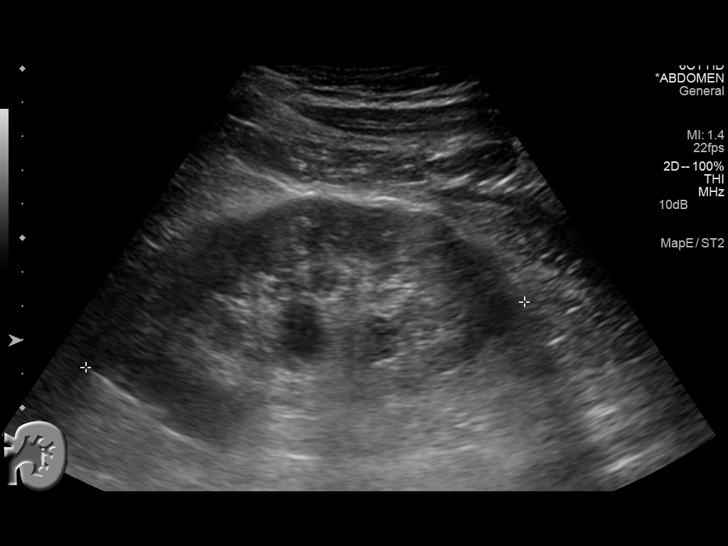
[im 26/45]
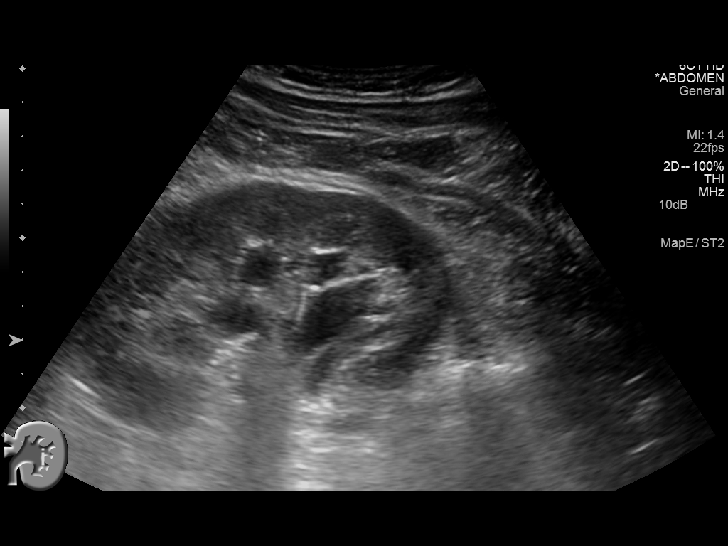
[im 30/45]
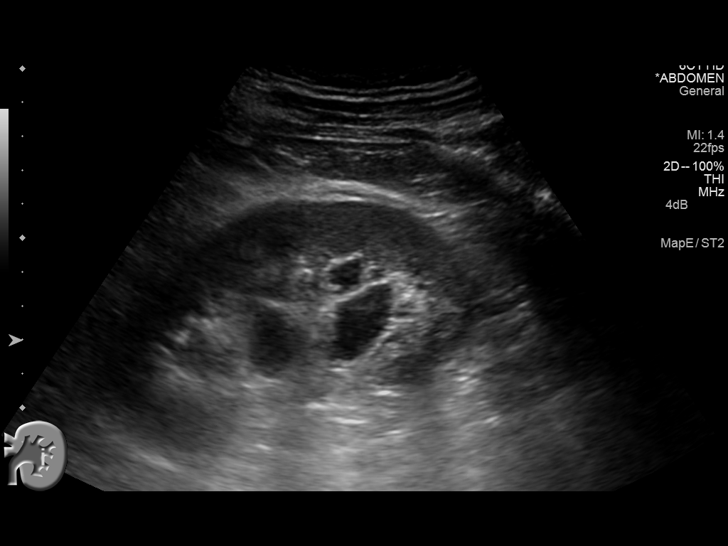
[im 34/45]
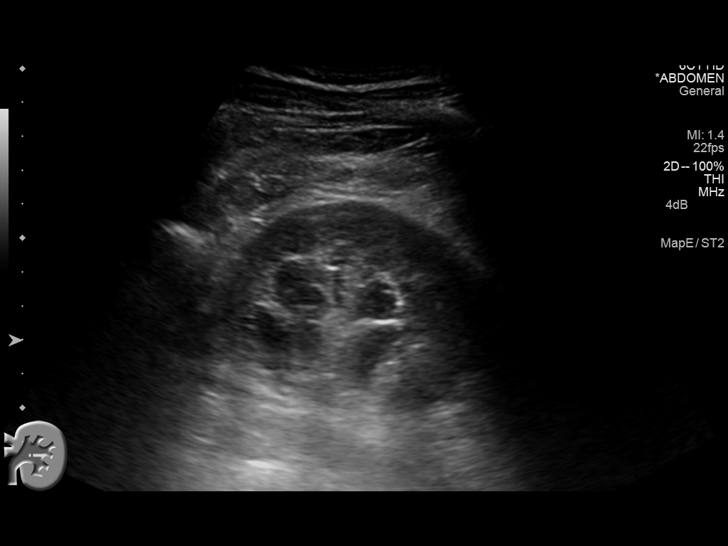
[im 37/45]
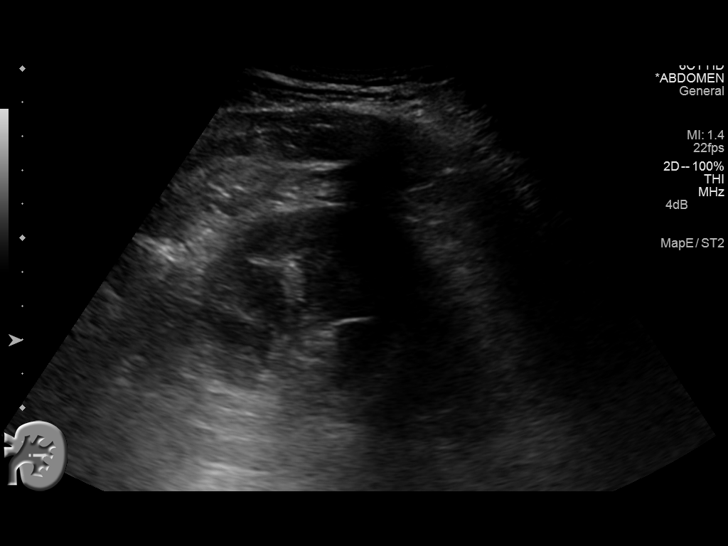
[im 41/45]
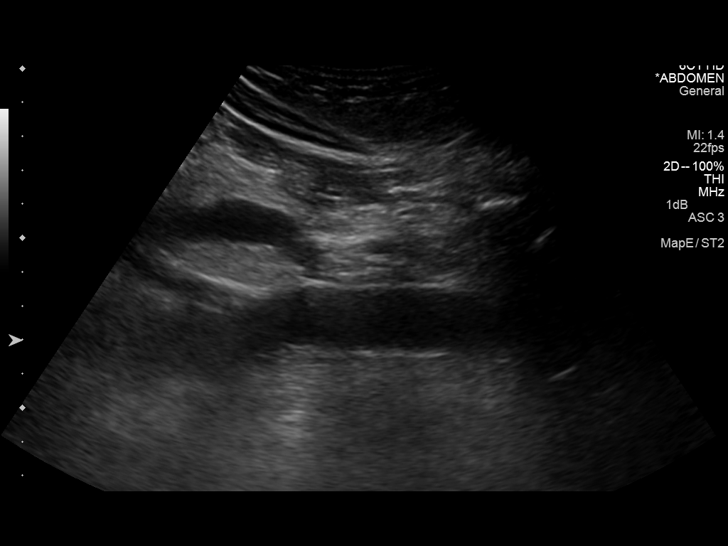
[im 45/45]
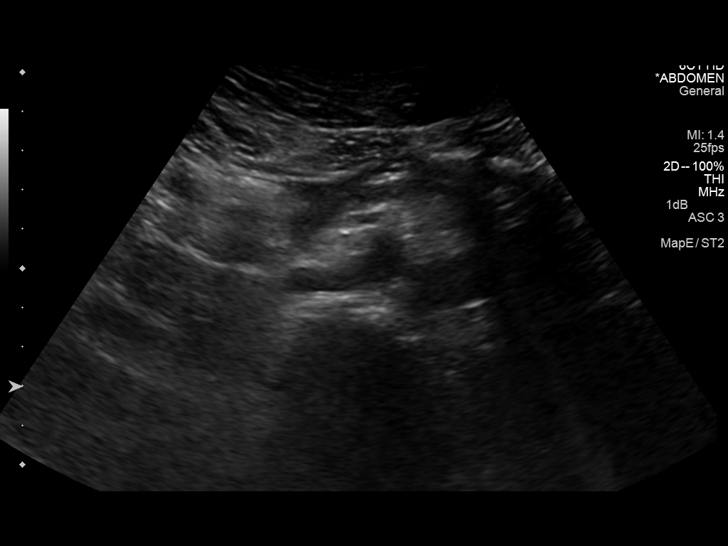

[13 of 25 positions shown; findings below may reference images not displayed]

FINDINGS: Gallbladder: The gallbladder is adequately distended. No stones or
sludge are observed. The gallbladder wall remains normal at 2.6 mm.
There is no pericholecystic fluid. There is no positive sonographic
Murphy's sign.

Common bile duct: Diameter: 3.8 mm

Liver: The liver exhibits normal echotexture with no focal mass or
ductal dilation observed. The surface contour is normal.

IVC: No abnormality visualized.

Pancreas: Bowel gas limits evaluation of the pancreatic head and
tail. No pancreatic ductal dilation is observed.

Spleen: The spleen is normal in size and echotexture.

Right Kidney: Length: 11 point 5 cm. The renal cortical echotexture
remains lower than that of the adjacent liver. There is no cystic or
solid mass or hydronephrosis.

Left Kidney: Length: 13.1 cm. The renal cortical echotexture is
normal. There is mild hydronephrosis. No cystic or solid renal mass
is observed.

Abdominal aorta: The caliber of the abdominal aorta is normal.

Other findings: There is no ascites.
IMPRESSION: 1. Limited evaluation of the pancreas. Further evaluation with
pancreatic protocol MRI or CT scan would be useful.
2. Hydronephrosis on the left of uncertain etiology. No renal masses
demonstrated. A triphasic abdominal and pelvic CT scan would be
useful for further evaluation of the left kidney, left ureter, and
urinary bladder.
3. The gallbladder, liver, spleen, and abdominal aorta are
unremarkable.

## 2015-10-29 ENCOUNTER — Encounter: Payer: Self-pay | Admitting: Gastroenterology

## 2015-11-06 IMAGING — CR DG CHEST 2V
4 series · 4 of 4 positions shown · non-contrast
Comparison: 09/26/2014

CLINICAL DATA: Weight loss, history of smoking

EXAM:
CHEST  2 VIEW

[chest pa (1 of 2)]
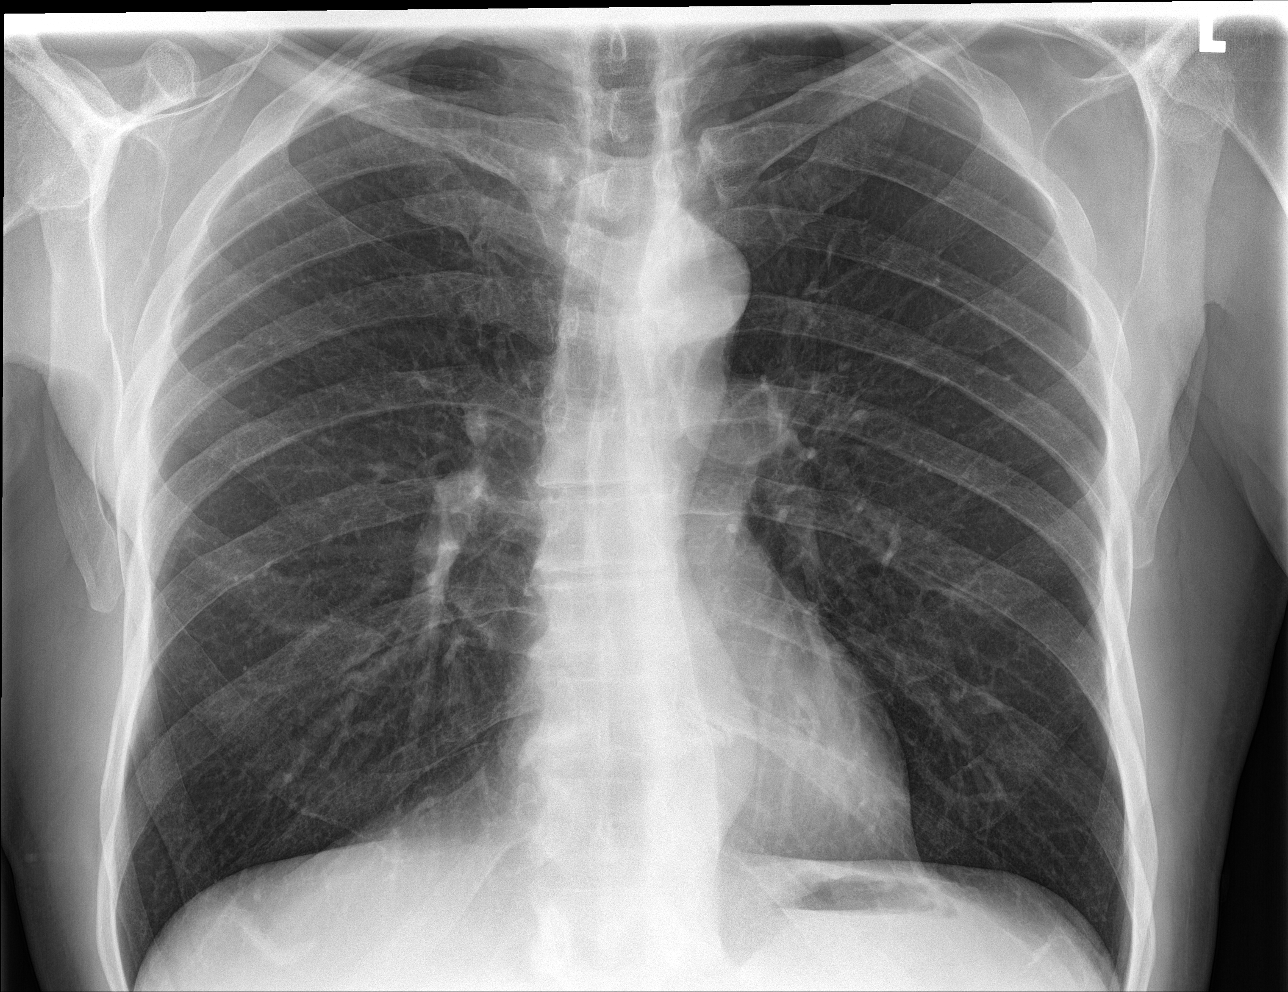

[chest lat (1 of 2)]
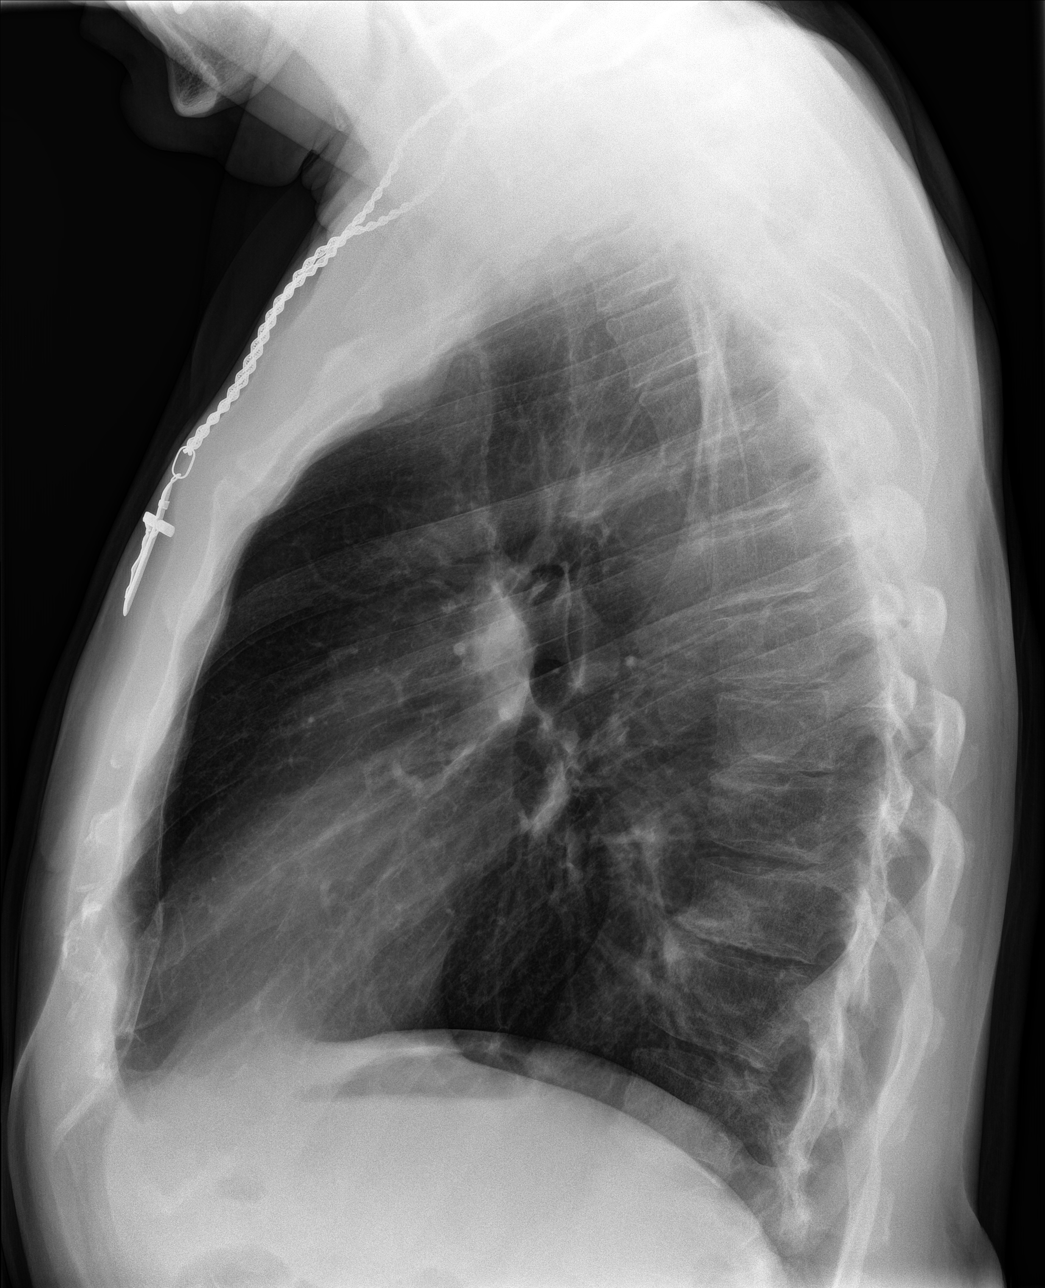

[chest pa (2 of 2)]
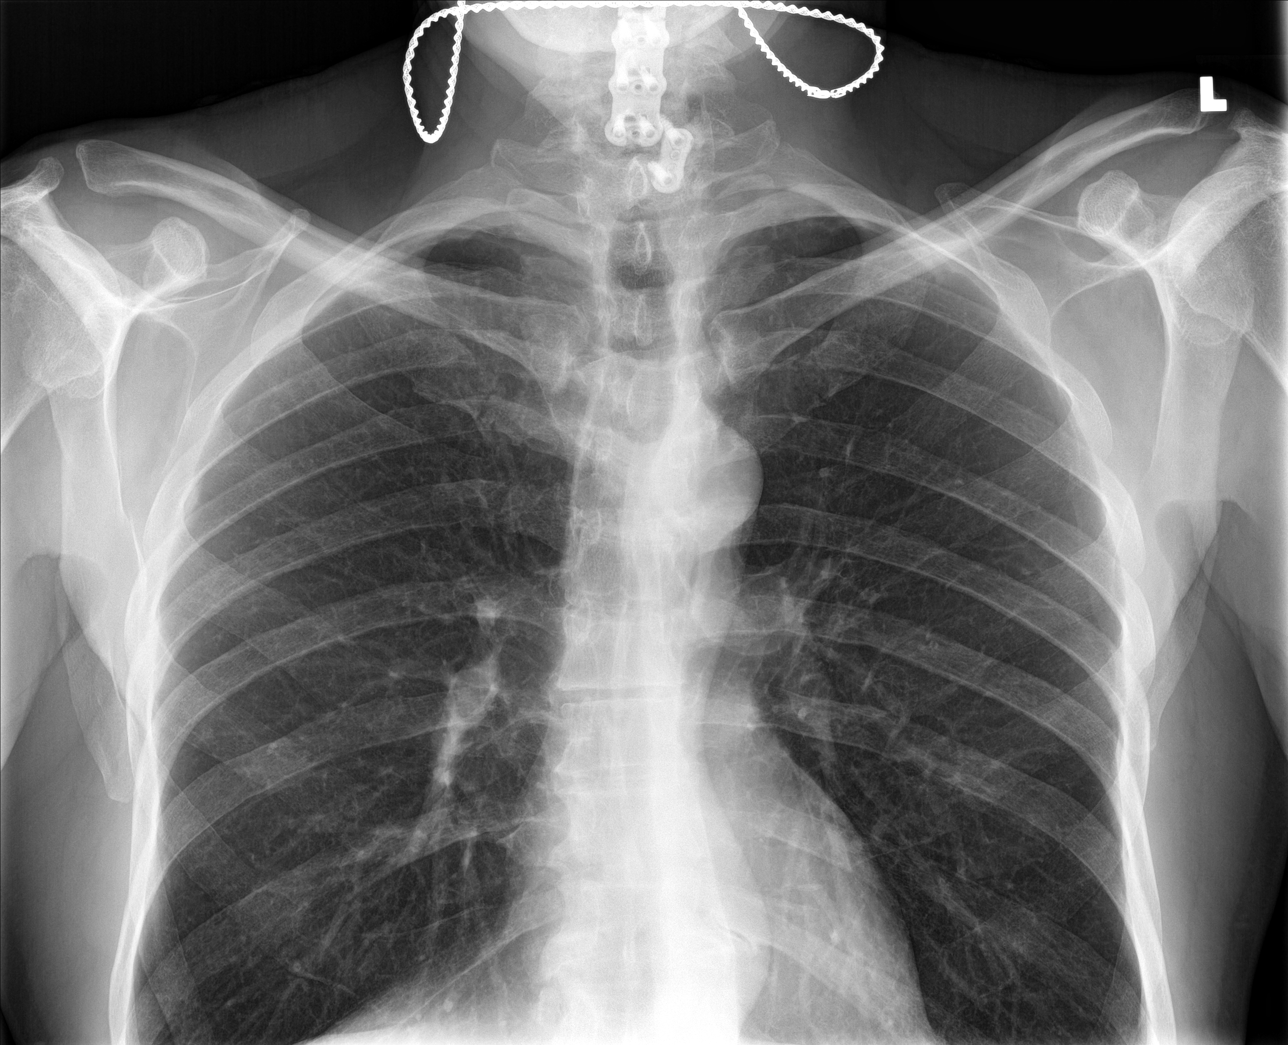

[chest lat (2 of 2)]
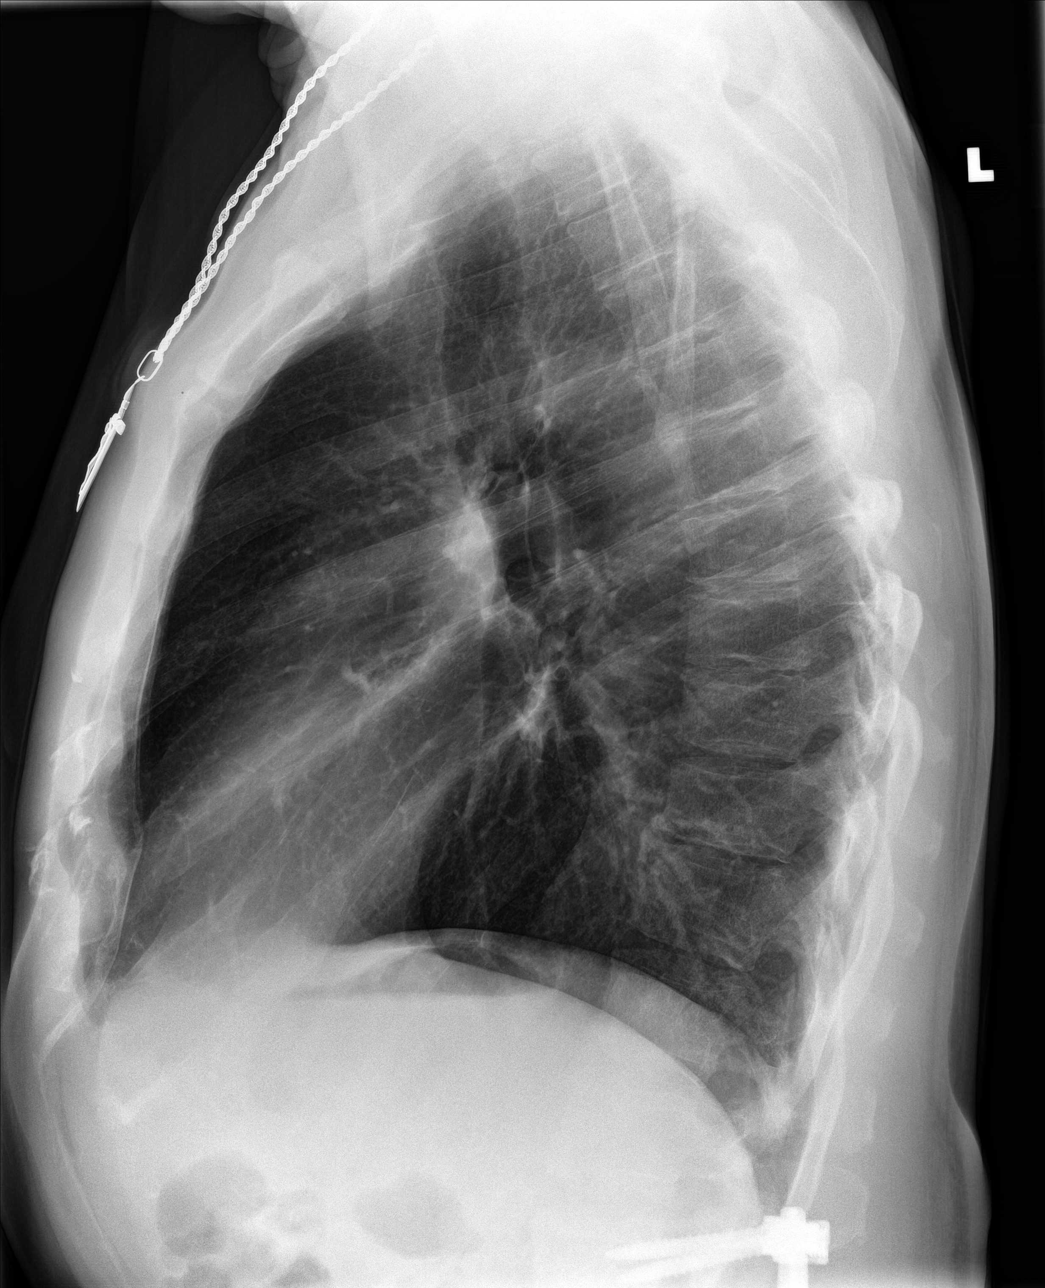

[4 of 4 positions shown; findings below may reference images not displayed]

FINDINGS: Cardiomediastinal silhouette is stable. Hyperinflation again noted.
No acute infiltrate or pleural effusion. No pulmonary edema. Bony
thorax is unremarkable.
IMPRESSION: No active disease.  Hyperinflation again noted.

## 2015-12-09 ENCOUNTER — Encounter: Payer: Self-pay | Admitting: Internal Medicine

## 2015-12-09 ENCOUNTER — Ambulatory Visit (INDEPENDENT_AMBULATORY_CARE_PROVIDER_SITE_OTHER): Payer: Medicare Other | Admitting: Internal Medicine

## 2015-12-09 VITALS — BP 124/82 | HR 64 | Temp 98.0°F | Resp 18 | Ht 73.5 in | Wt 200.0 lb

## 2015-12-09 DIAGNOSIS — Z23 Encounter for immunization: Secondary | ICD-10-CM | POA: Diagnosis not present

## 2015-12-09 DIAGNOSIS — R6889 Other general symptoms and signs: Secondary | ICD-10-CM | POA: Diagnosis not present

## 2015-12-09 DIAGNOSIS — E559 Vitamin D deficiency, unspecified: Secondary | ICD-10-CM

## 2015-12-09 DIAGNOSIS — E349 Endocrine disorder, unspecified: Secondary | ICD-10-CM

## 2015-12-09 DIAGNOSIS — K5730 Diverticulosis of large intestine without perforation or abscess without bleeding: Secondary | ICD-10-CM | POA: Diagnosis not present

## 2015-12-09 DIAGNOSIS — Z79899 Other long term (current) drug therapy: Secondary | ICD-10-CM

## 2015-12-09 DIAGNOSIS — K2100 Gastro-esophageal reflux disease with esophagitis, without bleeding: Secondary | ICD-10-CM

## 2015-12-09 DIAGNOSIS — R7303 Prediabetes: Secondary | ICD-10-CM

## 2015-12-09 DIAGNOSIS — M4316 Spondylolisthesis, lumbar region: Secondary | ICD-10-CM

## 2015-12-09 DIAGNOSIS — E785 Hyperlipidemia, unspecified: Secondary | ICD-10-CM | POA: Diagnosis not present

## 2015-12-09 DIAGNOSIS — R7309 Other abnormal glucose: Secondary | ICD-10-CM | POA: Diagnosis not present

## 2015-12-09 DIAGNOSIS — E291 Testicular hypofunction: Secondary | ICD-10-CM

## 2015-12-09 DIAGNOSIS — N529 Male erectile dysfunction, unspecified: Secondary | ICD-10-CM

## 2015-12-09 DIAGNOSIS — Z0000 Encounter for general adult medical examination without abnormal findings: Secondary | ICD-10-CM

## 2015-12-09 DIAGNOSIS — I10 Essential (primary) hypertension: Secondary | ICD-10-CM | POA: Diagnosis not present

## 2015-12-09 DIAGNOSIS — M5136 Other intervertebral disc degeneration, lumbar region: Secondary | ICD-10-CM

## 2015-12-09 DIAGNOSIS — Z0001 Encounter for general adult medical examination with abnormal findings: Secondary | ICD-10-CM

## 2015-12-09 DIAGNOSIS — E782 Mixed hyperlipidemia: Secondary | ICD-10-CM | POA: Diagnosis not present

## 2015-12-09 LAB — HEPATIC FUNCTION PANEL
ALBUMIN: 4.1 g/dL (ref 3.6–5.1)
ALT: 27 U/L (ref 9–46)
AST: 20 U/L (ref 10–35)
Alkaline Phosphatase: 75 U/L (ref 40–115)
BILIRUBIN TOTAL: 0.5 mg/dL (ref 0.2–1.2)
Bilirubin, Direct: 0.1 mg/dL (ref <=0.2)
Indirect Bilirubin: 0.4 mg/dL (ref 0.2–1.2)
Total Protein: 7.3 g/dL (ref 6.1–8.1)

## 2015-12-09 LAB — BASIC METABOLIC PANEL WITH GFR
BUN: 12 mg/dL (ref 7–25)
CHLORIDE: 103 mmol/L (ref 98–110)
CO2: 28 mmol/L (ref 20–31)
CREATININE: 1.03 mg/dL (ref 0.70–1.25)
Calcium: 10 mg/dL (ref 8.6–10.3)
GFR, EST NON AFRICAN AMERICAN: 76 mL/min (ref >=60)
GFR, Est African American: 88 mL/min (ref >=60)
Glucose, Bld: 82 mg/dL (ref 65–99)
POTASSIUM: 4.5 mmol/L (ref 3.5–5.3)
SODIUM: 139 mmol/L (ref 135–146)

## 2015-12-09 LAB — CBC WITH DIFFERENTIAL/PLATELET
BASOS ABS: 0 10*3/uL (ref 0.0–0.1)
BASOS PCT: 1 % (ref 0–1)
EOS ABS: 0.1 10*3/uL (ref 0.0–0.7)
Eosinophils Relative: 3 % (ref 0–5)
HCT: 46.6 % (ref 39.0–52.0)
Hemoglobin: 16.3 g/dL (ref 13.0–17.0)
Lymphocytes Relative: 35 % (ref 12–46)
Lymphs Abs: 1.5 10*3/uL (ref 0.7–4.0)
MCH: 33.3 pg (ref 26.0–34.0)
MCHC: 35 g/dL (ref 30.0–36.0)
MCV: 95.3 fL (ref 78.0–100.0)
MPV: 10.6 fL (ref 8.6–12.4)
Monocytes Absolute: 0.4 10*3/uL (ref 0.1–1.0)
Monocytes Relative: 10 % (ref 3–12)
NEUTROS ABS: 2.2 10*3/uL (ref 1.7–7.7)
Neutrophils Relative %: 51 % (ref 43–77)
PLATELETS: 222 10*3/uL (ref 150–400)
RBC: 4.89 MIL/uL (ref 4.22–5.81)
RDW: 13.6 % (ref 11.5–15.5)
WBC: 4.3 10*3/uL (ref 4.0–10.5)

## 2015-12-09 LAB — LIPID PANEL
CHOLESTEROL: 161 mg/dL (ref 125–200)
HDL: 45 mg/dL (ref >=40)
LDL Cholesterol: 92 mg/dL (ref <130)
Total CHOL/HDL Ratio: 3.6 Ratio (ref <=5.0)
Triglycerides: 119 mg/dL (ref <150)
VLDL: 24 mg/dL (ref <30)

## 2015-12-09 LAB — TSH: TSH: 2.97 m[IU]/L (ref 0.40–4.50)

## 2015-12-09 NOTE — Progress Notes (Signed)
Patient ID: Jacob Larsen, male   DOB: 1950-09-21, 65 y.o.   MRN: [id number]  MEDICARE ANNUAL WELLNESS VISIT AND FOLLOW UP Assessment:    1. Medication management  - CBC with Differential/Platelet - BASIC METABOLIC PANEL WITH GFR - Hepatic function panel  2. Hyperlipidemia -cont diet and exercise - Lipid panel  3. Prediabetes -cont diet and exercise - Hemoglobin A1c  4. Essential hypertension -cont meds -monitor at home -dash diet - TSH  5. Need for prophylactic vaccination against Streptococcus pneumoniae (pneumococcus)  - Pneumococcal conjugate vaccine 13-valent  6. Diverticulosis of large intestine without hemorrhage -cont regular colonoscopy monitoring  7. Reflux esophagitis -avoid trigger foods -zantac  8. Lumbar degenerative disc disease -followed by Dr. Joya Salm  9. Spondylolisthesis of lumbar region -followed by Dr. 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 Vitamin D deficiency -cont supplement  13. Testosterone deficiency -cont supplement -yearly monitoring     Over 30 minutes of exam, counseling, chart review, and critical decision making was performed  Plan:   During the course of the visit the patient was educated and counseled about appropriate screening and preventive services including:    Pneumococcal vaccine   Influenza vaccine  Prevnar 13  Td vaccine  Screening electrocardiogram  Colorectal cancer screening  Diabetes screening  Glaucoma screening  Nutrition counseling   Conditions/risks identified: BMI: Discussed weight loss, diet, and increase physical activity.  Increase physical activity: AHA recommends 150 minutes of physical activity a week.  Medications reviewed Diabetes is at goal, ACE/ARB therapy: No, Reason not on Ace Inhibitor/ARB therapy:  not indicated Urinary Incontinence is not an issue: discussed non pharmacology and pharmacology options.  Fall risk: moderate- discussed PT, home fall  assessment, medications.    Subjective:  Jacob Larsen is a 65 y.o. male who presents for Medicare Annual Wellness Visit and 3 month follow up for HTN, hyperlipidemia, prediabetes, and vitamin D Def.  This is the first medicare wellness visit.  His blood pressure has been controlled at home, today their BP is BP: 124/82 mmHg He does not workout. He denies chest pain, shortness of breath, dizziness.  He did recently have back surgery for hardware replacement.  He reports that he is still recovering from his surgery in February.   He is on cholesterol medication and denies myalgias. His cholesterol is at goal. The cholesterol last visit was:   Lab Results  Component Value Date   CHOL 134 09/09/2015   HDL 42 09/09/2015   LDLCALC 63 09/09/2015   TRIG 147 09/09/2015   CHOLHDL 3.2 09/09/2015   He has been working on diet and exercise for prediabetes, and denies foot ulcerations, hyperglycemia, hypoglycemia , increased appetite, nausea, polydipsia, polyuria, visual disturbances, vomiting and weight loss. Last A1C in the office was:  Lab Results  Component Value Date   HGBA1C 5.9* 09/09/2015   Last GFR NonAA   Lab Results  Component Value Date   [facility] 81 09/09/2015   AA  Lab Results  Component Value Date   GFRAA >89 09/09/2015   Patient is on Vitamin D supplement.   Lab Results  Component Value Date   VD25OH 89 09/09/2015      He reports that he is recovering well from his recent surgery on his back.  He is still having some sciatica symptoms in his left leg.    Medication Review: Current Outpatient Prescriptions on File Prior to Visit  Medication Sig Dispense Refill  . atorvastatin (LIPITOR) 40 MG  tablet Take 40 mg by mouth 3 (three) times a week. Tuesday, Thursday, Saturday    . Cholecalciferol (VITAMIN D) 2000 UNITS tablet Take 4,000 Units by mouth daily.     . Cyanocobalamin (VITAMIN B-12 PO) Take 1 tablet by mouth daily.    . diclofenac (VOLTAREN) 75 MG EC tablet TAKE  1 TABLET BY MOUTH TWICE A DAY 180 tablet 1  . finasteride (PROSCAR) 5 MG tablet Take 1 tablet daily for prostate 90 tablet 1  . Flaxseed, Linseed, (FLAX SEED OIL) 1000 MG CAPS Take 1 capsule (1,000 mg total) by mouth 2 (two) times daily.  0  . loratadine (CLARITIN) 10 MG tablet Take 10 mg by mouth as needed for allergies.     . Magnesium 250 MG TABS Take 250 mg by mouth daily.    Marland Kitchen oxyCODONE (ROXICODONE) 15 MG immediate release tablet Take 15 mg by mouth every 6 (six) hours as needed.  0  . ranitidine (ZANTAC) 300 MG tablet TAKE 1 TABLET BY MOUTH TWICE A DAY 180 tablet 1  . tamsulosin (FLOMAX) 0.4 MG CAPS capsule Take 2 capsules daily for prostate 180 capsule 1  . testosterone cypionate (DEPOTESTOSTERONE CYPIONATE) 200 MG/ML injection INJECT 2 MLS IM EVERY 2 WEEKS 10 mL 3   No current facility-administered medications on file prior to visit.    Current Problems (verified) Patient Active Problem List   Diagnosis Date Noted  . Medication management 09/09/2015  . Spondylolisthesis of lumbar region 04/28/2015  . Testosterone deficiency 11/06/2014  . Lumbar degenerative disc disease 09/23/2014  . Essential hypertension 08/13/2013  . Mixed hyperlipidemia 08/13/2013  . Prediabetes 08/13/2013  . Reflux esophagitis 08/13/2013  . Vitamin D deficiency 08/13/2013  . Impotence of organic origin 08/13/2013  . Diverticulosis of large intestine 04/01/2008    Screening Tests Immunization History  Administered Date(s) Administered  . Influenza Split 06/18/2015  . Influenza-Unspecified 08/17/2014  . PPD Test 05/27/2014  . Pneumococcal Conjugate-13 12/09/2015  . Pneumococcal Polysaccharide-23 04/08/2013  . Td 09/13/2003  . Tdap 05/27/2014    Preventative care: Last colonoscopy: 2008   Prior vaccinations: TD or Tdap: 2015  Influenza: 2016  Pneumococcal: Indicated Prevnar13: Given today Shingles/Zostavax: Declined Names of Other Physician/Practitioners you currently use: 1. [facility] here for primary care 2. Dr. Sabra Heck, eye doctor, last visit 2016 3. Dr.  Rona Ravens, dentist, last visit 2016 Patient Care Team: Unk Pinto, MD as PCP - General (Internal Medicine) Inda Castle, MD as Consulting Physician (Gastroenterology) Kathie Rhodes, MD as Consulting Physician (Urology) Leeroy Cha, MD as Consulting Physician (Neurosurgery) Barbaraann Cao, OD as Referring Physician (Optometry) Druscilla Brownie, MD as Consulting Physician (Dermatology)  Past Surgical History  Procedure Laterality Date  . Cervical fusion  2011/2014  . Knee arthroscopy      right 1985; left 1992  . Septoplasty    . Shoulder arthroscopy Left 2015  . Shoulder surgery Right 08  . Colonoscopy    . Back surgery      lumbar fusion   Family History  Problem Relation Age of Onset  . Colon cancer Neg Hx   . Stomach cancer Neg Hx   . Leukemia Mother   . Diabetes Mother   . Hypertension Father   . Diabetes Father    Social History  Substance Use Topics  . Smoking status: Former Smoker    Quit date: 05/28/1995  . Smokeless tobacco: None     Comment: quit smoking >20+yrs ago  . Alcohol  Use: 0.0 oz/week    0 Standard drinks or equivalent per week     Comment: rarely    MEDICARE WELLNESS OBJECTIVES: Tobacco use: He does not smoke.  Patient is a former smoker. If yes, counseling given Alcohol Current alcohol use: social drinker Osteoporosis: hypogonadism, History of fracture in the past year: no Fall risk: Moderate Risk Hearing: normal Visual acuity: normal,  does perform annual eye exam Diet: well balanced Physical activity: Current Exercise Habits: Home exercise routine, Type of exercise: stretching;calisthenics, Time (Minutes): 30, Frequency (Times/Week): 6, Weekly Exercise (Minutes/Week): 180, Intensity: Moderate Cardiac risk factors: Cardiac Risk Factors include: dyslipidemia;hypertension;male gender;advanced age (>27men, >75  women);sedentary lifestyle Depression/mood screen:   Depression screen [facility] 2/9 12/09/2015  Decreased Interest 0  Down, Depressed, Hopeless 0  PHQ - 2 Score 0    ADLs:  In your present state of health, do you have any difficulty performing the following activities: 12/09/2015 09/09/2015  Hearing? N N  Vision? N N  Difficulty concentrating or making decisions? N N  Walking or climbing stairs? N N  Dressing or bathing? N N  Doing errands, shopping? N N  Preparing Food and eating ? N -  Using the Toilet? N -  In the past six months, have you accidently leaked urine? N -  Do you have problems with loss of bowel control? N -  Managing your Medications? N -  Managing your Finances? N -  Housekeeping or managing your Housekeeping? N -     Cognitive Testing  Alert? Yes  Normal Appearance?Yes  Oriented to person? Yes  Place? Yes   Time? Yes  Recall of three objects?  Yes  Can perform simple calculations? Yes  Displays appropriate judgment?Yes  Can read the correct time from a watch face?Yes  EOL planning: Does patient have an advance directive?: No Would patient like information on creating an advanced directive?: Yes - Educational materials given   Objective:   Today's Vitals   12/09/15 1026  BP: 124/82  Pulse: 64  Temp: 98 F (36.7 C)  TempSrc: Temporal  Resp: 18  Height: 6' 1.5" (1.867 m)  Weight: 200 lb (90.719 kg)   Body mass index is 26.03 kg/(m^2).  General appearance: alert, no distress, WD/WN, male HEENT: normocephalic, sclerae anicteric, TMs pearly, nares patent, no discharge or erythema, pharynx normal Oral cavity: MMM, no lesions Neck: supple, no lymphadenopathy, no thyromegaly, no masses Heart: RRR, normal S1, S2, no murmurs Lungs: CTA bilaterally, no wheezes, rhonchi, or rales Abdomen: +bs, soft, non tender, non distended, no masses, no hepatomegaly, no splenomegaly Musculoskeletal: nontender, no swelling, no obvious deformity Extremities: no edema, no  cyanosis, no clubbing Pulses: 2+ symmetric, upper and lower extremities, normal cap refill Neurological: alert, oriented x 3, CN2-12 intact, strength normal upper extremities and lower extremities, sensation normal throughout, DTRs 2+ throughout, no cerebellar signs, gait normal Psychiatric: normal affect, behavior normal, pleasant   Medicare Attestation I have personally reviewed: The patient's medical and social history Their use of alcohol, tobacco or illicit drugs Their current medications and supplements The patient's functional ability including ADLs,fall risks, home safety risks, cognitive, and hearing and visual impairment Diet and physical activities Evidence for depression or mood disorders  The patient's weight, height, BMI, and visual acuity have been recorded in the chart.  I have made referrals, counseling, and provided education to the patient based on review of the above and I have provided the patient with a written personalized care plan for preventive services.  Starlyn Skeans, PA-C   12/09/2015

## 2015-12-10 LAB — HEMOGLOBIN A1C
Hgb A1c MFr Bld: 5.8 % — ABNORMAL HIGH (ref <5.7)
MEAN PLASMA GLUCOSE: 120 mg/dL

## 2015-12-16 ENCOUNTER — Ambulatory Visit (INDEPENDENT_AMBULATORY_CARE_PROVIDER_SITE_OTHER): Payer: Medicare Other | Admitting: Internal Medicine

## 2015-12-16 ENCOUNTER — Encounter: Payer: Self-pay | Admitting: Internal Medicine

## 2015-12-16 VITALS — BP 130/78 | HR 88 | Temp 98.2°F | Resp 18 | Ht 73.5 in | Wt 202.0 lb

## 2015-12-16 DIAGNOSIS — H6593 Unspecified nonsuppurative otitis media, bilateral: Secondary | ICD-10-CM | POA: Diagnosis not present

## 2015-12-16 MED ORDER — FLUTICASONE PROPIONATE 50 MCG/ACT NA SUSP: 2.0000 | Freq: Every day | NASAL | Status: DC

## 2015-12-16 MED ORDER — CHLORPHEN-PE-ACETAMINOPHEN 4-10-325 MG PO TABS: 1.0000 | ORAL_TABLET | Freq: Three times a day (TID) | ORAL | Status: DC

## 2015-12-16 NOTE — Progress Notes (Signed)
   Subjective:    Patient ID: Jacob Larsen, male    DOB: 1951-08-15, 65 y.o.   MRN: [id number]  Otalgia  Associated symptoms include rhinorrhea. Pertinent negatives include no sore throat.  Patient presents to the office for whooshing sounds in his ears for the past month.  He reports that in the last two days it has gotten worse.  He reports that it comes and goes and feels like his ear is in a seashell.  He did use some sinus nasal spray and that helped.  He reports that he has been having some congestion and some runny nose.  He has been using allegra and also taking a sinus tablet.     Review of Systems  Constitutional: Negative for fever, chills and fatigue.  HENT: Positive for congestion, ear pain, postnasal drip, rhinorrhea and tinnitus. Negative for sinus pressure and sore throat.        Objective:   Physical Exam  Constitutional: He is oriented to person, place, and time. He appears well-developed and well-nourished. No distress.  HENT:  Head: Normocephalic.  Right Ear: Hearing, external ear and ear canal normal. No drainage, swelling or tenderness. A middle ear effusion is present. No decreased hearing is noted.  Left Ear: Hearing, external ear and ear canal normal. No drainage, swelling or tenderness. A middle ear effusion is present. No decreased hearing is noted.  Nose: Mucosal edema present.  Mouth/Throat: Uvula is midline, oropharynx is clear and moist and mucous membranes are normal. No oropharyngeal exudate.  Eyes: Conjunctivae are normal. No scleral icterus.  Neck: Normal range of motion. Neck supple. No JVD present. No thyromegaly present.  Cardiovascular: Normal rate, regular rhythm, normal heart sounds and intact distal pulses.  Exam reveals no gallop and no friction rub.   No murmur heard. Pulmonary/Chest: Effort normal and breath sounds normal. No respiratory distress. He has no wheezes. He has no rales. He exhibits no tenderness.  Musculoskeletal: Normal range of  motion.  Lymphadenopathy:    He has no cervical adenopathy.  Neurological: He is alert and oriented to person, place, and time.  Skin: Skin is warm and dry. He is not diaphoretic.  Psychiatric: He has a normal mood and affect. His behavior is normal. Judgment and thought content normal.  Nursing note and vitals reviewed.   Filed Vitals:   12/16/15 0917  BP: 130/78  Pulse: 88  Temp: 98.2 F (36.8 C)  Resp: 18         Assessment & Plan:    1. Middle ear effusion, bilateral -cont claritin qd - fluticasone (FLONASE) 50 MCG/ACT nasal spray; Place 2 sprays into both nostrils daily.  Dispense: 16 g; Refill: 3 - Chlorphen-PE-Acetaminophen 4-10-325 MG TABS; Take 1 tablet by mouth 3 (three) times daily.  Dispense: 30 tablet; Refill: 0

## 2015-12-16 NOTE — Patient Instructions (Signed)
Tinnitus  Tinnitus refers to hearing a sound when there is no actual source for that sound. This is often described as ringing in the ears. However, people with this condition may hear a variety of noises. A person may hear the sound in one ear or in both ears.   The sounds of tinnitus can be soft, loud, or somewhere in between. Tinnitus can last for a few seconds or can be constant for days. It may go away without treatment and come back at various times. When tinnitus is constant or happens often, it can lead to other problems, such as trouble sleeping and trouble concentrating.  Almost everyone experiences tinnitus at some point. Tinnitus that is long-lasting (chronic) or comes back often is a problem that may require medical attention.   CAUSES   The cause of tinnitus is often not known. In some cases, it can result from other problems or conditions, including:   · Exposure to loud noises from machinery, music, or other sources.  · Hearing loss.  · Ear or sinus infections.  · Earwax buildup.  · A foreign object in the ear.  · Use of certain medicines.  · Use of alcohol and caffeine.  · High blood pressure.  · Heart diseases.  · Anemia.  · Allergies.  · Meniere disease.  · Thyroid problems.  · Tumors.  · An enlarged part of a weakened blood vessel (aneurysm).  SYMPTOMS  The main symptom of tinnitus is hearing a sound when there is no source for that sound. It may sound like:   · Buzzing.  · Roaring.  · Ringing.  · Blowing air, similar to the sound heard when you listen to a seashell.  · Hissing.  · Whistling.  · Sizzling.  · Humming.  · Running water.  · A sustained musical note.  DIAGNOSIS   Tinnitus is diagnosed based on your symptoms. Your health care provider will do a physical exam. A comprehensive hearing exam (audiologic exam) will be done if your tinnitus:   · Affects only one ear (unilateral).  · Causes hearing difficulties.  · Lasts 6 months or longer.  You may also need to see a health care provider  who specializes in hearing disorders (audiologist). You may be asked to complete a questionnaire to determine the severity of your tinnitus. Tests may be done to help determine the cause and to rule out other conditions. These can include:  · Imaging studies of your head and brain, such as:    A CT scan.    An MRI.  · An imaging study of your blood vessels (angiogram).  TREATMENT   Treating an underlying medical condition can sometimes make tinnitus go away. If your tinnitus continues, other treatments may include:  · Medicines, such as certain antidepressants or sleeping aids.  · Sound generators to mask the tinnitus. These include:  ¨ Tabletop sound machines that play relaxing sounds to help you fall asleep.  ¨ Wearable devices that fit in your ear and play sounds or music.  ¨ A small device that uses headphones to deliver a signal embedded in music (acoustic neural stimulation). In time, this may change the pathways of your brain and make you less sensitive to tinnitus. This device is used for very severe cases when no other treatment is working.  · Therapy and counseling to help you manage the stress of living with tinnitus.  · Using hearing aids or cochlear implants, if your tinnitus is related to hearing   loss.  HOME CARE INSTRUCTIONS  · When possible, avoid being in loud places and being exposed to loud sounds.  · Wear hearing protection, such as earplugs, when you are exposed to loud noises.  · Do not take stimulants, such as nicotine, alcohol, or caffeine.  · Practice techniques for reducing stress, such as meditation, yoga, or deep breathing.  · Use a white noise machine, a humidifier, or other devices to mask the sound of tinnitus.  · Sleep with your head slightly raised. This may reduce the impact of tinnitus.  · Try to get plenty of rest each night.  SEEK MEDICAL CARE IF:  · You have tinnitus in just one ear.  · Your tinnitus continues for 3 weeks or longer without stopping.  · Home care measures are not  helping.  · You have tinnitus after a head injury.  · You have tinnitus along with any of the following:    Dizziness.    Loss of balance.    Nausea and vomiting.     This information is not intended to replace advice given to you by your health care provider. Make sure you discuss any questions you have with your health care provider.     Document Released: 08/29/2005 Document Revised: 09/19/2014 Document Reviewed: 01/29/2014  Elsevier Interactive Patient Education ©2016 [employer]

## 2016-01-06 ENCOUNTER — Other Ambulatory Visit: Payer: Self-pay | Admitting: *Deleted

## 2016-01-06 MED ORDER — LORATADINE 10 MG PO TABS: 10.0000 mg | ORAL_TABLET | ORAL | Status: DC | PRN

## 2016-01-20 ENCOUNTER — Other Ambulatory Visit: Payer: Self-pay | Admitting: Internal Medicine

## 2016-01-20 DIAGNOSIS — H9319 Tinnitus, unspecified ear: Secondary | ICD-10-CM

## 2016-01-22 ENCOUNTER — Other Ambulatory Visit: Payer: Self-pay | Admitting: Internal Medicine

## 2016-02-02 ENCOUNTER — Encounter: Payer: Self-pay | Admitting: Internal Medicine

## 2016-02-02 ENCOUNTER — Ambulatory Visit (INDEPENDENT_AMBULATORY_CARE_PROVIDER_SITE_OTHER): Payer: Medicare Other | Admitting: Internal Medicine

## 2016-02-02 VITALS — BP 120/74 | HR 64 | Temp 97.5°F | Resp 16 | Ht 73.5 in | Wt 202.0 lb

## 2016-02-02 DIAGNOSIS — S70362A Insect bite (nonvenomous), left thigh, initial encounter: Secondary | ICD-10-CM | POA: Diagnosis not present

## 2016-02-02 MED ORDER — TRIAMCINOLONE ACETONIDE 0.1 % EX CREA: 1.0000 "application " | TOPICAL_CREAM | Freq: Two times a day (BID) | CUTANEOUS | Status: DC

## 2016-02-02 NOTE — Progress Notes (Signed)
Subjective:    Patient ID: Jacob Larsen, male    DOB: [birth year], 65 y.o.   MRN: [id number]  HPI Patient presents with c/o several firm pruritic "spots" of his L buttocks - suspected "bug bites". Denied k/o tick bites, rash , fevers.   Medication Sig  . atorvastatin (LIPITOR) 40 MG tablet Take 40 mg by mouth 3 (three) times a week. Tuesday, Thursday, Saturday  . Chlorphen-PE-Acetaminophen 4-10-325 MG TABS Take 1 tablet by mouth 3 (three) times daily.  . Cholecalciferol (VITAMIN D) 2000 UNITS tablet Take 4,000 Units by mouth daily.   . diclofenac (VOLTAREN) 75 MG EC tablet TAKE 1 TABLET BY MOUTH TWICE A DAY  . finasteride (PROSCAR) 5 MG tablet Take 1 tablet daily for prostate  . Flaxseed, Linseed, (FLAX SEED OIL) 1000 MG CAPS Take 1 capsule (1,000 mg total) by mouth 2 (two) times daily.  . fluticasone (FLONASE) 50 MCG/ACT nasal spray Place 2 sprays into both nostrils daily.  Marland Kitchen loratadine (CLARITIN) 10 MG tablet Take 1 tablet (10 mg total) by mouth as needed for allergies.  . Magnesium 250 MG TABS Take 250 mg by mouth daily.  Marland Kitchen oxyCODONE (ROXICODONE) 15 MG immediate release tablet Take 15 mg by mouth every 6 (six) hours as needed.  . ranitidine (ZANTAC) 300 MG tablet TAKE 1 TABLET BY MOUTH TWICE A DAY  . tamsulosin (FLOMAX) 0.4 MG CAPS capsule Take 2 capsules daily for prostate  . vitamin B-12 (CYANOCOBALAMIN) 1000 MCG tablet Take 1,000 mcg by mouth daily.  Marland Kitchen testosterone cypionate (DEPOTESTOSTERONE CYPIONATE) 200 MG/ML injection INJECT 2 MLS IM EVERY 2 WEEKS  . Cyanocobalamin (VITAMIN B-12 PO) Take 1 tablet by mouth daily. Reported on 02/02/2016   Allergies  Allergen Reactions  . Salicylates Rash  . Aspirin     REACTION: rash  . Other     Beef-hives   Past Medical History  Diagnosis Date  . Seasonal allergies     takes Claritin daily as needed and uses Flonase daily as needed  . Hyperlipidemia     takes Atorvastatin daily  . BPH (benign prostatic hyperplasia)     takes Flomax  daily  . Hypogonadism male   . Foot drop     both feet  . Numbness     only in 4th and 5th finger on left hand and left leg  . Chronic back pain     DDD  . Diverticulosis   . Urinary frequency     takes Flomax daily  . Nocturia   . Anxiety     takes Valium daily as needed  . GERD (gastroesophageal reflux disease)     takes Omeprazole daily as well as Zantac  . Arthritis     in neck   Past Surgical History  Procedure Laterality Date  . Cervical fusion  2011/2014  . Knee arthroscopy      right 1985; left 1992  . Septoplasty    . Shoulder arthroscopy Left 2015  . Shoulder surgery Right 08  . Colonoscopy    . Back surgery      lumbar fusion   Review of Systems  10 point systems review negative except as above.    Objective:   Physical Exam  BP 120/74 mmHg  Pulse 64  Temp(Src) 97.5 F (36.4 C) (Temporal)  Resp 16  Ht 6' 1.5" (1.867 m)  Wt 202 lb (91.627 kg)  BMI 26.29 kg/m2  SpO2 96%  HEENT - Eac's patent. TM's Nl. EOM's full. PERRLA. NasoOroPharynx  clear. Neck - supple. Nl Thyroid. Carotids 2+ & No bruits, nodes, JVD Chest - Clear equal BS w/o Rales, rhonchi, wheezes. Cor - Nl HS. RRR w/o sig MGR. PP 1(+). No edema. Abd - No palpable organomegaly, masses or tenderness. BS nl. MS- FROM w/o deformities. Muscle power, tone and bulk Nl. Gait Nl. Neuro - No obvious Cr N abnormalities. Sensory, motor and Cerebellar functions appear Nl w/o focal abnormalities. Psyche - Mental status normal & appropriate.  No delusions, ideations or obvious mood abnormalities. Skin - # 3 firm superificially excoriated areas over the L buttock and posterior thigh, each measuring about 15 x 15 mm w/o signs ofcellulitis or lymphangitis.     Assessment & Plan:   1. Insect bite (nonvenomous), left thigh, initial encounter (CODE)  - triamcinolone cream (KENALOG) 0.1 %; Apply 1 application topically 2 (two) times daily. Apply to bug bite 3 to 4 x/daily  Dispense: 45 g; Refill: 1  -  Discussed med and possible SE's . ROV prn.

## 2016-02-04 ENCOUNTER — Other Ambulatory Visit: Payer: Self-pay | Admitting: Internal Medicine

## 2016-02-04 NOTE — Telephone Encounter (Signed)
RX CALLED INTO [facility],

## 2016-02-05 DIAGNOSIS — H9311 Tinnitus, right ear: Secondary | ICD-10-CM | POA: Diagnosis not present

## 2016-02-05 DIAGNOSIS — J310 Chronic rhinitis: Secondary | ICD-10-CM | POA: Diagnosis not present

## 2016-02-05 DIAGNOSIS — H6991 Unspecified Eustachian tube disorder, right ear: Secondary | ICD-10-CM | POA: Diagnosis not present

## 2016-02-15 ENCOUNTER — Telehealth: Payer: Self-pay | Admitting: *Deleted

## 2016-02-15 MED ORDER — PREDNISONE 20 MG PO TABS: ORAL_TABLET | ORAL | Status: AC

## 2016-02-15 NOTE — Telephone Encounter (Signed)
Patient was seen 02/02/16 with concerns about possible "bug bites" on buttocks.  Patient was given Kenalog cream and states the cream is helping with symptoms but rash has not cleared completely.  Patient requesting we send in an additional Rx to help relieve symptoms of swelling and itching.  Per Dr. Idell Pickles orders, and Rx for Prednisone 20 mg was sent into the pharmacy for patient to take in addition to using his Kenalog cream.  Patient expressed understanding.

## 2016-03-07 ENCOUNTER — Other Ambulatory Visit: Payer: Self-pay | Admitting: Internal Medicine

## 2016-03-14 ENCOUNTER — Ambulatory Visit: Payer: Self-pay | Admitting: Internal Medicine

## 2016-03-17 ENCOUNTER — Ambulatory Visit (INDEPENDENT_AMBULATORY_CARE_PROVIDER_SITE_OTHER): Payer: Medicare Other | Admitting: Internal Medicine

## 2016-03-17 ENCOUNTER — Encounter: Payer: Self-pay | Admitting: Internal Medicine

## 2016-03-17 VITALS — BP 116/72 | HR 66 | Temp 98.4°F | Resp 18 | Ht 73.5 in | Wt 200.0 lb

## 2016-03-17 DIAGNOSIS — E349 Endocrine disorder, unspecified: Secondary | ICD-10-CM

## 2016-03-17 DIAGNOSIS — E559 Vitamin D deficiency, unspecified: Secondary | ICD-10-CM | POA: Diagnosis not present

## 2016-03-17 DIAGNOSIS — E782 Mixed hyperlipidemia: Secondary | ICD-10-CM | POA: Diagnosis not present

## 2016-03-17 DIAGNOSIS — I10 Essential (primary) hypertension: Secondary | ICD-10-CM | POA: Diagnosis not present

## 2016-03-17 DIAGNOSIS — H6593 Unspecified nonsuppurative otitis media, bilateral: Secondary | ICD-10-CM | POA: Diagnosis not present

## 2016-03-17 DIAGNOSIS — T148 Other injury of unspecified body region: Secondary | ICD-10-CM

## 2016-03-17 DIAGNOSIS — Z79899 Other long term (current) drug therapy: Secondary | ICD-10-CM | POA: Diagnosis not present

## 2016-03-17 DIAGNOSIS — R7303 Prediabetes: Secondary | ICD-10-CM | POA: Diagnosis not present

## 2016-03-17 DIAGNOSIS — N529 Male erectile dysfunction, unspecified: Secondary | ICD-10-CM

## 2016-03-17 DIAGNOSIS — W57XXXA Bitten or stung by nonvenomous insect and other nonvenomous arthropods, initial encounter: Secondary | ICD-10-CM

## 2016-03-17 DIAGNOSIS — E291 Testicular hypofunction: Secondary | ICD-10-CM | POA: Diagnosis not present

## 2016-03-17 LAB — CBC WITH DIFFERENTIAL/PLATELET
Basophils Absolute: 30 cells/uL (ref 0–200)
Basophils Relative: 1 %
Eosinophils Absolute: 120 cells/uL (ref 15–500)
Eosinophils Relative: 4 %
HCT: 46 % (ref 38.5–50.0)
Hemoglobin: 15.8 g/dL (ref 13.2–17.1)
LYMPHS ABS: 1140 {cells}/uL (ref 850–3900)
Lymphocytes Relative: 38 %
MCH: 33.3 pg — AB (ref 27.0–33.0)
MCHC: 34.3 g/dL (ref 32.0–36.0)
MCV: 97 fL (ref 80.0–100.0)
MONO ABS: 300 {cells}/uL (ref 200–950)
MPV: 10 fL (ref 7.5–12.5)
Monocytes Relative: 10 %
NEUTROS ABS: 1410 {cells}/uL — AB (ref 1500–7800)
NEUTROS PCT: 47 %
PLATELETS: 226 10*3/uL (ref 140–400)
RBC: 4.74 MIL/uL (ref 4.20–5.80)
RDW: 14 % (ref 11.0–15.0)
WBC: 3 10*3/uL — AB (ref 3.8–10.8)

## 2016-03-17 LAB — HEMOGLOBIN A1C
Hgb A1c MFr Bld: 5.4 % (ref <5.7)
MEAN PLASMA GLUCOSE: 108 mg/dL

## 2016-03-17 MED ORDER — FLUTICASONE PROPIONATE 50 MCG/ACT NA SUSP: 2.0000 | Freq: Every day | NASAL | Status: DC

## 2016-03-17 MED ORDER — DOXYCYCLINE HYCLATE 100 MG PO CAPS: 100.0000 mg | ORAL_CAPSULE | Freq: Two times a day (BID) | ORAL | Status: DC

## 2016-03-17 NOTE — Progress Notes (Signed)
Assessment and Plan:  Hypertension:  -Continue medication -monitor blood pressure at home. -Continue DASH diet -Reminder to go to the ER if any CP, SOB, nausea, dizziness, severe HA, changes vision/speech, left arm numbness and tingling and jaw pain.  Cholesterol - Continue diet and exercise -Check cholesterol.   PreDiabetes without complications -Continue diet and exercise.  -Check A1C  Vitamin D Def -check level -continue medications.   Bug bite -doxycycline -cont use of kenalog -cool compresses -avoid scratching  Hypogonadism -testosterone level  ED -viagra samples given  Continue diet and meds as discussed. Further disposition pending results of labs. Discussed med's effects and SE's.    HPI 65 y.o. male  presents for 3 month follow up with hypertension, hyperlipidemia, diabetes and vitamin D deficiency.   His blood pressure has been controlled at home, today their BP is BP: 116/72 mmHg.He does not workout. He denies chest pain, shortness of breath, dizziness.     He is on cholesterol medication and denies myalgias. His cholesterol is at goal. The cholesterol was:  12/09/2015: Cholesterol 161; HDL 45; LDL Cholesterol 92; Triglycerides 119   He has been working on diet and exercise for prediabetes without complications, he is not on bASA, he is on ACE/ARB, and denies  foot ulcerations, hyperglycemia, hypoglycemia , increased appetite, nausea, paresthesia of the feet, polydipsia, polyuria, visual disturbances, vomiting and weight loss. Last A1C was: 12/09/2015: Hgb A1c MFr Bld 5.8*   Patient is on Vitamin D supplement. 09/09/2015: Vit D, 25-Hydroxy 60  Patient reports that he has a bite to his bottom which has not improved since his visit in May where he was given steroid cream and was also given a prednisone taper.  He reports that it is still very itchy.    His back has been doing relatively well.    He reports that his last testosterone injection was 20 days ago.   He is due for a shot today.    He is still having issues with ED.  He does take viagra, but as it is not covered by his insurance he reports that he has a hard time affording it.    He also notes that he has a bug bite to the left buttocks that was seen by Dr. Melford Aase in May. It is still really itchy and he notes that it has not cleared up.  It does not drain anything.  It is sometimes irritating to sit on.     Current Medications:  Current Outpatient Prescriptions on File Prior to Visit  Medication Sig Dispense Refill  . atorvastatin (LIPITOR) 40 MG tablet Take 40 mg by mouth 3 (three) times a week. Tuesday, Thursday, Saturday    . atorvastatin (LIPITOR) 80 MG tablet TAKE 1 TABLET (80 MG TOTAL) BY MOUTH DAILY. FOR CHOLESTEROL 30 tablet 3  . Cholecalciferol (VITAMIN D) 2000 UNITS tablet Take 4,000 Units by mouth daily.     . diclofenac (VOLTAREN) 75 MG EC tablet TAKE 1 TABLET BY MOUTH TWICE A DAY 180 tablet 1  . Flaxseed, Linseed, (FLAX SEED OIL) 1000 MG CAPS Take 1 capsule (1,000 mg total) by mouth 2 (two) times daily.  0  . fluticasone (FLONASE) 50 MCG/ACT nasal spray Place 2 sprays into both nostrils daily. 16 g 3  . loratadine (CLARITIN) 10 MG tablet Take 1 tablet (10 mg total) by mouth as needed for allergies. 90 tablet 4  . Magnesium 250 MG TABS Take 250 mg by mouth daily.    Marland Kitchen oxyCODONE (ROXICODONE)  15 MG immediate release tablet Take 15 mg by mouth every 6 (six) hours as needed.  0  . ranitidine (ZANTAC) 300 MG tablet TAKE 1 TABLET BY MOUTH TWICE A DAY 180 tablet 1  . tamsulosin (FLOMAX) 0.4 MG CAPS capsule Take 2 capsules daily for prostate 180 capsule 1  . testosterone cypionate (DEPOTESTOSTERONE CYPIONATE) 200 MG/ML injection INJECT 2 MLS INTRAMUSCULARLY EVERY 2 WEEKS 10 mL 1  . triamcinolone cream (KENALOG) 0.1 % Apply 1 application topically 2 (two) times daily. Apply to bug bite 3 to 4 x/daily 45 g 1  . vitamin B-12 (CYANOCOBALAMIN) 1000 MCG tablet Take 1,000 mcg by mouth daily.      No current facility-administered medications on file prior to visit.   Medical History:  Past Medical History  Diagnosis Date  . Seasonal allergies     takes Claritin daily as needed and uses Flonase daily as needed  . Hyperlipidemia     takes Atorvastatin daily  . BPH (benign prostatic hyperplasia)     takes Flomax daily  . Hypogonadism male   . Foot drop     both feet  . Numbness     only in 4th and 5th finger on left hand and left leg  . Chronic back pain     DDD  . Diverticulosis   . Urinary frequency     takes Flomax daily  . Nocturia   . Anxiety     takes Valium daily as needed  . GERD (gastroesophageal reflux disease)     takes Omeprazole daily as well as Zantac  . Arthritis     in neck   Allergies:  Allergies  Allergen Reactions  . Salicylates Rash  . Aspirin     REACTION: rash  . Other     Beef-hives     Review of Systems:  Review of Systems  Constitutional: Negative for fever, chills and malaise/fatigue.  HENT: Negative for congestion, ear pain and sore throat.   Eyes: Negative.   Respiratory: Negative for cough, shortness of breath and wheezing.   Cardiovascular: Negative for chest pain, palpitations and leg swelling.  Gastrointestinal: Negative for heartburn, abdominal pain, diarrhea, constipation, blood in stool and melena.  Genitourinary: Negative.   Skin: Negative.   Neurological: Negative for dizziness, sensory change, loss of consciousness and headaches.  Psychiatric/Behavioral: Negative for depression. The patient is not nervous/anxious and does not have insomnia.     Family history- Review and unchanged  Social history- Review and unchanged  Physical Exam: BP 116/72 mmHg  Pulse 66  Temp(Src) 98.4 F (36.9 C) (Temporal)  Resp 18  Ht 6' 1.5" (1.867 m)  Wt 200 lb (90.719 kg)  BMI 26.03 kg/m2 Wt Readings from Last 3 Encounters:  03/17/16 200 lb (90.719 kg)  02/02/16 202 lb (91.627 kg)  12/16/15 202 lb (91.627 kg)   General  Appearance: Well nourished well developed, non-toxic appearing, in no apparent distress. Eyes: PERRLA, EOMs, conjunctiva no swelling or erythema ENT/Mouth: Ear canals clear with no erythema, swelling, or discharge.  TMs normal bilaterally, oropharynx clear, moist, with no exudate.   Neck: Supple, thyroid normal, no JVD, no cervical adenopathy.  Respiratory: Respiratory effort normal, breath sounds clear A&P, no wheeze, rhonchi or rales noted.  No retractions, no accessory muscle usage Cardio: RRR with no MRGs. No noted edema.  Abdomen: Soft, + BS.  Non tender, no guarding, rebound, hernias, masses. Musculoskeletal: Full ROM, 5/5 strength, Normal gait Skin: Warm, dry without rashes, lesions, ecchymosis.  Neuro:  Awake and oriented X 3, Cranial nerves intact. No cerebellar symptoms.  Psych: normal affect, Insight and Judgment appropriate.    Starlyn Skeans, PA-C 10:34 AM [facility]

## 2016-03-18 ENCOUNTER — Other Ambulatory Visit: Payer: Self-pay | Admitting: Internal Medicine

## 2016-03-18 LAB — TESTOSTERONE: TESTOSTERONE: 236 ng/dL — AB (ref 250–827)

## 2016-03-18 LAB — TSH: TSH: 2.81 m[IU]/L (ref 0.40–4.50)

## 2016-03-21 LAB — LIPID PANEL
CHOLESTEROL: 166 mg/dL (ref 125–200)
HDL: 48 mg/dL (ref >=40)
LDL Cholesterol: 94 mg/dL (ref <130)
TRIGLYCERIDES: 121 mg/dL (ref <150)
Total CHOL/HDL Ratio: 3.5 Ratio (ref <=5.0)
VLDL: 24 mg/dL (ref <30)

## 2016-03-21 LAB — HEPATIC FUNCTION PANEL
ALBUMIN: 3.8 g/dL (ref 3.6–5.1)
ALT: 22 U/L (ref 9–46)
AST: 22 U/L (ref 10–35)
Alkaline Phosphatase: 61 U/L (ref 40–115)
BILIRUBIN TOTAL: 0.6 mg/dL (ref 0.2–1.2)
Bilirubin, Direct: 0.1 mg/dL (ref <=0.2)
Indirect Bilirubin: 0.5 mg/dL (ref 0.2–1.2)
Total Protein: 7.1 g/dL (ref 6.1–8.1)

## 2016-03-21 LAB — BASIC METABOLIC PANEL WITH GFR
BUN: 13 mg/dL (ref 7–25)
CO2: 25 mmol/L (ref 20–31)
CREATININE: 1.12 mg/dL (ref 0.70–1.25)
Calcium: 9.6 mg/dL (ref 8.6–10.3)
Chloride: 103 mmol/L (ref 98–110)
GFR, EST AFRICAN AMERICAN: 79 mL/min (ref >=60)
GFR, Est Non African American: 69 mL/min (ref >=60)
Glucose, Bld: 85 mg/dL (ref 65–99)
Potassium: 4.5 mmol/L (ref 3.5–5.3)
Sodium: 140 mmol/L (ref 135–146)

## 2016-04-02 ENCOUNTER — Other Ambulatory Visit: Payer: Self-pay | Admitting: Internal Medicine

## 2016-04-15 ENCOUNTER — Other Ambulatory Visit: Payer: Self-pay | Admitting: Internal Medicine

## 2016-04-15 DIAGNOSIS — N320 Bladder-neck obstruction: Secondary | ICD-10-CM

## 2016-05-27 ENCOUNTER — Encounter: Payer: Self-pay | Admitting: Internal Medicine

## 2016-06-15 ENCOUNTER — Encounter: Payer: Self-pay | Admitting: Internal Medicine

## 2016-06-20 ENCOUNTER — Encounter: Payer: Self-pay | Admitting: Internal Medicine

## 2016-06-20 ENCOUNTER — Ambulatory Visit (INDEPENDENT_AMBULATORY_CARE_PROVIDER_SITE_OTHER): Payer: Medicare Other | Admitting: Internal Medicine

## 2016-06-20 VITALS — BP 110/68 | HR 66 | Temp 98.6°F | Resp 18 | Ht 73.5 in | Wt 205.0 lb

## 2016-06-20 DIAGNOSIS — Z125 Encounter for screening for malignant neoplasm of prostate: Secondary | ICD-10-CM | POA: Diagnosis not present

## 2016-06-20 DIAGNOSIS — Z136 Encounter for screening for cardiovascular disorders: Secondary | ICD-10-CM

## 2016-06-20 DIAGNOSIS — I10 Essential (primary) hypertension: Secondary | ICD-10-CM | POA: Diagnosis not present

## 2016-06-20 DIAGNOSIS — E782 Mixed hyperlipidemia: Secondary | ICD-10-CM | POA: Diagnosis not present

## 2016-06-20 DIAGNOSIS — E349 Endocrine disorder, unspecified: Secondary | ICD-10-CM | POA: Diagnosis not present

## 2016-06-20 DIAGNOSIS — E559 Vitamin D deficiency, unspecified: Secondary | ICD-10-CM

## 2016-06-20 DIAGNOSIS — Z0001 Encounter for general adult medical examination with abnormal findings: Secondary | ICD-10-CM | POA: Diagnosis not present

## 2016-06-20 DIAGNOSIS — Z79899 Other long term (current) drug therapy: Secondary | ICD-10-CM

## 2016-06-20 DIAGNOSIS — Z23 Encounter for immunization: Secondary | ICD-10-CM

## 2016-06-20 DIAGNOSIS — Z1212 Encounter for screening for malignant neoplasm of rectum: Secondary | ICD-10-CM

## 2016-06-20 DIAGNOSIS — R7303 Prediabetes: Secondary | ICD-10-CM

## 2016-06-20 LAB — CBC WITH DIFFERENTIAL/PLATELET
BASOS ABS: 0 {cells}/uL (ref 0–200)
BASOS PCT: 0 %
EOS ABS: 225 {cells}/uL (ref 15–500)
Eosinophils Relative: 5 %
HEMATOCRIT: 47.9 % (ref 38.5–50.0)
Hemoglobin: 16.3 g/dL (ref 13.2–17.1)
LYMPHS PCT: 21 %
Lymphs Abs: 945 cells/uL (ref 850–3900)
MCH: 33.3 pg — AB (ref 27.0–33.0)
MCHC: 34 g/dL (ref 32.0–36.0)
MCV: 97.8 fL (ref 80.0–100.0)
MONO ABS: 450 {cells}/uL (ref 200–950)
MONOS PCT: 10 %
MPV: 10.6 fL (ref 7.5–12.5)
NEUTROS PCT: 64 %
Neutro Abs: 2880 cells/uL (ref 1500–7800)
PLATELETS: 215 10*3/uL (ref 140–400)
RBC: 4.9 MIL/uL (ref 4.20–5.80)
RDW: 13.6 % (ref 11.0–15.0)
WBC: 4.5 10*3/uL (ref 3.8–10.8)

## 2016-06-20 LAB — HEPATIC FUNCTION PANEL
ALBUMIN: 4.1 g/dL (ref 3.6–5.1)
ALK PHOS: 65 U/L (ref 40–115)
ALT: 23 U/L (ref 9–46)
AST: 20 U/L (ref 10–35)
BILIRUBIN TOTAL: 0.5 mg/dL (ref 0.2–1.2)
Bilirubin, Direct: 0.1 mg/dL (ref <=0.2)
Indirect Bilirubin: 0.4 mg/dL (ref 0.2–1.2)
TOTAL PROTEIN: 7.2 g/dL (ref 6.1–8.1)

## 2016-06-20 LAB — TSH: TSH: 3.29 m[IU]/L (ref 0.40–4.50)

## 2016-06-20 LAB — BASIC METABOLIC PANEL WITH GFR
BUN: 12 mg/dL (ref 7–25)
CO2: 27 mmol/L (ref 20–31)
CREATININE: 1.19 mg/dL (ref 0.70–1.25)
Calcium: 9.6 mg/dL (ref 8.6–10.3)
Chloride: 101 mmol/L (ref 98–110)
GFR, EST AFRICAN AMERICAN: 74 mL/min (ref >=60)
GFR, Est Non African American: 64 mL/min (ref >=60)
GLUCOSE: 86 mg/dL (ref 65–99)
POTASSIUM: 4.1 mmol/L (ref 3.5–5.3)
Sodium: 138 mmol/L (ref 135–146)

## 2016-06-20 LAB — LIPID PANEL
CHOL/HDL RATIO: 3 ratio (ref <=5.0)
Cholesterol: 128 mg/dL (ref 125–200)
HDL: 43 mg/dL (ref >=40)
LDL CALC: 65 mg/dL (ref <130)
Triglycerides: 102 mg/dL (ref <150)
VLDL: 20 mg/dL (ref <30)

## 2016-06-20 LAB — MAGNESIUM: MAGNESIUM: 1.9 mg/dL (ref 1.5–2.5)

## 2016-06-20 LAB — PSA: PSA: 1.1 ng/mL (ref <=4.0)

## 2016-06-20 NOTE — Progress Notes (Signed)
Complete Physical  Assessment and Plan:   1. Essential hypertension -cont meds -well controlled - Urinalysis, Routine w reflex microscopic (not at [facility]) - Microalbumin / creatinine urine ratio - EKG 12-Lead - TSH  2. Mixed hyperlipidemia -cont meds -diet and exercise - Lipid panel  3. Prediabetes -cont diet and exercise - Hemoglobin A1c - Insulin, random  4. Vitamin D deficiency -cont Vit D supplement - VITAMIN D 25 Hydroxy (Vit-D Deficiency, Fractures)  5. Testosterone deficiency  - Testosterone  6. Medication management -cont quarterly lab monitoring  7. Encounter for general adult medical examination with abnormal findings  - CBC with Differential/Platelet - BASIC METABOLIC PANEL WITH GFR - Hepatic function panel - Magnesium  8. Screening for rectal cancer  - POC Hemoccult Bld/Stl (3-Cd Home Screen); Future  9. Screening for prostate cancer  - PSA  10. Need for prophylactic vaccination and inoculation against influenza  - Flu vaccine HIGH DOSE PF (Fluzone High dose)  11.  GERD -nexium x 2 weeks -cont ranitidine BID prn -patient to avoid trigger foods -list of trigger foods were given to patient.    Discussed med's effects and SE's. Screening labs and tests as requested with regular follow-up as recommended.  HPI Patient presents for a complete physical.   His blood pressure has been controlled at home, today their BP is BP: 110/68 He does not workout. He denies chest pain, shortness of breath, dizziness.   He is on cholesterol medication and denies myalgias. His cholesterol is at goal. The cholesterol last visit was:   Lab Results  Component Value Date   CHOL 166 03/17/2016   HDL 48 03/17/2016   LDLCALC 94 03/17/2016   TRIG 121 03/17/2016   CHOLHDL 3.5 03/17/2016    He has been working on diet and exercise for prediabetes, he is on bASA, he is on ACE/ARB and denies foot ulcerations, hyperglycemia, hypoglycemia , increased appetite,  nausea, paresthesia of the feet, polydipsia, polyuria, visual disturbances, vomiting and weight loss. Last A1C in the office was:  Lab Results  Component Value Date   HGBA1C 5.4 03/17/2016    Patient is on Vitamin D supplement.   Lab Results  Component Value Date   VD25OH 60 09/09/2015      Last PSA was: Lab Results  Component Value Date   PSA 2.31 05/28/2015  .  Denies BPH symptoms daytime frequency, double voiding, dysuria, hematuria, hesitancy, incontinence, intermittency, nocturia, sensation of incomplete bladder emptying, suprapubic pain, urgency or weak urinary stream.  He reports that he feels like he is getting worsening heart burn.  It is worse at nighttime.  He reports that he has it when he lays down.  He is still taking diclofenac daily.     Current Medications:  Current Outpatient Prescriptions on File Prior to Visit  Medication Sig Dispense Refill  . atorvastatin (LIPITOR) 40 MG tablet Take 40 mg by mouth 3 (three) times a week. Tuesday, Thursday, Saturday    . atorvastatin (LIPITOR) 80 MG tablet TAKE 1 TABLET (80 MG TOTAL) BY MOUTH DAILY. FOR CHOLESTEROL 30 tablet 3  . Cholecalciferol (VITAMIN D) 2000 UNITS tablet Take 4,000 Units by mouth daily.     . diclofenac (VOLTAREN) 75 MG EC tablet TAKE 1 TABLET BY MOUTH TWICE A DAY 180 tablet 1  . finasteride (PROSCAR) 5 MG tablet TAKE 1 TABLET DAILY FOR PROSTATE 90 tablet 1  . Flaxseed, Linseed, (FLAX SEED OIL) 1000 MG CAPS Take 1 capsule (1,000 mg total) by mouth 2 (two)  times daily.  0  . fluticasone (FLONASE) 50 MCG/ACT nasal spray Place 2 sprays into both nostrils daily. 16 g 3  . loratadine (CLARITIN) 10 MG tablet Take 1 tablet (10 mg total) by mouth as needed for allergies. 90 tablet 4  . Magnesium 250 MG TABS Take 250 mg by mouth daily.    Marland Kitchen oxyCODONE (ROXICODONE) 15 MG immediate release tablet Take 15 mg by mouth every 6 (six) hours as needed.  0  . ranitidine (ZANTAC) 300 MG tablet TAKE 1 TABLET BY MOUTH TWICE A DAY  180 tablet 1  . tamsulosin (FLOMAX) 0.4 MG CAPS capsule TAKE 2 CAPSULES DAILY FOR PROSTATE 180 capsule 1  . tamsulosin (FLOMAX) 0.4 MG CAPS capsule TAKE 2 CAPSULES DAILY FOR PROSTATE 180 capsule 0  . testosterone cypionate (DEPOTESTOSTERONE CYPIONATE) 200 MG/ML injection INJECT 2 MLS INTRAMUSCULARLY EVERY 2 WEEKS 10 mL 1  . triamcinolone cream (KENALOG) 0.1 % Apply 1 application topically 2 (two) times daily. Apply to bug bite 3 to 4 x/daily 45 g 1  . vitamin B-12 (CYANOCOBALAMIN) 1000 MCG tablet Take 1,000 mcg by mouth daily.     No current facility-administered medications on file prior to visit.     Health Maintenance:  Immunization History  Administered Date(s) Administered  . Influenza Split 06/18/2015  . Influenza-Unspecified 08/17/2014  . PPD Test 05/27/2014  . Pneumococcal Conjugate-13 12/09/2015  . Pneumococcal Polysaccharide-23 04/08/2013  . Td 09/13/2003  . Tdap 05/27/2014    Tetanus:  2015 Pneumovax: 2014 Prevnar 13:  2017 Flu vaccine: 2017 Zostavax: Declined cost   Colonoscopy: 2009  Patient Care Team: Unk Pinto, MD as PCP - General (Internal Medicine) Inda Castle, MD as Consulting Physician (Gastroenterology) Kathie Rhodes, MD as Consulting Physician (Urology) Leeroy Cha, MD as Consulting Physician (Neurosurgery) Barbaraann Cao, OD as Referring Physician (Optometry) Druscilla Brownie, MD as Consulting Physician (Dermatology)  Allergies:  Allergies  Allergen Reactions  . Salicylates Rash  . Aspirin     REACTION: rash  . Other     Beef-hives    Medical History:  Past Medical History:  Diagnosis Date  . Anxiety    takes Valium daily as needed  . Arthritis    in neck  . BPH (benign prostatic hyperplasia)    takes Flomax daily  . Chronic back pain    DDD  . Diverticulosis   . Foot drop    both feet  . GERD (gastroesophageal reflux disease)    takes Omeprazole daily as well as Zantac  . Hyperlipidemia    takes Atorvastatin  daily  . Hypogonadism male   . Nocturia   . Numbness    only in 4th and 5th finger on left hand and left leg  . Seasonal allergies    takes Claritin daily as needed and uses Flonase daily as needed  . Urinary frequency    takes Flomax daily    Surgical History:  Past Surgical History:  Procedure Laterality Date  . BACK SURGERY     lumbar fusion  . CERVICAL FUSION  2011/2014  . COLONOSCOPY    . KNEE ARTHROSCOPY     right 1985; left 1992  . SEPTOPLASTY    . SHOULDER ARTHROSCOPY Left 2015  . SHOULDER SURGERY Right 08    Family History:  Family History  Problem Relation Age of Onset  . Colon cancer Neg Hx   . Stomach cancer Neg Hx   . Leukemia Mother   . Diabetes Mother   . Hypertension  Father   . Diabetes Father     Social History:   Social History  Substance Use Topics  . Smoking status: Former Smoker    Quit date: 05/28/1995  . Smokeless tobacco: Not on file     Comment: quit smoking >20+yrs ago  . Alcohol use 0.0 oz/week     Comment: rarely    Review of Systems:  Review of Systems  Constitutional: Negative for chills, fever and malaise/fatigue.  HENT: Negative for congestion, ear pain and sore throat.   Eyes: Negative.   Respiratory: Negative for cough, shortness of breath and wheezing.   Cardiovascular: Negative for chest pain, palpitations and leg swelling.  Gastrointestinal: Positive for heartburn. Negative for abdominal pain, blood in stool, constipation, diarrhea and melena.  Genitourinary: Negative.   Skin: Negative.   Neurological: Negative for dizziness, sensory change, loss of consciousness and headaches.  Psychiatric/Behavioral: Negative for depression. The patient is not nervous/anxious and does not have insomnia.     Physical Exam: Estimated body mass index is 26.68 kg/m as calculated from the following:   Height as of this encounter: 6' 1.5" (1.867 m).   Weight as of this encounter: 205 lb (93 kg). BP 110/68   Pulse 66   Temp 98.6 F (37  C) (Temporal)   Resp 18   Ht 6' 1.5" (1.867 m)   Wt 205 lb (93 kg)   BMI 26.68 kg/m   General Appearance: Well nourished, in no apparent distress.  Eyes: PERRLA, EOMs, conjunctiva no swelling or erythema ENT/Mouth: Ear canals clear bilaterally with no erythema, swelling, discharge.  TMs normal bilaterally with no erythema, bulging, or retractions.  Oropharynx clear and moist with no exudate, swelling, or erythema.  Dentition normal.   Neck: Supple, thyroid normal. No bruits, JVD, cervical adenopathy Respiratory: Respiratory effort normal, BS equal bilaterally without rales, rhonchi, wheezing or stridor.  Cardio: RRR without murmurs, rubs or gallops. Brisk peripheral pulses without edema.  Chest: symmetric, with normal excursions Abdomen: Soft, nontender, no guarding, rebound, hernias, masses, or organomegaly. Genitourinary:  Musculoskeletal: Full ROM all peripheral extremities,5/5 strength, and normal gait.  Skin: Warm, dry without rashes, lesions, ecchymosis. Neuro: A&Ox3, Cranial nerves intact, reflexes equal bilaterally. Normal muscle tone, no cerebellar symptoms. Sensation intact.  Psych: Normal affect, Insight and Judgment appropriate.   EKG: Unchanged from 2016  Over 40 minutes of exam, counseling, chart review and critical decision making was performed  Starlyn Skeans 9:16 AM [facility]

## 2016-06-20 NOTE — Patient Instructions (Signed)
Please stop the diclofenac.  This is your arthritis medication.  Please start taking the prilosec once daily for 2 weeks and then go back to as needed.  Keep taking the ranitidine(zantac) twice daily.  Please stay upright.     Food Choices for Gastroesophageal Reflux Disease, Adult When you have gastroesophageal reflux disease (GERD), the foods you eat and your eating habits are very important. Choosing the right foods can help ease the discomfort of GERD. WHAT GENERAL GUIDELINES DO I NEED TO FOLLOW?  Choose fruits, vegetables, whole grains, low-fat dairy products, and low-fat meat, fish, and poultry.  Limit fats such as oils, salad dressings, butter, nuts, and avocado.  Keep a food diary to identify foods that cause symptoms.  Avoid foods that cause reflux. These may be different for different people.  Eat frequent small meals instead of three large meals each day.  Eat your meals slowly, in a relaxed setting.  Limit fried foods.  Cook foods using methods other than frying.  Avoid drinking alcohol.  Avoid drinking large amounts of liquids with your meals.  Avoid bending over or lying down until 2-3 hours after eating. WHAT FOODS ARE NOT RECOMMENDED? The following are some foods and drinks that may worsen your symptoms: Vegetables Tomatoes. Tomato juice. Tomato and spaghetti sauce. Chili peppers. Onion and garlic. Horseradish. Fruits Oranges, grapefruit, and lemon (fruit and juice). Meats High-fat meats, fish, and poultry. This includes hot dogs, ribs, ham, sausage, salami, and bacon. Dairy Whole milk and chocolate milk. Sour cream. Cream. Butter. Ice cream. Cream cheese.  Beverages Coffee and tea, with or without caffeine. Carbonated beverages or energy drinks. Condiments Hot sauce. Barbecue sauce.  Sweets/Desserts Chocolate and cocoa. Donuts. Peppermint and spearmint. Fats and Oils High-fat foods, including [place] fries and potato chips. Other Vinegar. Strong  spices, such as black pepper, white pepper, red pepper, cayenne, curry powder, cloves, ginger, and chili powder. The items listed above may not be a complete list of foods and beverages to avoid. Contact your dietitian for more information.   This information is not intended to replace advice given to you by your health care provider. Make sure you discuss any questions you have with your health care provider.   Document Released: 08/29/2005 Document Revised: 09/19/2014 Document Reviewed: 07/03/2013 Elsevier Interactive Patient Education [employer].

## 2016-06-21 LAB — INSULIN, RANDOM: Insulin: 10.6 u[IU]/mL (ref 2.0–19.6)

## 2016-06-21 LAB — TESTOSTERONE: TESTOSTERONE: 1334 ng/dL — AB (ref 250–827)

## 2016-06-21 LAB — URINALYSIS, ROUTINE W REFLEX MICROSCOPIC
Bilirubin Urine: NEGATIVE
GLUCOSE, UA: NEGATIVE
Hgb urine dipstick: NEGATIVE
Ketones, ur: NEGATIVE
LEUKOCYTES UA: NEGATIVE
Nitrite: NEGATIVE
PH: 7.5 (ref 5.0–8.0)
PROTEIN: NEGATIVE
Specific Gravity, Urine: 1.018 (ref 1.001–1.035)

## 2016-06-21 LAB — MICROALBUMIN / CREATININE URINE RATIO
Creatinine, Urine: 177 mg/dL (ref 20–370)
MICROALB/CREAT RATIO: 2 ug/mg{creat} (ref <30)
Microalb, Ur: 0.3 mg/dL

## 2016-06-21 LAB — HEMOGLOBIN A1C
HEMOGLOBIN A1C: 5.4 % (ref <5.7)
MEAN PLASMA GLUCOSE: 108 mg/dL

## 2016-06-21 LAB — VITAMIN D 25 HYDROXY (VIT D DEFICIENCY, FRACTURES): VIT D 25 HYDROXY: 79 ng/mL (ref 30–100)

## 2016-06-22 ENCOUNTER — Encounter: Payer: Self-pay | Admitting: *Deleted

## 2016-07-07 ENCOUNTER — Ambulatory Visit (INDEPENDENT_AMBULATORY_CARE_PROVIDER_SITE_OTHER): Payer: Medicare Other | Admitting: Internal Medicine

## 2016-07-07 ENCOUNTER — Encounter: Payer: Self-pay | Admitting: Internal Medicine

## 2016-07-07 ENCOUNTER — Other Ambulatory Visit: Payer: Self-pay | Admitting: Internal Medicine

## 2016-07-07 VITALS — BP 150/98 | HR 104 | Temp 98.4°F | Resp 18 | Ht 73.5 in | Wt 206.0 lb

## 2016-07-07 DIAGNOSIS — R369 Urethral discharge, unspecified: Secondary | ICD-10-CM | POA: Diagnosis not present

## 2016-07-07 MED ORDER — OMEPRAZOLE 20 MG PO CPDR: 20.0000 mg | DELAYED_RELEASE_CAPSULE | Freq: Every day | ORAL | 1 refills | Status: DC

## 2016-07-07 MED ORDER — SILDENAFIL CITRATE 100 MG PO TABS: 100.0000 mg | ORAL_TABLET | ORAL | 1 refills | Status: AC | PRN

## 2016-07-07 NOTE — Progress Notes (Signed)
   Subjective:    Patient ID: Jacob Larsen, male    DOB: [birth year], 65 y.o.   MRN: [id number]  HPI  Patient presents to the office for evaluation of a penile discharge.  He reports that he had sex with his partner on Sunday and then he had some clear discharge from his penis on Monday and a small amount on Tuesday.  He does use viagra.  He has no new sexual contacts.  He reports that he does not have an ejaculate.  He reports that he has seen a urologist.  He reports that he has not seen them recently.  He reports that he is not having urinary urgency, frequency, suprapubic pain, testicular pain, urinary odor, concentrated urine, or blood in his urine.     Review of Systems  Genitourinary: Positive for discharge. Negative for decreased urine volume, difficulty urinating, enuresis, frequency, hematuria, penile pain, penile swelling, scrotal swelling, testicular pain and urgency.       Objective:   Physical Exam  Constitutional: He is oriented to person, place, and time. He appears well-developed and well-nourished. No distress.  HENT:  Head: Normocephalic.  Mouth/Throat: Oropharynx is clear and moist. No oropharyngeal exudate.  Eyes: Conjunctivae are normal. No scleral icterus.  Neck: Normal range of motion. Neck supple. No JVD present. No thyromegaly present.  Cardiovascular: Normal rate, regular rhythm, normal heart sounds and intact distal pulses.  Exam reveals no gallop and no friction rub.   No murmur heard. Pulmonary/Chest: Effort normal and breath sounds normal. No respiratory distress. He has no wheezes. He has no rales. He exhibits no tenderness.  Abdominal: Soft. Bowel sounds are normal. He exhibits no distension and no mass. There is no tenderness. There is no rebound and no guarding.  Musculoskeletal: Normal range of motion.  Lymphadenopathy:    He has no cervical adenopathy.  Neurological: He is alert and oriented to person, place, and time. No cranial nerve deficit.  Coordination normal.  Skin: Skin is warm and dry. No rash noted. He is not diaphoretic.  Psychiatric: He has a normal mood and affect. His behavior is normal. Judgment and thought content normal.  Nursing note and vitals reviewed.  Vitals:   07/07/16 1119  BP: (!) 150/98  Pulse: (!) 104  Resp: 18  Temp: 98.4 F (36.9 C)          Assessment & Plan:    1. Penile discharge -think that this is likely secondary to flomax side effects vs. Wet dreams -patient does not sound consistent with prostatitis, epidydimitis or UTI - Urinalysis, Routine w reflex microscopic (not at [facility]) - Urine culture - GC probe amplification, urine

## 2016-07-08 LAB — URINALYSIS, ROUTINE W REFLEX MICROSCOPIC
Bilirubin Urine: NEGATIVE
GLUCOSE, UA: NEGATIVE
HGB URINE DIPSTICK: NEGATIVE
Ketones, ur: NEGATIVE
LEUKOCYTES UA: NEGATIVE
NITRITE: NEGATIVE
PROTEIN: NEGATIVE
Specific Gravity, Urine: 1.016 (ref 1.001–1.035)
pH: 7 (ref 5.0–8.0)

## 2016-07-08 LAB — GC/CHLAMYDIA PROBE AMP
CT PROBE, AMP APTIMA: NOT DETECTED
GC PROBE AMP APTIMA: NOT DETECTED

## 2016-07-08 LAB — URINE CULTURE: ORGANISM ID, BACTERIA: NO GROWTH

## 2016-07-08 LAB — NEISSERIA GONORRHOEAE, PROBE AMP: GC PROBE AMP APTIMA: NOT DETECTED

## 2016-07-14 DIAGNOSIS — N5201 Erectile dysfunction due to arterial insufficiency: Secondary | ICD-10-CM | POA: Diagnosis not present

## 2016-07-14 DIAGNOSIS — N401 Enlarged prostate with lower urinary tract symptoms: Secondary | ICD-10-CM | POA: Diagnosis not present

## 2016-07-14 DIAGNOSIS — R351 Nocturia: Secondary | ICD-10-CM | POA: Diagnosis not present

## 2016-07-18 ENCOUNTER — Other Ambulatory Visit: Payer: Self-pay | Admitting: Internal Medicine

## 2016-07-29 DIAGNOSIS — M5416 Radiculopathy, lumbar region: Secondary | ICD-10-CM | POA: Diagnosis not present

## 2016-08-29 ENCOUNTER — Other Ambulatory Visit: Payer: Self-pay | Admitting: *Deleted

## 2016-08-29 ENCOUNTER — Other Ambulatory Visit: Payer: Self-pay | Admitting: Internal Medicine

## 2016-08-29 MED ORDER — OMEPRAZOLE 20 MG PO CPDR: 20.0000 mg | DELAYED_RELEASE_CAPSULE | Freq: Every day | ORAL | 2 refills | Status: DC

## 2016-09-12 ENCOUNTER — Other Ambulatory Visit: Payer: Self-pay | Admitting: Physician Assistant

## 2016-09-12 NOTE — Telephone Encounter (Signed)
P[lease call Depo Test 

## 2016-09-19 ENCOUNTER — Other Ambulatory Visit: Payer: Self-pay | Admitting: Internal Medicine

## 2016-09-30 ENCOUNTER — Other Ambulatory Visit: Payer: Self-pay | Admitting: Internal Medicine

## 2016-09-30 DIAGNOSIS — H6593 Unspecified nonsuppurative otitis media, bilateral: Secondary | ICD-10-CM

## 2016-10-04 DIAGNOSIS — M5416 Radiculopathy, lumbar region: Secondary | ICD-10-CM | POA: Diagnosis not present

## 2016-10-04 DIAGNOSIS — Z6826 Body mass index (BMI) 26.0-26.9, adult: Secondary | ICD-10-CM | POA: Diagnosis not present

## 2016-10-04 DIAGNOSIS — R030 Elevated blood-pressure reading, without diagnosis of hypertension: Secondary | ICD-10-CM | POA: Diagnosis not present

## 2016-10-11 ENCOUNTER — Other Ambulatory Visit: Payer: Self-pay | Admitting: Internal Medicine

## 2016-10-25 DIAGNOSIS — M5136 Other intervertebral disc degeneration, lumbar region: Secondary | ICD-10-CM | POA: Diagnosis not present

## 2016-10-27 ENCOUNTER — Other Ambulatory Visit: Payer: Self-pay | Admitting: Neurosurgery

## 2016-10-27 DIAGNOSIS — M5126 Other intervertebral disc displacement, lumbar region: Secondary | ICD-10-CM | POA: Insufficient documentation

## 2016-10-27 DIAGNOSIS — M5136 Other intervertebral disc degeneration, lumbar region: Secondary | ICD-10-CM | POA: Diagnosis not present

## 2016-11-03 ENCOUNTER — Other Ambulatory Visit: Payer: Self-pay | Admitting: Neurosurgery

## 2016-11-03 DIAGNOSIS — M5126 Other intervertebral disc displacement, lumbar region: Secondary | ICD-10-CM

## 2016-11-08 ENCOUNTER — Ambulatory Visit
Admission: RE | Admit: 2016-11-08 | Discharge: 2016-11-08 | Disposition: A | Discharge status: Home / Self Care | Payer: Medicare Other | Source: Ambulatory Visit | Attending: Neurosurgery | Admitting: Neurosurgery

## 2016-11-08 DIAGNOSIS — M5126 Other intervertebral disc displacement, lumbar region: Secondary | ICD-10-CM

## 2016-11-08 MED ORDER — METHYLPREDNISOLONE ACETATE 40 MG/ML INJ SUSP (RADIOLOG
120.0000 mg | Freq: Once | INTRAMUSCULAR | Status: AC
  Administered 2016-11-08: 120 mg via EPIDURAL

## 2016-11-08 MED ORDER — IOPAMIDOL (ISOVUE-M 200) INJECTION 41%
1.0000 mL | Freq: Once | INTRAMUSCULAR | Status: AC
  Administered 2016-11-08: 1 mL via EPIDURAL

## 2016-11-08 NOTE — Discharge Instructions (Signed)

## 2016-11-14 ENCOUNTER — Encounter (HOSPITAL_COMMUNITY): Admission: RE | Payer: Self-pay | Source: Ambulatory Visit

## 2016-11-14 ENCOUNTER — Inpatient Hospital Stay (HOSPITAL_COMMUNITY): Admission: RE | Admit: 2016-11-14 | Payer: 59 | Source: Ambulatory Visit | Admitting: Neurosurgery

## 2016-11-14 SURGERY — POSTERIOR LUMBAR FUSION 1 LEVEL
Anesthesia: General | Site: Back

## 2016-11-23 DIAGNOSIS — R351 Nocturia: Secondary | ICD-10-CM | POA: Diagnosis not present

## 2016-11-23 DIAGNOSIS — N401 Enlarged prostate with lower urinary tract symptoms: Secondary | ICD-10-CM | POA: Diagnosis not present

## 2016-11-23 DIAGNOSIS — N5201 Erectile dysfunction due to arterial insufficiency: Secondary | ICD-10-CM | POA: Diagnosis not present

## 2016-11-30 DIAGNOSIS — H52223 Regular astigmatism, bilateral: Secondary | ICD-10-CM | POA: Diagnosis not present

## 2016-11-30 DIAGNOSIS — H5203 Hypermetropia, bilateral: Secondary | ICD-10-CM | POA: Diagnosis not present

## 2016-11-30 DIAGNOSIS — H2513 Age-related nuclear cataract, bilateral: Secondary | ICD-10-CM | POA: Diagnosis not present

## 2016-11-30 DIAGNOSIS — H524 Presbyopia: Secondary | ICD-10-CM | POA: Diagnosis not present

## 2016-12-01 ENCOUNTER — Other Ambulatory Visit: Payer: Self-pay | Admitting: Neurosurgery

## 2016-12-01 DIAGNOSIS — M5126 Other intervertebral disc displacement, lumbar region: Secondary | ICD-10-CM | POA: Diagnosis not present

## 2016-12-20 ENCOUNTER — Encounter (HOSPITAL_COMMUNITY)
Admission: RE | Admit: 2016-12-20 | Discharge: 2016-12-20 | Disposition: A | Discharge status: Home / Self Care | Payer: Medicare Other | Source: Ambulatory Visit | Attending: Neurosurgery | Admitting: Neurosurgery

## 2016-12-20 ENCOUNTER — Encounter (HOSPITAL_COMMUNITY): Payer: Self-pay

## 2016-12-20 DIAGNOSIS — Z01818 Encounter for other preprocedural examination: Secondary | ICD-10-CM | POA: Diagnosis not present

## 2016-12-20 DIAGNOSIS — M5126 Other intervertebral disc displacement, lumbar region: Secondary | ICD-10-CM | POA: Diagnosis not present

## 2016-12-20 LAB — CBC WITH DIFFERENTIAL/PLATELET
Basophils Absolute: 0 10*3/uL (ref 0.0–0.1)
Basophils Relative: 0 %
EOS ABS: 0.1 10*3/uL (ref 0.0–0.7)
Eosinophils Relative: 2 %
HEMATOCRIT: 46.1 % (ref 39.0–52.0)
HEMOGLOBIN: 16.1 g/dL (ref 13.0–17.0)
Lymphocytes Relative: 22 %
Lymphs Abs: 1.3 10*3/uL (ref 0.7–4.0)
MCH: 33.4 pg (ref 26.0–34.0)
MCHC: 34.9 g/dL (ref 30.0–36.0)
MCV: 95.6 fL (ref 78.0–100.0)
MONOS PCT: 8 %
Monocytes Absolute: 0.5 10*3/uL (ref 0.1–1.0)
NEUTROS ABS: 4 10*3/uL (ref 1.7–7.7)
NEUTROS PCT: 68 %
Platelets: 216 10*3/uL (ref 150–400)
RBC: 4.82 MIL/uL (ref 4.22–5.81)
RDW: 12.6 % (ref 11.5–15.5)
WBC: 5.9 10*3/uL (ref 4.0–10.5)

## 2016-12-20 LAB — BASIC METABOLIC PANEL
ANION GAP: 10 (ref 5–15)
BUN: 13 mg/dL (ref 6–20)
CHLORIDE: 103 mmol/L (ref 101–111)
CO2: 24 mmol/L (ref 22–32)
Calcium: 9.4 mg/dL (ref 8.9–10.3)
Creatinine, Ser: 1.1 mg/dL (ref 0.61–1.24)
Glucose, Bld: 90 mg/dL (ref 65–99)
POTASSIUM: 4.5 mmol/L (ref 3.5–5.1)
SODIUM: 137 mmol/L (ref 135–145)

## 2016-12-20 LAB — TYPE AND SCREEN
ABO/RH(D): O POS
Antibody Screen: NEGATIVE

## 2016-12-20 LAB — SURGICAL PCR SCREEN
MRSA, PCR: NEGATIVE
STAPHYLOCOCCUS AUREUS: NEGATIVE

## 2016-12-20 NOTE — Progress Notes (Signed)
PCP: Dr. Melford Aase

## 2016-12-20 NOTE — Pre-Procedure Instructions (Addendum)
    Jacob Larsen  12/20/2016      [id number] - [place], [place] - [place] [address] [place] [address] Phone: [phone] Fax: [phone]  [facility] [place], [place] [address] [place] [address] Phone: [phone] Fax: [phone]    Your procedure is scheduled on Tuesday, December 27, 2016  Report to [facility] Admitting at 6:00 A.M.  Call this number if you have problems the morning of surgery:  [phone]   Remember:  Do not eat food or drink liquids after midnight Monday, December 26, 2016  Take these medicines the morning of surgery with A SIP OF WATER : finasteride (PROSCAR), omeprazole (PRILOSEC), ranitidine (ZANTAC), tamsulosin (FLOMAX), if needed:  oxyCODONE for pain, loratadine (CLARITIN) for allergies, fluticasone (FLONASE)  nasal spray for allergies  Stop taking Aspirin, vitamins, fish oil, Flaxseed oil, and herbal medications. Do not take any NSAIDs ie: Ibuprofen, Advil, Naproxen, Diclofenac (Voltaren) , BC and Goody Powder or any medication containing Aspirin; stop now.  Do not wear jewelry, make-up or nail polish.  Do not wear lotions, powders, or perfumes, or deoderant.  Do not shave 48 hours prior to surgery.  Men may shave face and neck.  Do not bring valuables to the hospital.  [facility] is not responsible for any belongings or valuables.  Contacts, dentures or bridgework may not be worn into surgery.  Leave your suitcase in the car.  After surgery it may be brought to your room.  For patients admitted to the hospital, discharge time will be determined by your treatment team.  Patients discharged the day of surgery will not be allowed to drive home.   Special instructions: Shower the night before surgery and the morning of surgery with CHG.  Please read over the following fact sheets that you were given. Pain  Booklet, Coughing and Deep Breathing, Blood Transfusion Information, MRSA Information and Surgical Site Infection Prevention

## 2016-12-26 NOTE — Anesthesia Preprocedure Evaluation (Addendum)
Anesthesia Evaluation  Patient identified by MRN, date of birth, ID band Patient awake    Reviewed: Allergy & Precautions, NPO status , Patient's Chart, lab work & pertinent test results  History of Anesthesia Complications Negative for: history of anesthetic complications  Airway Mallampati: II  TM Distance: >3 FB Neck ROM: Full    Dental  (+) Dental Advisory Given   Pulmonary former smoker,    breath sounds clear to auscultation       Cardiovascular  Rhythm:Regular Rate:Normal  '10 ECHO: EF 60%, valves ok   Neuro/Psych Anxiety Chronic back pain: narcotics, walks with walker    GI/Hepatic Neg liver ROS, GERD  Medicated and Controlled,  Endo/Other  negative endocrine ROS  Renal/GU negative Renal ROS     Musculoskeletal  (+) Arthritis ,   Abdominal   Peds  Hematology negative hematology ROS (+)   Anesthesia Other Findings   Reproductive/Obstetrics                            Anesthesia Physical Anesthesia Plan  ASA: II  Anesthesia Plan: General   Post-op Pain Management:    Induction: Intravenous  Airway Management Planned: Oral ETT  Additional Equipment:   Intra-op Plan:   Post-operative Plan: Extubation in OR  Informed Consent: I have reviewed the patients History and Physical, chart, labs and discussed the procedure including the risks, benefits and alternatives for the proposed anesthesia with the patient or authorized representative who has indicated his/her understanding and acceptance.   Dental advisory given  Plan Discussed with: CRNA and Surgeon  Anesthesia Plan Comments: (Plan routine monitors, GETA)        Anesthesia Quick Evaluation

## 2016-12-27 ENCOUNTER — Inpatient Hospital Stay (HOSPITAL_COMMUNITY): Payer: Medicare Other | Admitting: Anesthesiology

## 2016-12-27 ENCOUNTER — Inpatient Hospital Stay (HOSPITAL_COMMUNITY)
Admission: RE | Admit: 2016-12-27 | Discharge: 2016-12-28 | DRG: 455 | Disposition: A | Discharge status: Home / Self Care | Payer: Medicare Other | Source: Ambulatory Visit | Attending: Neurosurgery | Admitting: Neurosurgery

## 2016-12-27 ENCOUNTER — Inpatient Hospital Stay (HOSPITAL_COMMUNITY)
Admission: RE | Disposition: A | Discharge status: Home / Self Care | Payer: Self-pay | Source: Ambulatory Visit | Attending: Neurosurgery

## 2016-12-27 ENCOUNTER — Inpatient Hospital Stay (HOSPITAL_COMMUNITY): Payer: Medicare Other

## 2016-12-27 ENCOUNTER — Ambulatory Visit: Payer: Self-pay | Admitting: Internal Medicine

## 2016-12-27 DIAGNOSIS — E785 Hyperlipidemia, unspecified: Secondary | ICD-10-CM | POA: Diagnosis present

## 2016-12-27 DIAGNOSIS — K219 Gastro-esophageal reflux disease without esophagitis: Secondary | ICD-10-CM | POA: Diagnosis present

## 2016-12-27 DIAGNOSIS — M5127 Other intervertebral disc displacement, lumbosacral region: Principal | ICD-10-CM | POA: Diagnosis present

## 2016-12-27 DIAGNOSIS — M4316 Spondylolisthesis, lumbar region: Secondary | ICD-10-CM | POA: Diagnosis not present

## 2016-12-27 DIAGNOSIS — Z833 Family history of diabetes mellitus: Secondary | ICD-10-CM

## 2016-12-27 DIAGNOSIS — Z419 Encounter for procedure for purposes other than remedying health state, unspecified: Secondary | ICD-10-CM

## 2016-12-27 DIAGNOSIS — Z806 Family history of leukemia: Secondary | ICD-10-CM

## 2016-12-27 DIAGNOSIS — M5126 Other intervertebral disc displacement, lumbar region: Secondary | ICD-10-CM | POA: Diagnosis not present

## 2016-12-27 DIAGNOSIS — M7138 Other bursal cyst, other site: Secondary | ICD-10-CM | POA: Diagnosis present

## 2016-12-27 DIAGNOSIS — E782 Mixed hyperlipidemia: Secondary | ICD-10-CM | POA: Diagnosis not present

## 2016-12-27 DIAGNOSIS — Z87891 Personal history of nicotine dependence: Secondary | ICD-10-CM

## 2016-12-27 DIAGNOSIS — Z8249 Family history of ischemic heart disease and other diseases of the circulatory system: Secondary | ICD-10-CM

## 2016-12-27 DIAGNOSIS — Z981 Arthrodesis status: Secondary | ICD-10-CM

## 2016-12-27 DIAGNOSIS — N400 Enlarged prostate without lower urinary tract symptoms: Secondary | ICD-10-CM | POA: Diagnosis not present

## 2016-12-27 DIAGNOSIS — M4687 Other specified inflammatory spondylopathies, lumbosacral region: Secondary | ICD-10-CM | POA: Diagnosis present

## 2016-12-27 SURGERY — POSTERIOR LUMBAR FUSION 1 WITH HARDWARE REMOVAL
Anesthesia: General | Site: Back

## 2016-12-27 MED ORDER — CEFAZOLIN SODIUM-DEXTROSE 2-4 GM/100ML-% IV SOLN
2.0000 g | INTRAVENOUS | Status: AC
  Administered 2016-12-27: 2 g via INTRAVENOUS
  Filled 2016-12-27: qty 100

## 2016-12-27 MED ORDER — BUPIVACAINE HCL (PF) 0.25 % IJ SOLN
INTRAMUSCULAR | Status: AC
  Filled 2016-12-27: qty 30

## 2016-12-27 MED ORDER — ONDANSETRON HCL 4 MG PO TABS: 4.0000 mg | ORAL_TABLET | Freq: Four times a day (QID) | ORAL | Status: DC | PRN

## 2016-12-27 MED ORDER — TAMSULOSIN HCL 0.4 MG PO CAPS
0.4000 mg | ORAL_CAPSULE | Freq: Every day | ORAL | Status: DC
  Administered 2016-12-28: 0.4 mg via ORAL
  Filled 2016-12-27: qty 1

## 2016-12-27 MED ORDER — OXYCODONE HCL 5 MG PO TABS
15.0000 mg | ORAL_TABLET | ORAL | Status: DC | PRN
  Administered 2016-12-27 – 2016-12-28 (×4): 15 mg via ORAL
  Filled 2016-12-27 (×4): qty 3

## 2016-12-27 MED ORDER — HYDROMORPHONE HCL 1 MG/ML IJ SOLN
0.2500 mg | INTRAMUSCULAR | Status: DC | PRN
  Administered 2016-12-27 (×2): 0.25 mg via INTRAVENOUS

## 2016-12-27 MED ORDER — GLYCOPYRROLATE 0.2 MG/ML IV SOSY
PREFILLED_SYRINGE | INTRAVENOUS | Status: DC | PRN
  Administered 2016-12-27: .2 mg via INTRAVENOUS

## 2016-12-27 MED ORDER — HYDROMORPHONE HCL 1 MG/ML IJ SOLN
0.5000 mg | INTRAMUSCULAR | Status: DC | PRN
  Administered 2016-12-27: 1 mg via INTRAVENOUS
  Filled 2016-12-27: qty 1

## 2016-12-27 MED ORDER — BUPIVACAINE HCL (PF) 0.25 % IJ SOLN
INTRAMUSCULAR | Status: DC | PRN
  Administered 2016-12-27: 30 mL

## 2016-12-27 MED ORDER — FENTANYL CITRATE (PF) 250 MCG/5ML IJ SOLN
INTRAMUSCULAR | Status: AC
  Filled 2016-12-27: qty 5

## 2016-12-27 MED ORDER — HYDROMORPHONE HCL 1 MG/ML IJ SOLN
INTRAMUSCULAR | Status: AC
  Administered 2016-12-27: 0.25 mg via INTRAVENOUS
  Filled 2016-12-27: qty 0.5

## 2016-12-27 MED ORDER — CHLORHEXIDINE GLUCONATE CLOTH 2 % EX PADS: 6.0000 | MEDICATED_PAD | Freq: Once | CUTANEOUS | Status: DC

## 2016-12-27 MED ORDER — DEXAMETHASONE SODIUM PHOSPHATE 10 MG/ML IJ SOLN
10.0000 mg | INTRAMUSCULAR | Status: DC
  Filled 2016-12-27: qty 1

## 2016-12-27 MED ORDER — LIDOCAINE 2% (20 MG/ML) 5 ML SYRINGE
INTRAMUSCULAR | Status: DC | PRN
  Administered 2016-12-27: 30 mg via INTRAVENOUS

## 2016-12-27 MED ORDER — VITAMIN D 1000 UNITS PO TABS
4000.0000 [IU] | ORAL_TABLET | Freq: Every day | ORAL | Status: DC
  Administered 2016-12-27 – 2016-12-28 (×2): 4000 [IU] via ORAL
  Filled 2016-12-27 (×2): qty 4

## 2016-12-27 MED ORDER — THROMBIN 20000 UNITS EX SOLR
CUTANEOUS | Status: AC
  Filled 2016-12-27: qty 20000

## 2016-12-27 MED ORDER — PROPOFOL 10 MG/ML IV BOLUS
INTRAVENOUS | Status: DC | PRN
  Administered 2016-12-27: 200 mg via INTRAVENOUS

## 2016-12-27 MED ORDER — PROPOFOL 10 MG/ML IV BOLUS
INTRAVENOUS | Status: AC
  Filled 2016-12-27: qty 20

## 2016-12-27 MED ORDER — PHENYLEPHRINE 40 MCG/ML (10ML) SYRINGE FOR IV PUSH (FOR BLOOD PRESSURE SUPPORT)
PREFILLED_SYRINGE | INTRAVENOUS | Status: DC | PRN
  Administered 2016-12-27 (×2): 200 ug via INTRAVENOUS
  Administered 2016-12-27: 80 ug via INTRAVENOUS
  Administered 2016-12-27: 200 ug via INTRAVENOUS

## 2016-12-27 MED ORDER — FLAX SEED OIL 1000 MG PO CAPS: 1000.0000 mg | ORAL_CAPSULE | Freq: Two times a day (BID) | ORAL | Status: DC

## 2016-12-27 MED ORDER — VANCOMYCIN HCL 1000 MG IV SOLR
INTRAVENOUS | Status: AC
  Filled 2016-12-27: qty 1000

## 2016-12-27 MED ORDER — ROCURONIUM BROMIDE 10 MG/ML (PF) SYRINGE
PREFILLED_SYRINGE | INTRAVENOUS | Status: DC | PRN
  Administered 2016-12-27: 50 mg via INTRAVENOUS
  Administered 2016-12-27 (×2): 10 mg via INTRAVENOUS
  Administered 2016-12-27: 20 mg via INTRAVENOUS
  Administered 2016-12-27 (×5): 10 mg via INTRAVENOUS

## 2016-12-27 MED ORDER — SODIUM CHLORIDE 0.9% FLUSH: 3.0000 mL | INTRAVENOUS | Status: DC | PRN

## 2016-12-27 MED ORDER — VANCOMYCIN HCL 1000 MG IV SOLR
INTRAVENOUS | Status: DC | PRN
  Administered 2016-12-27: 1000 mg via TOPICAL

## 2016-12-27 MED ORDER — VITAMIN B-12 1000 MCG PO TABS: 1000.0000 ug | ORAL_TABLET | Freq: Every day | ORAL | Status: DC

## 2016-12-27 MED ORDER — MIDAZOLAM HCL 2 MG/2ML IJ SOLN
INTRAMUSCULAR | Status: DC | PRN
  Administered 2016-12-27: 2 mg via INTRAVENOUS

## 2016-12-27 MED ORDER — MENTHOL 3 MG MT LOZG: 1.0000 | LOZENGE | OROMUCOSAL | Status: DC | PRN

## 2016-12-27 MED ORDER — ACETAMINOPHEN 650 MG RE SUPP: 650.0000 mg | RECTAL | Status: DC | PRN

## 2016-12-27 MED ORDER — DEXTROSE 5 % IV SOLN
INTRAVENOUS | Status: DC | PRN
  Administered 2016-12-27: 25 ug/min via INTRAVENOUS

## 2016-12-27 MED ORDER — EPHEDRINE SULFATE-NACL 50-0.9 MG/10ML-% IV SOSY
PREFILLED_SYRINGE | INTRAVENOUS | Status: DC | PRN
  Administered 2016-12-27: 10 mg via INTRAVENOUS
  Administered 2016-12-27 (×2): 5 mg via INTRAVENOUS

## 2016-12-27 MED ORDER — MEPERIDINE HCL 25 MG/ML IJ SOLN: 6.2500 mg | INTRAMUSCULAR | Status: DC | PRN

## 2016-12-27 MED ORDER — DIAZEPAM 5 MG PO TABS
5.0000 mg | ORAL_TABLET | Freq: Four times a day (QID) | ORAL | Status: DC | PRN
  Administered 2016-12-27 – 2016-12-28 (×2): 5 mg via ORAL
  Filled 2016-12-27 (×2): qty 1

## 2016-12-27 MED ORDER — PANTOPRAZOLE SODIUM 40 MG PO TBEC
40.0000 mg | DELAYED_RELEASE_TABLET | Freq: Every day | ORAL | Status: DC
  Administered 2016-12-28: 40 mg via ORAL
  Filled 2016-12-27: qty 1

## 2016-12-27 MED ORDER — CEFAZOLIN SODIUM-DEXTROSE 2-4 GM/100ML-% IV SOLN
2.0000 g | Freq: Three times a day (TID) | INTRAVENOUS | Status: AC
  Administered 2016-12-27 (×2): 2 g via INTRAVENOUS
  Filled 2016-12-27 (×2): qty 100

## 2016-12-27 MED ORDER — FINASTERIDE 5 MG PO TABS
5.0000 mg | ORAL_TABLET | Freq: Every day | ORAL | Status: DC
  Administered 2016-12-28: 5 mg via ORAL
  Filled 2016-12-27: qty 1

## 2016-12-27 MED ORDER — ACETAMINOPHEN 325 MG PO TABS: 650.0000 mg | ORAL_TABLET | ORAL | Status: DC | PRN

## 2016-12-27 MED ORDER — FENTANYL CITRATE (PF) 250 MCG/5ML IJ SOLN
INTRAMUSCULAR | Status: DC | PRN
  Administered 2016-12-27: 100 ug via INTRAVENOUS
  Administered 2016-12-27: 50 ug via INTRAVENOUS
  Administered 2016-12-27: 150 ug via INTRAVENOUS
  Administered 2016-12-27 (×2): 50 ug via INTRAVENOUS
  Administered 2016-12-27: 100 ug via INTRAVENOUS

## 2016-12-27 MED ORDER — DEXAMETHASONE SODIUM PHOSPHATE 10 MG/ML IJ SOLN
INTRAMUSCULAR | Status: DC | PRN
  Administered 2016-12-27: 10 mg via INTRAVENOUS

## 2016-12-27 MED ORDER — 0.9 % SODIUM CHLORIDE (POUR BTL) OPTIME
TOPICAL | Status: DC | PRN
  Administered 2016-12-27: 1000 mL

## 2016-12-27 MED ORDER — ONDANSETRON HCL 4 MG/2ML IJ SOLN
INTRAMUSCULAR | Status: DC | PRN
  Administered 2016-12-27: 4 mg via INTRAVENOUS

## 2016-12-27 MED ORDER — FLUTICASONE PROPIONATE 50 MCG/ACT NA SUSP: 2.0000 | Freq: Every day | NASAL | Status: DC | PRN

## 2016-12-27 MED ORDER — LORATADINE 10 MG PO TABS: 10.0000 mg | ORAL_TABLET | Freq: Every day | ORAL | Status: DC | PRN

## 2016-12-27 MED ORDER — THROMBIN 20000 UNITS EX SOLR
CUTANEOUS | Status: DC | PRN
  Administered 2016-12-27: 09:00:00 via TOPICAL

## 2016-12-27 MED ORDER — SODIUM CHLORIDE 0.9 % IR SOLN
Status: DC | PRN
  Administered 2016-12-27: 09:00:00

## 2016-12-27 MED ORDER — SODIUM CHLORIDE 0.9% FLUSH
3.0000 mL | Freq: Two times a day (BID) | INTRAVENOUS | Status: DC
  Administered 2016-12-27: 3 mL via INTRAVENOUS

## 2016-12-27 MED ORDER — LACTATED RINGERS IV SOLN
INTRAVENOUS | Status: DC | PRN
  Administered 2016-12-27 (×3): via INTRAVENOUS

## 2016-12-27 MED ORDER — DOCUSATE SODIUM 100 MG PO CAPS
100.0000 mg | ORAL_CAPSULE | Freq: Two times a day (BID) | ORAL | Status: DC
  Administered 2016-12-27 – 2016-12-28 (×2): 100 mg via ORAL
  Filled 2016-12-27 (×2): qty 1

## 2016-12-27 MED ORDER — MIDAZOLAM HCL 2 MG/2ML IJ SOLN: 0.5000 mg | Freq: Once | INTRAMUSCULAR | Status: DC | PRN

## 2016-12-27 MED ORDER — PHENOL 1.4 % MT LIQD: 1.0000 | OROMUCOSAL | Status: DC | PRN

## 2016-12-27 MED ORDER — PROMETHAZINE HCL 25 MG/ML IJ SOLN: 6.2500 mg | INTRAMUSCULAR | Status: DC | PRN

## 2016-12-27 MED ORDER — MIDAZOLAM HCL 2 MG/2ML IJ SOLN
INTRAMUSCULAR | Status: AC
  Filled 2016-12-27: qty 2

## 2016-12-27 MED ORDER — FAMOTIDINE 20 MG PO TABS
20.0000 mg | ORAL_TABLET | Freq: Two times a day (BID) | ORAL | Status: DC
  Administered 2016-12-27 – 2016-12-28 (×2): 20 mg via ORAL
  Filled 2016-12-27 (×2): qty 1

## 2016-12-27 MED ORDER — SUGAMMADEX SODIUM 200 MG/2ML IV SOLN
INTRAVENOUS | Status: DC | PRN
  Administered 2016-12-27: 200 mg via INTRAVENOUS

## 2016-12-27 MED ORDER — ATORVASTATIN CALCIUM 20 MG PO TABS: 40.0000 mg | ORAL_TABLET | ORAL | Status: DC

## 2016-12-27 MED ORDER — SENNOSIDES-DOCUSATE SODIUM 8.6-50 MG PO TABS: 1.0000 | ORAL_TABLET | Freq: Every evening | ORAL | Status: DC | PRN

## 2016-12-27 MED ORDER — ONDANSETRON HCL 4 MG/2ML IJ SOLN
4.0000 mg | Freq: Four times a day (QID) | INTRAMUSCULAR | Status: DC | PRN
  Administered 2016-12-27: 4 mg via INTRAVENOUS
  Filled 2016-12-27: qty 2

## 2016-12-27 MED FILL — Sodium Chloride IV Soln 0.9%: INTRAVENOUS | Qty: 1000 | Status: AC

## 2016-12-27 MED FILL — Heparin Sodium (Porcine) Inj 1000 Unit/ML: INTRAMUSCULAR | Qty: 30 | Status: AC

## 2016-12-27 SURGICAL SUPPLY — 62 items
ADH SKN CLS APL DERMABOND .7 (GAUZE/BANDAGES/DRESSINGS) ×1
APL SKNCLS STERI-STRIP NONHPOA (GAUZE/BANDAGES/DRESSINGS) ×1
BAG DECANTER FOR FLEXI CONT (MISCELLANEOUS) ×2 IMPLANT
BENZOIN TINCTURE PRP APPL 2/3 (GAUZE/BANDAGES/DRESSINGS) ×2 IMPLANT
BLADE CLIPPER SURG (BLADE) IMPLANT
BUR CUTTER 7.0 ROUND (BURR) ×2 IMPLANT
BUR MATCHSTICK NEURO 3.0 LAGG (BURR) ×2 IMPLANT
CANISTER SUCT 3000ML PPV (MISCELLANEOUS) ×2 IMPLANT
CAP LOCKING THREADED (Cap) ×8 IMPLANT
CARTRIDGE OIL MAESTRO DRILL (MISCELLANEOUS) ×1 IMPLANT
CONT SPEC 4OZ CLIKSEAL STRL BL (MISCELLANEOUS) ×2 IMPLANT
COVER BACK TABLE 24X17X13 BIG (DRAPES) IMPLANT
COVER BACK TABLE 60X90IN (DRAPES) ×2 IMPLANT
DECANTER SPIKE VIAL GLASS SM (MISCELLANEOUS) ×2 IMPLANT
DERMABOND ADVANCED (GAUZE/BANDAGES/DRESSINGS) ×1
DERMABOND ADVANCED .7 DNX12 (GAUZE/BANDAGES/DRESSINGS) ×1 IMPLANT
DEVICE INTERBODY ELEVATE 9X23 (Cage) ×2 IMPLANT
DIFFUSER DRILL AIR PNEUMATIC (MISCELLANEOUS) ×2 IMPLANT
DRAPE C-ARM 42X72 X-RAY (DRAPES) ×4 IMPLANT
DRAPE HALF SHEET 40X57 (DRAPES) IMPLANT
DRAPE LAPAROTOMY 100X72X124 (DRAPES) ×2 IMPLANT
DRAPE POUCH INSTRU U-SHP 10X18 (DRAPES) ×2 IMPLANT
DRAPE SURG 17X23 STRL (DRAPES) ×8 IMPLANT
DRSG OPSITE POSTOP 4X8 (GAUZE/BANDAGES/DRESSINGS) ×1 IMPLANT
DURAPREP 26ML APPLICATOR (WOUND CARE) ×2 IMPLANT
ELECT REM PT RETURN 9FT ADLT (ELECTROSURGICAL) ×2
ELECTRODE REM PT RTRN 9FT ADLT (ELECTROSURGICAL) ×1 IMPLANT
EVACUATOR 1/8 PVC DRAIN (DRAIN) ×1 IMPLANT
GAUZE SPONGE 4X4 12PLY STRL (GAUZE/BANDAGES/DRESSINGS) ×2 IMPLANT
GAUZE SPONGE 4X4 16PLY XRAY LF (GAUZE/BANDAGES/DRESSINGS) IMPLANT
GLOVE BIO SURGEON STRL SZ8 (GLOVE) ×1 IMPLANT
GLOVE BIOGEL PI IND STRL 8.5 (GLOVE) IMPLANT
GLOVE BIOGEL PI INDICATOR 8.5 (GLOVE) ×1
GLOVE ECLIPSE 9.0 STRL (GLOVE) ×4 IMPLANT
GLOVE EXAM NITRILE LRG STRL (GLOVE) IMPLANT
GLOVE EXAM NITRILE XL STR (GLOVE) IMPLANT
GLOVE EXAM NITRILE XS STR PU (GLOVE) IMPLANT
GOWN STRL REUS W/ TWL LRG LVL3 (GOWN DISPOSABLE) IMPLANT
GOWN STRL REUS W/ TWL XL LVL3 (GOWN DISPOSABLE) ×2 IMPLANT
GOWN STRL REUS W/TWL 2XL LVL3 (GOWN DISPOSABLE) IMPLANT
GOWN STRL REUS W/TWL LRG LVL3 (GOWN DISPOSABLE)
GOWN STRL REUS W/TWL XL LVL3 (GOWN DISPOSABLE) ×4
KIT BASIN OR (CUSTOM PROCEDURE TRAY) ×2 IMPLANT
KIT ROOM TURNOVER OR (KITS) ×2 IMPLANT
NEEDLE HYPO 22GX1.5 SAFETY (NEEDLE) ×2 IMPLANT
NS IRRIG 1000ML POUR BTL (IV SOLUTION) ×2 IMPLANT
OIL CARTRIDGE MAESTRO DRILL (MISCELLANEOUS) ×2
PACK LAMINECTOMY NEURO (CUSTOM PROCEDURE TRAY) ×2 IMPLANT
ROD CREO CVD 5.5X125 (Rod) ×1 IMPLANT
ROD CREO CVD 5.5X95 (Rod) ×1 IMPLANT
SCREW POLY THRD CREO 6.5X40 (Screw) ×2 IMPLANT
SPONGE LAP 4X18 X RAY DECT (DISPOSABLE) ×1 IMPLANT
SPONGE SURGIFOAM ABS GEL 100 (HEMOSTASIS) ×2 IMPLANT
STRIP CLOSURE SKIN 1/2X4 (GAUZE/BANDAGES/DRESSINGS) ×3 IMPLANT
SUT VIC AB 0 CT1 18XCR BRD8 (SUTURE) ×2 IMPLANT
SUT VIC AB 0 CT1 8-18 (SUTURE) ×4
SUT VIC AB 2-0 CT1 18 (SUTURE) ×2 IMPLANT
SUT VIC AB 3-0 SH 8-18 (SUTURE) ×3 IMPLANT
TOWEL GREEN STERILE (TOWEL DISPOSABLE) ×2 IMPLANT
TOWEL GREEN STERILE FF (TOWEL DISPOSABLE) ×2 IMPLANT
TRAY FOLEY W/METER SILVER 16FR (SET/KITS/TRAYS/PACK) ×2 IMPLANT
WATER STERILE IRR 1000ML POUR (IV SOLUTION) ×2 IMPLANT

## 2016-12-27 NOTE — Anesthesia Postprocedure Evaluation (Addendum)
Anesthesia Post Note  Patient: [name]  Procedure(s) Performed: Procedure(s) (LRB): Posterior Lumbar Interbody Fusion - Lumbar Five-Sacral one, Resection of synovial cyst, Removal of hardware (N/A)  Patient location during evaluation: PACU Anesthesia Type: General Level of consciousness: awake and alert, oriented and patient cooperative Pain management: pain level controlled Vital Signs Assessment: post-procedure vital signs reviewed and stable Respiratory status: spontaneous breathing, nonlabored ventilation, respiratory function stable and patient connected to nasal cannula oxygen Cardiovascular status: blood pressure returned to baseline and stable Postop Assessment: no signs of nausea or vomiting Anesthetic complications: no       Last Vitals:  Vitals:   12/27/16 1325 12/27/16 1330  BP: 104/68   Pulse: 65 67  Resp: 15 10  Temp:      Last Pain:  Vitals:   12/27/16 1325  TempSrc:   PainSc: 2                  Tysin Salada,E. Malone Admire

## 2016-12-27 NOTE — Anesthesia Procedure Notes (Signed)
Procedure Name: Intubation Date/Time: 12/27/2016 8:02 AM Performed by: Teressa Lower Pre-anesthesia Checklist: Patient identified, Emergency Drugs available, Suction available and Patient being monitored Patient Re-evaluated:Patient Re-evaluated prior to inductionOxygen Delivery Method: Circle system utilized Preoxygenation: Pre-oxygenation with 100% oxygen Intubation Type: IV induction Ventilation: Mask ventilation without difficulty Laryngoscope Size: Mac and 4 Grade View: Grade I Tube type: Oral Tube size: 7.5 mm Number of attempts: 1 Airway Equipment and Method: Stylet and Oral airway Placement Confirmation: ETT inserted through vocal cords under direct vision,  positive ETCO2 and breath sounds checked- equal and bilateral Tube secured with: Tape Dental Injury: Teeth and Oropharynx as per pre-operative assessment

## 2016-12-27 NOTE — H&P (Signed)
Jacob Larsen is an 66 y.o. male.   Chief Complaint: Back pain HPI: 66 year old male status post previous L4-5 and subsequent L4-5 decompression and fusion by another physician. Patient presents now with worsening back and bilateral lower extremity pain. Workup demonstrates evidence of a large central disc herniation with cephalad migration at L5-S1. Patient has failed conservative management presents now for L5-S1 decompression and fusion.  Past Medical History:  Diagnosis Date  . Anxiety    takes Valium daily as needed  . Arthritis    in neck  . BPH (benign prostatic hyperplasia)    takes Flomax daily  . Chronic back pain    DDD  . Diverticulosis   . Foot drop    both feet  . GERD (gastroesophageal reflux disease)    takes Omeprazole daily as well as Zantac  . Hyperlipidemia    takes Atorvastatin daily  . Hypogonadism male   . Nocturia   . Numbness    only in 4th and 5th finger on left hand and left leg  . Seasonal allergies    takes Claritin daily as needed and uses Flonase daily as needed  . Urinary frequency    takes Flomax daily    Past Surgical History:  Procedure Laterality Date  . BACK SURGERY     lumbar fusion x2  . CERVICAL FUSION  2011/2014  . COLONOSCOPY    . KNEE ARTHROSCOPY     right 1985; left 1992  . SEPTOPLASTY    . SHOULDER ARTHROSCOPY Left 2015  . SHOULDER SURGERY Right 08    Family History  Problem Relation Age of Onset  . Leukemia Mother   . Diabetes Mother   . Hypertension Father   . Diabetes Father   . Colon cancer Neg Hx   . Stomach cancer Neg Hx    Social History:  reports that he quit smoking about 21 years ago. He has never used smokeless tobacco. He reports that he drinks alcohol. He reports that he does not use drugs.  Allergies:  Allergies  Allergen Reactions  . Other Hives    Beef-hives  RED MEAT  . Aspirin Hives and Rash  . Salicylates Rash    Medications Prior to Admission  Medication Sig Dispense Refill  .  atorvastatin (LIPITOR) 80 MG tablet Take 40 mg by mouth 3 (three) times a week. Tuesday, Thursday, Saturday    . Cholecalciferol (VITAMIN D) 2000 UNITS tablet Take 4,000 Units by mouth daily.     . diclofenac (VOLTAREN) 75 MG EC tablet TAKE 1 TABLET BY MOUTH TWICE A DAY 180 tablet 1  . finasteride (PROSCAR) 5 MG tablet TAKE 1 TABLET DAILY FOR PROSTATE 90 tablet 1  . Flaxseed, Linseed, (FLAX SEED OIL) 1000 MG CAPS Take 1 capsule (1,000 mg total) by mouth 2 (two) times daily.  0  . Magnesium 250 MG TABS Take 250 mg by mouth daily.    Marland Kitchen omeprazole (PRILOSEC) 20 MG capsule Take 1 capsule (20 mg total) by mouth daily. 90 capsule 2  . oxyCODONE (ROXICODONE) 15 MG immediate release tablet Take 15 mg by mouth every 6 (six) hours as needed.  0  . ranitidine (ZANTAC) 300 MG tablet TAKE 1 TABLET BY MOUTH TWICE A DAY 180 tablet 1  . tamsulosin (FLOMAX) 0.4 MG CAPS capsule Take 0.4 mg by mouth daily.    Marland Kitchen testosterone cypionate (DEPOTESTOSTERONE CYPIONATE) 200 MG/ML injection INJECT 2 MLS INTRAMUSCULARLY EVERY 2 WEEKS *DISCARD VIAL 30 AFTER PUNCTURE* 10 mL 3  .  vitamin B-12 (CYANOCOBALAMIN) 1000 MCG tablet Take 1,000 mcg by mouth daily.    . fluticasone (FLONASE) 50 MCG/ACT nasal spray PLACE 2 SPRAYS INTO BOTH NOSTRILS DAILY. (Patient taking differently: Place 2 sprays into both nostrils daily as needed for allergies. ) 16 g 3  . loratadine (CLARITIN) 10 MG tablet Take 1 tablet (10 mg total) by mouth as needed for allergies. 90 tablet 4  . sildenafil (VIAGRA) 100 MG tablet Take 1 tablet (100 mg total) by mouth as needed for erectile dysfunction. 30 tablet 1    No results found for this or any previous visit (from the past 48 hour(s)). No results found.  Pertinent items noted in HPI and remainder of comprehensive ROS otherwise negative.  Blood pressure 134/87, pulse 68, temperature 98.2 F (36.8 C), temperature source Oral, resp. rate 20, weight 91.6 kg (202 lb), SpO2 98 %.  Patient is awake and alert. He  is oriented and appropriate. Speech is fluent. His judgment and insight are intact. Examination his cranial nerve function is normal bilaterally. Motor examination is mild weakness of plantar flexion bilaterally otherwise motor strength intact. Sensory examination nonfocal. Straight leg raising positive bilaterally. Deep tendon versus hypoactive but symmetric. No evidence of long track signs. Examination HEENT unremarkable. Chest and abdomen are benign. Extremities free from injury deformity. Assessment/Plan Large central L5-S1 disc herniation, Katie by prior L3-L5 fusion. Plan L5-S1 bilateral decompressive laminotomies and foraminotomies of posterior lumbar interbody fusion utilizing interbody expandable cage, locally harvested autograft, and augmented with posterior lateral arthrodesis utilizing segmental pedicle screw fixation from L3-S1. Risks and benefits been explained. Patient wishes to proceed.  Dshawn Mcnay,Jonael A 12/27/2016, 7:45 AM

## 2016-12-27 NOTE — Transfer of Care (Signed)
Immediate Anesthesia Transfer of Care Note  Patient: Jacob Larsen  Procedure(s) Performed: Procedure(s): Posterior Lumbar Interbody Fusion - Lumbar Five-Sacral one, Resection of synovial cyst, Removal of hardware (N/A)  Patient Location: PACU  Anesthesia Type:General  Level of Consciousness: drowsy  Airway & Oxygen Therapy: Patient connected to face mask oxygen  Post-op Assessment: Report given to RN and Post -op Vital signs reviewed and stable  Post vital signs: Reviewed and stable  Last Vitals:  Vitals:   12/27/16 0639  BP: 134/87  Pulse: 68  Resp: 20  Temp: 36.8 C    Last Pain:  Vitals:   12/27/16 0714  TempSrc:   PainSc: 8          Complications: No apparent anesthesia complications

## 2016-12-27 NOTE — Evaluation (Signed)
Physical Therapy Evaluation Patient Details Name: Jacob Larsen MRN: [id number] DOB: 1951-07-11 Today's Date: 12/27/2016   History of Present Illness  Pt is a 66 yo male who underwent Bilateral L5 decompressive laminotomies with bilateral resection of adherent synovial cyst, microdissection on 4/17. PMH: anxiety, BPH, DD, bilat foot drop, gerd. PSH: back surgery x 2, SHLD arthrooscopy, cervical fusion.  Clinical Impression  Patient is s/p above surgery resulting in the deficits listed below (see PT Problem List). Pt tolerated OOB well for first time up. Pt requires RW at this time but anticipate will progress well. Patient will benefit from skilled PT to increase their independence and safety with mobility (while adhering to their precautions) to allow discharge to the venue listed below.     Follow Up Recommendations No PT follow up;Supervision/Assistance - 24 hour (out pt PT when appropriate by MD)    Equipment Recommendations  None recommended by PT    Recommendations for Other Services       Precautions / Restrictions Precautions Precautions: Back Precaution Booklet Issued: Yes (comment) Precaution Comments: pt and spouse with verbal understanding Restrictions Weight Bearing Restrictions: No      Mobility  Bed Mobility Overal bed mobility: Needs Assistance Bed Mobility: Rolling;Sidelying to Sit Rolling: Min guard;Supervision Sidelying to sit: Min guard       General bed mobility comments: v/c's for technique, used bed rail but aware he wont have that at home  Transfers Overall transfer level: Needs assistance Equipment used: Rolling walker (2 wheeled) Transfers: Sit to/from Stand Sit to Stand: Min guard         General transfer comment: increased time, v/c's to push up, bed elevated to minimze bending due to pt increased height  Ambulation/Gait Ambulation/Gait assistance: Min guard Ambulation Distance (Feet): 200 Feet Assistive device: Rolling walker (2  wheeled) Gait Pattern/deviations: Step-through pattern;Decreased stride length;Narrow base of support Gait velocity: slow Gait velocity interpretation: Below normal speed for age/gender General Gait Details: attempted to amb without AD however pt very unsteady with narrow base of support and reaching for objects to hold with decreased step length. Pt with improved fluidity of gait and abiltiy to maintain upright posture, v/c's to contract abdominal muscles to support back  Stairs            Wheelchair Mobility    Modified Rankin (Stroke Patients Only)       Balance Overall balance assessment:  (needs RW for safe amb)                                           Pertinent Vitals/Pain Pain Assessment: 0-10 Pain Score: 8  Pain Location: back Pain Descriptors / Indicators: Sore Pain Intervention(s): RN gave pain meds during session    Home Living Family/patient expects to be discharged to:: Private residence Living Arrangements: Spouse/significant other Available Help at Discharge: Family;Available 24 hours/day Type of Home: House Home Access: Stairs to enter Entrance Stairs-Rails: None Entrance Stairs-Number of Steps: 1 Home Layout: One level Home Equipment: Walker - 2 wheels;Shower seat      Prior Function Level of Independence: Independent with assistive device(s)         Comments: started using a walker due to pain and L LE weakness     Hand Dominance   Dominant Hand: Right    Extremity/Trunk Assessment   Upper Extremity Assessment Upper Extremity Assessment: Overall WFL for tasks  assessed    Lower Extremity Assessment Lower Extremity Assessment: LLE deficits/detail LLE Deficits / Details: grossly 4-/5    Cervical / Trunk Assessment Cervical / Trunk Assessment: Other exceptions Cervical / Trunk Exceptions: back surgery  Communication   Communication: No difficulties  Cognition Arousal/Alertness: Awake/alert Behavior During  Therapy: WFL for tasks assessed/performed Overall Cognitive Status: Within Functional Limits for tasks assessed                                        General Comments General comments (skin integrity, edema, etc.): discussed donning of back brace and wearing t-shirt under brace    Exercises     Assessment/Plan    PT Assessment Patient needs continued PT services  PT Problem List Decreased mobility;Decreased strength;Decreased activity tolerance;Decreased balance;Pain       PT Treatment Interventions DME instruction;Gait training;Stair training;Functional mobility training;Therapeutic activities;Therapeutic exercise;Balance training;Neuromuscular re-education    PT Goals (Current goals can be found in the Care Plan section)  Acute Rehab PT Goals Patient Stated Goal: home PT Goal Formulation: With patient/family Time For Goal Achievement: 01/03/17 Potential to Achieve Goals: Good    Frequency Min 5X/week   Barriers to discharge        Co-evaluation               End of Session Equipment Utilized During Treatment: Back brace;Gait belt Activity Tolerance: Patient tolerated treatment well Patient left: in chair;with call bell/phone within reach;with family/visitor present Nurse Communication: Mobility status PT Visit Diagnosis: Difficulty in walking, not elsewhere classified (R26.2);Pain Pain - part of body:  (back)    Time: 3845-3646 PT Time Calculation (min) (ACUTE ONLY): 29 min   Charges:   PT Evaluation $PT Eval Moderate Complexity: 1 Procedure PT Treatments $Gait Training: 8-22 mins   PT G Codes:        Kittie Plater, PT, DPT Pager #: [phone] Office #: [phone]   [place] 12/27/2016, 4:31 PM

## 2016-12-27 NOTE — Op Note (Signed)
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  L5-S1 posterior lumbar interbody fusion utilizing interbody cages and locally harvested autograft  Reexploration of prior L3-L5 fusion with removal of hardware  L5-S1 posterior lateral arthrodesis utilizing local autograft and segmental pedicle screw instrumentation from L3-S1  Surgeon:Castin A.Amily Depp, M.D.  Asst. Surgeon: Vertell Limber  Anesthesia: General  Indication: 66 year old male with severe back and bilateral lower extremity pain. Patient status post prior L3-4 and L4-5 decompression and fusion by another physician. Workup demonstrates evidence of a large central disc herniation L5-S1 but also with significant facet arthropathy and bilateral synovial cyst at L5-S1 causing severe compression and stenosis. Patient has failed conservative management and presents now for decompression infusion at L5 S1. This will require reexploration of his prior lumbar fusion  Operative note: After induction anesthesia, patient position prone onto Wilson frame and a properly padded. Lumbar region prepped and draped sterilely. Incision made overlying L3-S1. Dissection performed bilaterally. Previously placed pedicle screw instrumentation from L3-L5 was dissected free. He was disassembled. Fusions inspected at L3-4 and L4-5. Both levels appeared solid. Pedicle screws left in place. At L5-S1 there was severe facet arthropathy bilaterally. There were large dorsal synovial cyst. These were resected using Leksell rongeurs. Decompressive laminotomies were then performed using high-speed  drill and Kerrison rongeurs to remove the inferior two thirds of the lamina of L5 the entire inferior facet and pars interarticularis of L5 bilaterally. The majority the superior facet of S1 bilaterally and the rim of the S1 lamina. Open flavum was elevated and resected. There was densely adherent synovial cysts present bilaterally. These results dissected free utilizing the microscope for microdissection of the cy   and underlying thecal sac. After dissecting the cyst free they were resected using Kerrison rongeurs. All elements the cysts were removed. Full decompressive foraminotomies were performed on course exiting L5 and S1 nerve roots bilaterally. Bilateral discectomies were then performed at L5-S1 including all elements of the large superior disc herniation L5-S1. Disc spaces and prepared for interbody fusion. Disc space was distracted with a 10 mm distractor. The distractor was left the patient's right side. Disc space was further prepared on the left side. A 9 mm extra lordotic Medtronic expandable cage was then packed with locally harvested autograft and packed into place. This is expanded to its full extent. Distractor removed patient's right side. Disc space prepared on the right side. Morselized autograft packed in the interspace. A second cage was impacted in place and expanded to its full extent. Pedicles of S1 were then verified using surface landmarks and intraoperative fluoroscopy. Superficial bone overlying the pedicle was removed using high-speed drill. Each pedicles and probed with a pedicle awl each pedicle awl track was probed and found to be solid and then bone. Each hole was then tapped with a screw tap. An 6.5 mm globus brand screws were placed bilaterally at S1. Final images revealed good position the cages and the pedicle screws the proper upper level with normal lamina spine. Wound is irrigated one final time. Transverse processes and residual facets were decorticated. Morselize  autograft was packed posterior laterally at L5-S1. Titanium rod was contoured and placed or the screw heads from L3-S1. Locking caps placed over the screws. Locking caps  were then engaged. Gelfoam was placed over the laminotomy defects. Wound is then closed in layers of Vicryl sutures. Vancomycin powder was left in the deep wound space. Steri-Strips and sterile dressing were applied. No apparent complications. Patient tolerated the procedure well and turns to the recovery room postop.

## 2016-12-27 NOTE — Brief Op Note (Signed)
12/27/2016  11:16 AM  PATIENT:  Jacob Larsen  66 y.o. male  PRE-OPERATIVE DIAGNOSIS:  herniated nucleus pulposus  POST-OPERATIVE DIAGNOSIS:  herniated nucleus pulposus and synovial cyst  PROCEDURE:  Procedure(s): Posterior Lumbar Interbody Fusion - Lumbar Five-Sacral one, Resection of synovial cyst, Removal of hardware (N/A)  SURGEON:  Surgeon(s) and Role:    * Earnie Larsson, MD - Primary    * Erline Levine, MD - Assisting  PHYSICIAN ASSISTANT:   ASSISTANTS:    ANESTHESIA:   general  EBL:  Total I/O In: 2000 [I.V.:2000] Out: 600 [Urine:300; Blood:300]  BLOOD ADMINISTERED:none  DRAINS: none   LOCAL MEDICATIONS USED:  MARCAINE     SPECIMEN:  No Specimen  DISPOSITION OF SPECIMEN:  N/A  COUNTS:  YES  TOURNIQUET:  * No tourniquets in log *  DICTATION: .Dragon Dictation  PLAN OF CARE: Admit to inpatient   PATIENT DISPOSITION:  PACU - hemodynamically stable.   Delay start of Pharmacological VTE agent (>24hrs) due to surgical blood loss or risk of bleeding: yes

## 2016-12-28 MED ORDER — OXYCODONE HCL 15 MG PO TABS: 15.0000 mg | ORAL_TABLET | Freq: Four times a day (QID) | ORAL | 0 refills | Status: DC | PRN

## 2016-12-28 MED ORDER — DIAZEPAM 5 MG PO TABS: 5.0000 mg | ORAL_TABLET | Freq: Four times a day (QID) | ORAL | 0 refills | Status: DC | PRN

## 2016-12-28 MED FILL — Sodium Chloride IV Soln 0.9%: INTRAVENOUS | Qty: 1000 | Status: AC

## 2016-12-28 MED FILL — Heparin Sodium (Porcine) Inj 1000 Unit/ML: INTRAMUSCULAR | Qty: 30 | Status: AC

## 2016-12-28 NOTE — Progress Notes (Signed)
Patient is discharged from 3C05 at this time. Alert and in stable condition. IV site d/c'd and instructions read to patient and wife with understanding verbalized. Left unit via wheelchair with all belongings at side.

## 2016-12-28 NOTE — Progress Notes (Signed)
Physical Therapy Treatment Patient Details Name: Jacob Larsen MRN: [id number] DOB: 04-09-1951 Today's Date: 12/28/2016    History of Present Illness Pt is a 66 yo male who underwent Bilateral L5 decompressive laminotomies with bilateral resection of adherent synovial cyst, microdissection on 4/17. PMH: anxiety, BPH, DD, bilat foot drop, gerd. PSH: back surgery x 2, SHLD arthrooscopy, cervical fusion.    PT Comments    Pt progressing well towards physical therapy goals. Was able to perform transfers and ambulation with gross supervision for safety. Pt was educated on car transfer, walking program, precautions, and brace application. Will continue to follow and progress as able per POC.     Follow Up Recommendations  No PT follow up;Supervision/Assistance - 24 hour (outpt PT when appropriate by MD)     Equipment Recommendations  Cane    Recommendations for Other Services       Precautions / Restrictions Precautions Precautions: Back Precaution Booklet Issued: Yes (comment) Precaution Comments: pt and spouse with verbal understanding Restrictions Weight Bearing Restrictions: No    Mobility  Bed Mobility Overal bed mobility: Needs Assistance Bed Mobility: Rolling;Sidelying to Sit Rolling: Modified independent (Device/Increase time) Sidelying to sit: Supervision       General bed mobility comments: HOB flat and rails lowered to simulate home environment. No assist required.   Transfers Overall transfer level: Needs assistance Equipment used: Rolling walker (2 wheeled) Transfers: Sit to/from Stand Sit to Stand: Supervision         General transfer comment: VC's for maintenance of precautions. Pt was able to power-up to full stand without assistance.   Ambulation/Gait Ambulation/Gait assistance: Supervision Ambulation Distance (Feet): 350 Feet Assistive device: None Gait Pattern/deviations: Step-through pattern;Decreased stride length;Narrow base of support Gait  velocity: Decreased slightly Gait velocity interpretation: Below normal speed for age/gender General Gait Details: attempted to amb without AD however pt very unsteady with narrow base of support and reaching for objects to hold with decreased step length. Pt with improved fluidity of gait and abiltiy to maintain upright posture, v/c's to contract abdominal muscles to support back   Stairs Stairs: Yes   Stair Management: Alternating pattern;Forwards;No rails Number of Stairs: 3 General stair comments: VC's for general safety awareness.   Wheelchair Mobility    Modified Rankin (Stroke Patients Only)       Balance                                            Cognition Arousal/Alertness: Awake/alert Behavior During Therapy: WFL for tasks assessed/performed Overall Cognitive Status: Within Functional Limits for tasks assessed                                        Exercises      General Comments        Pertinent Vitals/Pain Pain Assessment: Faces Faces Pain Scale: Hurts a little bit Pain Location: back Pain Descriptors / Indicators: Sore;Operative site guarding Pain Intervention(s): Monitored during session    Home Living                      Prior Function            PT Goals (current goals can now be found in the care plan section) Acute Rehab PT Goals Patient Stated  Goal: home PT Goal Formulation: With patient/family Time For Goal Achievement: 01/03/17 Potential to Achieve Goals: Good Progress towards PT goals: Progressing toward goals    Frequency    Min 5X/week      PT Plan Current plan remains appropriate    Co-evaluation             End of Session Equipment Utilized During Treatment: Back brace;Gait belt Activity Tolerance: Patient tolerated treatment well Patient left: in bed;with call bell/phone within reach;with family/visitor present Nurse Communication: Mobility status PT Visit Diagnosis:  Difficulty in walking, not elsewhere classified (R26.2);Pain Pain - part of body:  (back)     Time: 6761-9509 PT Time Calculation (min) (ACUTE ONLY): 19 min  Charges:  $Gait Training: 8-22 mins                    G Codes:       Rolinda Roan, PT, DPT Acute Rehabilitation Services Pager: [place] 12/28/2016, 10:00 AM

## 2016-12-28 NOTE — Evaluation (Signed)
Occupational Therapy Evaluation Patient Details Name: Jacob Larsen MRN: [id number] DOB: 1950-11-07 Today's Date: 12/28/2016    History of Present Illness Pt is a 66 yo male who underwent Bilateral L5 decompressive laminotomies with bilateral resection of adherent synovial cyst, microdissection on 4/17. PMH: anxiety, BPH, DD, bilat foot drop, gerd. PSH: back surgery x 2, SHLD arthrooscopy, cervical fusion.   Clinical Impression   Patient evaluated by Occupational Therapy with no further acute OT needs identified. All education has been completed and the patient has no further questions. See below for any follow-up Occupational Therapy or equipment needs. OT to sign off. Thank you for referral.      Follow Up Recommendations  No OT follow up    Equipment Recommendations  None recommended by OT    Recommendations for Other Services       Precautions / Restrictions Precautions Precautions: Back Precaution Booklet Issued: Yes (comment) Precaution Comments: pt and spouse with verbal understanding Restrictions Weight Bearing Restrictions: No      Mobility Bed Mobility               General bed mobility comments: sitting EOB on arrival  Transfers Overall transfer level: Needs assistance   Transfers: Sit to/from Stand Sit to Stand: Supervision              Balance                                           ADL either performed or assessed with clinical judgement   ADL Overall ADL's : Modified independent                                       General ADL Comments: able to verbalize precautions and demonstrate crossing bil LE. pt currently fully dresed   Pt educated on bathing and avoid washing directly on incision. Pt educated to use new wash cloth and towel each day. Pt educated to allow water to run across dressing and not to soak in a tub at this time. Pt advised RN will instruct on any bandages required otherwise is open  to air.   Back handout provided and reviewed adls in detail. Pt educated on: clothing between brace, never sleep in brace, set an alarm at night for medication, avoid sitting for long periods of time, correct bed positioning for sleeping, correct sequence for bed mobility, avoiding lifting more than 5 pounds and never wash directly over incision. All education is complete and patient indicates understanding.    Vision         Perception     Praxis      Pertinent Vitals/Pain Faces Pain Scale: Hurts a little bit Pain Location: back Pain Descriptors / Indicators: Sore;Operative site guarding     Hand Dominance Right   Extremity/Trunk Assessment Upper Extremity Assessment Upper Extremity Assessment: Overall WFL for tasks assessed   Lower Extremity Assessment Lower Extremity Assessment: Defer to PT evaluation   Cervical / Trunk Assessment Cervical / Trunk Assessment: Other exceptions Cervical / Trunk Exceptions: back surgery   Communication Communication Communication: No difficulties   Cognition Arousal/Alertness: Awake/alert Behavior During Therapy: WFL for tasks assessed/performed Overall Cognitive Status: Within Functional Limits for tasks assessed  General Comments  discussed showering and use of clean linens. pt currently without power but has a generator. they hope to have power by Thursday    Exercises     Shoulder Instructions      Home Living Family/patient expects to be discharged to:: Private residence Living Arrangements: Spouse/significant other Available Help at Discharge: Family;Available 24 hours/day Type of Home: House Home Access: Stairs to enter [employer] of Steps: 1 Entrance Stairs-Rails: None Home Layout: One level     Bathroom Shower/Tub: Occupational psychologist: Standard Bathroom Accessibility: Yes How Accessible: Accessible via walker Home Equipment: [place] - 2  wheels;Shower seat   Additional Comments: likes to fish      Prior Functioning/Environment Level of Independence: Independent with assistive device(s)        Comments: started using a walker due to pain and L LE weakness        OT Problem List:        OT Treatment/Interventions:      OT Goals(Current goals can be found in the care plan section) Acute Rehab OT Goals Patient Stated Goal: home  OT Frequency:     Barriers to D/C:            Co-evaluation              End of Session Equipment Utilized During Treatment: Gait belt;Back brace Nurse Communication: Mobility status;Precautions  Activity Tolerance: Patient tolerated treatment well Patient left: in bed;with call bell/phone within reach;with family/visitor present  OT Visit Diagnosis: Unsteadiness on feet (R26.81)                Time: 3568-6168 OT Time Calculation (min): 16 min Charges:  OT General Charges $OT Visit: 1 Procedure OT Evaluation $OT Eval Moderate Complexity: 1 Procedure G-Codes:      Jeri Modena   OTR/L Pager: [phone] Office: [phone] .   Parke Poisson B 12/28/2016, 2:14 PM

## 2016-12-28 NOTE — Discharge Summary (Signed)
Physician Discharge Summary  Patient ID: KAO CONRY MRN: [id number] DOB/AGE: 1951-01-30 66 y.o.  Admit date: 12/27/2016 Discharge date: 12/28/2016  Admission Diagnoses:  Discharge Diagnoses:  Active Problems:   S/P lumbar spinal fusion   Discharged Condition: good  Hospital Course: Patient admitted the hospital where he underwent uncompensated L5-S1 decompression and fusion. Postoperative use doing very well. Back and lower extremity pain much improved. Standing and ambulate without difficulty. Ready for discharge home.  Consults:   Significant Diagnostic Studies:   Treatments:   Discharge Exam: Blood pressure 104/61, pulse 96, temperature 99.4 F (37.4 C), resp. rate 18, weight 91.6 kg (202 lb), SpO2 100 %. Awake and alert. Oriented and appropriate cranial Nerve function intact. Motor and sensory function extremities normal. Wound clean and dry area chest benign.  Disposition: 01-Home or Self Care  Discharge Instructions    Incentive spirometry RT    Complete by:  As directed      Allergies as of 12/28/2016      Reactions   Other Hives   Beef-hives RED MEAT   Aspirin Hives, Rash   Salicylates Rash      Medication List    TAKE these medications   atorvastatin 80 MG tablet Commonly known as:  LIPITOR Take 40 mg by mouth 3 (three) times a week. Tuesday, Thursday, Saturday   diazepam 5 MG tablet Commonly known as:  VALIUM Take 1-2 tablets (5-10 mg total) by mouth every 6 (six) hours as needed for muscle spasms.   diclofenac 75 MG EC tablet Commonly known as:  VOLTAREN TAKE 1 TABLET BY MOUTH TWICE A DAY   finasteride 5 MG tablet Commonly known as:  PROSCAR TAKE 1 TABLET DAILY FOR PROSTATE   Flax Seed Oil 1000 MG Caps Take 1 capsule (1,000 mg total) by mouth 2 (two) times daily.   fluticasone 50 MCG/ACT nasal spray Commonly known as:  FLONASE PLACE 2 SPRAYS INTO BOTH NOSTRILS DAILY. What changed:  when to take this  reasons to take this    loratadine 10 MG tablet Commonly known as:  CLARITIN Take 1 tablet (10 mg total) by mouth as needed for allergies.   Magnesium 250 MG Tabs Take 250 mg by mouth daily.   omeprazole 20 MG capsule Commonly known as:  PRILOSEC Take 1 capsule (20 mg total) by mouth daily.   oxyCODONE 15 MG immediate release tablet Commonly known as:  ROXICODONE Take 1 tablet (15 mg total) by mouth every 6 (six) hours as needed.   ranitidine 300 MG tablet Commonly known as:  ZANTAC TAKE 1 TABLET BY MOUTH TWICE A DAY   sildenafil 100 MG tablet Commonly known as:  VIAGRA Take 1 tablet (100 mg total) by mouth as needed for erectile dysfunction.   tamsulosin 0.4 MG Caps capsule Commonly known as:  FLOMAX Take 0.4 mg by mouth daily.   testosterone cypionate 200 MG/ML injection Commonly known as:  DEPOTESTOSTERONE CYPIONATE INJECT 2 MLS INTRAMUSCULARLY EVERY 2 WEEKS *DISCARD VIAL 30 AFTER PUNCTURE*   vitamin B-12 1000 MCG tablet Commonly known as:  CYANOCOBALAMIN Take 1,000 mcg by mouth daily.   Vitamin D 2000 units tablet Take 4,000 Units by mouth daily.            Durable Medical Equipment        Start     Ordered   12/28/16 0935  For home use only DME Cane  Once     12/28/16 0934   12/27/16 1419  DME 3 n 1  Once     12/27/16 1418   12/27/16 1419  DME Walker rolling  Once    Question:  Patient needs a walker to treat with the following condition  Answer:  Herniated nucleus pulposus   12/27/16 1418       Signed: Avonne Berkery,Michal A 12/28/2016, 9:54 AM

## 2016-12-28 NOTE — Discharge Instructions (Signed)

## 2017-01-11 ENCOUNTER — Other Ambulatory Visit: Payer: Self-pay | Admitting: Internal Medicine

## 2017-01-15 ENCOUNTER — Other Ambulatory Visit: Payer: Self-pay | Admitting: Internal Medicine

## 2017-01-15 DIAGNOSIS — N320 Bladder-neck obstruction: Secondary | ICD-10-CM

## 2017-02-09 DIAGNOSIS — M5126 Other intervertebral disc displacement, lumbar region: Secondary | ICD-10-CM | POA: Diagnosis not present

## 2017-02-09 DIAGNOSIS — Z6826 Body mass index (BMI) 26.0-26.9, adult: Secondary | ICD-10-CM | POA: Diagnosis not present

## 2017-02-13 ENCOUNTER — Encounter: Payer: Self-pay | Admitting: Internal Medicine

## 2017-02-13 ENCOUNTER — Ambulatory Visit (INDEPENDENT_AMBULATORY_CARE_PROVIDER_SITE_OTHER): Payer: Medicare Other | Admitting: Internal Medicine

## 2017-02-13 VITALS — BP 122/80 | HR 72 | Temp 97.5°F | Resp 16 | Ht 73.5 in | Wt 206.4 lb

## 2017-02-13 DIAGNOSIS — Z79899 Other long term (current) drug therapy: Secondary | ICD-10-CM | POA: Diagnosis not present

## 2017-02-13 DIAGNOSIS — E559 Vitamin D deficiency, unspecified: Secondary | ICD-10-CM | POA: Diagnosis not present

## 2017-02-13 DIAGNOSIS — E349 Endocrine disorder, unspecified: Secondary | ICD-10-CM | POA: Diagnosis not present

## 2017-02-13 DIAGNOSIS — K219 Gastro-esophageal reflux disease without esophagitis: Secondary | ICD-10-CM

## 2017-02-13 DIAGNOSIS — I10 Essential (primary) hypertension: Secondary | ICD-10-CM

## 2017-02-13 DIAGNOSIS — E782 Mixed hyperlipidemia: Secondary | ICD-10-CM

## 2017-02-13 DIAGNOSIS — R7303 Prediabetes: Secondary | ICD-10-CM | POA: Diagnosis not present

## 2017-02-13 DIAGNOSIS — N401 Enlarged prostate with lower urinary tract symptoms: Secondary | ICD-10-CM | POA: Diagnosis not present

## 2017-02-13 LAB — CBC WITH DIFFERENTIAL/PLATELET
Basophils Absolute: 46 cells/uL (ref 0–200)
Basophils Relative: 1 %
EOS PCT: 3 %
Eosinophils Absolute: 138 cells/uL (ref 15–500)
HCT: 42.8 % (ref 38.5–50.0)
Hemoglobin: 14 g/dL (ref 13.2–17.1)
LYMPHS PCT: 35 %
Lymphs Abs: 1610 cells/uL (ref 850–3900)
MCH: 31.9 pg (ref 27.0–33.0)
MCHC: 32.7 g/dL (ref 32.0–36.0)
MCV: 97.5 fL (ref 80.0–100.0)
MPV: 10.5 fL (ref 7.5–12.5)
Monocytes Absolute: 460 cells/uL (ref 200–950)
Monocytes Relative: 10 %
NEUTROS PCT: 51 %
Neutro Abs: 2346 cells/uL (ref 1500–7800)
PLATELETS: 286 10*3/uL (ref 140–400)
RBC: 4.39 MIL/uL (ref 4.20–5.80)
RDW: 14.4 % (ref 11.0–15.0)
WBC: 4.6 10*3/uL (ref 3.8–10.8)

## 2017-02-13 NOTE — Patient Instructions (Signed)

## 2017-02-13 NOTE — Progress Notes (Signed)
[place]  [facility]   Unk Pinto, M.D.      Uvaldo Bristle. Silverio Lay, P.A.-C [facility]                [address] [place], [place] [address] Telephone [phone] Telefax [phone]     This very nice 66 y.o.  MBM  has been followed for labile HTN, Prediabetes, Hyperlipidemia, BOO/LUTS, Testosterone, GERD and Vitamin D Deficiency. Patient has GERD controlled w/diet and meds. Also has BPH w/LUTS improved with Flomax and has Low Testosterone on replacement with improved sense of well being.      In April he underwent L5-S1 decompression and fusion by Dr Annette Stable. Patient has had # 4 surgeries on his back for DDD/HNP and 2 surgeries on his neck.     Labile HTN predates since 2013. Patient's BP has been controlled at home.  Today's BP is at goal - 122/80. Patient denies any cardiac symptoms as chest pain, palpitations, shortness of breath, dizziness or ankle swelling.     Patient's hyperlipidemia is controlled with diet and medications. Patient denies myalgias or other medication SE's. Last lipids were at goal Lab Results  Component Value Date   CHOL 128 06/20/2016   HDL 43 06/20/2016   LDLCALC 65 06/20/2016   TRIG 102 06/20/2016   CHOLHDL 3.0 06/20/2016      Patient has prediabetes (A1c 6.0% in 2014)  and patient denies reactive hypoglycemic symptoms, visual blurring, diabetic polys or paresthesias. Last A1c was at goal: Lab Results  Component Value Date   HGBA1C 5.4 06/20/2016       Finally, patient has history of Vitamin D Deficiency and last vitamin D was at goal: Lab Results  Component Value Date   VD25OH 79 06/20/2016   Current Outpatient Prescriptions on File Prior to Visit  Medication Sig  . atorvastatin (LIPITOR) 80 MG tablet Take 40 mg by mouth 3 (three) times a week. Tuesday, Thursday, Saturday  . Cholecalciferol (VITAMIN D) 2000 UNITS tablet Take 4,000 Units by mouth daily.   . diazepam  (VALIUM) 5 MG tablet Take 1-2 tablets (5-10 mg total) by mouth every 6 (six) hours as needed for muscle spasms.  . diclofenac (VOLTAREN) 75 MG EC tablet TAKE 1 TABLET BY MOUTH TWICE A DAY  . finasteride (PROSCAR) 5 MG tablet TAKE 1 TABLET DAILY FOR PROSTATE  . Flaxseed, Linseed, (FLAX SEED OIL) 1000 MG CAPS Take 1 capsule (1,000 mg total) by mouth 2 (two) times daily.  . fluticasone (FLONASE) 50 MCG/ACT nasal spray PLACE 2 SPRAYS INTO BOTH NOSTRILS DAILY. (Patient taking differently: Place 2 sprays into both nostrils daily as needed for allergies. )  . loratadine (CLARITIN) 10 MG tablet Take 1 tablet (10 mg total) by mouth as needed for allergies.  . Magnesium 250 MG TABS Take 250 mg by mouth daily.  Marland Kitchen omeprazole (PRILOSEC) 20 MG capsule Take 1 capsule (20 mg total) by mouth daily.  Marland Kitchen oxyCODONE (ROXICODONE) 15 MG immediate release tablet Take 1 tablet (15 mg total) by mouth every 6 (six) hours as needed.  . ranitidine (ZANTAC) 300 MG tablet TAKE 1 TABLET BY MOUTH TWICE A DAY  . sildenafil (VIAGRA) 100 MG tablet Take 1 tablet (100 mg total) by mouth as needed for erectile dysfunction.  . tamsulosin (FLOMAX) 0.4 MG CAPS capsule TAKE 2 CAPSULES DAILY FOR PROSTATE  . testosterone cypionate (DEPOTESTOSTERONE CYPIONATE) 200  MG/ML injection INJECT 2 MLS INTRAMUSCULARLY EVERY 2 WEEKS *DISCARD VIAL 30 AFTER PUNCTURE*  . vitamin B-12 (CYANOCOBALAMIN) 1000 MCG tablet Take 1,000 mcg by mouth daily.   No current facility-administered medications on file prior to visit.    Allergies  Allergen Reactions  . Other Hives    Beef-hives  RED MEAT  . Aspirin Hives and Rash  . Salicylates Rash   Past Medical History:  Diagnosis Date  . Anxiety    takes Valium daily as needed  . Arthritis    in neck  . BPH (benign prostatic hyperplasia)    takes Flomax daily  . Chronic back pain    DDD  . Diverticulosis   . Foot drop    both feet  . GERD (gastroesophageal reflux disease)    takes Omeprazole daily as  well as Zantac  . Hyperlipidemia    takes Atorvastatin daily  . Hypogonadism male   . Nocturia   . Numbness    only in 4th and 5th finger on left hand and left leg  . Seasonal allergies    takes Claritin daily as needed and uses Flonase daily as needed  . Urinary frequency    takes Flomax daily   Health Maintenance  Topic Date Due  . INFLUENZA VACCINE  04/12/2017  . PNA vac Low Risk Adult (2 of 2 - PPSV23) 04/08/2018  . COLONOSCOPY  05/07/2018  . TETANUS/TDAP  05/27/2024  . Hepatitis C Screening  Completed   Immunization History  Administered Date(s) Administered  . Influenza Split 06/18/2015  . Influenza, High Dose Seasonal PF 06/20/2016  . Influenza-Unspecified 08/17/2014  . PPD Test 05/27/2014  . Pneumococcal Conjugate-13 12/09/2015  . Pneumococcal Polysaccharide-23 04/08/2013  . Td 09/13/2003  . Tdap 05/27/2014   Past Surgical History:  Procedure Laterality Date  . BACK SURGERY     lumbar fusion x2  . CERVICAL FUSION  2011/2014  . COLONOSCOPY    . KNEE ARTHROSCOPY     right 1985; left 1992  . SEPTOPLASTY    . SHOULDER ARTHROSCOPY Left 2015  . SHOULDER SURGERY Right 08   Family History  Problem Relation Age of Onset  . Leukemia Mother   . Diabetes Mother   . Hypertension Father   . Diabetes Father   . Colon cancer Neg Hx   . Stomach cancer Neg Hx    Social History   Social History  . Marital status: Married    Spouse name: N/A  . Number of children: N/A  . Years of education: N/A   Occupational History  . retired   Social History Main Topics  . Smoking status: Former Smoker    Quit date: 05/28/1995  . Smokeless tobacco: Never Used     Comment: quit smoking >20+yrs ago  . Alcohol use 0.0 oz/week     Comment: rarely  . Drug use: No  . Sexual activity: Yes    ROS Constitutional: Denies fever, chills, weight loss/gain, headaches, insomnia,  night sweats or change in appetite. Does c/o fatigue. Eyes: Denies redness, blurred vision, diplopia,  discharge, itchy or watery eyes.  ENT: Denies discharge, congestion, post nasal drip, epistaxis, sore throat, earache, hearing loss, dental pain, Tinnitus, Vertigo, Sinus pain or snoring.  Cardio: Denies chest pain, palpitations, irregular heartbeat, syncope, dyspnea, diaphoresis, orthopnea, PND, claudication or edema Respiratory: denies cough, dyspnea, DOE, pleurisy, hoarseness, laryngitis or wheezing.  Gastrointestinal: Denies dysphagia, heartburn, reflux, water brash, pain, cramps, nausea, vomiting, bloating, diarrhea, constipation, hematemesis, melena, hematochezia, jaundice or  hemorrhoids Genitourinary: Denies dysuria, discharge, hematuria or flank pain. Has occas frequency, urgency, nocturia & hesitancy. Musculoskeletal: Denies arthralgia, myalgia, stiffness, Jt. Swelling, pain, limp or strain/sprain. Denies Falls. Skin: Denies puritis, rash, hives, warts, acne, eczema or change in skin lesion Neuro: No weakness, tremor, incoordination, spasms, paresthesia or pain Psychiatric: Denies confusion, memory loss or sensory loss. Denies Depression. Endocrine: Denies change in weight, skin, hair change, nocturia, and paresthesia, diabetic polys, visual blurring or hyper / hypo glycemic episodes.  Heme/Lymph: No excessive bleeding, bruising or enlarged lymph nodes.  Physical Exam  BP 122/80   Pulse 72   Temp 97.5 F (36.4 C)   Resp 16   Ht 6' 1.5" (1.867 m)   Wt 206 lb 6.4 oz (93.6 kg)   BMI 26.86 kg/m   General Appearance: Well nourished and well groomed and in no apparent distress.  Eyes: PERRLA, EOMs, conjunctiva no swelling or erythema, normal fundi and vessels. Sinuses: No frontal/maxillary tenderness ENT/Mouth: EACs patent / TMs  nl. Nares clear without erythema, swelling, mucoid exudates. Oral hygiene is good. No erythema, swelling, or exudate. Tongue normal, non-obstructing. Tonsils not swollen or erythematous. Hearing normal.  Neck: Supple, thyroid normal. No bruits, nodes or  JVD. Respiratory: Respiratory effort normal.  BS equal and clear bilateral without rales, rhonci, wheezing or stridor. Cardio: Heart sounds are normal with regular rate and rhythm and no murmurs, rubs or gallops. Peripheral pulses are normal and equal bilaterally without edema. No aortic or femoral bruits. Chest: symmetric with normal excursions and percussion.  Abdomen: Soft, with Nl bowel sounds. Nontender, no guarding, rebound, hernias, masses, or organomegaly.  Lymphatics: Non tender without lymphadenopathy.  Musculoskeletal: Full ROM all peripheral extremities, joint stability, 5/5 strength, and normal gait. Skin: Warm and dry without rashes, lesions, cyanosis, clubbing or  ecchymosis.  Neuro: Cranial nerves intact, reflexes equal bilaterally. Normal muscle tone, no cerebellar symptoms. Sensation intact.  Pysch: Alert and oriented X 3 with normal affect, insight and judgment appropriate.   Assessment and Plan  1. Essential hypertension  - CBC with Differential/Platelet - BASIC METABOLIC PANEL WITH GFR - Magnesium - TSH  2. Hyperlipidemia, mixed  - Hepatic function panel - Lipid panel - TSH  3. Prediabetes  - Hemoglobin A1c - Insulin, random  4. Vitamin D deficiency   5. Testosterone deficiency  - Testosterone  6. Gastroesophageal reflux disease   7. Benign localized prostatic hyperplasia with lower urinary tract symptoms (LUTS)   8. Medication management  - CBC with Differential/Platelet - BASIC METABOLIC PANEL WITH GFR - Hepatic function panel - Magnesium - Lipid panel - TSH - Hemoglobin A1c - Insulin, random - VITAMIN D 25 Hydroxy  - Testosterone      Patient was counseled in prudent diet, weight control to achieve/maintain BMI less than 25, BP monitoring, regular exercise and medications as discussed.  Discussed med effects and SE's. Routine screening labs and tests as requested with regular follow-up as recommended. Over 25 minutes of exam, counseling,  chart review and complex  decision making was performed

## 2017-02-14 LAB — TESTOSTERONE: Testosterone: 920 ng/dL — ABNORMAL HIGH (ref 250–827)

## 2017-02-14 LAB — BASIC METABOLIC PANEL WITH GFR
BUN: 11 mg/dL (ref 7–25)
CALCIUM: 8.8 mg/dL (ref 8.6–10.3)
CO2: 19 mmol/L — ABNORMAL LOW (ref 20–31)
Chloride: 105 mmol/L (ref 98–110)
Creat: 1.11 mg/dL (ref 0.70–1.25)
GFR, EST AFRICAN AMERICAN: 80 mL/min (ref >=60)
GFR, Est Non African American: 69 mL/min (ref >=60)
Glucose, Bld: 114 mg/dL — ABNORMAL HIGH (ref 65–99)
Potassium: 4 mmol/L (ref 3.5–5.3)
SODIUM: 136 mmol/L (ref 135–146)

## 2017-02-14 LAB — TSH: TSH: 3.31 mIU/L (ref 0.40–4.50)

## 2017-02-14 LAB — HEPATIC FUNCTION PANEL
ALT: 21 U/L (ref 9–46)
AST: 18 U/L (ref 10–35)
Albumin: 3.6 g/dL (ref 3.6–5.1)
Alkaline Phosphatase: 114 U/L (ref 40–115)
BILIRUBIN DIRECT: 0.1 mg/dL (ref <=0.2)
BILIRUBIN INDIRECT: 0.2 mg/dL (ref 0.2–1.2)
BILIRUBIN TOTAL: 0.3 mg/dL (ref 0.2–1.2)
TOTAL PROTEIN: 6.7 g/dL (ref 6.1–8.1)

## 2017-02-14 LAB — VITAMIN D 25 HYDROXY (VIT D DEFICIENCY, FRACTURES): Vit D, 25-Hydroxy: 56 ng/mL (ref 30–100)

## 2017-02-14 LAB — LIPID PANEL
CHOL/HDL RATIO: 4.1 ratio (ref <5.0)
CHOLESTEROL: 143 mg/dL (ref <200)
HDL: 35 mg/dL — ABNORMAL LOW (ref >40)
LDL Cholesterol: 51 mg/dL (ref <100)
TRIGLYCERIDES: 283 mg/dL — AB (ref <150)
VLDL: 57 mg/dL — AB (ref <30)

## 2017-02-14 LAB — HEMOGLOBIN A1C
Hgb A1c MFr Bld: 5.4 % (ref <5.7)
MEAN PLASMA GLUCOSE: 108 mg/dL

## 2017-02-14 LAB — INSULIN, RANDOM: Insulin: 106.3 u[IU]/mL — ABNORMAL HIGH (ref 2.0–19.6)

## 2017-02-14 LAB — MAGNESIUM: Magnesium: 2 mg/dL (ref 1.5–2.5)

## 2017-03-09 DIAGNOSIS — Z6825 Body mass index (BMI) 25.0-25.9, adult: Secondary | ICD-10-CM | POA: Diagnosis not present

## 2017-03-09 DIAGNOSIS — I10 Essential (primary) hypertension: Secondary | ICD-10-CM | POA: Diagnosis not present

## 2017-03-09 DIAGNOSIS — M5126 Other intervertebral disc displacement, lumbar region: Secondary | ICD-10-CM | POA: Diagnosis not present

## 2017-03-19 ENCOUNTER — Other Ambulatory Visit: Payer: Self-pay | Admitting: Internal Medicine

## 2017-03-24 ENCOUNTER — Telehealth: Payer: Self-pay | Admitting: Physician Assistant

## 2017-03-24 MED ORDER — AZITHROMYCIN 250 MG PO TABS: ORAL_TABLET | ORAL | 1 refills | Status: AC

## 2017-03-24 MED ORDER — PREDNISONE 20 MG PO TABS: ORAL_TABLET | ORAL | 0 refills | Status: DC

## 2017-03-24 NOTE — Telephone Encounter (Signed)
Calling with cough, sore throat, fever, sinus pressure over a week, failing nyquil/dayquil.   Will send in zpak/prednisone, if not better needs OV Of worse go to ER

## 2017-03-27 NOTE — Telephone Encounter (Signed)
INFORMED PT THAT RX WAS CALLED INTO PHARMACY. PT REPORTS HE HIS PICKED UP MEDS & FEELS A LITTLE BETTER TODAY. STATES HE WILL COME IN OFFICE IF HE IS NOT DOING BETTER.

## 2017-03-30 ENCOUNTER — Other Ambulatory Visit: Payer: Self-pay | Admitting: Internal Medicine

## 2017-03-30 NOTE — Addendum Note (Signed)
Addendum  created 03/30/17 1739 by Liberato Stansbery, MD   Sign clinical note    

## 2017-04-03 ENCOUNTER — Other Ambulatory Visit: Payer: Self-pay

## 2017-04-03 ENCOUNTER — Other Ambulatory Visit: Payer: Self-pay | Admitting: Physician Assistant

## 2017-04-03 ENCOUNTER — Ambulatory Visit (HOSPITAL_COMMUNITY)
Admission: RE | Admit: 2017-04-03 | Discharge: 2017-04-03 | Disposition: A | Discharge status: Home / Self Care | Payer: Medicare Other | Source: Ambulatory Visit | Attending: Physician Assistant | Admitting: Physician Assistant

## 2017-04-03 ENCOUNTER — Ambulatory Visit: Payer: Self-pay | Admitting: Physician Assistant

## 2017-04-03 ENCOUNTER — Ambulatory Visit (INDEPENDENT_AMBULATORY_CARE_PROVIDER_SITE_OTHER): Payer: Medicare Other | Admitting: Physician Assistant

## 2017-04-03 ENCOUNTER — Ambulatory Visit (HOSPITAL_COMMUNITY): Payer: Medicare Other

## 2017-04-03 ENCOUNTER — Encounter: Payer: Self-pay | Admitting: Physician Assistant

## 2017-04-03 ENCOUNTER — Encounter (HOSPITAL_COMMUNITY): Payer: Self-pay

## 2017-04-03 DIAGNOSIS — J449 Chronic obstructive pulmonary disease, unspecified: Secondary | ICD-10-CM | POA: Insufficient documentation

## 2017-04-03 DIAGNOSIS — R05 Cough: Secondary | ICD-10-CM

## 2017-04-03 DIAGNOSIS — R059 Cough, unspecified: Secondary | ICD-10-CM

## 2017-04-03 MED ORDER — FINASTERIDE 5 MG PO TABS: ORAL_TABLET | ORAL | 1 refills | Status: DC

## 2017-04-03 MED ORDER — BENZONATATE 100 MG PO CAPS: 100.0000 mg | ORAL_CAPSULE | Freq: Three times a day (TID) | ORAL | 0 refills | Status: DC | PRN

## 2017-04-03 NOTE — Patient Instructions (Signed)
HOME CARE INSTRUCTIONS   Do not stand or sit in one position for long periods of time. Do not sit with your legs crossed. Rest with your legs raised during the day.  Your legs have to be higher than your heart so that gravity will force the valves to open, so please really elevate your legs.   Wear elastic stockings or support hose. Do not wear other tight, encircling garments around the legs, pelvis, or waist.  [employer]  has a wide variety of well priced compression stockings. [place], [place] [address] [phone]  - you can also find them on [facility] as much as possible to increase blood flow.  Raise the foot of your bed at night with 2-inch blocks. SEEK MEDICAL CARE IF:   The skin around your ankle starts to break down.  You have pain, redness, tenderness, or hard swelling developing in your leg over a vein.  You are uncomfortable due to leg pain. Document Released: 06/08/2005 Document Revised: 11/21/2011 Document Reviewed: 10/25/2010 [facility] Patient Information 2014 [place].  HOW TO TREAT VIRAL COUGH AND COLD SYMPTOMS:  -Symptoms usually last at least 1 week with the worst symptoms being around day 4.  - colds usually start with a sore throat and end with a cough, and the cough can take 2 weeks to get better.  -No antibiotics are needed for colds, flu, sore throats, cough, bronchitis UNLESS symptoms are longer than 7 days OR if you are getting better then get drastically worse.  -There are a lot of combination medications (Dayquil, Nyquil, Vicks 44, tyelnol cold and sinus, ETC). Please look at the ingredients on the back so that you are treating the correct symptoms and not doubling up on medications/ingredients.    Medicines you can use  Nasal congestion  - pseudoephedrine (Sudafed)- behind the counter, do not use if you have high blood pressure, medicine that have -D in them.  - phenylephrine (Sudafed PE) -Dextormethorphan +  chlorpheniramine (Coridcidin HBP)- okay if you have high blood pressure -Oxymetazoline (Afrin) nasal spray- LIMIT to 3 days -Saline nasal spray -Neti pot (used distilled or bottled water)  Ear pain/congestion  -pseudoephedrine (sudafed) - Nasonex/flonase nasal spray  Fever  -Acetaminophen (Tyelnol) -Ibuprofen (Advil, motrin, aleve)  Sore Throat  -Acetaminophen (Tyelnol) -Ibuprofen (Advil, motrin, aleve) -Drink a lot of water -Gargle with salt water - Rest your voice (don't talk) -Throat sprays -Cough drops  Body Aches  -Acetaminophen (Tyelnol) -Ibuprofen (Advil, motrin, aleve)  Headache  -Acetaminophen (Tyelnol) -Ibuprofen (Advil, motrin, aleve) - Exedrin, Exedrin Migraine  Allergy symptoms (cough, sneeze, runny nose, itchy eyes) -Claritin or loratadine cheapest but likely the weakest  -Zyrtec or certizine at night because it can make you sleepy -The strongest is allegra or fexafinadine  Cheapest at [employer], [employer], [employer]  Cough  -Dextromethorphan (Delsym)- medicine that has DM in it -Guafenesin (Mucinex/Robitussin) - cough drops - drink lots of water  Chest Congestion  -Guafenesin (Mucinex/Robitussin)  Red Itchy Eyes  - Naphcon-A  Upset Stomach  - Bland diet (nothing spicy, greasy, fried, and high acid foods like tomatoes, oranges, berries) -OKAY- cereal, bread, soup, crackers, rice -Eat smaller more frequent meals -reduce caffeine, no alcohol -Loperamide (Imodium-AD) if diarrhea -Prevacid for heart burn  General health when sick  -Hydration -wash your hands frequently -keep surfaces clean -change pillow cases and sheets often -Get fresh air but do not exercise strenuously -Vitamin D, double up on it - Vitamin C -Zinc

## 2017-04-03 NOTE — Progress Notes (Signed)
Subjective:    Patient ID: Jacob Larsen, male    DOB: [birth year], 66 y.o.   MRN: [id number]  HPI 66 y.o. former smoker 30 pack year smoking Q 1996 AAM presents with cough, he was given zpak/prednisone on 07/13 and called stating he still had cough and wanted another ABX so he was informed to make an appointment. He states he is feeling better, no fever, chills, he states he still has sinus congest and cough.  CXR 04/2015 showing hyperinflation.   Blood pressure 128/80, pulse (!) 103, temperature 98.1 F (36.7 C), resp. rate 16, height 6' 1.5" (1.867 m), weight 200 lb (90.7 kg), SpO2 98 %.  Medications Current Outpatient Prescriptions on File Prior to Visit  Medication Sig  . atorvastatin (LIPITOR) 80 MG tablet TAKE 1 TABLET (80 MG TOTAL) BY MOUTH DAILY. FOR CHOLESTEROL  . Cholecalciferol (VITAMIN D) 2000 UNITS tablet Take 4,000 Units by mouth daily.   . diazepam (VALIUM) 5 MG tablet Take 1-2 tablets (5-10 mg total) by mouth every 6 (six) hours as needed for muscle spasms.  . diclofenac (VOLTAREN) 75 MG EC tablet TAKE 1 TABLET BY MOUTH TWICE A DAY  . Flaxseed, Linseed, (FLAX SEED OIL) 1000 MG CAPS Take 1 capsule (1,000 mg total) by mouth 2 (two) times daily.  . fluticasone (FLONASE) 50 MCG/ACT nasal spray PLACE 2 SPRAYS INTO BOTH NOSTRILS DAILY. (Patient taking differently: Place 2 sprays into both nostrils daily as needed for allergies. )  . loratadine (CLARITIN) 10 MG tablet Take 1 tablet (10 mg total) by mouth as needed for allergies.  . Magnesium 250 MG TABS Take 250 mg by mouth daily.  Marland Kitchen omeprazole (PRILOSEC) 20 MG capsule Take 1 capsule (20 mg total) by mouth daily.  Marland Kitchen oxyCODONE (ROXICODONE) 15 MG immediate release tablet Take 1 tablet (15 mg total) by mouth every 6 (six) hours as needed.  . predniSONE (DELTASONE) 20 MG tablet 2 tablets daily for 3 days, 1 tablet daily for 4 days.  . ranitidine (ZANTAC) 300 MG tablet TAKE 1 TABLET BY MOUTH TWICE A DAY  . sildenafil (VIAGRA) 100 MG  tablet Take 1 tablet (100 mg total) by mouth as needed for erectile dysfunction.  . tamsulosin (FLOMAX) 0.4 MG CAPS capsule TAKE 2 CAPSULES DAILY FOR PROSTATE  . testosterone cypionate (DEPOTESTOSTERONE CYPIONATE) 200 MG/ML injection INJECT 2 MLS INTRAMUSCULARLY EVERY 2 WEEKS *DISCARD VIAL 30 AFTER PUNCTURE*  . vitamin B-12 (CYANOCOBALAMIN) 1000 MCG tablet Take 1,000 mcg by mouth daily.   No current facility-administered medications on file prior to visit.     Problem list He has Diverticulosis of large intestine; Essential hypertension; Mixed hyperlipidemia; Prediabetes; Reflux esophagitis; Vitamin D deficiency; Impotence of organic origin; Lumbar degenerative disc disease; Testosterone deficiency; Spondylolisthesis of lumbar region; Medication management; and S/P lumbar spinal fusion on his problem list.   Review of Systems  Constitutional: Negative for chills, diaphoresis and fever.  HENT: Positive for postnasal drip and rhinorrhea. Negative for congestion, ear pain, sinus pressure, sneezing, sore throat, trouble swallowing and voice change.   Eyes: Negative.   Respiratory: Positive for cough. Negative for chest tightness and shortness of breath.   Cardiovascular: Negative.   Gastrointestinal: Negative.   Genitourinary: Negative.   Musculoskeletal: Negative.  Negative for neck pain.  Neurological: Negative.  Negative for headaches.       Objective:   Physical Exam  Constitutional: He is oriented to person, place, and time. He appears well-developed and well-nourished.  HENT:  Head: Normocephalic and atraumatic.  Right Ear: External ear normal.  Left Ear: External ear normal.  Mouth/Throat: Oropharynx is clear and moist.  Eyes: Pupils are equal, round, and reactive to light. Conjunctivae and EOM are normal.  Neck: Normal range of motion. Neck supple.  Cardiovascular: Normal rate, regular rhythm and normal heart sounds.   Pulmonary/Chest: Effort normal and breath sounds normal.   Abdominal: Soft. Bowel sounds are normal.  Musculoskeletal: Normal range of motion.  Neurological: He is alert and oriented to person, place, and time. No cranial nerve deficit.  Skin: Skin is warm and dry.  Psychiatric: He has a normal mood and affect. His behavior is normal.      Assessment & Plan:    Chronic obstructive pulmonary disease, unspecified COPD type ([place]) Likely no need for ABX at this time, residual cough/inflammation, breo given, cough medication, get CXR, call if not better.  -     DG Chest 2 View; Future -     benzonatate (TESSALON PERLES) 100 MG capsule; Take 1 capsule (100 mg total) by mouth 3 (three) times daily as needed for cough (Max: 600mg  per day).

## 2017-04-03 NOTE — Progress Notes (Signed)
LVM for pt to return office call for LAB results.

## 2017-04-04 NOTE — Progress Notes (Signed)
Pt made aware of CXR & voiced understanding

## 2017-04-26 ENCOUNTER — Other Ambulatory Visit: Payer: Self-pay | Admitting: Internal Medicine

## 2017-04-26 MED ORDER — TESTOSTERONE CYPIONATE 200 MG/ML IM SOLN: INTRAMUSCULAR | 2 refills | Status: DC

## 2017-04-26 NOTE — Telephone Encounter (Signed)
-   Please call  Depo Testosterone

## 2017-04-27 NOTE — Progress Notes (Signed)
testostrone called into pharmacy on 16th Aug 2018 at 8:23am by DD

## 2017-05-25 DIAGNOSIS — M5416 Radiculopathy, lumbar region: Secondary | ICD-10-CM | POA: Diagnosis not present

## 2017-05-25 DIAGNOSIS — Z6826 Body mass index (BMI) 26.0-26.9, adult: Secondary | ICD-10-CM | POA: Diagnosis not present

## 2017-05-25 DIAGNOSIS — I10 Essential (primary) hypertension: Secondary | ICD-10-CM | POA: Diagnosis not present

## 2017-06-04 NOTE — Progress Notes (Signed)
Patient ID: Jacob Larsen, male   DOB: [birth year], 66 y.o.   MRN: [id number]  MEDICARE ANNUAL WELLNESS VISIT AND FOLLOW UP Assessment:    Essential hypertension - continue medications, DASH diet, exercise and monitor at home. Call if greater than 130/80.  -     CBC with Differential/Platelet -     BASIC METABOLIC PANEL WITH GFR -     Hepatic function panel -     TSH  Chronic obstructive pulmonary disease, unspecified COPD type ([place]) Avoid triggers, monitor.   Reflux esophagitis Continue PPI/H2 blocker, diet discussed  Lumbar degenerative disc disease Continue follow up  Mixed hyperlipidemia -     Lipid panel -continue medications, check lipids, decrease fatty foods, increase activity.   Prediabetes -     Hemoglobin A1c Discussed general issues about diabetes pathophysiology and management., Educational material distributed., Suggested low cholesterol diet., Encouraged aerobic exercise., Discussed foot care., Reminded to get yearly retinal exam.  Medication management -     Magnesium  Vitamin D deficiency Continue supplement  Testosterone deficiency Hypogonadism- continue replacement therapy, check testosterone levels as needed.   S/P lumbar spinal fusion Continue follow up  Diverticulosis of large intestine without hemorrhage Increase fiber, no symptoms at this time  Impotence of organic origin Continue follow up  Spondylolisthesis of lumbar region Continue follow up  Needs flu shot -     Flu vaccine HIGH DOSE PF  Need for prophylactic vaccination against Streptococcus pneumoniae (pneumococcus) -     Pneumococcal polysaccharide vaccine 23-valent greater than or equal to 2yo subcutaneous/IM  Over 30 minutes of exam, counseling, chart review, and critical decision making was performed Future Appointments Date Time Provider [place]  09/25/2017 11:00 AM Vicie Mutters, PA-C [facility] None    Plan:   During the course of the visit the patient was  educated and counseled about appropriate screening and preventive services including:    Pneumococcal vaccine   Influenza vaccine  Prevnar 13  Td vaccine  Screening electrocardiogram  Colorectal cancer screening  Diabetes screening  Glaucoma screening  Nutrition counseling    Subjective:  Jacob Larsen is a 66 y.o. AA male who presents for Medicare Annual Wellness Visit and 3 month follow up for HTN, hyperlipidemia, prediabetes, and vitamin D Def.   His blood pressure has been controlled at home, today their BP is BP: 130/80 He does not workout. He denies chest pain, shortness of breath, dizziness.   He has COPD.  Following with Dr. Trenton Gammon for his back.  He is on cholesterol medication and denies myalgias. His cholesterol is at goal. The cholesterol last visit was:   Lab Results  Component Value Date   CHOL 143 02/13/2017   HDL 35 (L) 02/13/2017   LDLCALC 51 02/13/2017   TRIG 283 (H) 02/13/2017   CHOLHDL 4.1 02/13/2017   He has been working on diet and exercise for prediabetes, and denies foot ulcerations, hyperglycemia, hypoglycemia , increased appetite, nausea, polydipsia, polyuria, visual disturbances, vomiting and weight loss. Last A1C in the office was:  Lab Results  Component Value Date   HGBA1C 5.4 02/13/2017   Last GFR Lab Results  Component Value Date   GFRAA 80 02/13/2017   Patient is on Vitamin D supplement.   Lab Results  Component Value Date   VD25OH 56 02/13/2017     BMI is Body mass index is 26.13 kg/m., he is working on diet and exercise. Wt Readings from Last 3 Encounters:  06/06/17 200 lb 12.8 oz (  91.1 kg)  04/03/17 200 lb (90.7 kg)  02/13/17 206 lb 6.4 oz (93.6 kg)    Medication Review: Current Outpatient Prescriptions on File Prior to Visit  Medication Sig Dispense Refill  . atorvastatin (LIPITOR) 80 MG tablet TAKE 1 TABLET (80 MG TOTAL) BY MOUTH DAILY. FOR CHOLESTEROL 90 tablet 1  . Cholecalciferol (VITAMIN D) 2000 UNITS tablet Take  4,000 Units by mouth daily.     . diazepam (VALIUM) 5 MG tablet Take 1-2 tablets (5-10 mg total) by mouth every 6 (six) hours as needed for muscle spasms. 60 tablet 0  . diclofenac (VOLTAREN) 75 MG EC tablet TAKE 1 TABLET BY MOUTH TWICE A DAY 180 tablet 1  . finasteride (PROSCAR) 5 MG tablet TAKE 1 TABLET DAILY FOR PROSTATE 90 tablet 1  . Flaxseed, Linseed, (FLAX SEED OIL) 1000 MG CAPS Take 1 capsule (1,000 mg total) by mouth 2 (two) times daily.  0  . fluticasone (FLONASE) 50 MCG/ACT nasal spray PLACE 2 SPRAYS INTO BOTH NOSTRILS DAILY. (Patient taking differently: Place 2 sprays into both nostrils daily as needed for allergies. ) 16 g 3  . loratadine (CLARITIN) 10 MG tablet Take 1 tablet (10 mg total) by mouth as needed for allergies. 90 tablet 4  . Magnesium 250 MG TABS Take 250 mg by mouth daily.    Marland Kitchen omeprazole (PRILOSEC) 20 MG capsule Take 1 capsule (20 mg total) by mouth daily. 90 capsule 2  . oxyCODONE (ROXICODONE) 15 MG immediate release tablet Take 1 tablet (15 mg total) by mouth every 6 (six) hours as needed. 60 tablet 0  . ranitidine (ZANTAC) 300 MG tablet TAKE 1 TABLET BY MOUTH TWICE A DAY 180 tablet 1  . sildenafil (VIAGRA) 100 MG tablet Take 1 tablet (100 mg total) by mouth as needed for erectile dysfunction. 30 tablet 1  . tamsulosin (FLOMAX) 0.4 MG CAPS capsule TAKE 2 CAPSULES DAILY FOR PROSTATE 180 capsule 1  . testosterone cypionate (DEPOTESTOSTERONE CYPIONATE) 200 MG/ML injection INJECT 2 ML IM EVERY 2 WEEKS AS DIRECTED 10 mL 2  . vitamin B-12 (CYANOCOBALAMIN) 1000 MCG tablet Take 1,000 mcg by mouth daily.     No current facility-administered medications on file prior to visit.     Current Problems (verified) Patient Active Problem List   Diagnosis Date Noted  . COPD (chronic obstructive pulmonary disease) ([place]) 04/03/2017  . S/P lumbar spinal fusion 12/27/2016  . Medication management 09/09/2015  . Spondylolisthesis of lumbar region 04/28/2015  . Testosterone deficiency  11/06/2014  . Lumbar degenerative disc disease 09/23/2014  . Essential hypertension 08/13/2013  . Mixed hyperlipidemia 08/13/2013  . Prediabetes 08/13/2013  . Reflux esophagitis 08/13/2013  . Vitamin D deficiency 08/13/2013  . Impotence of organic origin 08/13/2013  . Diverticulosis of large intestine 04/01/2008    Screening Tests Immunization History  Administered Date(s) Administered  . Influenza Split 06/18/2015  . Influenza, High Dose Seasonal PF 06/20/2016, 06/06/2017  . Influenza-Unspecified 08/17/2014  . PPD Test 05/27/2014  . Pneumococcal Conjugate-13 12/09/2015  . Pneumococcal Polysaccharide-23 04/08/2013, 06/06/2017  . Td 09/13/2003  . Tdap 05/27/2014    Preventative care: Last colonoscopy: 04/2008  CT AB 2016 CXR 03/2017 Echo 2010  Prior vaccinations: TD or Tdap: 2015  Influenza: 2018 TODAY  Pneumococcal: 2014 DUE Prevnar13: 2017 Shingles/Zostavax: Declined  Names of Other Physician/Practitioners you currently use: 1. [facility] here for primary care 2. Dr. Sabra Heck, eye doctor, last visit June 2018 3. Dr.  Rona Ravens, dentist, last visit sept 2018  Patient Care Team: Unk Pinto, MD as PCP - General (Internal Medicine) Inda Castle, MD as Consulting Physician (Gastroenterology) Kathie Rhodes, MD as Consulting Physician (Urology) Leeroy Cha, MD as Consulting Physician (Neurosurgery) Barbaraann Cao, OD as Referring Physician (Optometry) Druscilla Brownie, MD as Consulting Physician (Dermatology)  Allergies Allergies  Allergen Reactions  . Other Hives    Beef-hives  RED MEAT  . Aspirin Hives and Rash  . Salicylates Rash    SURGICAL HISTORY He  has a past surgical history that includes Cervical fusion (2011/2012); Knee arthroscopy; Septoplasty; Shoulder arthroscopy (Left, 2015); Shoulder surgery (Right, 08); Colonoscopy; and Back surgery (2016 x 2 L3-L5, 12/2016 S1). FAMILY HISTORY His family  history includes Diabetes in his father and mother; Hypertension in his father; Leukemia in his mother. SOCIAL HISTORY He  reports that he quit smoking about 22 years ago. He has never used smokeless tobacco. He reports that he drinks alcohol. He reports that he does not use drugs.  MEDICARE WELLNESS OBJECTIVES: Physical activity: Current Exercise Habits: The patient does not participate in regular exercise at present Cardiac risk factors: Cardiac Risk Factors include: advanced age (>72men, >58 women);dyslipidemia;hypertension;male gender;sedentary lifestyle Depression/mood screen:   Depression screen [facility] 2/9 06/06/2017  Decreased Interest 0  Down, Depressed, Hopeless 0  PHQ - 2 Score 0    ADLs:  In your present state of health, do you have any difficulty performing the following activities: 06/06/2017 12/20/2016  Hearing? N Y  Vision? N N  Difficulty concentrating or making decisions? N N  Walking or climbing stairs? N Y  Dressing or bathing? N N  Doing errands, shopping? N -  Some recent data might be hidden     Cognitive Testing  Alert? Yes  Normal Appearance?Yes  Oriented to person? Yes  Place? Yes   Time? Yes  Recall of three objects?  1/3  Can perform simple calculations? Yes  Displays appropriate judgment?Yes  Can read the correct time from a watch face?Yes  EOL planning: Does Patient Have a Medical Advance Directive?: Yes Type of Advance Directive: Healthcare Power of Attorney, Living will Copy of Long in Chart?: No - copy requested   Objective:   Today's Vitals   06/06/17 1125  BP: 130/80  Pulse: 100  Resp: 16  Temp: 98.1 F (36.7 C)  SpO2: 95%  Weight: 200 lb 12.8 oz (91.1 kg)  Height: 6' 1.5" (1.867 m)  PainSc: 3   PainLoc: Shoulder   Body mass index is 26.13 kg/m.  General appearance: alert, no distress, WD/WN, male HEENT: normocephalic, sclerae anicteric, TMs pearly, nares patent, no discharge or erythema, pharynx normal Oral  cavity: MMM, no lesions Neck: supple, no lymphadenopathy, no thyromegaly, no masses Heart: RRR, normal S1, S2, no murmurs Lungs: CTA bilaterally, no wheezes, rhonchi, or rales Abdomen: +bs, soft, non tender, non distended, no masses, no hepatomegaly, no splenomegaly Musculoskeletal: nontender, no swelling, no obvious deformity Extremities: no edema, no cyanosis, no clubbing Pulses: 2+ symmetric, upper and lower extremities, normal cap refill Neurological: alert, oriented x 3, CN2-12 intact, strength normal upper extremities and lower extremities, sensation normal throughout, DTRs 2+ throughout, no cerebellar signs, gait normal Psychiatric: normal affect, behavior normal, pleasant   Medicare Attestation I have personally reviewed: The patient's medical and social history Their use of alcohol, tobacco or illicit drugs Their current medications and supplements The patient's functional ability including ADLs,fall risks, home safety risks, cognitive, and hearing and visual impairment Diet and physical activities Evidence for depression  or mood disorders  The patient's weight, height, BMI, and visual acuity have been recorded in the chart.  I have made referrals, counseling, and provided education to the patient based on review of the above and I have provided the patient with a written personalized care plan for preventive services.     Vicie Mutters, PA-C   06/06/2017

## 2017-06-05 DIAGNOSIS — M7541 Impingement syndrome of right shoulder: Secondary | ICD-10-CM | POA: Diagnosis not present

## 2017-06-06 ENCOUNTER — Encounter: Payer: Self-pay | Admitting: Physician Assistant

## 2017-06-06 ENCOUNTER — Ambulatory Visit (INDEPENDENT_AMBULATORY_CARE_PROVIDER_SITE_OTHER): Payer: Medicare Other | Admitting: Physician Assistant

## 2017-06-06 VITALS — BP 130/80 | HR 100 | Temp 98.1°F | Resp 16 | Ht 73.5 in | Wt 200.8 lb

## 2017-06-06 DIAGNOSIS — Z0001 Encounter for general adult medical examination with abnormal findings: Secondary | ICD-10-CM

## 2017-06-06 DIAGNOSIS — K5730 Diverticulosis of large intestine without perforation or abscess without bleeding: Secondary | ICD-10-CM

## 2017-06-06 DIAGNOSIS — Z79899 Other long term (current) drug therapy: Secondary | ICD-10-CM

## 2017-06-06 DIAGNOSIS — N529 Male erectile dysfunction, unspecified: Secondary | ICD-10-CM | POA: Diagnosis not present

## 2017-06-06 DIAGNOSIS — Z981 Arthrodesis status: Secondary | ICD-10-CM | POA: Diagnosis not present

## 2017-06-06 DIAGNOSIS — M51369 Other intervertebral disc degeneration, lumbar region without mention of lumbar back pain or lower extremity pain: Secondary | ICD-10-CM

## 2017-06-06 DIAGNOSIS — K2100 Gastro-esophageal reflux disease with esophagitis, without bleeding: Secondary | ICD-10-CM

## 2017-06-06 DIAGNOSIS — Z23 Encounter for immunization: Secondary | ICD-10-CM

## 2017-06-06 DIAGNOSIS — R7303 Prediabetes: Secondary | ICD-10-CM | POA: Diagnosis not present

## 2017-06-06 DIAGNOSIS — J449 Chronic obstructive pulmonary disease, unspecified: Secondary | ICD-10-CM

## 2017-06-06 DIAGNOSIS — R6889 Other general symptoms and signs: Secondary | ICD-10-CM | POA: Diagnosis not present

## 2017-06-06 DIAGNOSIS — Z0000 Encounter for general adult medical examination without abnormal findings: Secondary | ICD-10-CM

## 2017-06-06 DIAGNOSIS — E559 Vitamin D deficiency, unspecified: Secondary | ICD-10-CM | POA: Diagnosis not present

## 2017-06-06 DIAGNOSIS — E782 Mixed hyperlipidemia: Secondary | ICD-10-CM

## 2017-06-06 DIAGNOSIS — M4316 Spondylolisthesis, lumbar region: Secondary | ICD-10-CM

## 2017-06-06 DIAGNOSIS — E349 Endocrine disorder, unspecified: Secondary | ICD-10-CM | POA: Diagnosis not present

## 2017-06-06 DIAGNOSIS — I10 Essential (primary) hypertension: Secondary | ICD-10-CM | POA: Diagnosis not present

## 2017-06-06 DIAGNOSIS — M5136 Other intervertebral disc degeneration, lumbar region: Secondary | ICD-10-CM

## 2017-06-06 NOTE — Patient Instructions (Addendum)
Due for colonoscopy August 2019  Our lower leg venous system is not the most reliable, the heart does NOT pump fluid up, there is a valve system.  The muscles of the leg squeeze and the blood moves up and a valve opens and close, then they squeeze, blood moves up and valves open and close.  Lots can go wrong with this valve system.  If someone is sitting or standing without movement, everyone will get swelling. So moving is good Decreasing salt and sugar will help Compression stockings will help

## 2017-06-07 LAB — CBC WITH DIFFERENTIAL/PLATELET
BASOS PCT: 0.1 %
Basophils Absolute: 11 cells/uL (ref 0–200)
EOS ABS: 0 {cells}/uL — AB (ref 15–500)
Eosinophils Relative: 0 %
HEMATOCRIT: 42.7 % (ref 38.5–50.0)
HEMOGLOBIN: 15.1 g/dL (ref 13.2–17.1)
LYMPHS ABS: 842 {cells}/uL — AB (ref 850–3900)
MCH: 32.8 pg (ref 27.0–33.0)
MCHC: 35.4 g/dL (ref 32.0–36.0)
MCV: 92.8 fL (ref 80.0–100.0)
MPV: 11.2 fL (ref 7.5–12.5)
Monocytes Relative: 7.2 %
NEUTROS ABS: 9169 {cells}/uL — AB (ref 1500–7800)
Neutrophils Relative %: 84.9 %
Platelets: 236 10*3/uL (ref 140–400)
RBC: 4.6 10*6/uL (ref 4.20–5.80)
RDW: 13.2 % (ref 11.0–15.0)
Total Lymphocyte: 7.8 %
WBC: 10.8 10*3/uL (ref 3.8–10.8)
WBCMIX: 778 {cells}/uL (ref 200–950)

## 2017-06-07 LAB — HEMOGLOBIN A1C
Hgb A1c MFr Bld: 5.7 % of total Hgb — ABNORMAL HIGH (ref <5.7)
MEAN PLASMA GLUCOSE: 117 (calc)
eAG (mmol/L): 6.5 (calc)

## 2017-06-07 LAB — HEPATIC FUNCTION PANEL
AG RATIO: 1.3 (calc) (ref 1.0–2.5)
ALBUMIN MSPROF: 4.2 g/dL (ref 3.6–5.1)
ALT: 29 U/L (ref 9–46)
AST: 26 U/L (ref 10–35)
Alkaline phosphatase (APISO): 96 U/L (ref 40–115)
BILIRUBIN DIRECT: 0.1 mg/dL (ref 0.0–0.2)
GLOBULIN: 3.3 g/dL (ref 1.9–3.7)
Indirect Bilirubin: 0.4 mg/dL (calc) (ref 0.2–1.2)
Total Bilirubin: 0.5 mg/dL (ref 0.2–1.2)
Total Protein: 7.5 g/dL (ref 6.1–8.1)

## 2017-06-07 LAB — BASIC METABOLIC PANEL WITH GFR
BUN: 19 mg/dL (ref 7–25)
CALCIUM: 9.8 mg/dL (ref 8.6–10.3)
CHLORIDE: 104 mmol/L (ref 98–110)
CO2: 23 mmol/L (ref 20–32)
CREATININE: 1.19 mg/dL (ref 0.70–1.25)
GFR, EST AFRICAN AMERICAN: 73 mL/min/{1.73_m2} (ref >=60)
GFR, EST NON AFRICAN AMERICAN: 63 mL/min/{1.73_m2} (ref >=60)
Glucose, Bld: 102 mg/dL — ABNORMAL HIGH (ref 65–99)
Potassium: 4.4 mmol/L (ref 3.5–5.3)
Sodium: 137 mmol/L (ref 135–146)

## 2017-06-07 LAB — LIPID PANEL
CHOL/HDL RATIO: 2.3 (calc) (ref <5.0)
CHOLESTEROL: 143 mg/dL (ref <200)
HDL: 61 mg/dL (ref >40)
LDL CHOLESTEROL (CALC): 67 mg/dL
NON-HDL CHOLESTEROL (CALC): 82 mg/dL (ref <130)
Triglycerides: 73 mg/dL (ref <150)

## 2017-06-07 LAB — TSH: TSH: 0.75 mIU/L (ref 0.40–4.50)

## 2017-06-07 LAB — MAGNESIUM: Magnesium: 2.2 mg/dL (ref 1.5–2.5)

## 2017-06-07 NOTE — Progress Notes (Signed)
Pt aware of lab results & voiced understanding of those results.

## 2017-06-20 ENCOUNTER — Encounter: Payer: Self-pay | Admitting: Internal Medicine

## 2017-07-17 DIAGNOSIS — Z125 Encounter for screening for malignant neoplasm of prostate: Secondary | ICD-10-CM | POA: Diagnosis not present

## 2017-07-17 DIAGNOSIS — N401 Enlarged prostate with lower urinary tract symptoms: Secondary | ICD-10-CM | POA: Diagnosis not present

## 2017-07-17 DIAGNOSIS — R351 Nocturia: Secondary | ICD-10-CM | POA: Diagnosis not present

## 2017-07-17 DIAGNOSIS — N5201 Erectile dysfunction due to arterial insufficiency: Secondary | ICD-10-CM | POA: Diagnosis not present

## 2017-07-25 DIAGNOSIS — M7541 Impingement syndrome of right shoulder: Secondary | ICD-10-CM | POA: Diagnosis not present

## 2017-07-31 DIAGNOSIS — M75121 Complete rotator cuff tear or rupture of right shoulder, not specified as traumatic: Secondary | ICD-10-CM | POA: Diagnosis not present

## 2017-07-31 DIAGNOSIS — M7541 Impingement syndrome of right shoulder: Secondary | ICD-10-CM | POA: Diagnosis not present

## 2017-08-02 ENCOUNTER — Other Ambulatory Visit: Payer: Self-pay | Admitting: Physician Assistant

## 2017-08-09 DIAGNOSIS — L308 Other specified dermatitis: Secondary | ICD-10-CM | POA: Diagnosis not present

## 2017-08-09 DIAGNOSIS — L218 Other seborrheic dermatitis: Secondary | ICD-10-CM | POA: Diagnosis not present

## 2017-08-09 DIAGNOSIS — L821 Other seborrheic keratosis: Secondary | ICD-10-CM | POA: Diagnosis not present

## 2017-08-09 DIAGNOSIS — L309 Dermatitis, unspecified: Secondary | ICD-10-CM | POA: Diagnosis not present

## 2017-08-14 ENCOUNTER — Other Ambulatory Visit: Payer: Self-pay | Admitting: Internal Medicine

## 2017-08-18 DIAGNOSIS — M75121 Complete rotator cuff tear or rupture of right shoulder, not specified as traumatic: Secondary | ICD-10-CM | POA: Diagnosis not present

## 2017-08-18 DIAGNOSIS — G8918 Other acute postprocedural pain: Secondary | ICD-10-CM | POA: Diagnosis not present

## 2017-08-18 DIAGNOSIS — M7521 Bicipital tendinitis, right shoulder: Secondary | ICD-10-CM | POA: Diagnosis not present

## 2017-08-18 DIAGNOSIS — S43431A Superior glenoid labrum lesion of right shoulder, initial encounter: Secondary | ICD-10-CM | POA: Diagnosis not present

## 2017-08-18 DIAGNOSIS — M7541 Impingement syndrome of right shoulder: Secondary | ICD-10-CM | POA: Diagnosis not present

## 2017-08-18 DIAGNOSIS — M94211 Chondromalacia, right shoulder: Secondary | ICD-10-CM | POA: Diagnosis not present

## 2017-08-18 HISTORY — PX: SHOULDER ARTHROSCOPY WITH ROTATOR CUFF REPAIR: SHX5685

## 2017-08-24 DIAGNOSIS — Z6826 Body mass index (BMI) 26.0-26.9, adult: Secondary | ICD-10-CM | POA: Diagnosis not present

## 2017-08-24 DIAGNOSIS — R030 Elevated blood-pressure reading, without diagnosis of hypertension: Secondary | ICD-10-CM | POA: Diagnosis not present

## 2017-08-24 DIAGNOSIS — M5416 Radiculopathy, lumbar region: Secondary | ICD-10-CM | POA: Diagnosis not present

## 2017-08-28 DIAGNOSIS — Z4789 Encounter for other orthopedic aftercare: Secondary | ICD-10-CM | POA: Diagnosis not present

## 2017-09-01 ENCOUNTER — Other Ambulatory Visit: Payer: Self-pay | Admitting: Internal Medicine

## 2017-09-01 DIAGNOSIS — N320 Bladder-neck obstruction: Secondary | ICD-10-CM

## 2017-09-21 NOTE — Progress Notes (Signed)
FOLLOW UP  Assessment and Plan:   Hypertension -Continue medication, monitor blood pressure at home. Continue DASH diet.  Reminder to go to the ER if any CP, SOB, nausea, dizziness, severe HA, changes vision/speech, left arm numbness and tingling and jaw pain.  Cholesterol -Continue diet and exercise. Check cholesterol.    Prediabetes  -Continue diet and exercise. Check A1C  Chronic obstructive pulmonary disease, unspecified COPD type ([place]) No triggers, well controlled symptoms, cont to monitor, discussed with patient  Continue diet and meds as discussed. Further disposition pending results of labs. Over 30 minutes of exam, counseling, chart review, and critical decision making was performed  No future appointments. Will schedule  HPI 67 y.o. male  presents for 3 month follow up on hypertension, cholesterol, prediabetes, and vitamin D deficiency.   Patient had right rotator cuff repair with Dr. Onnie Graham Dec 7th, will request notes. He is still in a sling.   His blood pressure has been controlled at home, today their BP is BP: 122/86   He does not workout. He denies chest pain, shortness of breath, dizziness.   He  is  on cholesterol medication and denies myalgias. His cholesterol is at goal. The cholesterol last visit was:   Lab Results  Component Value Date   CHOL 143 06/06/2017   HDL 61 06/06/2017   LDLCALC 51 02/13/2017   TRIG 73 06/06/2017   CHOLHDL 2.3 06/06/2017    He has been working on diet and exercise for prediabetes, and denies paresthesia of the feet, polydipsia, polyuria and visual disturbances. Last A1C in the office was:  Lab Results  Component Value Date   HGBA1C 5.7 (H) 06/06/2017   Patient is on Vitamin D supplement.   Lab Results  Component Value Date   VD25OH 56 02/13/2017     BMI is Body mass index is 27.07 kg/m., he is working on diet and exercise. Wt Readings from Last 3 Encounters:  09/25/17 208 lb (94.3 kg)  06/06/17 200 lb 12.8 oz (91.1 kg)   04/03/17 200 lb (90.7 kg)   He has a history of testosterone deficiency and is on testosterone replacement. He states that the testosterone helps with his energy, libido, muscle mass. Lab Results  Component Value Date   TESTOSTERONE 920 (H) 02/13/2017    Current Medications:  Current Outpatient Medications on File Prior to Visit  Medication Sig  . atorvastatin (LIPITOR) 80 MG tablet TAKE 1 TABLET (80 MG TOTAL) BY MOUTH DAILY. FOR CHOLESTEROL  . Cholecalciferol (VITAMIN D) 2000 UNITS tablet Take 4,000 Units by mouth daily.   . diclofenac (VOLTAREN) 75 MG EC tablet TAKE 1 TABLET BY MOUTH TWICE A DAY  . finasteride (PROSCAR) 5 MG tablet TAKE 1 TABLET DAILY FOR PROSTATE  . Flaxseed, Linseed, (FLAX SEED OIL) 1000 MG CAPS Take 1 capsule (1,000 mg total) by mouth 2 (two) times daily.  . fluticasone (FLONASE) 50 MCG/ACT nasal spray PLACE 2 SPRAYS INTO BOTH NOSTRILS DAILY. (Patient taking differently: Place 2 sprays into both nostrils daily as needed for allergies. )  . loratadine (CLARITIN) 10 MG tablet Take 1 tablet (10 mg total) by mouth as needed for allergies.  . Magnesium 250 MG TABS Take 250 mg by mouth daily.  Marland Kitchen omeprazole (PRILOSEC) 20 MG capsule TAKE 1 CAPSULE BY MOUTH EVERY DAY  . ranitidine (ZANTAC) 300 MG tablet TAKE 1 TABLET BY MOUTH TWICE A DAY  . tamsulosin (FLOMAX) 0.4 MG CAPS capsule TAKE 2 CAPSULES DAILY FOR PROSTATE  . testosterone cypionate (  DEPOTESTOSTERONE CYPIONATE) 200 MG/ML injection INJECT 2 ML IM EVERY 2 WEEKS AS DIRECTED  . vitamin B-12 (CYANOCOBALAMIN) 1000 MCG tablet Take 1,000 mcg by mouth daily.  . sildenafil (VIAGRA) 100 MG tablet Take 1 tablet (100 mg total) by mouth as needed for erectile dysfunction.   No current facility-administered medications on file prior to visit.     Medical History:  Past Medical History:  Diagnosis Date  . Anxiety    takes Valium daily as needed  . Arthritis    in neck  . BPH (benign prostatic hyperplasia)    takes Flomax  daily  . Chronic back pain    DDD  . Diverticulosis   . Foot drop    both feet  . GERD (gastroesophageal reflux disease)    takes Omeprazole daily as well as Zantac  . Hyperlipidemia    takes Atorvastatin daily  . Hypogonadism male   . Nocturia   . Numbness    only in 4th and 5th finger on left hand and left leg  . Seasonal allergies    takes Claritin daily as needed and uses Flonase daily as needed  . Urinary frequency    takes Flomax daily   Allergies:  Allergies  Allergen Reactions  . Other Hives    Beef-hives  RED MEAT  . Aspirin Hives and Rash  . Salicylates Rash     Review of Systems:  Review of Systems  Constitutional: Negative.   HENT: Negative.   Eyes: Negative.   Respiratory: Negative.   Cardiovascular: Negative.   Gastrointestinal: Negative.   Genitourinary: Negative.   Musculoskeletal: Negative.   Skin: Negative.     Family history- Review and unchanged Social history- Review and unchanged Physical Exam: BP 122/86   Temp 97.9 F (36.6 C)   Resp 16   Ht 6' 1.5" (1.867 m)   Wt 208 lb (94.3 kg) Comment: w/ arm brace  SpO2 97%   BMI 27.07 kg/m  Wt Readings from Last 3 Encounters:  09/25/17 208 lb (94.3 kg)  06/06/17 200 lb 12.8 oz (91.1 kg)  04/03/17 200 lb (90.7 kg)   General Appearance: Well nourished, in no apparent distress. Eyes: PERRLA, EOMs, conjunctiva no swelling or erythema Sinuses: No Frontal/maxillary tenderness ENT/Mouth: Ext aud canals clear, TMs without erythema, bulging. No erythema, swelling, or exudate on post pharynx.  Tonsils not swollen or erythematous. Hearing normal.  Neck: Supple, thyroid normal.  Respiratory: Respiratory effort normal, BS equal bilaterally without rales, rhonchi, wheezing or stridor.  Cardio: RRR with no MRGs. Brisk peripheral pulses without edema.  Abdomen: Soft, + BS,  Non tender, no guarding, rebound, hernias, masses. Lymphatics: Non tender without lymphadenopathy.  Musculoskeletal: Full ROM, 5/5  strength, Normal gait, right arm in a sling Skin: Warm, dry without rashes, lesions, ecchymosis.  Neuro: Cranial nerves intact. Normal muscle tone, no cerebellar symptoms. Psych: Awake and oriented X 3, normal affect, Insight and Judgment appropriate.    Vicie Mutters, PA-C 11:19 AM [facility]

## 2017-09-25 ENCOUNTER — Ambulatory Visit (INDEPENDENT_AMBULATORY_CARE_PROVIDER_SITE_OTHER): Payer: Medicare Other | Admitting: Physician Assistant

## 2017-09-25 ENCOUNTER — Encounter: Payer: Self-pay | Admitting: Physician Assistant

## 2017-09-25 VITALS — BP 122/86 | Temp 97.9°F | Resp 16 | Ht 73.5 in | Wt 208.0 lb

## 2017-09-25 DIAGNOSIS — I10 Essential (primary) hypertension: Secondary | ICD-10-CM

## 2017-09-25 DIAGNOSIS — J449 Chronic obstructive pulmonary disease, unspecified: Secondary | ICD-10-CM | POA: Diagnosis not present

## 2017-09-25 DIAGNOSIS — Z79899 Other long term (current) drug therapy: Secondary | ICD-10-CM

## 2017-09-25 DIAGNOSIS — E782 Mixed hyperlipidemia: Secondary | ICD-10-CM | POA: Diagnosis not present

## 2017-09-25 DIAGNOSIS — R7303 Prediabetes: Secondary | ICD-10-CM

## 2017-09-25 NOTE — Patient Instructions (Addendum)
Can get ranitidine is cheapest [employer], [employer] or [employer]   Or [employer] has a good cheap selection, I like the socks, they are not as hard to get on  Walk as much as possible to increase blood flow.  Raise the foot of your bed at night with 2-inch blocks.   Chronic Obstructive Pulmonary Disease Chronic obstructive pulmonary disease (COPD) is a long-term (chronic) condition that affects the lungs. COPD is a general term that can be used to describe many different lung problems that cause lung swelling (inflammation) and limit airflow, including chronic bronchitis and emphysema. If you have COPD, your lung function will probably never return to normal. In most cases, it gets worse over time. However, there are steps you can take to slow the progression of the disease and improve your quality of life. What are the causes? This condition may be caused by:  Smoking. This is the most common cause.  Certain genes passed down through families.  What increases the risk? The following factors may make you more likely to develop this condition:  Secondhand smoke from cigarettes, pipes, or cigars.  Exposure to chemicals and other irritants such as fumes and dust in the work environment.  Chronic lung conditions or infections.  What are the signs or symptoms? Symptoms of this condition include:  Shortness of breath, especially during physical activity.  Chronic cough with a large amount of thick mucus. Sometimes the cough may not have any mucus (dry cough).  Wheezing.  Rapid breaths.  Gray or bluish discoloration (cyanosis) of the skin, especially in your fingers, toes, or lips.  Feeling tired (fatigue).  Weight loss.  Chest tightness.  Frequent infections.  Episodes when breathing symptoms become much worse (exacerbations).  Swelling in the ankles, feet, or legs. This may occur in later stages of the disease.  How is this diagnosed? This condition is diagnosed based on:  Your  medical history.  A physical exam.  You may also have tests, including:  Lung (pulmonary) function tests. This may include a spirometry test, which measures your ability to exhale properly.  Chest X-ray.  CT scan.  Blood tests.  How is this treated? This condition may be treated with:  Medicines. These may include inhaled rescue medicines to treat acute exacerbations as well as long-term, or maintenance, medicines to prevent flare-ups of COPD. ? Bronchodilators help treat COPD by dilating the airways to allow increased airflow and make your breathing more comfortable. ? Steroids can reduce airway inflammation and help prevent exacerbations.  Smoking cessation. If you smoke, your health care provider may ask you to quit, and may also recommend therapy or replacement products to help you quit.  Pulmonary rehabilitation. This may involve working with a team of health care providers and specialists, such as respiratory, occupational, and physical therapists.  Exercise and physical activity. These are beneficial for nearly all people with COPD.  Nutrition therapy to gain weight, if you are underweight.  Oxygen. Supplemental oxygen therapy is only helpful if you have a low oxygen level in your blood (hypoxemia).  Lung surgery or transplant.  Palliative care. This is to help people with COPD feel comfortable when treatment is no longer working.  Follow these instructions at home: Medicines  Take over-the-counter and prescription medicines (inhaled or pills) only as told by your health care provider.  Talk to your health care provider before taking any cough or allergy medicines. You may need to avoid certain medicines that dry out your airways. Lifestyle  If  you are a smoker, the most important thing that you can do is to stop smoking. Do not use any products that contain nicotine or tobacco, such as cigarettes and e-cigarettes. If you need help quitting, ask your health care  provider. Continuing to smoke will cause the disease to progress faster.  Avoid exposure to things that irritate your lungs, such as smoke, chemicals, and fumes.  Stay active, but balance activity with periods of rest. Exercise and physical activity will help you maintain your ability to do things you want to do.  Learn and use relaxation techniques to manage stress and to control your breathing.  Get the right amount of sleep and get quality sleep. Most adults need 7 or more hours per night.  Eat healthy foods. Eating smaller, more frequent meals and resting before meals may help you maintain your strength. Controlled breathing Learn and use controlled breathing techniques as directed by your health care provider. Controlled breathing techniques include:  Pursed lip breathing. Start by breathing in (inhaling) through your nose for 1 second. Then, purse your lips as if you were going to whistle and breathe out (exhale) through the pursed lips for 2 seconds.  Diaphragmatic breathing. Start by putting one hand on your abdomen just above your waist. Inhale slowly through your nose. The hand on your abdomen should move out. Then purse your lips and exhale slowly. You should be able to feel the hand on your abdomen moving in as you exhale.  Controlled coughing Learn and use controlled coughing to clear mucus from your lungs. Controlled coughing is a series of short, progressive coughs. The steps of controlled coughing are: 1. Lean your head slightly forward. 2. Breathe in deeply using diaphragmatic breathing. 3. Try to hold your breath for 3 seconds. 4. Keep your mouth slightly open while coughing twice. 5. Spit any mucus out into a tissue. 6. Rest and repeat the steps once or twice as needed.  General instructions  Make sure you receive all the vaccines that your health care provider recommends, especially the pneumococcal and influenza vaccines. Preventing infection and hospitalization is  very important when you have COPD.  Use oxygen therapy and pulmonary rehabilitation if directed to by your health care provider. If you require home oxygen therapy, ask your health care provider whether you should purchase a pulse oximeter to measure your oxygen level at home.  Work with your health care provider to develop a COPD action plan. This will help you know what steps to take if your condition gets worse.  Keep other chronic health conditions under control as told by your health care provider.  Avoid extreme temperature and humidity changes.  Avoid contact with people who have an illness that spreads from person to person (is contagious), such as viral infections or pneumonia.  Keep all follow-up visits as told by your health care provider. This is important. Contact a health care provider if:  You are coughing up more mucus than usual.  There is a change in the color or thickness of your mucus.  Your breathing is more labored than usual.  Your breathing is faster than usual.  You have difficulty sleeping.  You need to use your rescue medicines or inhalers more often than expected.  You have trouble doing routine activities such as getting dressed or walking around the house. Get help right away if:  You have shortness of breath while you are resting.  You have shortness of breath that prevents you from: ? Being  able to talk. ? Performing your usual physical activities.  You have chest pain lasting longer than 5 minutes.  Your skin color is more blue (cyanotic) than usual.  You measure low oxygen saturations for longer than 5 minutes with a pulse oximeter.  You have a fever.  You feel too tired to breathe normally. Summary  Chronic obstructive pulmonary disease (COPD) is a long-term (chronic) condition that affects the lungs.  Your lung function will probably never return to normal. In most cases, it gets worse over time. However, there are steps you can  take to slow the progression of the disease and improve your quality of life.  Treatment for COPD may include taking medicines, quitting smoking, pulmonary rehabilitation, and changes to diet and exercise. As the disease progresses, you may need oxygen therapy, a lung transplant, or palliative care.  To help manage your condition, do not smoke, avoid exposure to things that irritate your lungs, stay up to date on all vaccines, and follow your health care provider's instructions for taking medicines. This information is not intended to replace advice given to you by your health care provider. Make sure you discuss any questions you have with your health care provider. Document Released: 06/08/2005 Document Revised: 10/03/2016 Document Reviewed: 10/03/2016 Elsevier Interactive Patient Education  2018 [place] Neuralgia [place] Lathe neuralgia is a type of foot pain in the area closest to your toes. This area is sometimes called the ball of your foot. Morton neuralgia occurs when a branch of a nerve in your foot (digital nerve) becomes compressed. When this happens over a long period of time, the nerve can thicken (neuroma) and cause pain. This usually occurs between the third and fourth toe. Morton neuralgia can come and go but may get worse over time. What are the causes? Your digital nerve can become compressed and stretched at a point where it passes under a thick band of tissue that connects your toes (intermetatarsal ligament). Morton neuralgia can be caused by mild repetitive damage in this area. This type of damage can result from:  Activities such as running or jumping.  Wearing shoes that are too tight.  What increases the risk? You may be at risk for Morton neuralgia if you:  Are male.  Wear high heels.  Wear shoes that are narrow or tight.  Participate in activities that stretch your toes. These include: ? Running. ? [place]. ? Long-distance walking.  What are the  signs or symptoms? The first symptom of Morton neuralgia is pain that spreads from the ball of your foot to your toes. It may feel like you are walking on a marble. Pain usually gets worse with walking and goes away at night. Other symptoms may include numbness and cramping of your toes. How is this diagnosed? Your health care provider will do a physical exam. When doing the exam, your health care provider may:  Squeeze your foot just behind your toe.  Ask you to move your toes to check for pain.  You may also have tests on your foot to confirm the diagnosis. These may include:  An X-ray.  An MRI.  How is this treated? Treatment for Morton neuralgia may be as simple as changing the kind of shoes you wear. Other treatments may include:  Wearing a supportive pad (orthosis) under the front of your foot. This lifts your toe bones and takes pressure off the nerve.  Getting injections of numbing medicine and anti-inflammatory medicine (steroid) in the nerve.  Having surgery to remove part of the thickened nerve.  Follow these instructions at home:  Take medicine only as directed by your health care provider.  Wear soft-soled shoes with a wide toe area.  Stop activities that may be causing pain.  Elevate your foot when resting.  Massage your foot.  Apply ice to the injured area: ? Put ice in a plastic bag. ? Place a towel between your skin and the bag. ? Leave the ice on for 20 minutes, 2-3 times a day.  Keep all follow-up visits as directed by your health care provider. This is important. Contact a health care provider if:  Home care instructions are not helping you get better.  Your symptoms change or get worse. This information is not intended to replace advice given to you by your health care provider. Make sure you discuss any questions you have with your health care provider. Document Released: 12/05/2000 Document Revised: 02/04/2016 Document Reviewed:  10/30/2013 Elsevier Interactive Patient Education  Rein[employer]in.

## 2017-09-26 DIAGNOSIS — Z4789 Encounter for other orthopedic aftercare: Secondary | ICD-10-CM | POA: Insufficient documentation

## 2017-09-26 LAB — MAGNESIUM: Magnesium: 2 mg/dL (ref 1.5–2.5)

## 2017-09-26 LAB — BASIC METABOLIC PANEL WITH GFR
BUN: 14 mg/dL (ref 7–25)
CALCIUM: 9.8 mg/dL (ref 8.6–10.3)
CHLORIDE: 106 mmol/L (ref 98–110)
CO2: 29 mmol/L (ref 20–32)
Creat: 1.08 mg/dL (ref 0.70–1.25)
GFR, Est African American: 82 mL/min/{1.73_m2} (ref >=60)
GFR, Est Non African American: 71 mL/min/{1.73_m2} (ref >=60)
Glucose, Bld: 102 mg/dL — ABNORMAL HIGH (ref 65–99)
Potassium: 4.3 mmol/L (ref 3.5–5.3)
Sodium: 139 mmol/L (ref 135–146)

## 2017-09-26 LAB — HEPATIC FUNCTION PANEL
AG Ratio: 1.3 (calc) (ref 1.0–2.5)
ALBUMIN MSPROF: 4 g/dL (ref 3.6–5.1)
ALT: 18 U/L (ref 9–46)
AST: 19 U/L (ref 10–35)
Alkaline phosphatase (APISO): 102 U/L (ref 40–115)
Bilirubin, Direct: 0.1 mg/dL (ref 0.0–0.2)
GLOBULIN: 3.1 g/dL (ref 1.9–3.7)
Indirect Bilirubin: 0.3 mg/dL (calc) (ref 0.2–1.2)
Total Bilirubin: 0.4 mg/dL (ref 0.2–1.2)
Total Protein: 7.1 g/dL (ref 6.1–8.1)

## 2017-09-26 LAB — CBC WITH DIFFERENTIAL/PLATELET
BASOS PCT: 0.7 %
Basophils Absolute: 29 cells/uL (ref 0–200)
Eosinophils Absolute: 168 cells/uL (ref 15–500)
Eosinophils Relative: 4.1 %
HEMATOCRIT: 46.6 % (ref 38.5–50.0)
Hemoglobin: 15.9 g/dL (ref 13.2–17.1)
LYMPHS ABS: 1140 {cells}/uL (ref 850–3900)
MCH: 32 pg (ref 27.0–33.0)
MCHC: 34.1 g/dL (ref 32.0–36.0)
MCV: 93.8 fL (ref 80.0–100.0)
MPV: 10.9 fL (ref 7.5–12.5)
Monocytes Relative: 10.2 %
Neutro Abs: 2345 cells/uL (ref 1500–7800)
Neutrophils Relative %: 57.2 %
PLATELETS: 216 10*3/uL (ref 140–400)
RBC: 4.97 10*6/uL (ref 4.20–5.80)
RDW: 13.1 % (ref 11.0–15.0)
TOTAL LYMPHOCYTE: 27.8 %
WBC: 4.1 10*3/uL (ref 3.8–10.8)
WBCMIX: 418 {cells}/uL (ref 200–950)

## 2017-09-26 LAB — HEMOGLOBIN A1C
HEMOGLOBIN A1C: 5.5 %{Hb} (ref <5.7)
Mean Plasma Glucose: 111 (calc)
eAG (mmol/L): 6.2 (calc)

## 2017-09-26 LAB — LIPID PANEL
CHOLESTEROL: 128 mg/dL (ref <200)
HDL: 41 mg/dL (ref >40)
LDL CHOLESTEROL (CALC): 66 mg/dL
Non-HDL Cholesterol (Calc): 87 mg/dL (calc) (ref <130)
TRIGLYCERIDES: 120 mg/dL (ref <150)
Total CHOL/HDL Ratio: 3.1 (calc) (ref <5.0)

## 2017-09-26 LAB — TSH: TSH: 3.28 m[IU]/L (ref 0.40–4.50)

## 2017-10-02 ENCOUNTER — Encounter: Payer: Self-pay | Admitting: Physician Assistant

## 2017-10-02 DIAGNOSIS — M25511 Pain in right shoulder: Secondary | ICD-10-CM | POA: Diagnosis not present

## 2017-10-05 ENCOUNTER — Other Ambulatory Visit: Payer: Self-pay | Admitting: Physician Assistant

## 2017-10-05 DIAGNOSIS — M25511 Pain in right shoulder: Secondary | ICD-10-CM | POA: Diagnosis not present

## 2017-10-09 DIAGNOSIS — M25511 Pain in right shoulder: Secondary | ICD-10-CM | POA: Diagnosis not present

## 2017-10-12 DIAGNOSIS — M25511 Pain in right shoulder: Secondary | ICD-10-CM | POA: Diagnosis not present

## 2017-10-17 DIAGNOSIS — M25511 Pain in right shoulder: Secondary | ICD-10-CM | POA: Diagnosis not present

## 2017-10-19 DIAGNOSIS — M25511 Pain in right shoulder: Secondary | ICD-10-CM | POA: Diagnosis not present

## 2017-10-25 ENCOUNTER — Other Ambulatory Visit: Payer: Self-pay | Admitting: Internal Medicine

## 2017-11-01 DIAGNOSIS — M5416 Radiculopathy, lumbar region: Secondary | ICD-10-CM | POA: Diagnosis not present

## 2017-11-01 DIAGNOSIS — Z6826 Body mass index (BMI) 26.0-26.9, adult: Secondary | ICD-10-CM | POA: Diagnosis not present

## 2018-01-12 DIAGNOSIS — M5416 Radiculopathy, lumbar region: Secondary | ICD-10-CM | POA: Diagnosis not present

## 2018-01-18 ENCOUNTER — Other Ambulatory Visit: Payer: Self-pay | Admitting: Internal Medicine

## 2018-01-22 DIAGNOSIS — M5416 Radiculopathy, lumbar region: Secondary | ICD-10-CM | POA: Diagnosis not present

## 2018-01-23 DIAGNOSIS — E663 Overweight: Secondary | ICD-10-CM | POA: Insufficient documentation

## 2018-01-23 NOTE — Progress Notes (Signed)
FOLLOW UP  Assessment and Plan:   Hypertension Well controlled with current medications  Monitor blood pressure at home; patient to call if consistently greater than 130/80 Continue DASH diet.   Reminder to go to the ER if any CP, SOB, nausea, dizziness, severe HA, changes vision/speech, left arm numbness and tingling and jaw pain.  Cholesterol Currently at goal; continue current medications Continue low cholesterol diet and exercise.  Check lipid panel.   Prediabetes A1Cs borderline Continue diet and exercise.  Perform daily foot/skin check, notify office of any concerning changes.  Check A1C  Overweight Long discussion about weight loss, diet, and exercise Recommended diet heavy in fruits and veggies and low in animal meats, cheeses, and dairy products, appropriate calorie intake Discussed ideal weight for height Will follow up in 3 months  Vitamin D Def Below goal at last visit; continue supplementation to maintain goal of 70-100 Check Vit D level  Hypogonadism - continue to monitor, states medication is helping with symptoms of low T.    Continue diet and meds as discussed. Further disposition pending results of labs. Discussed med's effects and SE's.   Over 30 minutes of exam, counseling, chart review, and critical decision making was performed.   Future Appointments  Date Time Provider Maurice  06/21/2018 11:00 AM Unk Pinto, MD [facility] None    ----------------------------------------------------------------------------------------------------------------------  HPI 67 y.o. male  presents for 3 month follow up on hypertension, cholesterol, diabetes, weight and vitamin D deficiency. He is managed by Dr. Earnie Larsson for chronic lumbar pain, s/p cervical fusion in 2011 and 2012, and lumbar fusion x 3 in 2016 x 2 and in 2018 - S1 - L3, and is prescribed pain medication through that clinic.   BMI is Body mass index is 26.78 kg/m., he has been  working on diet and exercise. Wt Readings from Last 3 Encounters:  01/24/18 205 lb 12.8 oz (93.4 kg)  09/25/17 208 lb (94.3 kg)  06/06/17 200 lb 12.8 oz (91.1 kg)   His blood pressure has been controlled at home, today their BP is BP: 118/76  He does workout. He denies chest pain, shortness of breath, dizziness.   He is on cholesterol medication (atorvastatin 80 mg daily) and denies myalgias. His cholesterol is at goal. The cholesterol last visit was:   Lab Results  Component Value Date   CHOL 128 09/25/2017   HDL 41 09/25/2017   LDLCALC 66 09/25/2017   TRIG 120 09/25/2017   CHOLHDL 3.1 09/25/2017    He has been working on diet and exercise for prediabetes, and denies foot ulcerations, increased appetite, nausea, paresthesia of the feet, polydipsia, polyuria, visual disturbances, vomiting and weight loss. Last A1C in the office was:  Lab Results  Component Value Date   HGBA1C 5.5 09/25/2017   Patient is on Vitamin D supplement.   Lab Results  Component Value Date   VD25OH 20 02/13/2017     He has a history of testosterone deficiency and is on testosterone replacement. He states that the testosterone helps with his energy, libido, muscle mass. Lab Results  Component Value Date   TESTOSTERONE 920 (H) 02/13/2017     Current Medications:  Current Outpatient Medications on File Prior to Visit  Medication Sig  . atorvastatin (LIPITOR) 80 MG tablet TAKE 1 TABLET (80 MG TOTAL) BY MOUTH DAILY. FOR CHOLESTEROL  . Cholecalciferol (VITAMIN D) 2000 UNITS tablet Take 4,000 Units by mouth daily.   . diclofenac (VOLTAREN) 75 MG EC tablet TAKE 1 TABLET  BY MOUTH TWICE A DAY  . finasteride (PROSCAR) 5 MG tablet TAKE 1 TABLET DAILY FOR PROSTATE  . Flaxseed, Linseed, (FLAX SEED OIL) 1000 MG CAPS Take 1 capsule (1,000 mg total) by mouth 2 (two) times daily.  . fluticasone (FLONASE) 50 MCG/ACT nasal spray PLACE 2 SPRAYS INTO BOTH NOSTRILS DAILY. (Patient taking differently: Place 2 sprays into  both nostrils daily as needed for allergies. )  . loratadine (CLARITIN) 10 MG tablet Take 1 tablet (10 mg total) by mouth as needed for allergies.  . Magnesium 250 MG TABS Take 250 mg by mouth daily.  Marland Kitchen omeprazole (PRILOSEC) 20 MG capsule TAKE 1 CAPSULE BY MOUTH EVERY DAY  . ranitidine (ZANTAC) 300 MG tablet TAKE 1 TABLET BY MOUTH TWICE A DAY  . tamsulosin (FLOMAX) 0.4 MG CAPS capsule TAKE 2 CAPSULES DAILY FOR PROSTATE  . testosterone cypionate (DEPOTESTOSTERONE CYPIONATE) 200 MG/ML injection INJECT 2MLS INTRAMUSCULARLY EVERY 2 WEEKS AS DIRECTED  . vitamin B-12 (CYANOCOBALAMIN) 1000 MCG tablet Take 1,000 mcg by mouth daily.  . sildenafil (VIAGRA) 100 MG tablet Take 1 tablet (100 mg total) by mouth as needed for erectile dysfunction.   No current facility-administered medications on file prior to visit.      Allergies:  Allergies  Allergen Reactions  . Other Hives    Beef-hives  RED MEAT  . Aspirin Hives and Rash  . Salicylates Rash     Medical History:  Past Medical History:  Diagnosis Date  . Anxiety    takes Valium daily as needed  . Arthritis    in neck  . BPH (benign prostatic hyperplasia)    takes Flomax daily  . Chronic back pain    DDD  . Diverticulosis   . Foot drop    both feet  . GERD (gastroesophageal reflux disease)    takes Omeprazole daily as well as Zantac  . Hyperlipidemia    takes Atorvastatin daily  . Hypogonadism male   . Nocturia   . Numbness    only in 4th and 5th finger on left hand and left leg  . Seasonal allergies    takes Claritin daily as needed and uses Flonase daily as needed  . Urinary frequency    takes Flomax daily   Family history- Reviewed and unchanged Social history- Reviewed and unchanged   Review of Systems:  Review of Systems  Constitutional: Negative for malaise/fatigue and weight loss.  HENT: Negative for hearing loss and tinnitus.   Eyes: Negative for blurred vision and double vision.  Respiratory: Negative for  cough, shortness of breath and wheezing.   Cardiovascular: Negative for chest pain, palpitations, orthopnea, claudication and leg swelling.  Gastrointestinal: Negative for abdominal pain, blood in stool, constipation, diarrhea, heartburn, melena, nausea and vomiting.  Genitourinary: Negative.   Musculoskeletal: Positive for back pain. Negative for falls, joint pain and myalgias.  Skin: Negative for rash.  Neurological: Negative for dizziness, tingling, sensory change, weakness and headaches.  Endo/Heme/Allergies: Negative for polydipsia.  Psychiatric/Behavioral: Negative.   All other systems reviewed and are negative.   Physical Exam: BP 118/76   Pulse 76   Temp 97.7 F (36.5 C)   Resp 16   Ht 6' 1.5" (1.867 m)   Wt 205 lb 12.8 oz (93.4 kg)   SpO2 96%   BMI 26.78 kg/m  Wt Readings from Last 3 Encounters:  01/24/18 205 lb 12.8 oz (93.4 kg)  09/25/17 208 lb (94.3 kg)  06/06/17 200 lb 12.8 oz (91.1 kg)   General  Appearance: Well nourished, in no apparent distress. Eyes: PERRLA, EOMs, conjunctiva no swelling or erythema Sinuses: No Frontal/maxillary tenderness ENT/Mouth: Ext aud canals clear, TMs without erythema, bulging. No erythema, swelling, or exudate on post pharynx.  Tonsils not swollen or erythematous. Hearing normal.  Neck: Supple, thyroid normal.  Respiratory: Respiratory effort normal, BS equal bilaterally without rales, rhonchi, wheezing or stridor.  Cardio: RRR with no MRGs. Brisk peripheral pulses without edema.  Abdomen: Soft, + BS.  Non tender, no guarding, rebound, hernias, masses. Lymphatics: Non tender without lymphadenopathy.  Musculoskeletal: Full ROM, 5/5 strength, Normal gait Skin: Warm, dry without rashes, lesions, ecchymosis.  Neuro: Cranial nerves intact. No cerebellar symptoms.  Psych: Awake and oriented X 3, normal affect, Insight and Judgment appropriate.    Izora Ribas, NP 11:13 AM [facility]

## 2018-01-24 ENCOUNTER — Encounter: Payer: Self-pay | Admitting: Adult Health

## 2018-01-24 ENCOUNTER — Ambulatory Visit (INDEPENDENT_AMBULATORY_CARE_PROVIDER_SITE_OTHER): Payer: Medicare Other | Admitting: Adult Health

## 2018-01-24 VITALS — BP 118/76 | HR 76 | Temp 97.7°F | Resp 16 | Ht 73.5 in | Wt 205.8 lb

## 2018-01-24 DIAGNOSIS — M48061 Spinal stenosis, lumbar region without neurogenic claudication: Secondary | ICD-10-CM | POA: Diagnosis not present

## 2018-01-24 DIAGNOSIS — I10 Essential (primary) hypertension: Secondary | ICD-10-CM | POA: Diagnosis not present

## 2018-01-24 DIAGNOSIS — R7303 Prediabetes: Secondary | ICD-10-CM | POA: Diagnosis not present

## 2018-01-24 DIAGNOSIS — E349 Endocrine disorder, unspecified: Secondary | ICD-10-CM | POA: Diagnosis not present

## 2018-01-24 DIAGNOSIS — E782 Mixed hyperlipidemia: Secondary | ICD-10-CM | POA: Diagnosis not present

## 2018-01-24 DIAGNOSIS — E663 Overweight: Secondary | ICD-10-CM | POA: Diagnosis not present

## 2018-01-24 DIAGNOSIS — M5416 Radiculopathy, lumbar region: Secondary | ICD-10-CM | POA: Diagnosis not present

## 2018-01-24 DIAGNOSIS — M5136 Other intervertebral disc degeneration, lumbar region: Secondary | ICD-10-CM | POA: Diagnosis not present

## 2018-01-24 DIAGNOSIS — Z79899 Other long term (current) drug therapy: Secondary | ICD-10-CM | POA: Diagnosis not present

## 2018-01-24 DIAGNOSIS — E559 Vitamin D deficiency, unspecified: Secondary | ICD-10-CM

## 2018-01-24 NOTE — Patient Instructions (Signed)
Aim for 7+ servings of fruits and vegetables daily  80+ fluid ounces of water or unsweet tea for healthy kidneys  Limit alcohol intake  Limit animal fats in diet for cholesterol and heart health - choose grass fed whenever available  Aim for low stress - take time to unwind and care for your mental health  Aim for 150 min of moderate intensity exercise weekly for heart health, and weights twice weekly for bone health  Aim for 7-9 hours of sleep daily      When it comes to diets, agreement about the perfect plan isn't easy to find, even among the experts. Experts at the [employer] developed an idea known as the Healthy Eating Plate. Just imagine a plate divided into logical, healthy portions.  The emphasis is on diet quality:  Load up on vegetables and fruits - one-half of your plate: Aim for color and variety, and remember that potatoes don't count.  Go for whole grains - one-quarter of your plate: Whole wheat, barley, wheat berries, quinoa, oats, brown rice, and foods made with them. If you want pasta, go with whole wheat pasta.  Protein power - one-quarter of your plate: Fish, chicken, beans, and nuts are all healthy, versatile protein sources. Limit red meat.  The diet, however, does go beyond the plate, offering a few other suggestions.  Use healthy plant oils, such as olive, canola, soy, corn, sunflower and peanut. Check the labels, and avoid partially hydrogenated oil, which have unhealthy trans fats.  If you're thirsty, drink water. Coffee and tea are good in moderation, but skip sugary drinks and limit milk and dairy products to one or two daily servings.  The type of carbohydrate in the diet is more important than the amount. Some sources of carbohydrates, such as vegetables, fruits, whole grains, and beans-are healthier than others.  Finally, stay active.  

## 2018-01-25 DIAGNOSIS — Z6826 Body mass index (BMI) 26.0-26.9, adult: Secondary | ICD-10-CM | POA: Diagnosis not present

## 2018-01-25 DIAGNOSIS — M5416 Radiculopathy, lumbar region: Secondary | ICD-10-CM | POA: Diagnosis not present

## 2018-01-25 LAB — VITAMIN D 25 HYDROXY (VIT D DEFICIENCY, FRACTURES): Vit D, 25-Hydroxy: 79 ng/mL (ref 30–100)

## 2018-01-25 LAB — HEMOGLOBIN A1C
Hgb A1c MFr Bld: 5.6 %{Hb}
Mean Plasma Glucose: 114 (calc)
eAG (mmol/L): 6.3 (calc)

## 2018-01-25 LAB — COMPLETE METABOLIC PANEL WITH GFR
AG RATIO: 1.3 (calc) (ref 1.0–2.5)
ALBUMIN MSPROF: 4 g/dL (ref 3.6–5.1)
ALT: 23 U/L (ref 9–46)
AST: 19 U/L (ref 10–35)
Alkaline phosphatase (APISO): 88 U/L (ref 40–115)
BUN: 10 mg/dL (ref 7–25)
CO2: 29 mmol/L (ref 20–32)
Calcium: 9.5 mg/dL (ref 8.6–10.3)
Chloride: 106 mmol/L (ref 98–110)
Creat: 0.93 mg/dL (ref 0.70–1.25)
GFR, EST AFRICAN AMERICAN: 98 mL/min/{1.73_m2} (ref >=60)
GFR, EST NON AFRICAN AMERICAN: 85 mL/min/{1.73_m2} (ref >=60)
GLOBULIN: 3 g/dL (ref 1.9–3.7)
Glucose, Bld: 82 mg/dL (ref 65–99)
POTASSIUM: 4.2 mmol/L (ref 3.5–5.3)
SODIUM: 140 mmol/L (ref 135–146)
TOTAL PROTEIN: 7 g/dL (ref 6.1–8.1)
Total Bilirubin: 0.5 mg/dL (ref 0.2–1.2)

## 2018-01-25 LAB — CBC WITH DIFFERENTIAL/PLATELET
Basophils Absolute: 30 {cells}/uL (ref 0–200)
Basophils Relative: 0.8 %
Eosinophils Absolute: 130 {cells}/uL (ref 15–500)
Eosinophils Relative: 3.5 %
HCT: 42.1 % (ref 38.5–50.0)
Hemoglobin: 15 g/dL (ref 13.2–17.1)
Lymphs Abs: 1180 {cells}/uL (ref 850–3900)
MCH: 33.3 pg — ABNORMAL HIGH (ref 27.0–33.0)
MCHC: 35.6 g/dL (ref 32.0–36.0)
MCV: 93.3 fL (ref 80.0–100.0)
MPV: 10.6 fL (ref 7.5–12.5)
Monocytes Relative: 10.8 %
Neutro Abs: 1961 {cells}/uL (ref 1500–7800)
Neutrophils Relative %: 53 %
Platelets: 216 10*3/uL (ref 140–400)
RBC: 4.51 Million/uL (ref 4.20–5.80)
RDW: 13 % (ref 11.0–15.0)
Total Lymphocyte: 31.9 %
WBC mixed population: 400 {cells}/uL (ref 200–950)
WBC: 3.7 10*3/uL — ABNORMAL LOW (ref 3.8–10.8)

## 2018-01-25 LAB — TSH: TSH: 3.6 m[IU]/L (ref 0.40–4.50)

## 2018-01-25 LAB — LIPID PANEL
Cholesterol: 125 mg/dL (ref <200)
HDL: 42 mg/dL (ref >40)
LDL Cholesterol (Calc): 66 mg/dL (calc)
NON-HDL CHOLESTEROL (CALC): 83 mg/dL (ref <130)
Total CHOL/HDL Ratio: 3 (calc) (ref <5.0)
Triglycerides: 91 mg/dL (ref <150)

## 2018-02-08 ENCOUNTER — Other Ambulatory Visit: Payer: Self-pay | Admitting: Internal Medicine

## 2018-02-23 ENCOUNTER — Other Ambulatory Visit: Payer: Self-pay | Admitting: Internal Medicine

## 2018-02-23 DIAGNOSIS — N320 Bladder-neck obstruction: Secondary | ICD-10-CM

## 2018-02-27 DIAGNOSIS — M5416 Radiculopathy, lumbar region: Secondary | ICD-10-CM | POA: Diagnosis not present

## 2018-02-27 DIAGNOSIS — M961 Postlaminectomy syndrome, not elsewhere classified: Secondary | ICD-10-CM | POA: Diagnosis not present

## 2018-03-20 ENCOUNTER — Other Ambulatory Visit: Payer: Self-pay | Admitting: Internal Medicine

## 2018-03-28 DIAGNOSIS — R030 Elevated blood-pressure reading, without diagnosis of hypertension: Secondary | ICD-10-CM | POA: Diagnosis not present

## 2018-03-28 DIAGNOSIS — M5416 Radiculopathy, lumbar region: Secondary | ICD-10-CM | POA: Diagnosis not present

## 2018-03-30 ENCOUNTER — Other Ambulatory Visit: Payer: Self-pay | Admitting: Internal Medicine

## 2018-05-04 ENCOUNTER — Other Ambulatory Visit: Payer: Self-pay | Admitting: Adult Health

## 2018-05-29 DIAGNOSIS — M5416 Radiculopathy, lumbar region: Secondary | ICD-10-CM | POA: Diagnosis not present

## 2018-05-29 DIAGNOSIS — M961 Postlaminectomy syndrome, not elsewhere classified: Secondary | ICD-10-CM | POA: Diagnosis not present

## 2018-05-29 DIAGNOSIS — Z6826 Body mass index (BMI) 26.0-26.9, adult: Secondary | ICD-10-CM | POA: Diagnosis not present

## 2018-06-14 ENCOUNTER — Other Ambulatory Visit: Payer: Self-pay | Admitting: Adult Health

## 2018-06-21 ENCOUNTER — Encounter: Payer: Self-pay | Admitting: Internal Medicine

## 2018-06-21 ENCOUNTER — Ambulatory Visit (INDEPENDENT_AMBULATORY_CARE_PROVIDER_SITE_OTHER): Payer: Medicare Other | Admitting: Internal Medicine

## 2018-06-21 VITALS — BP 132/86 | HR 56 | Temp 97.5°F | Resp 16 | Ht 73.0 in | Wt 202.8 lb

## 2018-06-21 DIAGNOSIS — Z87891 Personal history of nicotine dependence: Secondary | ICD-10-CM

## 2018-06-21 DIAGNOSIS — Z79899 Other long term (current) drug therapy: Secondary | ICD-10-CM | POA: Diagnosis not present

## 2018-06-21 DIAGNOSIS — K219 Gastro-esophageal reflux disease without esophagitis: Secondary | ICD-10-CM | POA: Diagnosis not present

## 2018-06-21 DIAGNOSIS — R7303 Prediabetes: Secondary | ICD-10-CM

## 2018-06-21 DIAGNOSIS — Z136 Encounter for screening for cardiovascular disorders: Secondary | ICD-10-CM | POA: Diagnosis not present

## 2018-06-21 DIAGNOSIS — I10 Essential (primary) hypertension: Secondary | ICD-10-CM

## 2018-06-21 DIAGNOSIS — N138 Other obstructive and reflux uropathy: Secondary | ICD-10-CM

## 2018-06-21 DIAGNOSIS — E782 Mixed hyperlipidemia: Secondary | ICD-10-CM | POA: Diagnosis not present

## 2018-06-21 DIAGNOSIS — N401 Enlarged prostate with lower urinary tract symptoms: Secondary | ICD-10-CM

## 2018-06-21 DIAGNOSIS — R7309 Other abnormal glucose: Secondary | ICD-10-CM

## 2018-06-21 DIAGNOSIS — E559 Vitamin D deficiency, unspecified: Secondary | ICD-10-CM

## 2018-06-21 DIAGNOSIS — Z8249 Family history of ischemic heart disease and other diseases of the circulatory system: Secondary | ICD-10-CM

## 2018-06-21 DIAGNOSIS — G894 Chronic pain syndrome: Secondary | ICD-10-CM

## 2018-06-21 DIAGNOSIS — Z125 Encounter for screening for malignant neoplasm of prostate: Secondary | ICD-10-CM

## 2018-06-21 DIAGNOSIS — E349 Endocrine disorder, unspecified: Secondary | ICD-10-CM

## 2018-06-21 DIAGNOSIS — Z1212 Encounter for screening for malignant neoplasm of rectum: Secondary | ICD-10-CM

## 2018-06-21 DIAGNOSIS — Z1211 Encounter for screening for malignant neoplasm of colon: Secondary | ICD-10-CM

## 2018-06-21 MED ORDER — FAMOTIDINE 40 MG PO TABS: ORAL_TABLET | ORAL | 3 refills | Status: DC

## 2018-06-21 MED ORDER — BISOPROLOL FUMARATE 10 MG PO TABS: ORAL_TABLET | ORAL | 1 refills | Status: DC

## 2018-06-21 NOTE — Progress Notes (Signed)
ADULT & ADOLESCENT INTERNAL MEDICINE   Jacob Larsen, M.D.     Uvaldo Bristle. Silverio Lay, P.A.-C Liane Comber, Tim[place]                [address] [place], [place] [address] Telephone [phone] Telefax [phone] Comprehensive Evaluation & Examination     This very nice 67 y.o. MBMpresents for a  comprehensive evaluation and management of multiple medical co-morbidities.  Patient has been followed for labile HTN, HLD, BOO/LUTS,  Prediabetes and Vitamin D Deficiency.  Patient has GERD controlled w/diet and meds.     Patient has had #2 surgeries on his neck and # 4 surgeries on his back for DDD/HNP. Last surgery was L5-S1 decompression and fusion  in Apr 2018  by Dr Annette Stable     HTN predates since     . Patient's BP has been controlled at home.  Today's BP was initially sl elevated and rechecked at goal - 132/86. Patient denies any cardiac symptoms as chest pain, palpitations, shortness of breath, dizziness or ankle swelling.     Patient's hyperlipidemia is controlled with diet and medications. Patient denies myalgias or other medication SE's. Last lipids were at goal: Lab Results  Component Value Date   CHOL 148 06/21/2018   HDL 40 (L) 06/21/2018   LDLCALC 84 06/21/2018   TRIG 137 06/21/2018   CHOLHDL 3.7 06/21/2018      Patient has hx/o prediabetes (A1c 6.0%/2014)  and patient denies reactive hypoglycemic symptoms, visual blurring, diabetic polys or paresthesias. Last A1c was Normal & at goal: Lab Results  Component Value Date   HGBA1C 5.6 06/21/2018       Patient is on Testosterone replacement with improved sense of well being and stamina.      Finally, patient has history of Vitamin D Deficiency and last vitamin D was at goal: Lab Results  Component Value Date   VD25OH 64 06/21/2018   Current Outpatient Medications on File Prior to Visit  Medication Sig  . atorvastatin (LIPITOR) 80 MG tablet TAKE 1 TABLET (80 MG  TOTAL) BY MOUTH DAILY. FOR CHOLESTEROL  . Cholecalciferol (VITAMIN D) 2000 UNITS tablet Take 4,000 Units by mouth daily.   . diclofenac (VOLTAREN) 75 MG EC tablet TAKE 1 TABLET BY MOUTH TWICE A DAY  . finasteride (PROSCAR) 5 MG tablet TAKE 1 TABLET DAILY FOR PROSTATE  . Flaxseed, Linseed, (FLAX SEED OIL) 1000 MG CAPS Take 1 capsule (1,000 mg total) by mouth 2 (two) times daily.  . fluticasone (FLONASE) 50 MCG/ACT nasal spray PLACE 2 SPRAYS INTO BOTH NOSTRILS DAILY. (Patient taking differently: Place 2 sprays into both nostrils daily as needed for allergies. )  . loratadine (CLARITIN) 10 MG tablet Take 1 tablet (10 mg total) by mouth as needed for allergies.  . Magnesium 250 MG TABS Take 250 mg by mouth daily.  . tamsulosin (FLOMAX) 0.4 MG CAPS capsule TAKE 2 CAPSULES DAILY FOR PROSTATE  . testosterone cypionate (DEPOTESTOSTERONE CYPIONATE) 200 MG/ML injection INJECT 2MLS INTRAMUSCULARLY EVERY 2 WEEKS AS DIRECTED  . vitamin B-12 (CYANOCOBALAMIN) 1000 MCG tablet Take 1,000 mcg by mouth daily.  . sildenafil (VIAGRA) 100 MG tablet Take 1 tablet (100 mg total) by mouth as needed for erectile dysfunction.   No current facility-administered medications on file prior to visit.    Allergies  Allergen Reactions  . Other Hives    Beef-hives  RED MEAT  . Aspirin Hives and Rash  . Salicylates Rash   Past Medical History:  Diagnosis Date  . Anxiety    takes Valium daily as needed  . Arthritis    in neck  . BPH (benign prostatic hyperplasia)    takes Flomax daily  . Chronic back pain    DDD  . Diverticulosis   . Foot drop    both feet  . GERD (gastroesophageal reflux disease)    takes Omeprazole daily as well as Zantac  . Hyperlipidemia    takes Atorvastatin daily  . Hypogonadism male   . Nocturia   . Numbness    only in 4th and 5th finger on left hand and left leg  . Seasonal allergies    takes Claritin daily as needed and uses Flonase daily as needed  . Urinary frequency    takes  Flomax daily   Health Maintenance  Topic Date Due  . INFLUENZA VACCINE  04/12/2018  . COLONOSCOPY  05/07/2018  . TETANUS/TDAP  05/27/2024  . Hepatitis C Screening  Completed  . PNA vac Low Risk Adult  Completed   Immunization History  Administered Date(s) Administered  . Influenza Split 06/18/2015  . Influenza, High Dose Seasonal PF 06/20/2016, 06/06/2017  . Influenza-Unspecified 08/17/2014  . PPD Test 05/27/2014  . Pneumococcal Conjugate-13 12/09/2015  . Pneumococcal Polysaccharide-23 04/08/2013, 06/06/2017  . Td 09/13/2003  . Tdap 05/27/2014  . Zoster Recombinat (Shingrix) 11/29/2017   Last Colon -  Past Surgical History:  Procedure Laterality Date  . BACK SURGERY  2016 x 2 L3-L5, 12/2016 S1   lumbar fusion x 3  . CERVICAL FUSION  2011/2012  . COLONOSCOPY    . KNEE ARTHROSCOPY     right 1985; left 1992  . SEPTOPLASTY    . SHOULDER ARTHROSCOPY Left 2015  . SHOULDER ARTHROSCOPY WITH ROTATOR CUFF REPAIR Right 08/18/2017   Dr. Onnie Graham  . SHOULDER SURGERY Right 08   Family History  Problem Relation Age of Onset  . Leukemia Mother   . Diabetes Mother   . Hypertension Father   . Diabetes Father   . Colon cancer Neg Hx   . Stomach cancer Neg Hx    Social History   Socioeconomic History  . Marital status: Married    Spouse name: Pricilla Holm  . Number of children: 2  Occupational History  . Retired Engineer, maintenance (IT)  Tobacco Use  . Smoking status: Former Smoker    Last attempt to quit: 05/28/1995    Years since quitting: 23.0  . Smokeless tobacco: Never Used  . Tobacco comment: quit smoking >20+yrs ago  Substance and Sexual Activity  . Alcohol use: Yes    Alcohol/week: 0.0 standard drinks    Comment: rarely  . Drug use: No  . Sexual activity: Yes    ROS Constitutional: Denies fever, chills, weight loss/gain, headaches, insomnia,  night sweats or change in appetite. Does c/o fatigue. Eyes: Denies redness, blurred vision, diplopia, discharge, itchy or watery eyes.   ENT: Denies discharge, congestion, post nasal drip, epistaxis, sore throat, earache, hearing loss, dental pain, Tinnitus, Vertigo, Sinus pain or snoring.  Cardio: Denies chest pain, palpitations, irregular heartbeat, syncope, dyspnea, diaphoresis, orthopnea, PND, claudication or edema Respiratory: denies cough, dyspnea, DOE, pleurisy, hoarseness, laryngitis or wheezing.  Gastrointestinal: Denies dysphagia, heartburn, reflux, water brash, pain, cramps, nausea, vomiting, bloating, diarrhea, constipation, hematemesis, melena, hematochezia, jaundice or hemorrhoids Genitourinary: Denies dysuria, frequency, discharge, hematuria or flank pain. Has urgency, nocturia x 2-3 &  occasional hesitancy. Musculoskeletal: Denies arthralgia, myalgia, stiffness, Jt. Swelling, pain, limp or strain/sprain. Denies Falls. Skin: Denies puritis, rash, hives, warts, acne, eczema or change in skin lesion Neuro: No weakness, tremor, incoordination, spasms, paresthesia or pain Psychiatric: Denies confusion, memory loss or sensory loss. Denies Depression. Endocrine: Denies change in weight, skin, hair change, nocturia, and paresthesia, diabetic polys, visual blurring or hyper / hypo glycemic episodes.  Heme/Lymph: No excessive bleeding, bruising or enlarged lymph nodes.  Physical Exam  BP 132/86   Pulse (!) 56   Temp (!) 97.5 F (36.4 C)   Resp 16   Ht 6\' 1"  (1.854 m)   Wt 202 lb 12.8 oz (92 kg)   BMI 26.76 kg/m   General Appearance: Well nourished and well groomed and in no apparent distress.  Eyes: PERRLA, EOMs, conjunctiva no swelling or erythema, normal fundi and vessels. Sinuses: No frontal/maxillary tenderness ENT/Mouth: EACs patent / TMs  nl. Nares clear without erythema, swelling, mucoid exudates. Oral hygiene is good. No erythema, swelling, or exudate. Tongue normal, non-obstructing. Tonsils not swollen or erythematous. Hearing normal.  Neck: Supple, thyroid not palpable. No bruits, nodes or  JVD. Respiratory: Respiratory effort normal.  BS equal and clear bilateral without rales, rhonci, wheezing or stridor. Cardio: Heart sounds are normal with regular rate and rhythm and no murmurs, rubs or gallops. Peripheral pulses are normal and equal bilaterally without edema. No aortic or femoral bruits. Chest: symmetric with normal excursions and percussion.  Abdomen: Soft, with Nl bowel sounds. Nontender, no guarding, rebound, hernias, masses, or organomegaly.  Lymphatics: Non tender without lymphadenopathy.  Genitourinary: No hernias.Testes nl. DRE - prostate nl for age - smooth & firm w/o nodules. Musculoskeletal: Full ROM all peripheral extremities, joint stability, 5/5 strength, and normal gait. Skin: Warm and dry without rashes, lesions, cyanosis, clubbing or  ecchymosis.  Neuro: Cranial nerves intact, reflexes equal bilaterally. Normal muscle tone, no cerebellar symptoms. Sensation intact.  Pysch: Alert and oriented X 3 with normal affect, insight and judgment appropriate.   Assessment and Plan  1. Essential hypertension  - EKG 12-Lead - [place], RETROPERITNL ABD,  LTD - Urinalysis, Routine w reflex microscopic - Microalbumin / creatinine urine ratio - CBC with Differential/Platelet - COMPLETE METABOLIC PANEL WITH GFR - Magnesium - TSH - bisoprolol (ZEBETA) 10 MG tablet; Take 1 tablet daily for BP  Dispense: 90 tablet; Refill: 1  2. Hyperlipidemia, mixed  - EKG 12-Lead - [place], RETROPERITNL ABD,  LTD - Lipid panel - TSH  3. Abnormal glucose  - EKG 12-Lead - [place], RETROPERITNL ABD,  LTD - Hemoglobin A1c - Insulin, random  4. Vitamin D deficiency  - VITAMIN D 25 Hydroxyl  5. Prediabetes  - Hemoglobin A1c - Insulin, random  6. Testosterone deficiency  - Testosterone  7. Gastroesophageal reflux disease  - CBC with Differential/Platelet  8. BPH with obstruction/lower urinary tract symptoms  - PSA  9. Chronic pain syndrome   10. Prostate cancer screening  -  PSA  11. Screening for colorectal cancer  - POC Hemoccult Bld/Stl.  12. Screening for ischemic heart disease  - EKG 12-Lead  13. FH: hypertension  - EKG 12-Lead - [place], RETROPERITNL ABD,  LTD  14. Former smoker  - EKG 12-Lead - [place], RETROPERITNL ABD,  LTD  15. Screening for AAA (aortic abdominal aneurysm)  - [place], RETROPERITNL ABD,  LTD  16. Medication management  - Urinalysis, Routine w reflex microscopic - Microalbumin / creatinine urine ratio - Testosterone - CBC with Differential/Platelet - COMPLETE  METABOLIC PANEL WITH GFR - Magnesium - Lipid panel - TSH - Hemoglobin A1c - Insulin, random - VITAMIN D 25 Hydroxy         Patient was counseled in prudent diet, weight control to achieve/maintain BMI less than 25, BP monitoring, regular exercise and medications as discussed.  Discussed med effects and SE's. Routine screening labs and tests as requested with regular follow-up as recommended. Over 40 minutes of exam, counseling, chart review and high complex critical decision making was performed

## 2018-06-21 NOTE — Patient Instructions (Signed)

## 2018-06-22 ENCOUNTER — Other Ambulatory Visit: Payer: Self-pay | Admitting: *Deleted

## 2018-06-22 DIAGNOSIS — K219 Gastro-esophageal reflux disease without esophagitis: Secondary | ICD-10-CM

## 2018-06-22 LAB — CBC WITH DIFFERENTIAL/PLATELET
Basophils Absolute: 19 cells/uL (ref 0–200)
Basophils Relative: 0.6 %
EOS PCT: 3.8 %
Eosinophils Absolute: 118 cells/uL (ref 15–500)
HCT: 45 % (ref 38.5–50.0)
Hemoglobin: 15.5 g/dL (ref 13.2–17.1)
Lymphs Abs: 1054 cells/uL (ref 850–3900)
MCH: 32.7 pg (ref 27.0–33.0)
MCHC: 34.4 g/dL (ref 32.0–36.0)
MCV: 94.9 fL (ref 80.0–100.0)
MPV: 10.7 fL (ref 7.5–12.5)
Monocytes Relative: 10.6 %
NEUTROS PCT: 51 %
Neutro Abs: 1581 cells/uL (ref 1500–7800)
PLATELETS: 247 10*3/uL (ref 140–400)
RBC: 4.74 10*6/uL (ref 4.20–5.80)
RDW: 12.7 % (ref 11.0–15.0)
TOTAL LYMPHOCYTE: 34 %
WBC mixed population: 329 cells/uL (ref 200–950)
WBC: 3.1 10*3/uL — AB (ref 3.8–10.8)

## 2018-06-22 LAB — LIPID PANEL
Cholesterol: 148 mg/dL (ref <200)
HDL: 40 mg/dL — ABNORMAL LOW (ref >40)
LDL CHOLESTEROL (CALC): 84 mg/dL
NON-HDL CHOLESTEROL (CALC): 108 mg/dL (ref <130)
TRIGLYCERIDES: 137 mg/dL (ref <150)
Total CHOL/HDL Ratio: 3.7 (calc) (ref <5.0)

## 2018-06-22 LAB — COMPLETE METABOLIC PANEL WITH GFR
AG Ratio: 1.3 (calc) (ref 1.0–2.5)
ALBUMIN MSPROF: 4.1 g/dL (ref 3.6–5.1)
ALT: 25 U/L (ref 9–46)
AST: 21 U/L (ref 10–35)
Alkaline phosphatase (APISO): 85 U/L (ref 40–115)
BUN: 11 mg/dL (ref 7–25)
CALCIUM: 9.9 mg/dL (ref 8.6–10.3)
CO2: 30 mmol/L (ref 20–32)
CREATININE: 1.11 mg/dL (ref 0.70–1.25)
Chloride: 103 mmol/L (ref 98–110)
GFR, EST AFRICAN AMERICAN: 79 mL/min/{1.73_m2} (ref >=60)
GFR, Est Non African American: 68 mL/min/{1.73_m2} (ref >=60)
GLUCOSE: 82 mg/dL (ref 65–99)
Globulin: 3.2 g/dL (calc) (ref 1.9–3.7)
Potassium: 4.4 mmol/L (ref 3.5–5.3)
Sodium: 138 mmol/L (ref 135–146)
TOTAL PROTEIN: 7.3 g/dL (ref 6.1–8.1)
Total Bilirubin: 0.5 mg/dL (ref 0.2–1.2)

## 2018-06-22 LAB — VITAMIN D 25 HYDROXY (VIT D DEFICIENCY, FRACTURES): Vit D, 25-Hydroxy: 64 ng/mL (ref 30–100)

## 2018-06-22 LAB — URINALYSIS, ROUTINE W REFLEX MICROSCOPIC
BILIRUBIN URINE: NEGATIVE
GLUCOSE, UA: NEGATIVE
HGB URINE DIPSTICK: NEGATIVE
Ketones, ur: NEGATIVE
Leukocytes, UA: NEGATIVE
Nitrite: NEGATIVE
PROTEIN: NEGATIVE
Specific Gravity, Urine: 1.014 (ref 1.001–1.03)
pH: >=8.5 — AB (ref 5.0–8.0)

## 2018-06-22 LAB — MAGNESIUM: Magnesium: 2 mg/dL (ref 1.5–2.5)

## 2018-06-22 LAB — MICROALBUMIN / CREATININE URINE RATIO
CREATININE, URINE: 83 mg/dL (ref 20–320)
Microalb Creat Ratio: 4 mcg/mg creat (ref <30)
Microalb, Ur: 0.3 mg/dL

## 2018-06-22 LAB — PSA: PSA: 1.5 ng/mL (ref <=4.0)

## 2018-06-22 LAB — HEMOGLOBIN A1C
Hgb A1c MFr Bld: 5.6 % of total Hgb (ref <5.7)
Mean Plasma Glucose: 114 (calc)
eAG (mmol/L): 6.3 (calc)

## 2018-06-22 LAB — INSULIN, RANDOM: INSULIN: 1.6 u[IU]/mL — AB (ref 2.0–19.6)

## 2018-06-22 LAB — TESTOSTERONE: Testosterone: 442 ng/dL (ref 250–827)

## 2018-06-22 LAB — TSH: TSH: 3.03 m[IU]/L (ref 0.40–4.50)

## 2018-06-22 MED ORDER — OMEPRAZOLE 40 MG PO CPDR: 40.0000 mg | DELAYED_RELEASE_CAPSULE | Freq: Every day | ORAL | 3 refills | Status: DC

## 2018-06-24 ENCOUNTER — Encounter: Payer: Self-pay | Admitting: Internal Medicine

## 2018-06-25 ENCOUNTER — Encounter: Payer: Self-pay | Admitting: *Deleted

## 2018-07-06 ENCOUNTER — Other Ambulatory Visit: Payer: Self-pay

## 2018-07-06 DIAGNOSIS — Z1211 Encounter for screening for malignant neoplasm of colon: Secondary | ICD-10-CM

## 2018-07-06 DIAGNOSIS — Z1212 Encounter for screening for malignant neoplasm of rectum: Principal | ICD-10-CM

## 2018-07-06 LAB — POC HEMOCCULT BLD/STL (HOME/3-CARD/SCREEN)
FECAL OCCULT BLD: NEGATIVE
FECAL OCCULT BLD: NEGATIVE
FECAL OCCULT BLD: NEGATIVE

## 2018-07-10 DIAGNOSIS — Z1211 Encounter for screening for malignant neoplasm of colon: Secondary | ICD-10-CM | POA: Diagnosis not present

## 2018-07-13 ENCOUNTER — Other Ambulatory Visit: Payer: Self-pay | Admitting: Internal Medicine

## 2018-07-13 DIAGNOSIS — N401 Enlarged prostate with lower urinary tract symptoms: Secondary | ICD-10-CM | POA: Diagnosis not present

## 2018-07-17 DIAGNOSIS — N401 Enlarged prostate with lower urinary tract symptoms: Secondary | ICD-10-CM | POA: Diagnosis not present

## 2018-07-17 DIAGNOSIS — R351 Nocturia: Secondary | ICD-10-CM | POA: Diagnosis not present

## 2018-07-31 ENCOUNTER — Ambulatory Visit (INDEPENDENT_AMBULATORY_CARE_PROVIDER_SITE_OTHER): Payer: Medicare Other | Admitting: *Deleted

## 2018-07-31 DIAGNOSIS — Z23 Encounter for immunization: Secondary | ICD-10-CM

## 2018-08-16 DIAGNOSIS — M5416 Radiculopathy, lumbar region: Secondary | ICD-10-CM | POA: Diagnosis not present

## 2018-08-16 DIAGNOSIS — M961 Postlaminectomy syndrome, not elsewhere classified: Secondary | ICD-10-CM | POA: Diagnosis not present

## 2018-08-20 NOTE — Progress Notes (Signed)
Patient ID: Jacob Larsen, male   DOB: 10-24-1950, 67 y.o.   MRN: [id number]  MEDICARE ANNUAL WELLNESS VISIT AND FOLLOW UP Assessment:   Encounter for annual medicare wellness visit  Essential hypertension - continue medications, DASH diet, exercise and monitor at home. Call if greater than 130/80.  -     CBC with Differential/Platelet -     CMP/GFR -     TSH  Chronic obstructive pulmonary disease, unspecified COPD type ([place]) Avoid triggers, monitor.   Reflux esophagitis Continue PPI/H2 blocker, diet discussed  Lumbar degenerative disc disease Continue follow up  Mixed hyperlipidemia -     Lipid panel -continue medications, check lipids, decrease fatty foods, increase activity.   Abnormal glucose Recent A1Cs at goal Discussed diet/exercise, weight management  Defer A1C; check CMP  Medication management -     Magnesium  Vitamin D deficiency Continue supplement  Testosterone deficiency - continue replacement therapy, check testosterone levels as needed.   S/P lumbar spinal fusion Continue follow up  Diverticulosis of large intestine without hemorrhage Increase fiber, no symptoms at this time - Due for colonoscopy, has scheduled 09/2018  Impotence of organic origin Continue follow up, current medications  Spondylolisthesis of lumbar region Continue follow up  Overweight Long discussion about weight loss, diet, and exercise Recommended diet heavy in fruits and veggies and low in animal meats, cheeses, and dairy products, appropriate calorie intake Discussed appropriate weight for height  and initial goal (<200) Follow up at next visit   Over 30 minutes of exam, counseling, chart review, and critical decision making was performed Future Appointments  Date Time Provider [place]  09/24/2018  1:00 PM [facility] PREVISIT RM 51 [facility]  10/08/2018  8:30 AM Danis, Kirke Corin, MD [facility]  12/27/2018 10:30 AM Unk Pinto, MD [facility]  None  07/10/2019  9:00 AM Unk Pinto, MD [facility] None    Plan:   During the course of the visit the patient was educated and counseled about appropriate screening and preventive services including:    Pneumococcal vaccine   Influenza vaccine  Prevnar 13  Td vaccine  Screening electrocardiogram  Colorectal cancer screening  Diabetes screening  Glaucoma screening  Nutrition counseling    Subjective:  Jacob Larsen is a 67 y.o. AA male who presents for Medicare Annual Wellness Visit and 3 month follow up for HTN, hyperlipidemia, prediabetes, and vitamin D Def.   He is managed by Dr. Earnie Larsson for chronic lumbar pain, s/p cervical fusion in 2011 and 2012, and lumbar fusion x 3 in 2016 x 2 and in 2018 - S1 - L3, and is prescribed pain medication through that clinic.   He has COPD by imaging, asymptomatic off of inhalers.   BMI is Body mass index is 27.18 kg/m., he has been working on diet and exercise. Wt Readings from Last 3 Encounters:  08/21/18 206 lb (93.4 kg)  06/21/18 202 lb 12.8 oz (92 kg)  01/24/18 205 lb 12.8 oz (93.4 kg)   His blood pressure has been controlled at home, today their BP is BP: 124/84 He does not workout. He denies chest pain, shortness of breath, dizziness.    He is on cholesterol medication (atorvastatin 80 mg daily) and denies myalgias. His cholesterol is at goal. The cholesterol last visit was:   Lab Results  Component Value Date   CHOL 148 06/21/2018   HDL 40 (L) 06/21/2018   LDLCALC 84 06/21/2018   TRIG 137 06/21/2018   CHOLHDL 3.7  06/21/2018   He has been working on diet and exercise for glucose management, and denies foot ulcerations, hyperglycemia, hypoglycemia , increased appetite, nausea, polydipsia, polyuria, visual disturbances, vomiting and weight loss. Last A1C in the office was:  Lab Results  Component Value Date   HGBA1C 5.6 06/21/2018   Last GFR Lab Results  Component Value Date   GFRAA 79 06/21/2018    Patient is on Vitamin D supplement.   Lab Results  Component Value Date   VD25OH 64 06/21/2018     He has a history of testosterone deficiency and is on testosterone replacement, last shot was 20 days ago, he injects 400 mg at home. He states that the testosterone helps with his energy, libido, muscle mass. Lab Results  Component Value Date   TESTOSTERONE 442 06/21/2018     Medication Review: Current Outpatient Medications on File Prior to Visit  Medication Sig Dispense Refill  . atorvastatin (LIPITOR) 80 MG tablet TAKE 1 TABLET (80 MG TOTAL) BY MOUTH DAILY. FOR CHOLESTEROL 90 tablet 0  . bisoprolol (ZEBETA) 10 MG tablet Take 1 tablet daily for BP 90 tablet 1  . Cholecalciferol (VITAMIN D) 2000 UNITS tablet Take 4,000 Units by mouth daily.     . diclofenac (VOLTAREN) 75 MG EC tablet TAKE 1 TABLET BY MOUTH TWICE A DAY 180 tablet 1  . finasteride (PROSCAR) 5 MG tablet TAKE 1 TABLET DAILY FOR PROSTATE 90 tablet 1  . Flaxseed, Linseed, (FLAX SEED OIL) 1000 MG CAPS Take 1 capsule (1,000 mg total) by mouth 2 (two) times daily.  0  . fluticasone (FLONASE) 50 MCG/ACT nasal spray PLACE 2 SPRAYS INTO BOTH NOSTRILS DAILY. (Patient taking differently: Place 2 sprays into both nostrils daily as needed for allergies. ) 16 g 3  . loratadine (CLARITIN) 10 MG tablet Take 1 tablet (10 mg total) by mouth as needed for allergies. 90 tablet 4  . Magnesium 250 MG TABS Take 250 mg by mouth daily.    Marland Kitchen omeprazole (PRILOSEC) 40 MG capsule Take 1 capsule (40 mg total) by mouth daily. (Patient taking differently: Take 40 mg by mouth 2 (two) times daily. ) 90 capsule 3  . sildenafil (VIAGRA) 100 MG tablet Take 1 tablet (100 mg total) by mouth as needed for erectile dysfunction. 30 tablet 1  . tamsulosin (FLOMAX) 0.4 MG CAPS capsule TAKE 2 CAPSULES DAILY FOR PROSTATE 180 capsule 1  . testosterone cypionate (DEPOTESTOSTERONE CYPIONATE) 200 MG/ML injection INJECT 2MLS INTRAMUSCULARLY EVERY 2 WEEKS AS DIRECTED 10 mL  2  . vitamin B-12 (CYANOCOBALAMIN) 1000 MCG tablet Take 1,000 mcg by mouth daily.     No current facility-administered medications on file prior to visit.     Current Problems (verified) Patient Active Problem List   Diagnosis Date Noted  . Overweight (BMI 25.0-29.9) 01/23/2018  . COPD (chronic obstructive pulmonary disease) ([place]) 04/03/2017  . S/P lumbar spinal fusion 12/27/2016  . Medication management 09/09/2015  . Spondylolisthesis of lumbar region 04/28/2015  . Testosterone deficiency 11/06/2014  . Lumbar degenerative disc disease 09/23/2014  . Essential hypertension 08/13/2013  . Mixed hyperlipidemia 08/13/2013  . Other abnormal glucose 08/13/2013  . Reflux esophagitis 08/13/2013  . Vitamin D deficiency 08/13/2013  . Impotence of organic origin 08/13/2013  . Diverticulosis of large intestine 04/01/2008    Screening Tests Immunization History  Administered Date(s) Administered  . Influenza Split 06/18/2015  . Influenza, High Dose Seasonal PF 06/20/2016, 06/06/2017, 07/31/2018  . Influenza-Unspecified 08/17/2014  . PPD Test 05/27/2014  . Pneumococcal  Conjugate-13 12/09/2015  . Pneumococcal Polysaccharide-23 04/08/2013, 06/06/2017  . Td 09/13/2003  . Tdap 05/27/2014  . Zoster Recombinat (Shingrix) 11/29/2017    Preventative care: Last colonoscopy: 04/2008 DUE, scheduled for 10/08/2018 CT AB 2016 CXR 03/2017 Echo 2010  Prior vaccinations: TD or Tdap: 2015  Influenza: 07/2018  Pneumococcal: 2014, 2018 Prevnar13: 2017 Shingles/Zostavax: Declined  Names of Other Physician/Practitioners you currently use: 1. [facility] here for primary care 2. Dr. Sabra Heck, eye doctor, last visit June 2018, every 2 years 3. Dr.  Rona Ravens, dentist, last visit 2019, goes q80m  Patient Care Team: Unk Pinto, MD as PCP - General (Internal Medicine) Inda Castle, MD (Inactive) as Consulting Physician (Gastroenterology) Kathie Rhodes, MD as  Consulting Physician (Urology) Barbaraann Cao, OD as Referring Physician (Optometry) Druscilla Brownie, MD as Consulting Physician (Dermatology) Earnie Larsson, MD as Consulting Physician (Neurosurgery)  Allergies Allergies  Allergen Reactions  . Other Hives    Beef-hives  RED MEAT  . Aspirin Hives and Rash  . Salicylates Rash    SURGICAL HISTORY He  has a past surgical history that includes Cervical fusion (2011/2012); Knee arthroscopy; Septoplasty; Shoulder arthroscopy (Left, 2015); Shoulder surgery (Right, 08); Colonoscopy; Back surgery (2016 x 2 L3-L5, 12/2016 S1); and Shoulder arthroscopy with rotator cuff repair (Right, 08/18/2017). FAMILY HISTORY His family history includes Diabetes in his father and mother; Hypertension in his father; Leukemia in his mother. SOCIAL HISTORY He  reports that he quit smoking about 23 years ago. He has never used smokeless tobacco. He reports that he drinks alcohol. He reports that he does not use drugs.  MEDICARE WELLNESS OBJECTIVES: Physical activity: Current Exercise Habits: The patient does not participate in regular exercise at present(active around house, yardwork, housework, spends most of the day on his feet), Exercise limited by: orthopedic condition(s) Cardiac risk factors: Cardiac Risk Factors include: advanced age (>30men, >20 women);dyslipidemia;male gender;hypertension Depression/mood screen:   Depression screen [facility] 2/9 08/21/2018  Decreased Interest 0  Down, Depressed, Hopeless 0  PHQ - 2 Score 0    ADLs:  In your present state of health, do you have any difficulty performing the following activities: 08/21/2018 06/24/2018  Hearing? N N  Vision? N N  Difficulty concentrating or making decisions? N N  Walking or climbing stairs? N N  Dressing or bathing? N N  Doing errands, shopping? N N  Some recent data might be hidden     Cognitive Testing  Alert? Yes  Normal Appearance?Yes  Oriented to person? Yes  Place? Yes    Time? Yes  Recall of three objects?  3/3  Can perform simple calculations? Yes  Displays appropriate judgment?Yes  Can read the correct time from a watch face?Yes  EOL planning: Does Patient Have a Medical Advance Directive?: Yes Type of Advance Directive: Healthcare Power of Attorney, Living will Does patient want to make changes to medical advance directive?: No - Patient declined Copy of [place] in Chart?: No - copy requested   Objective:   Today's Vitals   08/21/18 0902  BP: 124/84  Pulse: (!) 59  Temp: 97.7 F (36.5 C)  SpO2: 97%  Weight: 206 lb (93.4 kg)  Height: 6\' 1"  (1.854 m)  PainSc: 4   PainLoc: Back   Body mass index is 27.18 kg/m.  General appearance: alert, no distress, WD/WN, male HEENT: normocephalic, sclerae anicteric, TMs pearly, nares patent, no discharge or erythema, pharynx normal Oral cavity: MMM, no lesions Neck: supple, no lymphadenopathy, no  thyromegaly, no masses Heart: RRR, normal S1, S2, no murmurs Lungs: CTA bilaterally, no wheezes, rhonchi, or rales Abdomen: +bs, soft, non tender, non distended, no masses, no hepatomegaly, no splenomegaly Musculoskeletal: nontender, no swelling, no obvious deformity Extremities: scant non-pitting pedal edema on R, no cyanosis, no clubbing Pulses: 2+ symmetric, upper and lower extremities, normal cap refill Neurological: alert, oriented x 3, CN2-12 intact, strength normal upper extremities and lower extremities, sensation normal throughout, DTRs 2+ throughout, no cerebellar signs, gait normal Psychiatric: normal affect, behavior normal, pleasant   Medicare Attestation I have personally reviewed: The patient's medical and social history Their use of alcohol, tobacco or illicit drugs Their current medications and supplements The patient's functional ability including ADLs,fall risks, home safety risks, cognitive, and hearing and visual impairment Diet and physical activities Evidence  for depression or mood disorders  The patient's weight, height, BMI, and visual acuity have been recorded in the chart.  I have made referrals, counseling, and provided education to the patient based on review of the above and I have provided the patient with a written personalized care plan for preventive services.     Izora Ribas, NP   08/21/2018

## 2018-08-21 ENCOUNTER — Ambulatory Visit (INDEPENDENT_AMBULATORY_CARE_PROVIDER_SITE_OTHER): Payer: Medicare Other | Admitting: Adult Health

## 2018-08-21 ENCOUNTER — Encounter: Payer: Self-pay | Admitting: Gastroenterology

## 2018-08-21 ENCOUNTER — Encounter: Payer: Self-pay | Admitting: Adult Health

## 2018-08-21 VITALS — BP 124/84 | HR 59 | Temp 97.7°F | Ht 73.0 in | Wt 206.0 lb

## 2018-08-21 DIAGNOSIS — N529 Male erectile dysfunction, unspecified: Secondary | ICD-10-CM

## 2018-08-21 DIAGNOSIS — J449 Chronic obstructive pulmonary disease, unspecified: Secondary | ICD-10-CM

## 2018-08-21 DIAGNOSIS — E663 Overweight: Secondary | ICD-10-CM | POA: Diagnosis not present

## 2018-08-21 DIAGNOSIS — E559 Vitamin D deficiency, unspecified: Secondary | ICD-10-CM

## 2018-08-21 DIAGNOSIS — R6889 Other general symptoms and signs: Secondary | ICD-10-CM

## 2018-08-21 DIAGNOSIS — Z0001 Encounter for general adult medical examination with abnormal findings: Secondary | ICD-10-CM

## 2018-08-21 DIAGNOSIS — K2100 Gastro-esophageal reflux disease with esophagitis, without bleeding: Secondary | ICD-10-CM

## 2018-08-21 DIAGNOSIS — M51369 Other intervertebral disc degeneration, lumbar region without mention of lumbar back pain or lower extremity pain: Secondary | ICD-10-CM

## 2018-08-21 DIAGNOSIS — Z79899 Other long term (current) drug therapy: Secondary | ICD-10-CM | POA: Diagnosis not present

## 2018-08-21 DIAGNOSIS — E349 Endocrine disorder, unspecified: Secondary | ICD-10-CM

## 2018-08-21 DIAGNOSIS — Z981 Arthrodesis status: Secondary | ICD-10-CM | POA: Diagnosis not present

## 2018-08-21 DIAGNOSIS — M5136 Other intervertebral disc degeneration, lumbar region: Secondary | ICD-10-CM

## 2018-08-21 DIAGNOSIS — R7309 Other abnormal glucose: Secondary | ICD-10-CM | POA: Diagnosis not present

## 2018-08-21 DIAGNOSIS — E782 Mixed hyperlipidemia: Secondary | ICD-10-CM | POA: Diagnosis not present

## 2018-08-21 DIAGNOSIS — K5730 Diverticulosis of large intestine without perforation or abscess without bleeding: Secondary | ICD-10-CM

## 2018-08-21 DIAGNOSIS — M4316 Spondylolisthesis, lumbar region: Secondary | ICD-10-CM

## 2018-08-21 DIAGNOSIS — I10 Essential (primary) hypertension: Secondary | ICD-10-CM

## 2018-08-21 DIAGNOSIS — Z0000 Encounter for general adult medical examination without abnormal findings: Secondary | ICD-10-CM

## 2018-08-21 LAB — URINALYSIS, ROUTINE W REFLEX MICROSCOPIC
BILIRUBIN URINE: NEGATIVE
Glucose, UA: NEGATIVE
HGB URINE DIPSTICK: NEGATIVE
Ketones, ur: NEGATIVE
Leukocytes, UA: NEGATIVE
NITRITE: NEGATIVE
PROTEIN: NEGATIVE
Specific Gravity, Urine: 1.015 (ref 1.001–1.03)
pH: 7.5 (ref 5.0–8.0)

## 2018-08-21 NOTE — Patient Instructions (Addendum)
Jacob Larsen , Thank you for taking time to come for your Medicare Wellness Visit. I appreciate your ongoing commitment to your health goals. Please review the following plan we discussed and let me know if I can assist you in the future.   These are the goals we discussed: Goals    . Weight (lb) < 200 lb (90.7 kg)       This is a list of the screening recommended for you and due dates:  Health Maintenance  Topic Date Due  . Colon Cancer Screening  05/07/2018  . Tetanus Vaccine  05/27/2024  . Flu Shot  Completed  .  Hepatitis C: One time screening is recommended by Center for Disease Control  (CDC) for  adults born from [birth year] through 1966.   Completed  . Pneumonia vaccines  Completed    Edema Edema is an abnormal buildup of fluids in your bodytissues. Edema is somewhatdependent on gravity to pull the fluid to the lowest place in your body. That makes the condition more common in the legs and thighs (lower extremities). Painless swelling of the feet and ankles is common and becomes more likely as you get older. It is also common in looser tissues, like around your eyes. When the affected area is squeezed, the fluid may move out of that spot and leave a dent for a few moments. This dent is called pitting. What are the causes? There are many possible causes of edema. Eating too much salt and being on your feet or sitting for a long time can cause edema in your legs and ankles. Hot weather may make edema worse. Common medical causes of edema include:  Heart failure.  Liver disease.  Kidney disease.  Weak blood vessels in your legs.  Cancer.  An injury.  Pregnancy.  Some medications.  Obesity.  What are the signs or symptoms? Edema is usually painless.Your skin may look swollen or shiny. How is this diagnosed? Your health care provider may be able to diagnose edema by asking about your medical history and doing a physical exam. You may need to have tests such as X-rays, an  electrocardiogram, or blood tests to check for medical conditions that may cause edema. How is this treated? Edema treatment depends on the cause. If you have heart, liver, or kidney disease, you need the treatment appropriate for these conditions. General treatment may include:  Elevation of the affected body part above the level of your heart.  Compression of the affected body part. Pressure from elastic bandages or support stockings squeezes the tissues and forces fluid back into the blood vessels. This keeps fluid from entering the tissues.  Restriction of fluid and salt intake.  Use of a water pill (diuretic). These medications are appropriate only for some types of edema. They pull fluid out of your body and make you urinate more often. This gets rid of fluid and reduces swelling, but diuretics can have side effects. Only use diuretics as directed by your health care provider.  Follow these instructions at home:  Keep the affected body part above the level of your heart when you are lying down.  Do not sit still or stand for prolonged periods.  Do not put anything directly under your knees when lying down.  Do not wear constricting clothing or garters on your upper legs.  Exercise your legs to work the fluid back into your blood vessels. This may help the swelling go down.  Wear elastic bandages or support stockings  to reduce ankle swelling as directed by your health care provider.  Eat a low-salt diet to reduce fluid if your health care provider recommends it.  Only take medicines as directed by your health care provider. Contact a health care provider if:  Your edema is not responding to treatment.  You have heart, liver, or kidney disease and notice symptoms of edema.  You have edema in your legs that does not improve after elevating them.  You have sudden and unexplained weight gain. Get help right away if:  You develop shortness of breath or chest pain.  You  cannot breathe when you lie down.  You develop pain, redness, or warmth in the swollen areas.  You have heart, liver, or kidney disease and suddenly get edema.  You have a fever and your symptoms suddenly get worse. This information is not intended to replace advice given to you by your health care provider. Make sure you discuss any questions you have with your health care provider. Document Released: 08/29/2005 Document Revised: 02/04/2016 Document Reviewed: 06/21/2013 Elsevier Interactive Patient Education  2017 [place].     Back Exercises The following exercises strengthen the muscles that help to support the back. They also help to keep the lower back flexible. Doing these exercises can help to prevent back pain or lessen existing pain. If you have back pain or discomfort, try doing these exercises 2-3 times each day or as told by your health care provider. When the pain goes away, do them once each day, but increase the number of times that you repeat the steps for each exercise (do more repetitions). If you do not have back pain or discomfort, do these exercises once each day or as told by your health care provider. Exercises Single Knee to Chest  Repeat these steps 3-5 times for each leg: 1. Lie on your back on a firm bed or the floor with your legs extended. 2. Bring one knee to your chest. Your other leg should stay extended and in contact with the floor. 3. Hold your knee in place by grabbing your knee or thigh. 4. Pull on your knee until you feel a gentle stretch in your lower back. 5. Hold the stretch for 10-30 seconds. 6. Slowly release and straighten your leg.  Pelvic Tilt  Repeat these steps 5-10 times: 1. Lie on your back on a firm bed or the floor with your legs extended. 2. Bend your knees so they are pointing toward the ceiling and your feet are flat on the floor. 3. Tighten your lower abdominal muscles to press your lower back against the floor. This motion  will tilt your pelvis so your tailbone points up toward the ceiling instead of pointing to your feet or the floor. 4. With gentle tension and even breathing, hold this position for 5-10 seconds.  Cat-Cow  Repeat these steps until your lower back becomes more flexible: 1. Get into a hands-and-knees position on a firm surface. Keep your hands under your shoulders, and keep your knees under your hips. You may place padding under your knees for comfort. 2. Let your head hang down, and point your tailbone toward the floor so your lower back becomes rounded like the back of a cat. 3. Hold this position for 5 seconds. 4. Slowly lift your head and point your tailbone up toward the ceiling so your back forms a sagging arch like the back of a cow. 5. Hold this position for 5 seconds.  Press-Ups  Repeat these steps 5-10 times: 1. Lie on your abdomen (face-down) on the floor. 2. Place your palms near your head, about shoulder-width apart. 3. While you keep your back as relaxed as possible and keep your hips on the floor, slowly straighten your arms to raise the top half of your body and lift your shoulders. Do not use your back muscles to raise your upper torso. You may adjust the placement of your hands to make yourself more comfortable. 4. Hold this position for 5 seconds while you keep your back relaxed. 5. Slowly return to lying flat on the floor.  Bridges  Repeat these steps 10 times: 1. Lie on your back on a firm surface. 2. Bend your knees so they are pointing toward the ceiling and your feet are flat on the floor. 3. Tighten your buttocks muscles and lift your buttocks off of the floor until your waist is at almost the same height as your knees. You should feel the muscles working in your buttocks and the back of your thighs. If you do not feel these muscles, slide your feet 1-2 inches farther away from your buttocks. 4. Hold this position for 3-5 seconds. 5. Slowly lower your hips to the  starting position, and allow your buttocks muscles to relax completely.  If this exercise is too easy, try doing it with your arms crossed over your chest. Abdominal Crunches  Repeat these steps 5-10 times: 1. Lie on your back on a firm bed or the floor with your legs extended. 2. Bend your knees so they are pointing toward the ceiling and your feet are flat on the floor. 3. Cross your arms over your chest. 4. Tip your chin slightly toward your chest without bending your neck. 5. Tighten your abdominal muscles and slowly raise your trunk (torso) high enough to lift your shoulder blades a tiny bit off of the floor. Avoid raising your torso higher than that, because it can put too much stress on your low back and it does not help to strengthen your abdominal muscles. 6. Slowly return to your starting position.  Back Lifts Repeat these steps 5-10 times: 1. Lie on your abdomen (face-down) with your arms at your sides, and rest your forehead on the floor. 2. Tighten the muscles in your legs and your buttocks. 3. Slowly lift your chest off of the floor while you keep your hips pressed to the floor. Keep the back of your head in line with the curve in your back. Your eyes should be looking at the floor. 4. Hold this position for 3-5 seconds. 5. Slowly return to your starting position.  Contact a health care provider if:  Your back pain or discomfort gets much worse when you do an exercise.  Your back pain or discomfort does not lessen within 2 hours after you exercise. If you have any of these problems, stop doing these exercises right away. Do not do them again unless your health care provider says that you can. Get help right away if:  You develop sudden, severe back pain. If this happens, stop doing the exercises right away. Do not do them again unless your health care provider says that you can. This information is not intended to replace advice given to you by your health care provider.  Make sure you discuss any questions you have with your health care provider. Document Released: 10/06/2004 Document Revised: 01/06/2016 Document Reviewed: 10/23/2014 Elsevier Interactive Patient Education  2017 [employer].

## 2018-08-22 LAB — CBC WITH DIFFERENTIAL/PLATELET
BASOS ABS: 22 {cells}/uL (ref 0–200)
Basophils Relative: 0.7 %
EOS ABS: 183 {cells}/uL (ref 15–500)
Eosinophils Relative: 5.9 %
HEMATOCRIT: 45.4 % (ref 38.5–50.0)
Hemoglobin: 15.5 g/dL (ref 13.2–17.1)
LYMPHS ABS: 1020 {cells}/uL (ref 850–3900)
MCH: 32.5 pg (ref 27.0–33.0)
MCHC: 34.1 g/dL (ref 32.0–36.0)
MCV: 95.2 fL (ref 80.0–100.0)
MPV: 11.1 fL (ref 7.5–12.5)
Monocytes Relative: 10.7 %
Neutro Abs: 1544 cells/uL (ref 1500–7800)
Neutrophils Relative %: 49.8 %
PLATELETS: 224 10*3/uL (ref 140–400)
RBC: 4.77 10*6/uL (ref 4.20–5.80)
RDW: 12.9 % (ref 11.0–15.0)
TOTAL LYMPHOCYTE: 32.9 %
WBC: 3.1 10*3/uL — ABNORMAL LOW (ref 3.8–10.8)
WBCMIX: 332 {cells}/uL (ref 200–950)

## 2018-08-22 LAB — COMPLETE METABOLIC PANEL WITH GFR
AG Ratio: 1.2 (calc) (ref 1.0–2.5)
ALBUMIN MSPROF: 4.1 g/dL (ref 3.6–5.1)
ALT: 29 U/L (ref 9–46)
AST: 24 U/L (ref 10–35)
Alkaline phosphatase (APISO): 82 U/L (ref 40–115)
BILIRUBIN TOTAL: 0.5 mg/dL (ref 0.2–1.2)
BUN: 13 mg/dL (ref 7–25)
CALCIUM: 9.4 mg/dL (ref 8.6–10.3)
CHLORIDE: 105 mmol/L (ref 98–110)
CO2: 25 mmol/L (ref 20–32)
Creat: 0.93 mg/dL (ref 0.70–1.25)
GFR, Est African American: 98 mL/min/{1.73_m2} (ref >=60)
GFR, Est Non African American: 85 mL/min/{1.73_m2} (ref >=60)
GLOBULIN: 3.3 g/dL (ref 1.9–3.7)
GLUCOSE: 95 mg/dL (ref 65–99)
POTASSIUM: 4.5 mmol/L (ref 3.5–5.3)
Sodium: 139 mmol/L (ref 135–146)
Total Protein: 7.4 g/dL (ref 6.1–8.1)

## 2018-08-22 LAB — LIPID PANEL
CHOL/HDL RATIO: 3.7 (calc) (ref <5.0)
CHOLESTEROL: 158 mg/dL (ref <200)
HDL: 43 mg/dL (ref >40)
LDL Cholesterol (Calc): 93 mg/dL (calc)
Non-HDL Cholesterol (Calc): 115 mg/dL (calc) (ref <130)
Triglycerides: 119 mg/dL (ref <150)

## 2018-08-22 LAB — MAGNESIUM: Magnesium: 1.9 mg/dL (ref 1.5–2.5)

## 2018-08-22 LAB — TSH: TSH: 3.55 m[IU]/L (ref 0.40–4.50)

## 2018-08-23 DIAGNOSIS — M5416 Radiculopathy, lumbar region: Secondary | ICD-10-CM | POA: Diagnosis not present

## 2018-08-23 DIAGNOSIS — I10 Essential (primary) hypertension: Secondary | ICD-10-CM | POA: Diagnosis not present

## 2018-08-23 DIAGNOSIS — Z6825 Body mass index (BMI) 25.0-25.9, adult: Secondary | ICD-10-CM | POA: Diagnosis not present

## 2018-08-27 ENCOUNTER — Other Ambulatory Visit: Payer: Self-pay | Admitting: Internal Medicine

## 2018-08-27 DIAGNOSIS — N320 Bladder-neck obstruction: Secondary | ICD-10-CM

## 2018-09-24 ENCOUNTER — Ambulatory Visit (AMBULATORY_SURGERY_CENTER): Payer: Self-pay

## 2018-09-24 VITALS — Ht 73.5 in | Wt 206.4 lb

## 2018-09-24 DIAGNOSIS — Z1211 Encounter for screening for malignant neoplasm of colon: Secondary | ICD-10-CM

## 2018-09-24 MED ORDER — NA SULFATE-K SULFATE-MG SULF 17.5-3.13-1.6 GM/177ML PO SOLN: 1.0000 | Freq: Once | ORAL | 0 refills | Status: AC

## 2018-09-24 NOTE — Progress Notes (Signed)
Denies allergies to eggs or soy products. Denies complication of anesthesia or sedation. Denies use of weight loss medication. Denies use of O2.   Emmi instructions declined.  

## 2018-09-26 ENCOUNTER — Telehealth: Payer: Self-pay | Admitting: Gastroenterology

## 2018-09-26 ENCOUNTER — Other Ambulatory Visit: Payer: Self-pay | Admitting: Internal Medicine

## 2018-09-26 NOTE — Telephone Encounter (Signed)
Pt wondering if there is a generic form of suprep because copay is over $100.

## 2018-09-26 NOTE — Telephone Encounter (Signed)
Called pt- Got VM- instructed pt to call next week to see if we have a sample Suprep- he needs to call Tuesday or Wednesday- If we have no samples we will have to change to Plenvu-  Procedure 1-27 Monday   [facility]

## 2018-10-02 ENCOUNTER — Telehealth: Payer: Self-pay | Admitting: Gastroenterology

## 2018-10-02 NOTE — Telephone Encounter (Signed)
Called patient again, no answer. Left message for patient to come here at [facility] 3rd floor to pick up sample of suprep. Explained to call us back if he has questions.

## 2018-10-02 NOTE — Telephone Encounter (Signed)
Called pt, no answer. Left message for him to call us back.   Suprep sample kit at front desk 3rd floor. suprep 1 kit lot# [id number]. 11/21.

## 2018-10-08 ENCOUNTER — Encounter: Payer: Self-pay | Admitting: Gastroenterology

## 2018-10-08 ENCOUNTER — Ambulatory Visit (AMBULATORY_SURGERY_CENTER): Payer: Medicare Other | Admitting: Gastroenterology

## 2018-10-08 VITALS — BP 124/97 | HR 54 | Temp 98.6°F | Resp 10 | Ht 73.0 in | Wt 206.0 lb

## 2018-10-08 DIAGNOSIS — Z1211 Encounter for screening for malignant neoplasm of colon: Secondary | ICD-10-CM

## 2018-10-08 MED ORDER — SODIUM CHLORIDE 0.9 % IV SOLN: 500.0000 mL | INTRAVENOUS | Status: DC

## 2018-10-08 NOTE — Progress Notes (Signed)
A and O x3. Report to RN. Tolerated MAC anesthesia well.

## 2018-10-08 NOTE — Op Note (Signed)
[place] Patient Name: Jacob Larsen Procedure Date: 10/08/2018 8:27 AM MRN: [id number] Endoscopist: [place]. Loletha Larsen , MD Age: 68 Referring MD:  Date of Birth: Jun 09, 1951 Gender: Male Account #: [id number] Procedure:                Colonoscopy Indications:              Screening for colorectal malignant neoplasm (no                            polyps 04/2008) Medicines:                Monitored Anesthesia Care Procedure:                Pre-Anesthesia Assessment:                           - Prior to the procedure, a History and Physical                            was performed, and patient medications and                            allergies were reviewed. The patient's tolerance of                            previous anesthesia was also reviewed. The risks                            and benefits of the procedure and the sedation                            options and risks were discussed with the patient.                            All questions were answered, and informed consent                            was obtained. Prior Anticoagulants: The patient has                            taken no previous anticoagulant or antiplatelet                            agents. ASA Grade Assessment: II - A patient with                            mild systemic disease. After reviewing the risks                            and benefits, the patient was deemed in                            satisfactory condition to undergo the procedure.  After obtaining informed consent, the colonoscope                            was passed under direct vision. Throughout the                            procedure, the patient's blood pressure, pulse, and                            oxygen saturations were monitored continuously. The                            Colonoscope was introduced through the anus and                            advanced to the the cecum, identified by                   appendiceal orifice and ileocecal valve. The                            colonoscopy was performed without difficulty. The                            patient tolerated the procedure well. The quality                            of the bowel preparation was good. The ileocecal                            valve, appendiceal orifice, and rectum were                            photographed. Scope In: 8:31:22 AM Scope Out: 8:44:37 AM Scope Withdrawal Time: 0 hours 9 minutes 34 seconds  Total Procedure Duration: 0 hours 13 minutes 15 seconds  Findings:                 The perianal and digital rectal examinations were                            normal.                           Diverticula were found in the entire colon.                           The exam was otherwise without abnormality on                            direct and retroflexion views. Complications:            No immediate complications. Estimated Blood Loss:     Estimated blood loss: none. Impression:               - Diverticulosis in the entire examined colon.                           -  The examination was otherwise normal on direct                            and retroflexion views.                           - No specimens collected. Recommendation:           - Patient has a contact number available for                            emergencies. The signs and symptoms of potential                            delayed complications were discussed with the                            patient. Return to normal activities tomorrow.                            Written discharge instructions were provided to the                            patient.                           - Resume previous diet.                           - Continue present medications.                           - Based on current guidelines, no repeat screening                            colonoscopy due to current age (40 years or older)                             and the absence of colonic polyps. Jacob L. Loletha Carrow, MD 10/08/2018 8:47:08 AM This report has been signed electronically.

## 2018-10-08 NOTE — Progress Notes (Signed)
Pt's states no medical or surgical changes since previsit or office visit. 

## 2018-10-08 NOTE — Patient Instructions (Signed)
YOU HAD AN ENDOSCOPIC PROCEDURE TODAY AT THE [facility]:   Refer to the procedure report that was given to you for any specific questions about what was found during the examination.  If the procedure report does not answer your questions, please call your gastroenterologist to clarify.  If you requested that your care partner not be given the details of your procedure findings, then the procedure report has been included in a sealed envelope for you to review at your convenience later.  YOU SHOULD EXPECT: Some feelings of bloating in the abdomen. Passage of more gas than usual.  Walking can help get rid of the air that was put into your GI tract during the procedure and reduce the bloating. If you had a lower endoscopy (such as a colonoscopy or flexible sigmoidoscopy) you may notice spotting of blood in your stool or on the toilet paper. If you underwent a bowel prep for your procedure, you may not have a normal bowel movement for a few days.  Please Note:  You might notice some irritation and congestion in your nose or some drainage.  This is from the oxygen used during your procedure.  There is no need for concern and it should clear up in a day or so.  SYMPTOMS TO REPORT IMMEDIATELY:   Following lower endoscopy (colonoscopy or flexible sigmoidoscopy):  Excessive amounts of blood in the stool  Significant tenderness or worsening of abdominal pains  Swelling of the abdomen that is new, acute  Fever of 100F or higher  For urgent or emergent issues, a gastroenterologist can be reached at any hour by calling [phone].   DIET:  We do recommend a small meal at first, but then you may proceed to your regular diet.  Drink plenty of fluids but you should avoid alcoholic beverages for 24 hours.  ACTIVITY:  You should plan to take it easy for the rest of today and you should NOT DRIVE or use heavy machinery until tomorrow (because of the sedation medicines used during the test).     FOLLOW UP: Our staff will call the number listed on your records the next business day following your procedure to check on you and address any questions or concerns that you may have regarding the information given to you following your procedure. If we do not reach you, we will leave a message.  However, if you are feeling well and you are not experiencing any problems, there is no need to return our call.  We will assume that you have returned to your regular daily activities without incident.  If any biopsies were taken you will be contacted by phone or by letter within the next 1-3 weeks.  Please call us at [phone] if you have not heard about the biopsies in 3 weeks.    SIGNATURES/CONFIDENTIALITY: You and/or your care partner have signed paperwork which will be entered into your electronic medical record.  These signatures attest to the fact that that the information above on your After Visit Summary has been reviewed and is understood.  Full responsibility of the confidentiality of this discharge information lies with you and/or your care-partner. 

## 2018-10-09 ENCOUNTER — Telehealth: Payer: Self-pay | Admitting: *Deleted

## 2018-10-09 NOTE — Telephone Encounter (Signed)
  Follow up Call-  Call back number 10/08/2018  Post procedure Call Back phone  # [phone]  Permission to leave phone message Yes  Some recent data might be hidden     Patient questions:  Do you have a fever, pain , or abdominal swelling? No. Pain Score  0 *  Have you tolerated food without any problems? Yes.    Have you been able to return to your normal activities? Yes.    Do you have any questions about your discharge instructions: Diet   No. Medications  No. Follow up visit  No.  Do you have questions or concerns about your Care? No.  Actions: * If pain score is 4 or above: No action needed, pain <4.

## 2018-10-18 ENCOUNTER — Telehealth: Payer: Self-pay

## 2018-10-18 NOTE — Telephone Encounter (Signed)
Patient notified and will try OTC meds before making an appointment to be seen

## 2018-10-18 NOTE — Telephone Encounter (Signed)
Patient complaining of sinus drainage and pressure with a cough and mucus in greenish in color. Has been sick for 4 days and is currently taking Alka Seltzer plus and Vicks vapor rub

## 2018-10-25 ENCOUNTER — Other Ambulatory Visit: Payer: Self-pay | Admitting: Internal Medicine

## 2018-10-29 DIAGNOSIS — M5416 Radiculopathy, lumbar region: Secondary | ICD-10-CM | POA: Diagnosis not present

## 2018-10-29 DIAGNOSIS — M961 Postlaminectomy syndrome, not elsewhere classified: Secondary | ICD-10-CM | POA: Diagnosis not present

## 2018-10-29 DIAGNOSIS — Z6825 Body mass index (BMI) 25.0-25.9, adult: Secondary | ICD-10-CM | POA: Diagnosis not present

## 2018-11-01 ENCOUNTER — Other Ambulatory Visit: Payer: Self-pay | Admitting: *Deleted

## 2018-11-02 ENCOUNTER — Other Ambulatory Visit: Payer: Self-pay | Admitting: *Deleted

## 2018-11-02 MED ORDER — OMEPRAZOLE 20 MG PO CPDR: DELAYED_RELEASE_CAPSULE | ORAL | 1 refills | Status: DC

## 2018-11-22 DIAGNOSIS — M961 Postlaminectomy syndrome, not elsewhere classified: Secondary | ICD-10-CM | POA: Diagnosis not present

## 2018-11-22 DIAGNOSIS — Z6825 Body mass index (BMI) 25.0-25.9, adult: Secondary | ICD-10-CM | POA: Diagnosis not present

## 2018-12-12 ENCOUNTER — Other Ambulatory Visit: Payer: Self-pay | Admitting: Internal Medicine

## 2018-12-12 DIAGNOSIS — I10 Essential (primary) hypertension: Secondary | ICD-10-CM

## 2018-12-26 DIAGNOSIS — R7303 Prediabetes: Secondary | ICD-10-CM

## 2018-12-26 DIAGNOSIS — K219 Gastro-esophageal reflux disease without esophagitis: Secondary | ICD-10-CM | POA: Insufficient documentation

## 2018-12-26 HISTORY — DX: Prediabetes: R73.03

## 2018-12-26 NOTE — Patient Instructions (Signed)

## 2018-12-26 NOTE — Progress Notes (Signed)
History of Present Illness:      This very nice 68 y.o. MBM presents for 6 month follow up with HTN, HLD,  Testosterone Deficiency, GERD,  Prediabetes and Vitamin D Deficiency. Patient also has Chronic Pain syndrome consequent of  Lumbar DDD with hx/o Lumbar fusions x 2 in 2016. (Patient has had #2 neck surgeries and #4 back surgeries- last L5-S1 Decompression/Fusion 04/18 by Dr Annette Stable).      Patient has hx/o HTN controlled on current meds  & BP has been controlled at home. Today's BP is at goal - 130/84. Patient has had no complaints of any cardiac type chest pain, palpitations, dyspnea / orthopnea / PND, dizziness, claudication, or dependent edema.      Hyperlipidemia is controlled with diet & meds. Patient denies myalgias or other med SE's. Last Lipids were at goal:  Lab Results  Component Value Date   CHOL 139 12/27/2018   HDL 38 (L) 12/27/2018   LDLCALC 78 12/27/2018   TRIG 131 12/27/2018   CHOLHDL 3.7 12/27/2018       Also, the patient has history of PreDiabetes (A1c 6.0% / 2014) and has had no symptoms of reactive hypoglycemia, diabetic polys, paresthesias or visual blurring.  Last A1c was Normal at goal: Lab Results  Component Value Date   HGBA1C 5.6 12/27/2018       Patient is on Testosterone replacement (2010) with improved sense of well being and stamina.      Further, the patient also has history of Vitamin D Deficiency and supplements vitamin D without any suspected side-effects. Last vitamin D was at goal: Lab Results  Component Value Date   VD25OH 60 12/27/2018   Current Outpatient Medications on File Prior to Visit  Medication Sig  . atorvastatin (LIPITOR) 80 MG tablet TAKE 1 TABLET (80 MG TOTAL) BY MOUTH DAILY. FOR CHOLESTEROL  . bisoprolol (ZEBETA) 10 MG tablet TAKE 1 TABLET DAILY FOR BLOOD PRESSURE  . Cholecalciferol (VITAMIN D) 2000 UNITS tablet Take 4,000 Units by mouth daily.   . diclofenac (VOLTAREN) 75 MG EC tablet TAKE 1 TABLET BY MOUTH TWICE A DAY  .  finasteride (PROSCAR) 5 MG tablet TAKE 1 TABLET DAILY FOR PROSTATE  . Flaxseed, Linseed, (FLAX SEED OIL) 1000 MG CAPS Take 1 capsule (1,000 mg total) by mouth 2 (two) times daily.  Marland Kitchen loratadine (CLARITIN) 10 MG tablet Take 1 tablet (10 mg total) by mouth as needed for allergies.  . Magnesium 250 MG TABS Take 250 mg by mouth daily.  Marland Kitchen omeprazole (PRILOSEC) 20 MG capsule Take 1 capsule twice a day for reflux.  Marland Kitchen oxyCODONE (OXY IR/ROXICODONE) 5 MG immediate release tablet Take 5 mg by mouth every 4 (four) hours as needed for severe pain.  . tamsulosin (FLOMAX) 0.4 MG CAPS capsule TAKE 2 CAPSULES DAILY FOR PROSTATE  . testosterone cypionate (DEPOTESTOSTERONE CYPIONATE) 200 MG/ML injection INJECT 2MLS INTRAMUSCULARLY EVERY 2 WEEKS AS DIRECTED  . vitamin B-12 (CYANOCOBALAMIN) 1000 MCG tablet Take 1,000 mcg by mouth daily.  . sildenafil (VIAGRA) 100 MG tablet Take 1 tablet (100 mg total) by mouth as needed for erectile dysfunction.   No current facility-administered medications on file prior to visit.    Allergies  Allergen Reactions  . Other Hives    Beef-hives - RED MEAT  . Aspirin Hives and Rash  . Salicylates Rash   PMHx:   Past Medical History:  Diagnosis Date  . Allergy   . Anxiety    takes Valium daily as needed  .  Arthritis    in neck  . BPH (benign prostatic hyperplasia)    takes Flomax daily  . Chronic back pain    DDD  . COPD (chronic obstructive pulmonary disease) ([place])   . Diverticulosis   . Foot drop    both feet  . GERD (gastroesophageal reflux disease)    takes Omeprazole daily as well as Zantac  . Hyperlipidemia    takes Atorvastatin daily  . Hypertension   . Hypogonadism male   . Nocturia   . Numbness    only in 4th and 5th finger on left hand and left leg  . Seasonal allergies    takes Claritin daily as needed and uses Flonase daily as needed  . Urinary frequency    takes Flomax daily   Immunization History  Administered Date(s) Administered  .  Influenza Split 06/18/2015  . Influenza, High Dose Seasonal PF 06/20/2016, 06/06/2017, 07/31/2018  . Influenza-Unspecified 08/17/2014  . PPD Test 05/27/2014  . Pneumococcal Conjugate-13 12/09/2015  . Pneumococcal Polysaccharide-23 04/08/2013, 06/06/2017  . Td 09/13/2003  . Tdap 05/27/2014  . Zoster Recombinat (Shingrix) 11/29/2017   Past Surgical History:  Procedure Laterality Date  . BACK SURGERY  2016 x 2 L3-L5, 12/2016 S1   lumbar fusion x 3  . CERVICAL FUSION  2011/2012  . COLONOSCOPY    . KNEE ARTHROSCOPY     right 1985; left 1992  . SEPTOPLASTY    . SHOULDER ARTHROSCOPY Left 2015  . SHOULDER ARTHROSCOPY WITH ROTATOR CUFF REPAIR Right 08/18/2017   Dr. Onnie Graham  . SHOULDER SURGERY Right 08   FHx:    Reviewed / unchanged  SHx:    Reviewed / unchanged   Systems Review:  Constitutional: Denies fever, chills, wt changes, headaches, insomnia, fatigue, night sweats, change in appetite. Eyes: Denies redness, blurred vision, diplopia, discharge, itchy, watery eyes.  ENT: Denies discharge, congestion, post nasal drip, epistaxis, sore throat, earache, hearing loss, dental pain, tinnitus, vertigo, sinus pain, snoring.  CV: Denies chest pain, palpitations, irregular heartbeat, syncope, dyspnea, diaphoresis, orthopnea, PND, claudication or edema. Respiratory: denies cough, dyspnea, DOE, pleurisy, hoarseness, laryngitis, wheezing.  Gastrointestinal: Denies dysphagia, odynophagia, heartburn, reflux, water brash, abdominal pain or cramps, nausea, vomiting, bloating, diarrhea, constipation, hematemesis, melena, hematochezia  or hemorrhoids. Genitourinary: Denies dysuria, frequency, urgency, nocturia, hesitancy, discharge, hematuria or flank pain. Musculoskeletal: Denies arthralgias, myalgias, stiffness, jt. swelling, pain, limping or strain/sprain.  Skin: Denies pruritus, rash, hives, warts, acne, eczema or change in skin lesion(s). Neuro: No weakness, tremor, incoordination, spasms,  paresthesia or pain. Psychiatric: Denies confusion, memory loss or sensory loss. Endo: Denies change in weight, skin or hair change.  Heme/Lymph: No excessive bleeding, bruising or enlarged lymph nodes.  Physical Exam  BP 130/84   Pulse (!) 56   Temp (!) 97.5 F (36.4 C)   Resp 16   Ht 6\' 1"  (1.854 m)   Wt 202 lb 9.6 oz (91.9 kg)   BMI 26.73 kg/m   Appears  well nourished, well groomed  and in no distress.  Eyes: PERRLA, EOMs, conjunctiva no swelling or erythema. Sinuses: No frontal/maxillary tenderness ENT/Mouth: EAC's clear, TM's nl w/o erythema, bulging. Nares clear w/o erythema, swelling, exudates. Oropharynx clear without erythema or exudates. Oral hygiene is good. Tongue normal, non obstructing. Hearing intact.  Neck: Supple. Thyroid not palpable. Car 2+/2+ without bruits, nodes or JVD. Chest: Respirations nl with BS clear & equal w/o rales, rhonchi, wheezing or stridor.  Cor: Heart sounds normal w/ regular rate and rhythm without sig.  murmurs, gallops, clicks or rubs. Peripheral pulses normal and equal  without edema.  Abdomen: Soft & bowel sounds normal. Non-tender w/o guarding, rebound, hernias, masses or organomegaly.  Lymphatics: Unremarkable.  Musculoskeletal: Full ROM all peripheral extremities, joint stability, 5/5 strength and normal gait.  Skin: Warm, dry without exposed rashes, lesions or ecchymosis apparent.  Neuro: Cranial nerves intact, reflexes equal bilaterally. Sensory-motor testing grossly intact. Tendon reflexes grossly intact.  Pysch: Alert & oriented x 3.  Insight and judgement nl & appropriate. No ideations.  Assessment and Plan:  1. Essential hypertension  - Continue medication, monitor blood pressure at home.  - Continue DASH diet.  Reminder to go to the ER if any CP,  SOB, nausea, dizziness, severe HA, changes vision/speech.  - CBC with Differential/Platelet - COMPLETE METABOLIC PANEL WITH GFR - Magnesium - TSH  2. Hyperlipidemia, mixed  -  Continue diet/meds, exercise,& lifestyle modifications.  - Continue monitor periodic cholesterol/liver & renal functions   - Lipid panel - TSH  3. Abnormal glucose  - Continue diet, exercise  - Lifestyle modifications.  - Monitor appropriate labs.  - Hemoglobin A1c - Insulin, random  4. Vitamin D deficiency  - Continue supplementation.  - VITAMIN D 25 Hydroxyl  5. Prediabetes  - Hemoglobin A1c - Insulin, random  6. Testosterone deficiency  - Testosterone  7. Gastroesophageal reflux disease  - CBC with Differential/Platelet  8. Medication management  - CBC with Differential/Platelet - COMPLETE METABOLIC PANEL WITH GFR - Magnesium - Lipid panel - TSH - Hemoglobin A1c - Insulin, random - VITAMIN D 25 Hydroxyl - Testosterone       Patient was given Sx's Lyrica 50 mg #21 x 3 = 63 - advised to try 1 or 2 caps 1 to 2 x /day for his chronic pain        Discussed  regular exercise, BP monitoring, weight control to achieve/maintain BMI less than 25 and discussed med and SE's. Recommended labs to assess and monitor clinical status with further disposition pending results of labs. I discussed the assessment and treatment plan with the patient. The patient was provided an opportunity to ask questions and all were answered. The patient agreed with the plan and demonstrated an understanding of the instructions. Over 30 minutes of exam, counseling, chart review was performed.       I provided  minutes of non-face-to-face time during this encounter and over 40 minutes of exam, counseling, chart review and  complex critical decision making was performed   Kirtland Bouchard, MD

## 2018-12-27 ENCOUNTER — Ambulatory Visit (INDEPENDENT_AMBULATORY_CARE_PROVIDER_SITE_OTHER): Payer: Medicare Other | Admitting: Internal Medicine

## 2018-12-27 ENCOUNTER — Other Ambulatory Visit: Payer: Self-pay | Admitting: *Deleted

## 2018-12-27 ENCOUNTER — Other Ambulatory Visit: Payer: Self-pay

## 2018-12-27 VITALS — BP 130/84 | HR 56 | Temp 97.5°F | Resp 16 | Ht 73.0 in | Wt 202.6 lb

## 2018-12-27 DIAGNOSIS — E559 Vitamin D deficiency, unspecified: Secondary | ICD-10-CM

## 2018-12-27 DIAGNOSIS — E782 Mixed hyperlipidemia: Secondary | ICD-10-CM | POA: Diagnosis not present

## 2018-12-27 DIAGNOSIS — I10 Essential (primary) hypertension: Secondary | ICD-10-CM

## 2018-12-27 DIAGNOSIS — R7303 Prediabetes: Secondary | ICD-10-CM | POA: Diagnosis not present

## 2018-12-27 DIAGNOSIS — K219 Gastro-esophageal reflux disease without esophagitis: Secondary | ICD-10-CM

## 2018-12-27 DIAGNOSIS — R7309 Other abnormal glucose: Secondary | ICD-10-CM | POA: Diagnosis not present

## 2018-12-27 DIAGNOSIS — Z79899 Other long term (current) drug therapy: Secondary | ICD-10-CM

## 2018-12-27 DIAGNOSIS — E349 Endocrine disorder, unspecified: Secondary | ICD-10-CM

## 2018-12-27 DIAGNOSIS — H6593 Unspecified nonsuppurative otitis media, bilateral: Secondary | ICD-10-CM

## 2018-12-27 MED ORDER — FLUTICASONE PROPIONATE 50 MCG/ACT NA SUSP: 2.0000 | Freq: Every day | NASAL | 3 refills | Status: DC

## 2018-12-27 MED ORDER — NEEDLES & SYRINGES MISC: 4 refills | Status: DC

## 2018-12-28 ENCOUNTER — Encounter: Payer: Self-pay | Admitting: Internal Medicine

## 2018-12-28 LAB — COMPLETE METABOLIC PANEL WITH GFR
AG Ratio: 1.3 (calc) (ref 1.0–2.5)
ALT: 23 U/L (ref 9–46)
AST: 17 U/L (ref 10–35)
Albumin: 4 g/dL (ref 3.6–5.1)
Alkaline phosphatase (APISO): 70 U/L (ref 35–144)
BUN: 12 mg/dL (ref 7–25)
CO2: 28 mmol/L (ref 20–32)
Calcium: 9.8 mg/dL (ref 8.6–10.3)
Chloride: 104 mmol/L (ref 98–110)
Creat: 0.95 mg/dL (ref 0.70–1.25)
GFR, Est African American: 95 mL/min/{1.73_m2} (ref >=60)
GFR, Est Non African American: 82 mL/min/{1.73_m2} (ref >=60)
Globulin: 3.1 g/dL (calc) (ref 1.9–3.7)
Glucose, Bld: 90 mg/dL (ref 65–99)
Potassium: 4.5 mmol/L (ref 3.5–5.3)
Sodium: 138 mmol/L (ref 135–146)
Total Bilirubin: 0.5 mg/dL (ref 0.2–1.2)
Total Protein: 7.1 g/dL (ref 6.1–8.1)

## 2018-12-28 LAB — VITAMIN D 25 HYDROXY (VIT D DEFICIENCY, FRACTURES): Vit D, 25-Hydroxy: 60 ng/mL (ref 30–100)

## 2018-12-28 LAB — CBC WITH DIFFERENTIAL/PLATELET
Absolute Monocytes: 300 cells/uL (ref 200–950)
Basophils Absolute: 20 cells/uL (ref 0–200)
Basophils Relative: 0.6 %
Eosinophils Absolute: 79 cells/uL (ref 15–500)
Eosinophils Relative: 2.4 %
HCT: 44.2 % (ref 38.5–50.0)
Hemoglobin: 15.4 g/dL (ref 13.2–17.1)
Lymphs Abs: 1046 cells/uL (ref 850–3900)
MCH: 33.4 pg — ABNORMAL HIGH (ref 27.0–33.0)
MCHC: 34.8 g/dL (ref 32.0–36.0)
MCV: 95.9 fL (ref 80.0–100.0)
MPV: 11 fL (ref 7.5–12.5)
Monocytes Relative: 9.1 %
Neutro Abs: 1855 cells/uL (ref 1500–7800)
Neutrophils Relative %: 56.2 %
Platelets: 229 10*3/uL (ref 140–400)
RBC: 4.61 10*6/uL (ref 4.20–5.80)
RDW: 13 % (ref 11.0–15.0)
Total Lymphocyte: 31.7 %
WBC: 3.3 10*3/uL — ABNORMAL LOW (ref 3.8–10.8)

## 2018-12-28 LAB — TSH: TSH: 2.88 mIU/L (ref 0.40–4.50)

## 2018-12-28 LAB — TESTOSTERONE: Testosterone: 1136 ng/dL — ABNORMAL HIGH (ref 250–827)

## 2018-12-28 LAB — INSULIN, RANDOM: Insulin: 9.3 u[IU]/mL

## 2018-12-28 LAB — LIPID PANEL
Cholesterol: 139 mg/dL (ref <200)
HDL: 38 mg/dL — ABNORMAL LOW (ref >=40)
LDL Cholesterol (Calc): 78 mg/dL (calc)
Non-HDL Cholesterol (Calc): 101 mg/dL (calc) (ref <130)
Total CHOL/HDL Ratio: 3.7 (calc) (ref <5.0)
Triglycerides: 131 mg/dL (ref <150)

## 2018-12-28 LAB — HEMOGLOBIN A1C
Hgb A1c MFr Bld: 5.6 % of total Hgb (ref <5.7)
Mean Plasma Glucose: 114 (calc)
eAG (mmol/L): 6.3 (calc)

## 2018-12-28 LAB — MAGNESIUM: Magnesium: 1.9 mg/dL (ref 1.5–2.5)

## 2019-01-01 ENCOUNTER — Other Ambulatory Visit: Payer: Self-pay | Admitting: Internal Medicine

## 2019-01-13 ENCOUNTER — Other Ambulatory Visit: Payer: Self-pay | Admitting: Internal Medicine

## 2019-01-18 DIAGNOSIS — M961 Postlaminectomy syndrome, not elsewhere classified: Secondary | ICD-10-CM | POA: Diagnosis not present

## 2019-02-27 ENCOUNTER — Other Ambulatory Visit: Payer: Self-pay | Admitting: Adult Health

## 2019-02-27 DIAGNOSIS — N320 Bladder-neck obstruction: Secondary | ICD-10-CM

## 2019-03-21 ENCOUNTER — Other Ambulatory Visit: Payer: Self-pay | Admitting: Internal Medicine

## 2019-03-21 DIAGNOSIS — H6593 Unspecified nonsuppurative otitis media, bilateral: Secondary | ICD-10-CM

## 2019-03-24 ENCOUNTER — Other Ambulatory Visit: Payer: Self-pay | Admitting: Internal Medicine

## 2019-04-01 ENCOUNTER — Ambulatory Visit (INDEPENDENT_AMBULATORY_CARE_PROVIDER_SITE_OTHER): Payer: Medicare Other | Admitting: Physician Assistant

## 2019-04-01 ENCOUNTER — Encounter: Payer: Self-pay | Admitting: Physician Assistant

## 2019-04-01 ENCOUNTER — Other Ambulatory Visit: Payer: Self-pay

## 2019-04-01 ENCOUNTER — Other Ambulatory Visit: Payer: Self-pay | Admitting: Physician Assistant

## 2019-04-01 VITALS — BP 130/78 | HR 52 | Temp 98.1°F | Resp 14 | Ht 74.0 in | Wt 198.8 lb

## 2019-04-01 DIAGNOSIS — I10 Essential (primary) hypertension: Secondary | ICD-10-CM

## 2019-04-01 DIAGNOSIS — E538 Deficiency of other specified B group vitamins: Secondary | ICD-10-CM

## 2019-04-01 DIAGNOSIS — R001 Bradycardia, unspecified: Secondary | ICD-10-CM | POA: Diagnosis not present

## 2019-04-01 DIAGNOSIS — Z79899 Other long term (current) drug therapy: Secondary | ICD-10-CM | POA: Diagnosis not present

## 2019-04-01 DIAGNOSIS — E782 Mixed hyperlipidemia: Secondary | ICD-10-CM | POA: Diagnosis not present

## 2019-04-01 DIAGNOSIS — E559 Vitamin D deficiency, unspecified: Secondary | ICD-10-CM | POA: Diagnosis not present

## 2019-04-01 DIAGNOSIS — E611 Iron deficiency: Secondary | ICD-10-CM

## 2019-04-01 MED ORDER — OLMESARTAN MEDOXOMIL 20 MG PO TABS: 20.0000 mg | ORAL_TABLET | Freq: Every day | ORAL | 3 refills | Status: DC

## 2019-04-01 NOTE — Progress Notes (Signed)
FOLLOW UP  Assessment and Plan:   Hypertension Due to bradycardia, cut the ziac in half and start benicar 20 mg, then after 1 month just go to benicar 40 mg daily and stop ziac due to bradycardia Monitor Heart rate as well Monitor blood pressure at home; patient to call if consistently greater than 130/80 Continue DASH diet.   Reminder to go to the ER if any CP, SOB, nausea, dizziness, severe HA, changes vision/speech, left arm numbness and tingling and jaw pain.  Cholesterol Currently at goal; continue current medications Continue low cholesterol diet and exercise.  Check lipid panel.   Overweight Long discussion about weight loss, diet, and exercise Recommended diet heavy in fruits and veggies and low in animal meats, cheeses, and dairy products, appropriate calorie intake Discussed ideal weight for height Will follow up in 3 months  Vitamin D Def Below goal at last visit; continue supplementation to maintain goal of 70-100 Check Vit D level  Morton neuroma Given information, follow up ortho first but can refer to podiatry  Right arm numbness ? Entrapment from shoulder versus elbow of ulnar nerve, will follow up ortho Try lyrica samples from Dr. Melford Aase he has at home  Continue diet and meds as discussed. Further disposition pending results of labs. Discussed med's effects and SE's.   Over 30 minutes of exam, counseling, chart review, and critical decision making was performed.   Future Appointments  Date Time Provider [place]  07/10/2019  9:00 AM Unk Pinto, MD [facility] None  08/28/2019  9:00 AM Liane Comber, NP [facility] None  04/08/2020 10:00 AM Vicie Mutters, PA-C [facility] None    ----------------------------------------------------------------------------------------------------------------------  HPI 68 y.o. male  presents for 3 month follow up on hypertension, cholesterol, diabetes, weight and vitamin D deficiency.   He is managed by  Dr. Earnie Larsson for chronic lumbar pain, s/p cervical fusion in 2011 and 2012, and lumbar fusion x 3 in 2016 x 2 and in 2018 - S1 - L3, and is prescribed pain medication through that clinic. He is complaining right ulnar forearm pain, worse with sitting long time, has noticed some weakness in it. He is fishing every week. He states he is also very achy.  Lab Results  Component Value Date   IRON 82 05/28/2015   TIBC 274 05/28/2015   Lab Results  Component Value Date   [id number] 098 (H) 05/28/2015   He states his feet are more swelling, gets better at night. Wears compression socks. States he is not walking as much as he use to due to back pain and leg weakness. He states he can lay flat, no SOB that is different.  Study Conclusions   1. Left ventricle: The estimated ejection fraction was 60%. Wall   motion was normal; there were no regional wall motion   abnormalities.  2. Mitral valve: Trivial regurgitation.  3. Right ventricle: The cavity size was mildly dilated. Systolic   function was low normal.  4. Tricuspid valve: Mild regurgitation.   BMI is Body mass index is 25.52 kg/m., he has been working on diet and exercise. Wt Readings from Last 3 Encounters:  04/01/19 198 lb 12.8 oz (90.2 kg)  12/27/18 202 lb 9.6 oz (91.9 kg)  10/08/18 206 lb (93.4 kg)   His blood pressure has been controlled at home started on bisoprolol last visit, states tolerating well, today their BP is BP: 130/78  He does workout. He denies chest pain, shortness of breath, dizziness.   He is on  cholesterol medication (atorvastatin 80 mg daily) and denies myalgias. His cholesterol is at goal. The cholesterol last visit was:   Lab Results  Component Value Date   CHOL 139 12/27/2018   HDL 38 (L) 12/27/2018   LDLCALC 78 12/27/2018   TRIG 131 12/27/2018   CHOLHDL 3.7 12/27/2018    He has been working on diet and exercise for prediabetes, and denies foot ulcerations, increased appetite, nausea,  paresthesia of the feet, polydipsia, polyuria, visual disturbances, vomiting and weight loss. Last A1C in the office was:  Lab Results  Component Value Date   HGBA1C 5.6 12/27/2018   Lab Results  Component Value Date   GFRAA 95 12/27/2018   Patient is on Vitamin D supplement.   Lab Results  Component Value Date   VD25OH 60 12/27/2018     He has a history of testosterone deficiency and is on testosterone replacement. He states that the testosterone helps with his energy, libido, muscle mass. Lab Results  Component Value Date   TESTOSTERONE 1,136 (H) 12/27/2018     Current Medications:  Current Outpatient Medications on File Prior to Visit  Medication Sig  . atorvastatin (LIPITOR) 80 MG tablet TAKE 1 TABLET (80 MG TOTAL) BY MOUTH DAILY. FOR CHOLESTEROL  . bisoprolol (ZEBETA) 10 MG tablet TAKE 1 TABLET DAILY FOR BLOOD PRESSURE  . Cholecalciferol (VITAMIN D) 2000 UNITS tablet Take 4,000 Units by mouth daily.   . diclofenac (VOLTAREN) 75 MG EC tablet TAKE 1 TABLET BY MOUTH TWICE A DAY  . finasteride (PROSCAR) 5 MG tablet Take 1 tablet Daily for Prostate  . Flaxseed, Linseed, (FLAX SEED OIL) 1000 MG CAPS Take 1 capsule (1,000 mg total) by mouth 2 (two) times daily.  . fluticasone (FLONASE) 50 MCG/ACT nasal spray SPRAY 2 SPRAYS INTO EACH NOSTRIL EVERY DAY  . loratadine (CLARITIN) 10 MG tablet Take 1 tablet (10 mg total) by mouth as needed for allergies.  . Magnesium 250 MG TABS Take 250 mg by mouth daily.  . Needles & Syringes MISC Use for Testosterone inject every 2 weeks.  Marland Kitchen omeprazole (PRILOSEC) 20 MG capsule Take n1 to 2 capsules / Daily as needed for Reflux  . oxyCODONE (OXY IR/ROXICODONE) 5 MG immediate release tablet Take 5 mg by mouth every 4 (four) hours as needed for severe pain.  . tamsulosin (FLOMAX) 0.4 MG CAPS capsule TAKE 2 CAPSULES DAILY FOR PROSTATE  . testosterone cypionate (DEPOTESTOSTERONE CYPIONATE) 200 MG/ML injection Inject 2 mls into muscle every 2 weeks  .  vitamin B-12 (CYANOCOBALAMIN) 1000 MCG tablet Take 1,000 mcg by mouth daily.  . sildenafil (VIAGRA) 100 MG tablet Take 1 tablet (100 mg total) by mouth as needed for erectile dysfunction.   No current facility-administered medications on file prior to visit.      Allergies:  Allergies  Allergen Reactions  . Other Hives    Beef-hives  RED MEAT  . Aspirin Hives and Rash  . Salicylates Rash     Medical History:  Past Medical History:  Diagnosis Date  . Allergy   . Anxiety    takes Valium daily as needed  . Arthritis    in neck  . BPH (benign prostatic hyperplasia)    takes Flomax daily  . Chronic back pain    DDD  . COPD (chronic obstructive pulmonary disease) ([place])   . Diverticulosis   . Foot drop    both feet  . GERD (gastroesophageal reflux disease)    takes Omeprazole daily as well as Zantac  .  Hyperlipidemia    takes Atorvastatin daily  . Hypertension   . Hypogonadism male   . Nocturia   . Numbness    only in 4th and 5th finger on left hand and left leg  . Seasonal allergies    takes Claritin daily as needed and uses Flonase daily as needed  . Urinary frequency    takes Flomax daily   Family history- Reviewed and unchanged Social history- Reviewed and unchanged   Review of Systems:  Review of Systems  Constitutional: Negative for malaise/fatigue and weight loss.  HENT: Negative for hearing loss and tinnitus.   Eyes: Negative for blurred vision and double vision.  Respiratory: Negative for cough, shortness of breath and wheezing.   Cardiovascular: Negative for chest pain, palpitations, orthopnea, claudication and leg swelling.  Gastrointestinal: Negative for abdominal pain, blood in stool, constipation, diarrhea, heartburn, melena, nausea and vomiting.  Genitourinary: Negative.   Musculoskeletal: Positive for back pain. Negative for falls, joint pain and myalgias.  Skin: Negative for rash.  Neurological: Negative for dizziness, tingling, sensory change,  weakness and headaches.  Endo/Heme/Allergies: Negative for polydipsia.  Psychiatric/Behavioral: Negative.   All other systems reviewed and are negative.   Physical Exam: BP 130/78   Pulse (!) 52   Temp 98.1 F (36.7 C) (Temporal)   Resp 14   Ht 6\' 2"  (1.88 m)   Wt 198 lb 12.8 oz (90.2 kg)   SpO2 97%   BMI 25.52 kg/m  Wt Readings from Last 3 Encounters:  04/01/19 198 lb 12.8 oz (90.2 kg)  12/27/18 202 lb 9.6 oz (91.9 kg)  10/08/18 206 lb (93.4 kg)   General Appearance: Well nourished, in no apparent distress. Eyes: PERRLA, EOMs, conjunctiva no swelling or erythema Sinuses: No Frontal/maxillary tenderness ENT/Mouth: Ext aud canals clear, TMs without erythema, bulging. No erythema, swelling, or exudate on post pharynx.  Tonsils not swollen or erythematous. Hearing normal.  Neck: Supple, thyroid normal.  Respiratory: Respiratory effort normal, BS equal bilaterally without rales, rhonchi, wheezing or stridor.  Cardio: RRR with no MRGs. Brisk peripheral pulses without edema.  Abdomen: Soft, + BS.  Non tender, no guarding, rebound, hernias, masses. Lymphatics: Non tender without lymphadenopathy.  Musculoskeletal: Full ROM, 5/5 strength, Normal gait Skin: Warm, dry without rashes, lesions, ecchymosis.  Neuro: Cranial nerves intact. No cerebellar symptoms.  Psych: Awake and oriented X 3, normal affect, Insight and Judgment appropriate.    Vicie Mutters, PA-C 11:52 AM [facility]

## 2019-04-01 NOTE — Patient Instructions (Addendum)
CUT YOUR BLOOD PRESSURE MEDICATION IN HALF DUE TO LOW HEART RATE START ON BENICAR 20 MG DAILY AND MONITOR BP ON 1/2 OF BIOPROLOL AND 20 MG OF BENICAR x 1 month  Then stop the bioprolol and do benicar 40 mg once a day     Bradycardia, Adult Bradycardia is a slower-than-normal heartbeat. A normal resting heart rate for an adult ranges from 60 to 100 beats per minute. With bradycardia, the resting heart rate is less than 60 beats per minute. Bradycardia can prevent enough oxygen from reaching certain areas of your body when you are active. It can be serious if it keeps enough oxygen from reaching your brain and other parts of your body. Bradycardia is not a problem for everyone. For some healthy adults, a slow resting heart rate is normal. What are the causes? This condition may be caused by:  A problem with the heart, including: ? A problem with the heart's electrical system, such as a heart block. With a heart block, electrical signals between the chambers of the heart are partially or completely blocked, so they are not able to work as they should. ? A problem with the heart's natural pacemaker (sinus node). ? Heart disease. ? A heart attack. ? Heart damage. ? Lyme disease. ? A heart infection. ? A heart condition that is present at birth (congenital heart defect).  Certain medicines that treat heart conditions.  Certain conditions, such as hypothyroidism and obstructive sleep apnea.  Problems with the balance of chemicals and other substances, like potassium, in the blood.  Trauma.  Radiation therapy. What increases the risk? You are more likely to develop this condition if you:  Are age 69 or older.  Have high blood pressure (hypertension), high cholesterol (hyperlipidemia), or diabetes.  Drink heavily, use tobacco or nicotine products, or use drugs. What are the signs or symptoms? Symptoms of this condition include:  Light-headedness.  Feeling faint or  fainting.  Fatigue and weakness.  Trouble with activity or exercise.  Shortness of breath.  Chest pain (angina).  Drowsiness.  Confusion.  Dizziness. How is this diagnosed? This condition may be diagnosed based on:  Your symptoms.  Your medical history.  A physical exam. During the exam, your health care provider will listen to your heartbeat and check your pulse. To confirm the diagnosis, your health care provider may order tests, such as:  Blood tests.  An electrocardiogram (ECG). This test records the heart's electrical activity. The test can show how fast your heart is beating and whether the heartbeat is steady.  A test in which you wear a portable device (event recorder or Holter monitor) to record your heart's electrical activity while you go about your day.  Anexercise test. How is this treated? Treatment for this condition depends on the cause of the condition and how severe your symptoms are. Treatment may involve:  Treatment of the underlying condition.  Changing your medicines or how much medicine you take.  Having a small, battery-operated device called a pacemaker implanted under the skin. When bradycardia occurs, this device can be used to increase your heart rate and help your heart beat in a regular rhythm. Follow these instructions at home: Lifestyle   Manage any health conditions that contribute to bradycardia as told by your health care provider.  Follow a heart-healthy diet. A nutrition specialist (dietitian) can help educate you about healthy food options and changes.  Follow an exercise program that is approved by your health care provider.  Maintain  a healthy weight.  Try to reduce or manage your stress, such as with yoga or meditation. If you need help reducing stress, ask your health care provider.  Do not use any products that contain nicotine or tobacco, such as cigarettes, e-cigarettes, and chewing tobacco. If you need help quitting,  ask your health care provider.  Do not use illegal drugs.  Limit alcohol intake to no more than 1 drink a day for nonpregnant women and 2 drinks a day for men. Be aware of how much alcohol is in your drink. In the [place] one drink equals one 12 oz bottle of beer (355 mL), one 5 oz glass of wine (148 mL), or one 1 oz glass of hard liquor (44 mL). General instructions  Take over-the-counter and prescription medicines only as told by your health care provider.  Keep all follow-up visits as told by your health care provider. This is important. How is this prevented? In some cases, bradycardia may be prevented by:  Treating underlying medical problems.  Stopping behaviors or medicines that can trigger the condition. Contact a health care provider if you:  Feel light-headed or dizzy.  Almost faint.  Feel weak or are easily fatigued during physical activity.  Experience confusion or have memory problems. Get help right away if:  You faint.  You have: ? An irregular heartbeat (palpitations). ? Chest pain. ? Trouble breathing. Summary  Bradycardia is a slower-than-normal heartbeat. With bradycardia, the resting heart rate is less than 60 beats per minute.  Treatment for this condition depends on the cause.  Manage any health conditions that contribute to bradycardia as told by your health care provider.  Do not use any products that contain nicotine or tobacco, such as cigarettes, e-cigarettes, and chewing tobacco, and limit alcohol intake.  Keep all follow-up visits as told by your health care provider. This is important. This information is not intended to replace advice given to you by your health care provider. Make sure you discuss any questions you have with your health care provider. Document Released: 05/21/2002 Document Revised: 03/12/2018 Document Reviewed: 02/07/2018 Elsevier Patient Education  2020 [place] INFORMATION  Monitor your blood  pressure at home, please keep a record and bring that in with you to your next office visit.   Go to the ER if any CP, SOB, nausea, dizziness, severe HA, changes vision/speech  Testing/Procedures: HOW TO TAKE YOUR BLOOD PRESSURE:  Rest 5 minutes before taking your blood pressure.  Don't smoke or drink caffeinated beverages for at least 30 minutes before.  Take your blood pressure before (not after) you eat.  Sit comfortably with your back supported and both feet on the floor (don't cross your legs).  Elevate your arm to heart level on a table or a desk.  Use the proper sized cuff. It should fit smoothly and snugly around your bare upper arm. There should be enough room to slip a fingertip under the cuff. The bottom edge of the cuff should be 1 inch above the crease of the elbow.  Due to a recent study, SPRINT, we have changed our goal for the systolic or top blood pressure number. Ideally we want your top number at 120.  In the [facility] Trial, 5000 people were randomized to a goal BP of 120 and 5000 people were randomized to a goal BP of less than 140. The patients with the goal BP at 120 had LESS DEMENTIA, LESS HEART ATTACKS, AND LESS STROKES, AS WELL AS  OVERALL DECREASED MORTALITY OR DEATH RATE.   There was another study that showed taking your blood pressure medications at night decrease cardiovascular events.  However if you are on a fluid pill, please take this in the morning.   If you are willing, our goal BP is the top number of 120.  Your most recent BP: BP: 130/78   Take your medications faithfully as instructed. Maintain a healthy weight. Get at least 150 minutes of aerobic exercise per week. Minimize salt intake. Minimize alcohol intake  DASH Eating Plan DASH stands for "Dietary Approaches to Stop Hypertension." The DASH eating plan is a healthy eating plan that has been shown to reduce high blood pressure (hypertension). Additional health benefits may include reducing the  risk of type 2 diabetes mellitus, heart disease, and stroke. The DASH eating plan may also help with weight loss. WHAT DO I NEED TO KNOW ABOUT THE DASH EATING PLAN? For the DASH eating plan, you will follow these general guidelines:  Choose foods with a percent daily value for sodium of less than 5% (as listed on the food label).  Use salt-free seasonings or herbs instead of table salt or sea salt.  Check with your health care provider or pharmacist before using salt substitutes.  Eat lower-sodium products, often labeled as "lower sodium" or "no salt added."  Eat fresh foods.  Eat more vegetables, fruits, and low-fat dairy products.  Choose whole grains. Look for the word "whole" as the first word in the ingredient list.  Choose fish and skinless chicken or [place] more often than red meat. Limit fish, poultry, and meat to 6 oz (170 g) each day.  Limit sweets, desserts, sugars, and sugary drinks.  Choose heart-healthy fats.  Limit cheese to 1 oz (28 g) per day.  Eat more home-cooked food and less restaurant, buffet, and fast food.  Limit fried foods.  Cook foods using methods other than frying.  Limit canned vegetables. If you do use them, rinse them well to decrease the sodium.  When eating at a restaurant, ask that your food be prepared with less salt, or no salt if possible. WHAT FOODS CAN I EAT? Seek help from a dietitian for individual calorie needs. Grains Whole grain or whole wheat bread. Brown rice. Whole grain or whole wheat pasta. Quinoa, bulgur, and whole grain cereals. Low-sodium cereals. Corn or whole wheat flour tortillas. Whole grain cornbread. Whole grain crackers. Low-sodium crackers. Vegetables Fresh or frozen vegetables (raw, steamed, roasted, or grilled). Low-sodium or reduced-sodium tomato and vegetable juices. Low-sodium or reduced-sodium tomato sauce and paste. Low-sodium or reduced-sodium canned vegetables.  Fruits All fresh, canned (in natural juice),  or frozen fruits. Meat and Other Protein Products Ground beef (85% or leaner), grass-fed beef, or beef trimmed of fat. Skinless chicken or [place]. Ground chicken or [place]. Pork trimmed of fat. All fish and seafood. Eggs. Dried beans, peas, or lentils. Unsalted nuts and seeds. Unsalted canned beans. Dairy Low-fat dairy products, such as skim or 1% milk, 2% or reduced-fat cheeses, low-fat ricotta or cottage cheese, or plain low-fat yogurt. Low-sodium or reduced-sodium cheeses. Fats and Oils Tub margarines without trans fats. Light or reduced-fat mayonnaise and salad dressings (reduced sodium). Avocado. Safflower, olive, or canola oils. Natural peanut or almond butter. Other Unsalted popcorn and pretzels. The items listed above may not be a complete list of recommended foods or beverages. Contact your dietitian for more options. WHAT FOODS ARE NOT RECOMMENDED? Grains White bread. White pasta. White rice. Refined cornbread. Bagels  and croissants. Crackers that contain trans fat. Vegetables Creamed or fried vegetables. Vegetables in a cheese sauce. Regular canned vegetables. Regular canned tomato sauce and paste. Regular tomato and vegetable juices. Fruits Dried fruits. Canned fruit in light or heavy syrup. Fruit juice. Meat and Other Protein Products Fatty cuts of meat. Ribs, chicken wings, bacon, sausage, bologna, salami, chitterlings, fatback, hot dogs, bratwurst, and packaged luncheon meats. Salted nuts and seeds. Canned beans with salt. Dairy Whole or 2% milk, cream, half-and-half, and cream cheese. Whole-fat or sweetened yogurt. Full-fat cheeses or blue cheese. Nondairy creamers and whipped toppings. Processed cheese, cheese spreads, or cheese curds. Condiments Onion and garlic salt, seasoned salt, table salt, and sea salt. Canned and packaged gravies. Worcestershire sauce. Tartar sauce. Barbecue sauce. Teriyaki sauce. Soy sauce, including reduced sodium. Steak sauce. Fish sauce. Oyster  sauce. Cocktail sauce. Horseradish. Ketchup and mustard. Meat flavorings and tenderizers. Bouillon cubes. Hot sauce. Tabasco sauce. Marinades. Taco seasonings. Relishes. Fats and Oils Butter, stick margarine, lard, shortening, ghee, and bacon fat. Coconut, palm kernel, or palm oils. Regular salad dressings. Other Pickles and olives. Salted popcorn and pretzels. The items listed above may not be a complete list of foods and beverages to avoid. Contact your dietitian for more information. WHERE CAN I FIND MORE INFORMATION? National Heart, Lung, and Blood Institute: travelstabloid.com Document Released: 08/18/2011 Document Revised: 01/13/2014 Document Reviewed: 07/03/2013 [facility] Patient Information 2015 [place], [place]. This information is not intended to replace advice given to you by your health care provider. Make sure you discuss any questions you have with your health care provider.    Morton Neuralgia  Morton neuralgia is foot pain that affects the ball of the foot and the area near the toes. Morton neuralgia occurs when part of a nerve in the foot (digital nerve) is under too much pressure (compressed). When this happens over a long period of time, the nerve can thicken (neuroma) and cause pain. Pain usually occurs between the third and fourth toes.  Morton neuralgia can come and go but may get worse over time. What are the causes? This condition is caused by doing the same things over and over with your foot, such as:  Activities such as running or jumping.  Wearing shoes that are too tight. What increases the risk? You may be at higher risk for Morton neuralgia if you:  Wear high heels.  Wear shoes that are narrow or tight.  Do activities that repeatedly stretch your toes, such as: ? Running. ? [place]. ? Long-distance walking. What are the signs or symptoms? The first symptom of Morton neuralgia is pain that spreads from the ball of the foot  to the toes. It may feel like you are walking on a marble. Pain usually gets worse with walking and goes away at night. Other symptoms may include numbness and cramping of your toes. Both feet are equally affected, but rarely at the same time. How is this diagnosed? This condition is diagnosed based on your symptoms, your medical history, and a physical exam. Your health care provider may:  Squeeze your foot just behind your toe.  Ask you to move your toes to check for pain.  Ask about your physical activity level. You also may have imaging tests, such as an X-ray, ultrasound, or MRI. How is this treated? Treatment depends on how severe your condition is and what causes it. Treatment may involve:  Wearing different shoes that are not too tight, are low-heeled, and provide good support. For some people,  this is the only treatment needed.  Wearing an over-the-counter or custom supportive pad (orthotic) under the front of your foot.  Getting injections of numbing medicine and anti-inflammatory medicine (steroid) in the nerve.  Having surgery to remove part of the thickened nerve. Follow these instructions at home: Managing pain, stiffness, and swelling   Massage your foot as needed.  Wear orthotics as told by your health care provider.  If directed, put ice on your foot: ? Put ice in a plastic bag. ? Place a towel between your skin and the bag. ? Leave the ice on for 20 minutes, 2-3 times a day.  Avoid activities that cause pain or make pain worse. If you play sports, ask your health care provider when it is safe for you to return to sports.  Raise (elevate) your foot above the level of your heart while lying down and, when possible, while sitting. General instructions  Take over-the-counter and prescription medicines only as told by your health care provider.  Do not drive or use heavy machinery while taking prescription pain medicine.  Wear shoes that: ? Have soft  soles. ? Have a wide toe area. ? Provide arch support. ? Do not pinch or squeeze your feet. ? Have room for your orthotics, if applicable.  Keep all follow-up visits as told by your health care provider. This is important. Contact a health care provider if:  Your symptoms get worse or do not get better with treatment and home care. Summary  Morton neuralgia is foot pain that affects the ball of the foot and the area near the toes. Pain usually occurs between the third and fourth toes, gets worse with walking, and goes away at night.  Morton neuralgia occurs when part of a nerve in the foot (digital nerve) is under too much pressure. When this happens over a long period of time, the nerve can thicken (neuroma) and cause pain.  This condition is caused by doing the same things over and over with your foot, such as running or jumping, wearing shoes that are too tight, or wearing high heels.  Treatment may involve wearing low-heeled shoes that are not too tight, wearing a supportive pad (orthotic) under the front of your foot, getting injections in the nerve, or having surgery to remove part of the thickened nerve. This information is not intended to replace advice given to you by your health care provider. Make sure you discuss any questions you have with your health care provider. Document Released: 12/05/2000 Document Revised: 09/12/2017 Document Reviewed: 09/12/2017 Elsevier Patient Education  2020 [employer].

## 2019-04-02 LAB — CBC WITH DIFFERENTIAL/PLATELET
Absolute Monocytes: 329 cells/uL (ref 200–950)
Basophils Absolute: 19 cells/uL (ref 0–200)
Basophils Relative: 0.5 %
Eosinophils Absolute: 122 cells/uL (ref 15–500)
Eosinophils Relative: 3.3 %
HCT: 44.7 % (ref 38.5–50.0)
Hemoglobin: 15.2 g/dL (ref 13.2–17.1)
Lymphs Abs: 903 cells/uL (ref 850–3900)
MCH: 33 pg (ref 27.0–33.0)
MCHC: 34 g/dL (ref 32.0–36.0)
MCV: 97 fL (ref 80.0–100.0)
MPV: 11.2 fL (ref 7.5–12.5)
Monocytes Relative: 8.9 %
Neutro Abs: 2327 cells/uL (ref 1500–7800)
Neutrophils Relative %: 62.9 %
Platelets: 189 10*3/uL (ref 140–400)
RBC: 4.61 10*6/uL (ref 4.20–5.80)
RDW: 13 % (ref 11.0–15.0)
Total Lymphocyte: 24.4 %
WBC: 3.7 10*3/uL — ABNORMAL LOW (ref 3.8–10.8)

## 2019-04-02 LAB — COMPLETE METABOLIC PANEL WITH GFR
AG Ratio: 1.3 (calc) (ref 1.0–2.5)
ALT: 21 U/L (ref 9–46)
AST: 18 U/L (ref 10–35)
Albumin: 3.9 g/dL (ref 3.6–5.1)
Alkaline phosphatase (APISO): 68 U/L (ref 35–144)
BUN: 16 mg/dL (ref 7–25)
CO2: 26 mmol/L (ref 20–32)
Calcium: 9.1 mg/dL (ref 8.6–10.3)
Chloride: 105 mmol/L (ref 98–110)
Creat: 0.91 mg/dL (ref 0.70–1.25)
GFR, Est African American: 100 mL/min/{1.73_m2} (ref >=60)
GFR, Est Non African American: 86 mL/min/{1.73_m2} (ref >=60)
Globulin: 3 g/dL (calc) (ref 1.9–3.7)
Glucose, Bld: 90 mg/dL (ref 65–99)
Potassium: 4.4 mmol/L (ref 3.5–5.3)
Sodium: 137 mmol/L (ref 135–146)
Total Bilirubin: 0.6 mg/dL (ref 0.2–1.2)
Total Protein: 6.9 g/dL (ref 6.1–8.1)

## 2019-04-02 LAB — IRON, TOTAL/TOTAL IRON BINDING CAP
%SAT: 47 % (calc) (ref 20–48)
Iron: 123 ug/dL (ref 50–180)
TIBC: 259 mcg/dL (calc) (ref 250–425)

## 2019-04-02 LAB — LIPID PANEL
Cholesterol: 144 mg/dL (ref <200)
HDL: 39 mg/dL — ABNORMAL LOW (ref >=40)
LDL Cholesterol (Calc): 84 mg/dL (calc)
Non-HDL Cholesterol (Calc): 105 mg/dL (calc) (ref <130)
Total CHOL/HDL Ratio: 3.7 (calc) (ref <5.0)
Triglycerides: 110 mg/dL (ref <150)

## 2019-04-02 LAB — TSH: TSH: 3.05 mIU/L (ref 0.40–4.50)

## 2019-04-02 LAB — MAGNESIUM: Magnesium: 1.9 mg/dL (ref 1.5–2.5)

## 2019-04-02 LAB — VITAMIN B12: Vitamin B-12: 1835 pg/mL — ABNORMAL HIGH (ref 200–1100)

## 2019-04-02 LAB — VITAMIN D 25 HYDROXY (VIT D DEFICIENCY, FRACTURES): Vit D, 25-Hydroxy: 62 ng/mL (ref 30–100)

## 2019-04-18 ENCOUNTER — Other Ambulatory Visit: Payer: Self-pay | Admitting: Internal Medicine

## 2019-04-18 DIAGNOSIS — Z6825 Body mass index (BMI) 25.0-25.9, adult: Secondary | ICD-10-CM | POA: Diagnosis not present

## 2019-04-18 DIAGNOSIS — M961 Postlaminectomy syndrome, not elsewhere classified: Secondary | ICD-10-CM | POA: Diagnosis not present

## 2019-04-18 DIAGNOSIS — Q761 Klippel-Feil syndrome: Secondary | ICD-10-CM | POA: Diagnosis not present

## 2019-04-18 DIAGNOSIS — I10 Essential (primary) hypertension: Secondary | ICD-10-CM | POA: Diagnosis not present

## 2019-04-25 ENCOUNTER — Other Ambulatory Visit: Payer: Self-pay | Admitting: Internal Medicine

## 2019-04-25 DIAGNOSIS — M4802 Spinal stenosis, cervical region: Secondary | ICD-10-CM | POA: Diagnosis not present

## 2019-04-25 DIAGNOSIS — Q761 Klippel-Feil syndrome: Secondary | ICD-10-CM | POA: Diagnosis not present

## 2019-04-25 DIAGNOSIS — M47812 Spondylosis without myelopathy or radiculopathy, cervical region: Secondary | ICD-10-CM | POA: Diagnosis not present

## 2019-05-23 DIAGNOSIS — Q761 Klippel-Feil syndrome: Secondary | ICD-10-CM | POA: Diagnosis not present

## 2019-05-23 DIAGNOSIS — M961 Postlaminectomy syndrome, not elsewhere classified: Secondary | ICD-10-CM | POA: Insufficient documentation

## 2019-05-23 DIAGNOSIS — M5416 Radiculopathy, lumbar region: Secondary | ICD-10-CM | POA: Diagnosis not present

## 2019-05-23 DIAGNOSIS — I10 Essential (primary) hypertension: Secondary | ICD-10-CM | POA: Diagnosis not present

## 2019-05-29 ENCOUNTER — Other Ambulatory Visit: Payer: Self-pay

## 2019-05-29 DIAGNOSIS — Z20822 Contact with and (suspected) exposure to covid-19: Secondary | ICD-10-CM

## 2019-05-30 LAB — NOVEL CORONAVIRUS, NAA: SARS-CoV-2, NAA: NOT DETECTED

## 2019-06-03 ENCOUNTER — Telehealth: Payer: Self-pay | Admitting: Internal Medicine

## 2019-06-03 NOTE — Telephone Encounter (Signed)
°  Pt given neg COVID results

## 2019-06-09 ENCOUNTER — Other Ambulatory Visit: Payer: Self-pay | Admitting: Internal Medicine

## 2019-06-09 DIAGNOSIS — I10 Essential (primary) hypertension: Secondary | ICD-10-CM

## 2019-06-15 ENCOUNTER — Other Ambulatory Visit: Payer: Self-pay | Admitting: Physician Assistant

## 2019-06-15 DIAGNOSIS — R001 Bradycardia, unspecified: Secondary | ICD-10-CM

## 2019-06-15 DIAGNOSIS — I10 Essential (primary) hypertension: Secondary | ICD-10-CM

## 2019-07-02 DIAGNOSIS — M5416 Radiculopathy, lumbar region: Secondary | ICD-10-CM | POA: Diagnosis not present

## 2019-07-05 ENCOUNTER — Other Ambulatory Visit: Payer: Self-pay

## 2019-07-05 MED ORDER — OMEPRAZOLE 20 MG PO CPDR: DELAYED_RELEASE_CAPSULE | ORAL | 1 refills | Status: DC

## 2019-07-09 NOTE — Patient Instructions (Signed)

## 2019-07-09 NOTE — Progress Notes (Signed)
Comprehensive Evaluation & Examination     This very nice 68 y.o. MBM presents for a  comprehensive evaluation and management of multiple medical co-morbidities.  Patient has been followed for HTN, HLD, BPH/LUTS, Prediabetes and Vitamin D Deficiency. Patient has GERD controlled on his meds.     Patient has chronic Pain Syndrome consequent of Cx/Lumbar DDD and is s/p #2 Cx Disc surgeries and #4 LumbarDisk surgeries. He's followed  Dr Trenton Gammon at [facility] .      HTN predates circa 2013. Patient's BP has been controlled at home.  Today's BP is at goal - 120/78. Patient denies any cardiac symptoms as chest pain, palpitations, shortness of breath, dizziness or ankle swelling.     Patient's hyperlipidemia is controlled with diet and medications. Patient denies myalgias or other medication SE's. Last lipids were at goal:  Lab Results  Component Value Date   CHOL 144 04/01/2019   HDL 39 (L) 04/01/2019   LDLCALC 84 04/01/2019   TRIG 110 04/01/2019   CHOLHDL 3.7 04/01/2019      Patient has hx/o prediabetes (A1c 6.0% / 2014)and patient denies reactive hypoglycemic symptoms, visual blurring, diabetic polys or paresthesias. Last A1c was Normal & at goal:  Lab Results  Component Value Date   HGBA1C 5.6 12/27/2018        Patient is on parenteral Testosterone replacement reporting  sense of well being and stamina.           Finally, patient has history of Vitamin D Deficiency and last vitamin D was at goal:  Lab Results  Component Value Date   VD25OH 62 04/01/2019   Current Outpatient Medications on File Prior to Visit  Medication Sig  . atorvastatin (LIPITOR) 80 MG tablet TAKE 1 TABLET (80 MG TOTAL) BY MOUTH DAILY. FOR CHOLESTEROL  . bisoprolol (ZEBETA) 10 MG tablet Take 1 tablet Daily for BP  . Cholecalciferol (VITAMIN D) 2000 UNITS tablet Take 4,000 Units by mouth daily.   . diclofenac (VOLTAREN) 75 MG EC tablet TAKE 1 TABLET BY MOUTH TWICE A DAY  .  finasteride (PROSCAR) 5 MG tablet Take 1 tablet Daily for Prostate  . Flaxseed, Linseed, (FLAX SEED OIL) 1000 MG CAPS Take 1 capsule (1,000 mg total) by mouth 2 (two) times daily.  . fluticasone (FLONASE) 50 MCG/ACT nasal spray SPRAY 2 SPRAYS INTO EACH NOSTRIL EVERY DAY  . loratadine (CLARITIN) 10 MG tablet Take 1 tablet (10 mg total) by mouth as needed for allergies.  . Magnesium 250 MG TABS Take 250 mg by mouth daily.  . Needles & Syringes MISC Use for Testosterone inject every 2 weeks.  Marland Kitchen olmesartan (BENICAR) 20 MG tablet Take 1 tablet Daily for BP (Dx I10)  . omeprazole (PRILOSEC) 20 MG capsule Take 1 to 2 capsules /day as needed for Indigestion & acid Reflux (Patient taking differently: Take 1 capsule twice a day as needed for Indigestion & acid Reflux)  . oxyCODONE (OXY IR/ROXICODONE) 5 MG immediate release tablet Take 5 mg by mouth every 4 (four) hours as needed for severe pain.  . tamsulosin (FLOMAX) 0.4 MG CAPS capsule TAKE 2 CAPSULES DAILY FOR PROSTATE  . testosterone cypionate (DEPOTESTOSTERONE CYPIONATE) 200 MG/ML injection Inject 2 mls into muscle every 2 weeks  . vitamin B-12 (CYANOCOBALAMIN) 1000 MCG tablet Take 1,000 mcg by mouth daily.  . sildenafil (VIAGRA) 100 MG tablet Take 1 tablet (100 mg total) by mouth as needed for erectile dysfunction.   No current facility-administered medications on  file prior to visit.    Allergies  Allergen Reactions  . Other Hives    Beef-hives  RED MEAT  . Aspirin Hives and Rash  . Salicylates Rash   Past Medical History:  Diagnosis Date  . Allergy   . Anxiety    takes Valium daily as needed  . Arthritis    in neck  . BPH (benign prostatic hyperplasia)    takes Flomax daily  . Chronic back pain    DDD  . COPD (chronic obstructive pulmonary disease) ([place])   . Diverticulosis   . Foot drop    both feet  . GERD (gastroesophageal reflux disease)    takes Omeprazole daily as well as Zantac  . Hyperlipidemia    takes Atorvastatin  daily  . Hypertension   . Hypogonadism male   . Nocturia   . Numbness    only in 4th and 5th finger on left hand and left leg  . Seasonal allergies    takes Claritin daily as needed and uses Flonase daily as needed  . Urinary frequency    takes Flomax daily   Health Maintenance  Topic Date Due  . INFLUENZA VACCINE  04/13/2019  . TETANUS/TDAP  05/27/2024  . COLONOSCOPY  10/08/2028  . Hepatitis C Screening  Completed  . PNA vac Low Risk Adult  Completed   Immunization History  Administered Date(s) Administered  . Influenza Split 06/18/2015  . Influenza, High Dose Seasonal PF 06/20/2016, 06/06/2017, 07/31/2018  . Influenza-Unspecified 08/17/2014  . PPD Test 05/27/2014  . Pneumococcal Conjugate-13 12/09/2015  . Pneumococcal Polysaccharide-23 04/08/2013, 06/06/2017  . Td 09/13/2003  . Tdap 05/27/2014  . Zoster Recombinat (Shingrix) 11/29/2017   Last Colon - 10/08/2018 - Dr Loletha Carrow  -Diverticulosis - Recc no f/u due to age   Past Surgical History:  Procedure Laterality Date  . BACK SURGERY  2016 x 2 L3-L5, 12/2016 S1   lumbar fusion x 3  . CERVICAL FUSION  2011/2012  . COLONOSCOPY    . KNEE ARTHROSCOPY     right 1985; left 1992  . SEPTOPLASTY    . SHOULDER ARTHROSCOPY Left 2015  . SHOULDER ARTHROSCOPY WITH ROTATOR CUFF REPAIR Right 08/18/2017   Dr. Onnie Graham  . SHOULDER SURGERY Right 08   Family History  Problem Relation Age of Onset  . Leukemia Mother   . Diabetes Mother   . Hypertension Father   . Diabetes Father   . Colon cancer Neg Hx   . Stomach cancer Neg Hx   . Esophageal cancer Neg Hx   . Rectal cancer Neg Hx    Social History   Socioeconomic History  . Marital status: Married    Spouse name: Pricilla Holm   . Number of children: 1 son   Occupational History  . Retired Emergency planning/management officer  Tobacco Use  . Smoking status: Former Smoker    Quit date: 05/28/1995    Years since quitting: 24.1  . Smokeless tobacco: Never Used  . Tobacco comment: quit smoking >20+yrs  ago  Substance and Sexual Activity  . Alcohol use: Not Currently    Alcohol/week: 0.0 standard drinks    Comment: rarely  . Drug use: No  . Sexual activity: Yes    ROS Constitutional: Denies fever, chills, weight loss/gain, headaches, insomnia,  night sweats or change in appetite. Does c/o fatigue. Eyes: Denies redness, blurred vision, diplopia, discharge, itchy or watery eyes.  ENT: Denies discharge, congestion, post nasal drip, epistaxis, sore throat, earache, hearing loss, dental pain,  Tinnitus, Vertigo, Sinus pain or snoring.  Cardio: Denies chest pain, palpitations, irregular heartbeat, syncope, dyspnea, diaphoresis, orthopnea, PND, claudication or edema Respiratory: denies cough, dyspnea, DOE, pleurisy, hoarseness, laryngitis or wheezing.  Gastrointestinal: Denies dysphagia, heartburn, reflux, water brash, pain, cramps, nausea, vomiting, bloating, diarrhea, constipation, hematemesis, melena, hematochezia, jaundice or hemorrhoids Genitourinary: Denies dysuria, frequency,discharge, hematuria or flank pain. Has urgency, nocturia x 2-3 & occasional hesitancy. Musculoskeletal: Denies arthralgia, myalgia, stiffness, Jt. Swelling, pain, limp or strain/sprain. Denies Falls. Skin: Denies puritis, rash, hives, warts, acne, eczema or change in skin lesion Neuro: No weakness, tremor, incoordination, spasms, paresthesia or pain Psychiatric: Denies confusion, memory loss or sensory loss. Denies Depression. Endocrine: Denies change in weight, skin, hair change, nocturia, and paresthesia, diabetic polys, visual blurring or hyper / hypo glycemic episodes.  Heme/Lymph: No excessive bleeding, bruising or enlarged lymph nodes.  Physical Exam  BP 120/78   Pulse 79   Temp 97.7 F (36.5 C)   Ht 6' 0.5" (1.842 m)   Wt 202 lb (91.6 kg)   SpO2 97%   BMI 27.02 kg/m   General Appearance: Well nourished and well groomed and in no apparent distress.  Eyes: PERRLA, EOMs, conjunctiva no swelling or  erythema, normal fundi and vessels. Sinuses: No frontal/maxillary tenderness ENT/Mouth: EACs patent / TMs  nl. Nares clear without erythema, swelling, mucoid exudates. Oral hygiene is good. No erythema, swelling, or exudate. Tongue normal, non-obstructing. Tonsils not swollen or erythematous. Hearing normal.  Neck: Supple, thyroid not palpable. No bruits, nodes or JVD. Respiratory: Respiratory effort normal.  BS equal and clear bilateral without rales, rhonci, wheezing or stridor. Cardio: Heart sounds are normal with regular rate and rhythm and no murmurs, rubs or gallops. Peripheral pulses are normal and equal bilaterally without edema. No aortic or femoral bruits. Chest: symmetric with normal excursions and percussion.  Abdomen: Soft, with Nl bowel sounds. Nontender, no guarding, rebound, hernias, masses, or organomegaly.  Lymphatics: Non tender without lymphadenopathy.  Musculoskeletal: Full ROM all peripheral extremities, joint stability, 5/5 strength, and normal gait. Skin: Warm and dry without rashes, lesions, cyanosis, clubbing or  ecchymosis.  Neuro: Cranial nerves intact, reflexes equal bilaterally. Normal muscle tone, no cerebellar symptoms. Sensation intact.  Pysch: Alert and oriented X 3 with normal affect, insight and judgment appropriate.   Assessment and Plan  1. Essential hypertension  - EKG 12-Lead - [place], RETROPERITNL ABD,  LTD - Urinalysis, Routine w reflex microscopic - Microalbumin / creatinine urine ratio - CBC with Differential/Platelet - COMPLETE METABOLIC PANEL WITH GFR - Magnesium - TSH - olmesartan  40 MG tablet; Take 1 tablet Daily for BP (Dx I10)  Disp: 90 tablet; Refill: 3  2. Hyperlipidemia, mixed  - EKG 12-Lead - [place], RETROPERITNL ABD,  LTD - Lipid panel - TSH  3. Abnormal glucose  - EKG 12-Lead - [place], RETROPERITNL ABD,  LTD - Hemoglobin A1c - Insulin, random  4. Vitamin D deficiency  - VITAMIN D 25 Hydroxyl  5. Chronic pain syndrome  -  Hemoglobin A1c - Insulin, random - pregabalin 100 MG; Take 1 capsule 3 to 4 x /day for Chronic Pain  Disp: 360 cap; Refill: 1  6. Prediabetes  - EKG 12-Lead - [place], RETROPERITNL ABD,  LTD  7. Testosterone deficiency  - Testosterone  8. Gastroesophageal reflux disease without esophagitis  - CBC with Differential/Platelet  9. Screening for colorectal cancer  - POC Hemoccult Bld/Stl  10. BPH with obstruction/lower urinary tract symptoms  - PSA  11. Prostate cancer screening  -  PSA  12. Screening for ischemic heart disease  - EKG 12-Lead  13. FH: hypertension  - EKG 12-Lead - [place], RETROPERITNL ABD,  LTD  14. Former smoker  - EKG 12-Lead - [place], RETROPERITNL ABD,  LTD  15. Screening for AAA (aortic abdominal aneurysm)  - [place], RETROPERITNL ABD,  LTD  16. Medication management  - Urinalysis, Routine w reflex microscopic - Microalbumin / creatinine urine ratio - Testosterone - CBC with Differential/Platelet - COMPLETE METABOLIC PANEL WITH GFR - Magnesium - Lipid panel - TSH - Hemoglobin A1c - Insulin, random - VITAMIN D 25 Hydroxyl        Patient was counseled in prudent diet, weight control to achieve/maintain BMI less than 25, BP monitoring, regular exercise and medications as discussed.  Discussed med effects and SE's. Routine screening labs and tests as requested with regular follow-up as recommended. Over 40 minutes of exam, counseling, chart review and high complex critical decision making was performed   Kirtland Bouchard, MD

## 2019-07-10 ENCOUNTER — Other Ambulatory Visit: Payer: Self-pay

## 2019-07-10 ENCOUNTER — Encounter: Payer: Self-pay | Admitting: Internal Medicine

## 2019-07-10 ENCOUNTER — Ambulatory Visit (INDEPENDENT_AMBULATORY_CARE_PROVIDER_SITE_OTHER): Payer: Medicare Other | Admitting: Internal Medicine

## 2019-07-10 VITALS — BP 120/78 | HR 79 | Temp 97.7°F | Ht 72.5 in | Wt 202.0 lb

## 2019-07-10 DIAGNOSIS — Z79899 Other long term (current) drug therapy: Secondary | ICD-10-CM | POA: Diagnosis not present

## 2019-07-10 DIAGNOSIS — N138 Other obstructive and reflux uropathy: Secondary | ICD-10-CM

## 2019-07-10 DIAGNOSIS — R7303 Prediabetes: Secondary | ICD-10-CM

## 2019-07-10 DIAGNOSIS — R001 Bradycardia, unspecified: Secondary | ICD-10-CM

## 2019-07-10 DIAGNOSIS — Z136 Encounter for screening for cardiovascular disorders: Secondary | ICD-10-CM

## 2019-07-10 DIAGNOSIS — G894 Chronic pain syndrome: Secondary | ICD-10-CM

## 2019-07-10 DIAGNOSIS — E782 Mixed hyperlipidemia: Secondary | ICD-10-CM | POA: Diagnosis not present

## 2019-07-10 DIAGNOSIS — I10 Essential (primary) hypertension: Secondary | ICD-10-CM | POA: Diagnosis not present

## 2019-07-10 DIAGNOSIS — E349 Endocrine disorder, unspecified: Secondary | ICD-10-CM | POA: Diagnosis not present

## 2019-07-10 DIAGNOSIS — Z8249 Family history of ischemic heart disease and other diseases of the circulatory system: Secondary | ICD-10-CM | POA: Diagnosis not present

## 2019-07-10 DIAGNOSIS — E559 Vitamin D deficiency, unspecified: Secondary | ICD-10-CM

## 2019-07-10 DIAGNOSIS — R7309 Other abnormal glucose: Secondary | ICD-10-CM

## 2019-07-10 DIAGNOSIS — Z125 Encounter for screening for malignant neoplasm of prostate: Secondary | ICD-10-CM | POA: Diagnosis not present

## 2019-07-10 DIAGNOSIS — N401 Enlarged prostate with lower urinary tract symptoms: Secondary | ICD-10-CM | POA: Diagnosis not present

## 2019-07-10 DIAGNOSIS — K219 Gastro-esophageal reflux disease without esophagitis: Secondary | ICD-10-CM

## 2019-07-10 DIAGNOSIS — Z1212 Encounter for screening for malignant neoplasm of rectum: Secondary | ICD-10-CM

## 2019-07-10 DIAGNOSIS — Z87891 Personal history of nicotine dependence: Secondary | ICD-10-CM | POA: Diagnosis not present

## 2019-07-10 DIAGNOSIS — Z1211 Encounter for screening for malignant neoplasm of colon: Secondary | ICD-10-CM

## 2019-07-10 MED ORDER — PREGABALIN 100 MG PO CAPS: ORAL_CAPSULE | ORAL | 1 refills | Status: DC

## 2019-07-10 MED ORDER — NEEDLES & SYRINGES MISC: 3 refills | Status: DC

## 2019-07-10 MED ORDER — OLMESARTAN MEDOXOMIL 40 MG PO TABS: ORAL_TABLET | ORAL | 3 refills | Status: DC

## 2019-07-11 LAB — COMPLETE METABOLIC PANEL WITH GFR
AG Ratio: 1.4 (calc) (ref 1.0–2.5)
ALT: 25 U/L (ref 9–46)
AST: 19 U/L (ref 10–35)
Albumin: 4 g/dL (ref 3.6–5.1)
Alkaline phosphatase (APISO): 71 U/L (ref 35–144)
BUN: 11 mg/dL (ref 7–25)
CO2: 28 mmol/L (ref 20–32)
Calcium: 9.2 mg/dL (ref 8.6–10.3)
Chloride: 103 mmol/L (ref 98–110)
Creat: 1.02 mg/dL (ref 0.70–1.25)
GFR, Est African American: 87 mL/min/{1.73_m2} (ref >=60)
GFR, Est Non African American: 75 mL/min/{1.73_m2} (ref >=60)
Globulin: 2.8 g/dL (calc) (ref 1.9–3.7)
Glucose, Bld: 86 mg/dL (ref 65–99)
Potassium: 4.2 mmol/L (ref 3.5–5.3)
Sodium: 138 mmol/L (ref 135–146)
Total Bilirubin: 0.4 mg/dL (ref 0.2–1.2)
Total Protein: 6.8 g/dL (ref 6.1–8.1)

## 2019-07-11 LAB — URINALYSIS, ROUTINE W REFLEX MICROSCOPIC
Bilirubin Urine: NEGATIVE
Glucose, UA: NEGATIVE
Hgb urine dipstick: NEGATIVE
Ketones, ur: NEGATIVE
Leukocytes,Ua: NEGATIVE
Nitrite: NEGATIVE
Protein, ur: NEGATIVE
Specific Gravity, Urine: 1.011 (ref 1.001–1.03)
pH: 7.5 (ref 5.0–8.0)

## 2019-07-11 LAB — VITAMIN D 25 HYDROXY (VIT D DEFICIENCY, FRACTURES): Vit D, 25-Hydroxy: 57 ng/mL (ref 30–100)

## 2019-07-11 LAB — CBC WITH DIFFERENTIAL/PLATELET
Absolute Monocytes: 406 cells/uL (ref 200–950)
Basophils Absolute: 20 cells/uL (ref 0–200)
Basophils Relative: 0.5 %
Eosinophils Absolute: 168 cells/uL (ref 15–500)
Eosinophils Relative: 4.3 %
HCT: 42.6 % (ref 38.5–50.0)
Hemoglobin: 14.7 g/dL (ref 13.2–17.1)
Lymphs Abs: 1158 cells/uL (ref 850–3900)
MCH: 33 pg (ref 27.0–33.0)
MCHC: 34.5 g/dL (ref 32.0–36.0)
MCV: 95.7 fL (ref 80.0–100.0)
MPV: 10.9 fL (ref 7.5–12.5)
Monocytes Relative: 10.4 %
Neutro Abs: 2149 cells/uL (ref 1500–7800)
Neutrophils Relative %: 55.1 %
Platelets: 215 10*3/uL (ref 140–400)
RBC: 4.45 10*6/uL (ref 4.20–5.80)
RDW: 13.1 % (ref 11.0–15.0)
Total Lymphocyte: 29.7 %
WBC: 3.9 10*3/uL (ref 3.8–10.8)

## 2019-07-11 LAB — MICROALBUMIN / CREATININE URINE RATIO
Creatinine, Urine: 73 mg/dL (ref 20–320)
Microalb Creat Ratio: 4 mcg/mg creat (ref <30)
Microalb, Ur: 0.3 mg/dL

## 2019-07-11 LAB — LIPID PANEL
Cholesterol: 147 mg/dL (ref <200)
HDL: 44 mg/dL (ref >=40)
LDL Cholesterol (Calc): 77 mg/dL (calc)
Non-HDL Cholesterol (Calc): 103 mg/dL (calc) (ref <130)
Total CHOL/HDL Ratio: 3.3 (calc) (ref <5.0)
Triglycerides: 162 mg/dL — ABNORMAL HIGH (ref <150)

## 2019-07-11 LAB — PSA: PSA: 1.8 ng/mL (ref <=4.0)

## 2019-07-11 LAB — HEMOGLOBIN A1C
Hgb A1c MFr Bld: 5.5 % of total Hgb (ref <5.7)
Mean Plasma Glucose: 111 (calc)
eAG (mmol/L): 6.2 (calc)

## 2019-07-11 LAB — MAGNESIUM: Magnesium: 2.1 mg/dL (ref 1.5–2.5)

## 2019-07-11 LAB — TESTOSTERONE: Testosterone: 804 ng/dL (ref 250–827)

## 2019-07-11 LAB — TSH: TSH: 4.34 mIU/L (ref 0.40–4.50)

## 2019-07-11 LAB — INSULIN, RANDOM: Insulin: 10.8 u[IU]/mL

## 2019-07-13 ENCOUNTER — Other Ambulatory Visit: Payer: Self-pay | Admitting: Physician Assistant

## 2019-07-13 ENCOUNTER — Encounter: Payer: Self-pay | Admitting: Internal Medicine

## 2019-07-13 DIAGNOSIS — N320 Bladder-neck obstruction: Secondary | ICD-10-CM

## 2019-07-17 ENCOUNTER — Other Ambulatory Visit: Payer: Self-pay | Admitting: Internal Medicine

## 2019-07-17 DIAGNOSIS — N401 Enlarged prostate with lower urinary tract symptoms: Secondary | ICD-10-CM | POA: Diagnosis not present

## 2019-07-23 DIAGNOSIS — N401 Enlarged prostate with lower urinary tract symptoms: Secondary | ICD-10-CM | POA: Diagnosis not present

## 2019-07-23 DIAGNOSIS — R351 Nocturia: Secondary | ICD-10-CM | POA: Diagnosis not present

## 2019-07-31 DIAGNOSIS — I10 Essential (primary) hypertension: Secondary | ICD-10-CM | POA: Diagnosis not present

## 2019-07-31 DIAGNOSIS — Z6826 Body mass index (BMI) 26.0-26.9, adult: Secondary | ICD-10-CM | POA: Diagnosis not present

## 2019-08-11 ENCOUNTER — Other Ambulatory Visit: Payer: Self-pay | Admitting: Internal Medicine

## 2019-08-20 DIAGNOSIS — R3911 Hesitancy of micturition: Secondary | ICD-10-CM | POA: Diagnosis not present

## 2019-08-20 DIAGNOSIS — R351 Nocturia: Secondary | ICD-10-CM | POA: Diagnosis not present

## 2019-08-20 DIAGNOSIS — N401 Enlarged prostate with lower urinary tract symptoms: Secondary | ICD-10-CM | POA: Diagnosis not present

## 2019-08-20 DIAGNOSIS — R3912 Poor urinary stream: Secondary | ICD-10-CM | POA: Diagnosis not present

## 2019-08-20 DIAGNOSIS — R350 Frequency of micturition: Secondary | ICD-10-CM | POA: Diagnosis not present

## 2019-08-20 DIAGNOSIS — N5201 Erectile dysfunction due to arterial insufficiency: Secondary | ICD-10-CM | POA: Diagnosis not present

## 2019-08-28 ENCOUNTER — Ambulatory Visit: Payer: Self-pay | Admitting: Adult Health

## 2019-09-25 DIAGNOSIS — Z20822 Contact with and (suspected) exposure to covid-19: Secondary | ICD-10-CM | POA: Diagnosis not present

## 2019-09-30 DIAGNOSIS — M5416 Radiculopathy, lumbar region: Secondary | ICD-10-CM | POA: Diagnosis not present

## 2019-09-30 DIAGNOSIS — M961 Postlaminectomy syndrome, not elsewhere classified: Secondary | ICD-10-CM | POA: Diagnosis not present

## 2019-10-01 DIAGNOSIS — M5416 Radiculopathy, lumbar region: Secondary | ICD-10-CM | POA: Diagnosis not present

## 2019-10-07 DIAGNOSIS — R3912 Poor urinary stream: Secondary | ICD-10-CM | POA: Diagnosis not present

## 2019-10-07 DIAGNOSIS — R3911 Hesitancy of micturition: Secondary | ICD-10-CM | POA: Diagnosis not present

## 2019-10-07 DIAGNOSIS — N401 Enlarged prostate with lower urinary tract symptoms: Secondary | ICD-10-CM | POA: Diagnosis not present

## 2019-10-07 HISTORY — PX: PROSTATE SURGERY: SHX751

## 2019-10-09 DIAGNOSIS — I700 Atherosclerosis of aorta: Secondary | ICD-10-CM | POA: Insufficient documentation

## 2019-10-09 NOTE — Progress Notes (Signed)
Patient ID: Jacob Larsen, male   DOB: [birth year], 69 y.o.   MRN: [id number]  MEDICARE ANNUAL WELLNESS VISIT AND FOLLOW UP Assessment:   Encounter for annual medicare wellness visit  Atherosclerosis of aorta Per CT 05/2015 Control blood pressure, cholesterol, glucose, increase exercise.   Essential hypertension - continue medications, DASH diet, exercise and monitor at home. Call if greater than 130/80.  -     CBC with Differential/Platelet -     CMP/GFR -     TSH  Chronic obstructive pulmonary disease, unspecified COPD type ([place]) Avoid triggers, monitor. Per CXR 2018.   Reflux esophagitis Continue PPI/H2 blocker, diet discussed  Lumbar degenerative disc disease Continue follow up  Mixed hyperlipidemia -     Lipid panel -continue medications, check lipids, decrease fatty foods, increase activity.   Abnormal glucose Recent A1Cs at goal Discussed diet/exercise, weight management  Defer A1C; check CMP  Medication management -     Magnesium  Vitamin D deficiency Continue supplement  Testosterone deficiency - continue replacement therapy, check testosterone levels as needed.   S/P lumbar spinal fusion Continue follow up  Diverticulosis of large intestine without hemorrhage Increase fiber, no symptoms at this time UTD colonoscopy next due 09/2028  Impotence of organic origin Continue follow up, current medications via urology   BPH with nocturia Continue medications; managed by [facility]  Spondylolisthesis of lumbar region Continue follow up  Overweight Long discussion about weight loss, diet, and exercise Recommended diet heavy in fruits and veggies and low in animal meats, cheeses, and dairy products, appropriate calorie intake Discussed appropriate weight for height  and initial goal (<200) Follow up at next visit   Over 30 minutes of exam, counseling, chart review, and critical decision making was performed Future Appointments  Date Time Provider  [place]  01/16/2020 10:30 AM Unk Pinto, MD [facility] None  08/10/2020 10:00 AM Unk Pinto, MD [facility] None    Plan:   During the course of the visit the patient was educated and counseled about appropriate screening and preventive services including:    Pneumococcal vaccine   Influenza vaccine  Prevnar 13  Td vaccine  Screening electrocardiogram  Colorectal cancer screening  Diabetes screening  Glaucoma screening  Nutrition counseling    Subjective:  Jacob Larsen is a 69 y.o. AA male who presents for Medicare Annual Wellness Visit and 3 month follow up for HTN, hyperlipidemia, prediabetes, and vitamin D Def.   He had rezium water vapor therapy procedure for BPH at [facility] by Dr. Gloriann Loan on 10/07/2019, still has foley will return to have removed tomorrow.   He is managed by Dr. Mallie Mussel Pool/Dr. Luan Pulling for chronic lumbar pain, s/p cervical fusion in 2011 and 2012, and lumbar fusion x 3 in 2016 x 2 and in 2018 - S1 - L3, and is prescribed pain medication through that clinic. Prescribed lyria 100 mg and oxy 5 mg IR QID PRN (recently estimates 2 tabs/day), had steroid injection on 1/19 and feeling better.   Remote former smoker. He has COPD by imaging, asymptomatic off of inhalers.   BMI is Body mass index is 27.37 kg/m., he has been working on diet, admits no intentional exercise but generally active around the house for 50% of the day, when back allows.  Wt Readings from Last 3 Encounters:  10/10/19 204 lb 9.6 oz (92.8 kg)  07/10/19 202 lb (91.6 kg)  04/01/19 198 lb 12.8 oz (90.2 kg)   He has aortic atherosclerosis per CT 05/2015.  His blood pressure has been controlled at home, today their BP is BP: 118/82 He does not workout. He denies chest pain, shortness of breath, dizziness.    He is on cholesterol medication (atorvastatin 40 mg three days a week) and denies myalgias. His cholesterol is at goal. The cholesterol last visit was:   Lab  Results  Component Value Date   CHOL 147 07/10/2019   HDL 44 07/10/2019   LDLCALC 77 07/10/2019   TRIG 162 (H) 07/10/2019   CHOLHDL 3.3 07/10/2019   He has been working on diet and exercise for glucose management, and denies foot ulcerations, hyperglycemia, hypoglycemia , increased appetite, nausea, polydipsia, polyuria, visual disturbances, vomiting and weight loss. Last A1C in the office was:  Lab Results  Component Value Date   HGBA1C 5.5 07/10/2019   Last GFR Lab Results  Component Value Date   GFRAA 87 07/10/2019   Patient is on Vitamin D supplement.   Lab Results  Component Value Date   VD25OH 57 07/10/2019     He has a history of testosterone deficiency and is on testosterone replacement, last shot was yesterday, he injects 400 mg at home q 20 days. He states that the testosterone helps with his energy, libido, muscle mass. Lab Results  Component Value Date   TESTOSTERONE 804 07/10/2019   He has ED and prescribed sildenafil 20 mg PRN which does help.   He has BPH with nocturia followed by Dr. Karsten Ro, on finasteride and tamsulosin.   Medication Review: Current Outpatient Medications on File Prior to Visit  Medication Sig Dispense Refill  . atorvastatin (LIPITOR) 80 MG tablet TAKE 1 TABLET (80 MG TOTAL) BY MOUTH DAILY. FOR CHOLESTEROL 90 tablet 0  . bisoprolol (ZEBETA) 10 MG tablet Take 1 tablet Daily for BP 90 tablet 3  . Cholecalciferol (VITAMIN D) 2000 UNITS tablet Take 4,000 Units by mouth daily.     . diclofenac (VOLTAREN) 75 MG EC tablet TAKE 1 TABLET BY MOUTH TWICE A DAY 180 tablet 1  . finasteride (PROSCAR) 5 MG tablet Take 1 tablet Daily for Prostate 90 tablet 3  . Flaxseed, Linseed, (FLAX SEED OIL) 1000 MG CAPS Take 1 capsule (1,000 mg total) by mouth 2 (two) times daily.  0  . fluticasone (FLONASE) 50 MCG/ACT nasal spray SPRAY 2 SPRAYS INTO EACH NOSTRIL EVERY DAY 48 mL 1  . loratadine (CLARITIN) 10 MG tablet Take 1 tablet (10 mg total) by mouth as needed for  allergies. 90 tablet 4  . Magnesium 250 MG TABS Take 250 mg by mouth daily.    . Needles & Syringes MISC Use for Testosterone inject every 2 weeks. 50 each 3  . olmesartan (BENICAR) 40 MG tablet Take 1 tablet Daily for BP (Dx I10) 90 tablet 3  . omeprazole (PRILOSEC) 20 MG capsule Take 1 capsule 2 x /day as needed for Indigestion &  Acid Reflux 180 capsule 3  . oxyCODONE (OXY IR/ROXICODONE) 5 MG immediate release tablet Take 5 mg by mouth every 4 (four) hours as needed for severe pain.    . pregabalin (LYRICA) 100 MG capsule Take 1 capsule 3 to 4 x /day for Chronic Pain 360 capsule 1  . sildenafil (VIAGRA) 100 MG tablet Take 1 tablet (100 mg total) by mouth as needed for erectile dysfunction. 30 tablet 1  . tamsulosin (FLOMAX) 0.4 MG CAPS capsule Take 2 capsules at Bedtime for Prostate 180 capsule 1  . testosterone cypionate (DEPOTESTOSTERONE CYPIONATE) 200 MG/ML injection INJECT 2 MLS INTO  MUSCLE EVERY 2 WEEKS 10 mL 2  . vitamin B-12 (CYANOCOBALAMIN) 1000 MCG tablet Take 1,000 mcg by mouth daily.     No current facility-administered medications on file prior to visit.    Current Problems (verified) Patient Active Problem List   Diagnosis Date Noted  . Benign prostatic hyperplasia with nocturia 10/10/2019  . Aortic atherosclerosis ([place]) 10/09/2019  . Gastroesophageal reflux disease without esophagitis 12/26/2018  . Prediabetes 12/26/2018  . Overweight (BMI 25.0-29.9) 01/23/2018  . COPD (chronic obstructive pulmonary disease) ([place]) 04/03/2017  . S/P lumbar spinal fusion 12/27/2016  . Medication management 09/09/2015  . Spondylolisthesis of lumbar region 04/28/2015  . Testosterone deficiency 11/06/2014  . Lumbar degenerative disc disease 09/23/2014  . Essential hypertension 08/13/2013  . Hyperlipidemia, mixed 08/13/2013  . Abnormal glucose 08/13/2013  . Reflux esophagitis 08/13/2013  . Vitamin D deficiency 08/13/2013  . Impotence of organic origin 08/13/2013  . Diverticulosis of  large intestine 04/01/2008    Screening Tests Immunization History  Administered Date(s) Administered  . Influenza Split 06/18/2015  . Influenza, High Dose Seasonal PF 06/20/2016, 06/06/2017, 07/31/2018  . Influenza-Unspecified 08/17/2014, 07/10/2019  . PPD Test 05/27/2014  . Pneumococcal Conjugate-13 12/09/2015  . Pneumococcal Polysaccharide-23 04/08/2013, 06/06/2017  . Td 09/13/2003  . Tdap 05/27/2014  . Zoster Recombinat (Shingrix) 11/29/2017    Preventative care: Last colonoscopy: 09/2018, due 10 years CT AB 2016 CXR 03/2017 Echo 2010  Prior vaccinations: TD or Tdap: 2015  Influenza: 06/2019 Pneumococcal: 2014, 2018 Prevnar13: 2017 Shingles/Zostavax: Declined  Names of Other Physician/Practitioners you currently use: 1. [facility] here for primary care 2. Dr. Sabra Heck, eye doctor, last visit June 2019, every 2 years 3. Dr. Rona Ravens, dentist, last visit 2020, goes q16m  Patient Care Team: Unk Pinto, MD as PCP - General (Internal Medicine) Inda Castle, MD (Inactive) as Consulting Physician (Gastroenterology) Kathie Rhodes, MD as Consulting Physician (Urology) Barbaraann Cao, OD as Referring Physician (Optometry) Druscilla Brownie, MD as Consulting Physician (Dermatology) Earnie Larsson, MD as Consulting Physician (Neurosurgery)  Allergies Allergies  Allergen Reactions  . Other Hives    Beef-hives  RED MEAT  . Aspirin Hives and Rash  . Salicylates Rash    SURGICAL HISTORY He  has a past surgical history that includes Cervical fusion (2011/2012); Knee arthroscopy; Septoplasty; Shoulder arthroscopy (Left, 2015); Shoulder surgery (Right, 08); Colonoscopy; Back surgery (2016 x 2 L3-L5, 12/2016 S1); and Shoulder arthroscopy with rotator cuff repair (Right, 08/18/2017). FAMILY HISTORY His family history includes Diabetes in his father and mother; Hypertension in his father; Leukemia in his mother. SOCIAL HISTORY He   reports that he quit smoking about 24 years ago. He has never used smokeless tobacco. He reports previous alcohol use. He reports that he does not use drugs.  MEDICARE WELLNESS OBJECTIVES: Physical activity: Current Exercise Habits: The patient does not participate in regular exercise at present, Exercise limited by: orthopedic condition(s) Cardiac risk factors: Cardiac Risk Factors include: advanced age (>2men, >69 women);dyslipidemia;hypertension;male gender;smoking/ tobacco exposure;sedentary lifestyle Depression/mood screen:   Depression screen [facility] 2/9 10/10/2019  Decreased Interest 0  Down, Depressed, Hopeless 0  PHQ - 2 Score 0    ADLs:  In your present state of health, do you have any difficulty performing the following activities: 10/10/2019 07/13/2019  Hearing? N N  Comment hearing aids, doesn't wear frequently -  Vision? N N  Difficulty concentrating or making decisions? N N  Walking or climbing stairs? N N  Dressing or bathing? N N  Doing  errands, shopping? N N  Some recent data might be hidden     Cognitive Testing  Alert? Yes  Normal Appearance?Yes  Oriented to person? Yes  Place? Yes   Time? Yes  Recall of three objects?  3/3  Can perform simple calculations? Yes  Displays appropriate judgment?Yes  Can read the correct time from a watch face?Yes  EOL planning: Does Patient Have a Medical Advance Directive?: Yes Type of Advance Directive: Healthcare Power of Attorney, Living will Does patient want to make changes to medical advance directive?: No - Patient declined Copy of [place] in Chart?: No - copy requested   Objective:   Today's Vitals   10/10/19 1117  BP: 118/82  Pulse: 81  Temp: (!) 97.5 F (36.4 C)  SpO2: 95%  Weight: 204 lb 9.6 oz (92.8 kg)   Body mass index is 27.37 kg/m.  General appearance: alert, no distress, WD/WN, male HEENT: normocephalic, sclerae anicteric, TMs pearly, nares patent, no discharge or erythema,  pharynx normal Oral cavity: MMM, no lesions Neck: supple, no lymphadenopathy, no thyromegaly, no masses Heart: RRR, normal S1, S2, no murmurs Lungs: CTA bilaterally, no wheezes, rhonchi, or rales Abdomen: +bs, soft, non tender, non distended, no masses, no hepatomegaly, no splenomegaly Musculoskeletal: nontender, no swelling, no obvious deformity Extremities: scant non-pitting pedal edema on R, no cyanosis, no clubbing Pulses: 2+ symmetric, upper and lower extremities, normal cap refill Neurological: alert, oriented x 3, CN2-12 intact, strength normal upper extremities and lower extremities, sensation normal throughout, DTRs 2+ throughout, no cerebellar signs, gait normal Psychiatric: normal affect, behavior normal, pleasant   Medicare Attestation I have personally reviewed: The patient's medical and social history Their use of alcohol, tobacco or illicit drugs Their current medications and supplements The patient's functional ability including ADLs,fall risks, home safety risks, cognitive, and hearing and visual impairment Diet and physical activities Evidence for depression or mood disorders  The patient's weight, height, BMI, and visual acuity have been recorded in the chart.  I have made referrals, counseling, and provided education to the patient based on review of the above and I have provided the patient with a written personalized care plan for preventive services.     Izora Ribas, NP   10/10/2019

## 2019-10-10 ENCOUNTER — Encounter: Payer: Self-pay | Admitting: Adult Health

## 2019-10-10 ENCOUNTER — Ambulatory Visit (INDEPENDENT_AMBULATORY_CARE_PROVIDER_SITE_OTHER): Payer: Medicare Other | Admitting: Adult Health

## 2019-10-10 ENCOUNTER — Other Ambulatory Visit: Payer: Self-pay

## 2019-10-10 VITALS — BP 118/82 | HR 81 | Temp 97.5°F | Wt 204.6 lb

## 2019-10-10 DIAGNOSIS — R351 Nocturia: Secondary | ICD-10-CM

## 2019-10-10 DIAGNOSIS — M5136 Other intervertebral disc degeneration, lumbar region: Secondary | ICD-10-CM

## 2019-10-10 DIAGNOSIS — R6889 Other general symptoms and signs: Secondary | ICD-10-CM | POA: Diagnosis not present

## 2019-10-10 DIAGNOSIS — R7303 Prediabetes: Secondary | ICD-10-CM | POA: Diagnosis not present

## 2019-10-10 DIAGNOSIS — K219 Gastro-esophageal reflux disease without esophagitis: Secondary | ICD-10-CM | POA: Diagnosis not present

## 2019-10-10 DIAGNOSIS — I10 Essential (primary) hypertension: Secondary | ICD-10-CM

## 2019-10-10 DIAGNOSIS — K2100 Gastro-esophageal reflux disease with esophagitis, without bleeding: Secondary | ICD-10-CM | POA: Diagnosis not present

## 2019-10-10 DIAGNOSIS — Z79899 Other long term (current) drug therapy: Secondary | ICD-10-CM | POA: Diagnosis not present

## 2019-10-10 DIAGNOSIS — Z0000 Encounter for general adult medical examination without abnormal findings: Secondary | ICD-10-CM

## 2019-10-10 DIAGNOSIS — E782 Mixed hyperlipidemia: Secondary | ICD-10-CM

## 2019-10-10 DIAGNOSIS — E663 Overweight: Secondary | ICD-10-CM

## 2019-10-10 DIAGNOSIS — N401 Enlarged prostate with lower urinary tract symptoms: Secondary | ICD-10-CM | POA: Insufficient documentation

## 2019-10-10 DIAGNOSIS — K5730 Diverticulosis of large intestine without perforation or abscess without bleeding: Secondary | ICD-10-CM

## 2019-10-10 DIAGNOSIS — E349 Endocrine disorder, unspecified: Secondary | ICD-10-CM

## 2019-10-10 DIAGNOSIS — N529 Male erectile dysfunction, unspecified: Secondary | ICD-10-CM

## 2019-10-10 DIAGNOSIS — Z974 Presence of external hearing-aid: Secondary | ICD-10-CM

## 2019-10-10 DIAGNOSIS — Z0001 Encounter for general adult medical examination with abnormal findings: Secondary | ICD-10-CM

## 2019-10-10 DIAGNOSIS — E559 Vitamin D deficiency, unspecified: Secondary | ICD-10-CM

## 2019-10-10 DIAGNOSIS — Z981 Arthrodesis status: Secondary | ICD-10-CM

## 2019-10-10 DIAGNOSIS — I700 Atherosclerosis of aorta: Secondary | ICD-10-CM | POA: Diagnosis not present

## 2019-10-10 DIAGNOSIS — R7309 Other abnormal glucose: Secondary | ICD-10-CM

## 2019-10-10 DIAGNOSIS — J449 Chronic obstructive pulmonary disease, unspecified: Secondary | ICD-10-CM

## 2019-10-10 HISTORY — DX: Presence of external hearing-aid: Z97.4

## 2019-10-10 NOTE — Patient Instructions (Addendum)
Jacob Larsen , Thank you for taking time to come for your Medicare Wellness Visit. I appreciate your ongoing commitment to your health goals. Please review the following plan we discussed and let me know if I can assist you in the future.   These are the goals we discussed: Goals    . Exercise 150 min/wk Moderate Activity    . Weight (lb) < 200 lb (90.7 kg)       This is a list of the screening recommended for you and due dates:  Health Maintenance  Topic Date Due  . Tetanus Vaccine  05/27/2024  . Colon Cancer Screening  10/08/2028  . Flu Shot  Completed  .  Hepatitis C: One time screening is recommended by Center for Disease Control  (CDC) for  adults born from [birth year] through 1966.   Completed  . Pneumonia vaccines  Completed       Exercising to Stay Healthy To become healthy and stay healthy, it is recommended that you do moderate-intensity and vigorous-intensity exercise. You can tell that you are exercising at a moderate intensity if your heart starts beating faster and you start breathing faster but can still hold a conversation. You can tell that you are exercising at a vigorous intensity if you are breathing much harder and faster and cannot hold a conversation while exercising. Exercising regularly is important. It has many health benefits, such as:  Improving overall fitness, flexibility, and endurance.  Increasing bone density.  Helping with weight control.  Decreasing body fat.  Increasing muscle strength.  Reducing stress and tension.  Improving overall health. How often should I exercise? Choose an activity that you enjoy, and set realistic goals. Your health care provider can help you make an activity plan that works for you. Exercise regularly as told by your health care provider. This may include:  Doing strength training two times a week, such as: ? Lifting weights. ? Using resistance bands. ? Push-ups. ? Sit-ups. ? Yoga.  Doing a certain intensity of  exercise for a given amount of time. Choose from these options: ? A total of 150 minutes of moderate-intensity exercise every week. ? A total of 75 minutes of vigorous-intensity exercise every week. ? A mix of moderate-intensity and vigorous-intensity exercise every week. Children, pregnant women, people who have not exercised regularly, people who are overweight, and older adults may need to talk with a health care provider about what activities are safe to do. If you have a medical condition, be sure to talk with your health care provider before you start a new exercise program. What are some exercise ideas? Moderate-intensity exercise ideas include:  Walking 1 mile (1.6 km) in about 15 minutes.  Biking.  Hiking.  Golfing.  Dancing.  Water aerobics. Vigorous-intensity exercise ideas include:  Walking 4.5 miles (7.2 km) or more in about 1 hour.  Jogging or running 5 miles (8 km) in about 1 hour.  Biking 10 miles (16.1 km) or more in about 1 hour.  Lap swimming.  Roller-skating or in-line skating.  Cross-country skiing.  Vigorous competitive sports, such as football, basketball, and soccer.  Jumping rope.  Aerobic dancing. What are some everyday activities that can help me to get exercise?  Jacob Larsen work, such as: ? Pushing a Conservation officer, nature. ? Raking and bagging leaves.  Washing your car.  Pushing a stroller.  Shoveling snow.  Gardening.  Washing windows or floors. How can I be more active in my day-to-day activities?  Use stairs  instead of an elevator.  Take a walk during your lunch break.  If you drive, park your car farther away from your work or school.  If you take public transportation, get off one stop early and walk the rest of the way.  Stand up or walk around during all of your indoor phone calls.  Get up, stretch, and walk around every 30 minutes throughout the day.  Enjoy exercise with a friend. Support to continue exercising will help you keep a  regular routine of activity. What guidelines can I follow while exercising?  Before you start a new exercise program, talk with your health care provider.  Do not exercise so much that you hurt yourself, feel dizzy, or get very short of breath.  Wear comfortable clothes and wear shoes with good support.  Drink plenty of water while you exercise to prevent dehydration or heat stroke.  Work out until your breathing and your heartbeat get faster. Where to find more information  [place] Department of Health and Human Services: [employer]at  Centers for Disease Control and Prevention (CDC): http://www.wolf.info/ Summary  Exercising regularly is important. It will improve your overall fitness, flexibility, and endurance.  Regular exercise also will improve your overall health. It can help you control your weight, reduce stress, and improve your bone density.  Do not exercise so much that you hurt yourself, feel dizzy, or get very short of breath.  Before you start a new exercise program, talk with your health care provider. This information is not intended to replace advice given to you by your health care provider. Make sure you discuss any questions you have with your health care provider. Document Revised: 08/11/2017 Document Reviewed: 07/20/2017 Elsevier Patient Education  Jacob Larsen.    Diverticulosis  Diverticulosis is a condition that develops when small pouches (diverticula) form in the wall of the large intestine (colon). The colon is where water is absorbed and stool (feces) is formed. The pouches form when the inside layer of the colon pushes through weak spots in the outer layers of the colon. You may have a few pouches or many of them. The pouches usually do not cause problems unless they become inflamed or infected. When this happens, the condition is called diverticulitis. What are the causes? The cause of this condition is not known. What increases the risk? The following  factors may make you more likely to develop this condition:  Being older than age 18. Your risk for this condition increases with age. Diverticulosis is rare among people younger than age 9. By age 51, many people have it.  Eating a low-fiber diet.  Having frequent constipation.  Being overweight.  Not getting enough exercise.  Smoking.  Taking over-the-counter pain medicines, like aspirin and ibuprofen.  Having a family history of diverticulosis. What are the signs or symptoms? In most people, there are no symptoms of this condition. If you do have symptoms, they may include:  Bloating.  Cramps in the abdomen.  Constipation or diarrhea.  Pain in the lower left side of the abdomen. How is this diagnosed? Because diverticulosis usually has no symptoms, it is most often diagnosed during an exam for other colon problems. The condition may be diagnosed by:  Using a flexible scope to examine the colon (colonoscopy).  Taking an X-ray of the colon after dye has been put into the colon (barium enema).  Having a CT scan. How is this treated? You may not need treatment for this condition. Your health  care provider may recommend treatment to prevent problems. You may need treatment if you have symptoms or if you previously had diverticulitis. Treatment may include:  Eating a high-fiber diet.  Taking a fiber supplement.  Taking a live bacteria supplement (probiotic).  Taking medicine to relax your colon. Follow these instructions at home: Medicines  Take over-the-counter and prescription medicines only as told by your health care provider.  If told by your health care provider, take a fiber supplement or probiotic. Constipation prevention Your condition may cause constipation. To prevent or treat constipation, you may need to:  Drink enough fluid to keep your urine pale yellow.  Take over-the-counter or prescription medicines.  Eat foods that are high in fiber, such as  beans, whole grains, and fresh fruits and vegetables.  Limit foods that are high in fat and processed sugars, such as fried or sweet foods.  General instructions  Try not to strain when you have a bowel movement.  Keep all follow-up visits as told by your health care provider. This is important. Contact a health care provider if you:  Have pain in your abdomen.  Have bloating.  Have cramps.  Have not had a bowel movement in 3 days. Get help right away if:  Your pain gets worse.  Your bloating becomes very bad.  You have a fever or chills, and your symptoms suddenly get worse.  You vomit.  You have bowel movements that are bloody or black.  You have bleeding from your rectum. Summary  Diverticulosis is a condition that develops when small pouches (diverticula) form in the wall of the large intestine (colon).  You may have a few pouches or many of them.  This condition is most often diagnosed during an exam for other colon problems.  Treatment may include increasing the fiber in your diet, taking supplements, or taking medicines. This information is not intended to replace advice given to you by your health care provider. Make sure you discuss any questions you have with your health care provider. Document Revised: 03/28/2019 Document Reviewed: 03/28/2019 Elsevier Patient Education  [place].

## 2019-10-11 ENCOUNTER — Other Ambulatory Visit: Payer: Self-pay | Admitting: Adult Health

## 2019-10-11 DIAGNOSIS — N289 Disorder of kidney and ureter, unspecified: Secondary | ICD-10-CM

## 2019-10-11 LAB — LIPID PANEL
Cholesterol: 137 mg/dL (ref <200)
HDL: 40 mg/dL (ref >=40)
LDL Cholesterol (Calc): 74 mg/dL (calc)
Non-HDL Cholesterol (Calc): 97 mg/dL (calc) (ref <130)
Total CHOL/HDL Ratio: 3.4 (calc) (ref <5.0)
Triglycerides: 152 mg/dL — ABNORMAL HIGH (ref <150)

## 2019-10-11 LAB — CBC WITH DIFFERENTIAL/PLATELET
Absolute Monocytes: 545 cells/uL (ref 200–950)
Basophils Absolute: 38 cells/uL (ref 0–200)
Basophils Relative: 0.7 %
Eosinophils Absolute: 162 cells/uL (ref 15–500)
Eosinophils Relative: 3 %
HCT: 44.6 % (ref 38.5–50.0)
Hemoglobin: 15.2 g/dL (ref 13.2–17.1)
Lymphs Abs: 1085 cells/uL (ref 850–3900)
MCH: 32.2 pg (ref 27.0–33.0)
MCHC: 34.1 g/dL (ref 32.0–36.0)
MCV: 94.5 fL (ref 80.0–100.0)
MPV: 10.8 fL (ref 7.5–12.5)
Monocytes Relative: 10.1 %
Neutro Abs: 3569 cells/uL (ref 1500–7800)
Neutrophils Relative %: 66.1 %
Platelets: 240 10*3/uL (ref 140–400)
RBC: 4.72 10*6/uL (ref 4.20–5.80)
RDW: 12.7 % (ref 11.0–15.0)
Total Lymphocyte: 20.1 %
WBC: 5.4 10*3/uL (ref 3.8–10.8)

## 2019-10-11 LAB — COMPLETE METABOLIC PANEL WITH GFR
AG Ratio: 1.3 (calc) (ref 1.0–2.5)
ALT: 28 U/L (ref 9–46)
AST: 21 U/L (ref 10–35)
Albumin: 4 g/dL (ref 3.6–5.1)
Alkaline phosphatase (APISO): 73 U/L (ref 35–144)
BUN: 18 mg/dL (ref 7–25)
CO2: 26 mmol/L (ref 20–32)
Calcium: 9.2 mg/dL (ref 8.6–10.3)
Chloride: 105 mmol/L (ref 98–110)
Creat: 1.15 mg/dL (ref 0.70–1.25)
GFR, Est African American: 75 mL/min/{1.73_m2} (ref >=60)
GFR, Est Non African American: 65 mL/min/{1.73_m2} (ref >=60)
Globulin: 3 g/dL (calc) (ref 1.9–3.7)
Glucose, Bld: 87 mg/dL (ref 65–99)
Potassium: 4.5 mmol/L (ref 3.5–5.3)
Sodium: 137 mmol/L (ref 135–146)
Total Bilirubin: 0.4 mg/dL (ref 0.2–1.2)
Total Protein: 7 g/dL (ref 6.1–8.1)

## 2019-10-11 LAB — TSH: TSH: 4.38 mIU/L (ref 0.40–4.50)

## 2019-10-11 LAB — MAGNESIUM: Magnesium: 2.2 mg/dL (ref 1.5–2.5)

## 2019-10-13 ENCOUNTER — Other Ambulatory Visit: Payer: Self-pay | Admitting: Internal Medicine

## 2019-10-16 DIAGNOSIS — M961 Postlaminectomy syndrome, not elsewhere classified: Secondary | ICD-10-CM | POA: Diagnosis not present

## 2019-10-30 DIAGNOSIS — M961 Postlaminectomy syndrome, not elsewhere classified: Secondary | ICD-10-CM | POA: Diagnosis not present

## 2019-10-30 DIAGNOSIS — M5416 Radiculopathy, lumbar region: Secondary | ICD-10-CM | POA: Diagnosis not present

## 2019-11-05 DIAGNOSIS — R3912 Poor urinary stream: Secondary | ICD-10-CM | POA: Diagnosis not present

## 2019-11-08 DIAGNOSIS — H25813 Combined forms of age-related cataract, bilateral: Secondary | ICD-10-CM | POA: Diagnosis not present

## 2019-11-08 DIAGNOSIS — H40033 Anatomical narrow angle, bilateral: Secondary | ICD-10-CM | POA: Diagnosis not present

## 2019-11-14 ENCOUNTER — Other Ambulatory Visit: Payer: Self-pay | Admitting: *Deleted

## 2019-11-14 ENCOUNTER — Ambulatory Visit (INDEPENDENT_AMBULATORY_CARE_PROVIDER_SITE_OTHER): Payer: Medicare Other

## 2019-11-14 ENCOUNTER — Other Ambulatory Visit: Payer: Self-pay

## 2019-11-14 DIAGNOSIS — N289 Disorder of kidney and ureter, unspecified: Secondary | ICD-10-CM

## 2019-11-14 DIAGNOSIS — Z79899 Other long term (current) drug therapy: Secondary | ICD-10-CM

## 2019-11-14 LAB — BASIC METABOLIC PANEL WITH GFR
BUN: 15 mg/dL (ref 7–25)
CO2: 26 mmol/L (ref 20–32)
Calcium: 9.2 mg/dL (ref 8.6–10.3)
Chloride: 105 mmol/L (ref 98–110)
Creat: 0.92 mg/dL (ref 0.70–1.25)
GFR, Est African American: 98 mL/min/{1.73_m2} (ref >=60)
GFR, Est Non African American: 85 mL/min/{1.73_m2} (ref >=60)
Glucose, Bld: 93 mg/dL (ref 65–99)
Potassium: 4.6 mmol/L (ref 3.5–5.3)
Sodium: 137 mmol/L (ref 135–146)

## 2019-11-14 NOTE — Progress Notes (Signed)
Patient presents to the office for a nurse visit to have labs done to check kidney function. Vitals taken and recorded.

## 2019-12-23 DIAGNOSIS — M5416 Radiculopathy, lumbar region: Secondary | ICD-10-CM | POA: Diagnosis not present

## 2020-01-07 ENCOUNTER — Other Ambulatory Visit: Payer: Self-pay | Admitting: Internal Medicine

## 2020-01-07 DIAGNOSIS — G894 Chronic pain syndrome: Secondary | ICD-10-CM

## 2020-01-15 ENCOUNTER — Encounter: Payer: Self-pay | Admitting: Internal Medicine

## 2020-01-15 NOTE — Patient Instructions (Signed)

## 2020-01-15 NOTE — Progress Notes (Signed)
History of Present Illness:       This very nice 69 y.o.male presents for 3 month follow up with HTN, HLD, Pre-Diabetes, and Vitamin D Deficiency. Patient has GERD controlled on his Prilosec & Diet.      Patient has hx/o  OBPH/LUTS and had recent Rezum Vaper treatment by Dr Gloriann Loan with marked improvement in his LUTS.      Patient has had #2 neck surgeries and #  back surgeries for DDD/HNP with consequent chronic neck & LB Pain. Last surgery was L5-S1 decompression and fusion in Apr 2018  by Dr Annette Stable. Patient relates Dr Maryjean Ka has recommended a Spinal Cord Stimulator for Pain Control.      Patient is treated for HTN (2013)  BP has been controlled at home. Today's BP is at goal - 110/70. Patient has had no complaints of any cardiac type chest pain, palpitations, dyspnea / orthopnea / PND, dizziness, claudication, or dependent edema.      Hyperlipidemia is controlled with diet & Atorvastatin / ezetimibe. Patient denies myalgias or other med SE's. Last Lipids were at goal:  Lab Results  Component Value Date   CHOL 137 10/10/2019   HDL 40 10/10/2019   LDLCALC 74 10/10/2019   TRIG 152 (H) 10/10/2019   CHOLHDL 3.4 10/10/2019    Patient is on Testosterone replacement with improved sense of well being and stamina.Also, the patient has history of PreDiabetes  (A1c 6.0% / 2014) and has had no symptoms of reactive hypoglycemia, diabetic polys, paresthesias or visual blurring.  Last A1c was Normal & at goal:  Lab Results  Component Value Date   HGBA1C 5.5 07/10/2019       Further, the patient also has history of Vitamin D Deficiency and supplements vitamin D without any suspected side-effects. Last vitamin D was near goal:  Lab Results  Component Value Date   VD25OH 57 07/10/2019    Current Outpatient Medications on File Prior to Visit  Medication Sig  . atorvastatin (LIPITOR) 80 MG tablet TAKE 1 TABLET (80 MG TOTAL) BY MOUTH DAILY. FOR CHOLESTEROL  . Cholecalciferol (VITAMIN D)  2000 UNITS tablet Take 4,000 Units by mouth daily.   . diclofenac (VOLTAREN) 75 MG EC tablet Take 1 tablet 2 x /day with Food for Pain & Inflammation  . finasteride (PROSCAR) 5 MG tablet Take 1 tablet Daily for Prostate  . Flaxseed, Linseed, (FLAX SEED OIL) 1000 MG CAPS Take 1 capsule (1,000 mg total) by mouth 2 (two) times daily.  . fluticasone (FLONASE) 50 MCG/ACT nasal spray SPRAY 2 SPRAYS INTO EACH NOSTRIL EVERY DAY  . loratadine (CLARITIN) 10 MG tablet Take 1 tablet (10 mg total) by mouth as needed for allergies.  . Magnesium 250 MG TABS Take 250 mg by mouth daily.  . Needles & Syringes MISC Use for Testosterone inject every 2 weeks.  Marland Kitchen olmesartan (BENICAR) 40 MG tablet Take 1 tablet Daily for BP (Dx I10)  . omeprazole (PRILOSEC) 20 MG capsule Take 1 capsule 2 x /day as needed for Indigestion &  Acid Reflux  . oxyCODONE (OXY IR/ROXICODONE) 5 MG immediate release tablet Take 5 mg by mouth every 4 (four) hours as needed for severe pain.  . pregabalin (LYRICA) 100 MG capsule TAKE 1 CAPSULE 3 TO 4 X /DAY FOR CHRONIC PAIN  . testosterone cypionate (DEPOTESTOSTERONE CYPIONATE) 200 MG/ML injection INJECT 2 MLS INTO MUSCLE EVERY 2 WEEKS  . vitamin B-12 (CYANOCOBALAMIN) 1000 MCG tablet Take 1,000 mcg by mouth  daily.  . sildenafil (VIAGRA) 100 MG tablet Take 1 tablet (100 mg total) by mouth as needed for erectile dysfunction.   No current facility-administered medications on file prior to visit.    Allergies  Allergen Reactions  . Other Hives    Beef-hives  RED MEAT  . Aspirin Hives and Rash  . Salicylates Rash    PMHx:   Past Medical History:  Diagnosis Date  . Allergy   . Anxiety    takes Valium daily as needed  . Arthritis    in neck  . BPH (benign prostatic hyperplasia)    takes Flomax daily  . Chronic back pain    DDD  . COPD (chronic obstructive pulmonary disease) ([place])   . Diverticulosis   . Foot drop    both feet  . GERD (gastroesophageal reflux disease)    takes  Omeprazole daily as well as Zantac  . Hyperlipidemia    takes Atorvastatin daily  . Hypertension   . Hypogonadism male   . Nocturia   . Numbness    only in 4th and 5th finger on left hand and left leg  . Seasonal allergies    takes Claritin daily as needed and uses Flonase daily as needed  . Urinary frequency    takes Flomax daily  . Wears hearing aid 10/10/2019    Immunization History  Administered Date(s) Administered  . Influenza Split 06/18/2015  . Influenza, High Dose Seasonal PF 06/20/2016, 06/06/2017, 07/31/2018  . Influenza-Unspecified 08/17/2014, 07/10/2019  . PFIZER SARS-COV-2 Vaccination 10/18/2019, 11/08/2019  . PPD Test 05/27/2014  . Pneumococcal Conjugate-13 12/09/2015  . Pneumococcal Polysaccharide-23 04/08/2013, 06/06/2017  . Td 09/13/2003  . Tdap 05/27/2014  . Zoster Recombinat (Shingrix) 11/29/2017, 02/08/2018    Past Surgical History:  Procedure Laterality Date  . BACK SURGERY  2016 x 2 L3-L5, 12/2016 S1   lumbar fusion x 3  . CERVICAL FUSION  2011/2012  . COLONOSCOPY    . KNEE ARTHROSCOPY     right 1985; left 1992  . SEPTOPLASTY    . SHOULDER ARTHROSCOPY Left 2015  . SHOULDER ARTHROSCOPY WITH ROTATOR CUFF REPAIR Right 08/18/2017   Dr. Onnie Graham  . SHOULDER SURGERY Right 08    FHx:    Reviewed / unchanged  SHx:    Reviewed / unchanged   Systems Review:  Constitutional: Denies fever, chills, wt changes, headaches, insomnia, fatigue, night sweats, change in appetite. Eyes: Denies redness, blurred vision, diplopia, discharge, itchy, watery eyes.  ENT: Denies discharge, congestion, post nasal drip, epistaxis, sore throat, earache, hearing loss, dental pain, tinnitus, vertigo, sinus pain, snoring.  CV: Denies chest pain, palpitations, irregular heartbeat, syncope, dyspnea, diaphoresis, orthopnea, PND, claudication or edema. Respiratory: denies cough, dyspnea, DOE, pleurisy, hoarseness, laryngitis, wheezing.  Gastrointestinal: Denies dysphagia,  odynophagia, heartburn, reflux, water brash, abdominal pain or cramps, nausea, vomiting, bloating, diarrhea, constipation, hematemesis, melena, hematochezia  or hemorrhoids. Genitourinary: Denies dysuria, frequency, urgency, nocturia, hesitancy, discharge, hematuria or flank pain. Musculoskeletal: Denies arthralgias, myalgias, stiffness, jt. swelling, pain, limping or strain/sprain.  Skin: Denies pruritus, rash, hives, warts, acne, eczema or change in skin lesion(s). Neuro: No weakness, tremor, incoordination, spasms, paresthesia or pain. Psychiatric: Denies confusion, memory loss or sensory loss. Endo: Denies change in weight, skin or hair change.  Heme/Lymph: No excessive bleeding, bruising or enlarged lymph nodes.  Physical Exam  BP 110/70   Pulse 60   Temp (!) 97.4 F (36.3 C)   Resp 16   Ht 6' 0.5" (1.842 m)   Wt  199 lb 9.6 oz (90.5 kg)   BMI 26.70 kg/m   Appears  well nourished, well groomed  and in no distress.  Eyes: PERRLA, EOMs, conjunctiva no swelling or erythema. Sinuses: No frontal/maxillary tenderness ENT/Mouth: EAC's clear, TM's nl w/o erythema, bulging. Nares clear w/o erythema, swelling, exudates. Oropharynx clear without erythema or exudates. Oral hygiene is good. Tongue normal, non obstructing. Hearing intact.  Neck: Supple. Thyroid not palpable. Car 2+/2+ without bruits, nodes or JVD. Chest: Respirations nl with BS clear & equal w/o rales, rhonchi, wheezing or stridor.  Cor: Heart sounds normal w/ regular rate and rhythm without sig. murmurs, gallops, clicks or rubs. Peripheral pulses normal and equal  without edema.  Abdomen: Soft & bowel sounds normal. Non-tender w/o guarding, rebound, hernias, masses or organomegaly.  Lymphatics: Unremarkable.  Musculoskeletal: Full ROM all peripheral extremities, joint stability, 5/5 strength and normal gait.  Skin: Warm, dry without exposed rashes, lesions or ecchymosis apparent.  Neuro: Cranial nerves intact, reflexes equal  bilaterally. Sensory-motor testing grossly intact. Tendon reflexes grossly intact.  Pysch: Alert & oriented x 3.  Insight and judgement nl & appropriate. No ideations.  Assessment and Plan:  1. Essential hypertension  - Continue medication, monitor blood pressure at home.  - Continue DASH diet.  Reminder to go to the ER if any CP,  SOB, nausea, dizziness, severe HA, changes vision/speech.  - CBC with Differential/Platelet - COMPLETE METABOLIC PANEL WITH GFR - Magnesium - TSH  2.  - Continue diet/meds, exercise,& lifestyle modifications.  - Continue monitor periodic cholesterol/liver & renal functions  Hyperlipidemia, mixed  - Lipid panel - TSH  3. Abnormal glucose  - Continue diet, exercise  - Lifestyle modifications.  - Monitor appropriate labs.  - Hemoglobin A1c - Insulin, random  4. Vitamin D deficiency  - Continue supplementation.  - VITAMIN D 25 Hydroxy  5. Gastroesophageal reflux disease without esophagitis  - CBC with Differential/Platelet  6. Prediabetes  - Hemoglobin A1c - Insulin, random  7. Chronic pain syndrome   8. Testosterone deficiency  - Testosterone  9. Medication management  - CBC with Differential/Platelet - COMPLETE METABOLIC PANEL WITH GFR - Magnesium - Lipid panel - TSH - Hemoglobin A1c - Insulin, random - VITAMIN D 25 Hydroxy  - Testosterone       Discussed  regular exercise, BP monitoring, weight control to achieve/maintain BMI less than 25 and discussed med and SE's. Recommended labs to assess and monitor clinical status with further disposition pending results of labs.  I discussed the assessment and treatment plan with the patient. The patient was provided an opportunity to ask questions and all were answered. The patient agreed with the plan and demonstrated an understanding of the instructions.  I provided over 30 minutes of exam, counseling, chart review and  complex critical decision making.         The patient was  advised to call back or seek an in-person evaluation if the symptoms worsen or if the condition fails to improve as anticipated.   Kirtland Bouchard, MD

## 2020-01-16 ENCOUNTER — Ambulatory Visit (INDEPENDENT_AMBULATORY_CARE_PROVIDER_SITE_OTHER): Payer: Medicare Other | Admitting: Internal Medicine

## 2020-01-16 ENCOUNTER — Other Ambulatory Visit: Payer: Self-pay

## 2020-01-16 VITALS — BP 110/70 | HR 60 | Temp 97.4°F | Resp 16 | Ht 72.5 in | Wt 199.6 lb

## 2020-01-16 DIAGNOSIS — Z79899 Other long term (current) drug therapy: Secondary | ICD-10-CM

## 2020-01-16 DIAGNOSIS — E559 Vitamin D deficiency, unspecified: Secondary | ICD-10-CM

## 2020-01-16 DIAGNOSIS — G894 Chronic pain syndrome: Secondary | ICD-10-CM | POA: Diagnosis not present

## 2020-01-16 DIAGNOSIS — R7309 Other abnormal glucose: Secondary | ICD-10-CM | POA: Diagnosis not present

## 2020-01-16 DIAGNOSIS — R7303 Prediabetes: Secondary | ICD-10-CM | POA: Diagnosis not present

## 2020-01-16 DIAGNOSIS — K219 Gastro-esophageal reflux disease without esophagitis: Secondary | ICD-10-CM

## 2020-01-16 DIAGNOSIS — E349 Endocrine disorder, unspecified: Secondary | ICD-10-CM

## 2020-01-16 DIAGNOSIS — E782 Mixed hyperlipidemia: Secondary | ICD-10-CM

## 2020-01-16 DIAGNOSIS — I10 Essential (primary) hypertension: Secondary | ICD-10-CM

## 2020-01-17 LAB — LIPID PANEL
Cholesterol: 139 mg/dL (ref <200)
HDL: 34 mg/dL — ABNORMAL LOW (ref >=40)
LDL Cholesterol (Calc): 79 mg/dL (calc)
Non-HDL Cholesterol (Calc): 105 mg/dL (calc) (ref <130)
Total CHOL/HDL Ratio: 4.1 (calc) (ref <5.0)
Triglycerides: 166 mg/dL — ABNORMAL HIGH (ref <150)

## 2020-01-17 LAB — COMPLETE METABOLIC PANEL WITH GFR
AG Ratio: 1.2 (calc) (ref 1.0–2.5)
ALT: 21 U/L (ref 9–46)
AST: 16 U/L (ref 10–35)
Albumin: 3.7 g/dL (ref 3.6–5.1)
Alkaline phosphatase (APISO): 71 U/L (ref 35–144)
BUN: 17 mg/dL (ref 7–25)
CO2: 26 mmol/L (ref 20–32)
Calcium: 9.3 mg/dL (ref 8.6–10.3)
Chloride: 105 mmol/L (ref 98–110)
Creat: 1.09 mg/dL (ref 0.70–1.25)
GFR, Est African American: 80 mL/min/{1.73_m2} (ref >=60)
GFR, Est Non African American: 69 mL/min/{1.73_m2} (ref >=60)
Globulin: 3.1 g/dL (calc) (ref 1.9–3.7)
Glucose, Bld: 109 mg/dL — ABNORMAL HIGH (ref 65–99)
Potassium: 4.4 mmol/L (ref 3.5–5.3)
Sodium: 139 mmol/L (ref 135–146)
Total Bilirubin: 0.4 mg/dL (ref 0.2–1.2)
Total Protein: 6.8 g/dL (ref 6.1–8.1)

## 2020-01-17 LAB — VITAMIN D 25 HYDROXY (VIT D DEFICIENCY, FRACTURES): Vit D, 25-Hydroxy: 63 ng/mL (ref 30–100)

## 2020-01-17 LAB — CBC WITH DIFFERENTIAL/PLATELET
Absolute Monocytes: 360 cells/uL (ref 200–950)
Basophils Absolute: 20 cells/uL (ref 0–200)
Basophils Relative: 0.6 %
Eosinophils Absolute: 129 cells/uL (ref 15–500)
Eosinophils Relative: 3.9 %
HCT: 45 % (ref 38.5–50.0)
Hemoglobin: 15.2 g/dL (ref 13.2–17.1)
Lymphs Abs: 1099 cells/uL (ref 850–3900)
MCH: 32.1 pg (ref 27.0–33.0)
MCHC: 33.8 g/dL (ref 32.0–36.0)
MCV: 95.1 fL (ref 80.0–100.0)
MPV: 10.6 fL (ref 7.5–12.5)
Monocytes Relative: 10.9 %
Neutro Abs: 1693 cells/uL (ref 1500–7800)
Neutrophils Relative %: 51.3 %
Platelets: 268 10*3/uL (ref 140–400)
RBC: 4.73 10*6/uL (ref 4.20–5.80)
RDW: 13.3 % (ref 11.0–15.0)
Total Lymphocyte: 33.3 %
WBC: 3.3 10*3/uL — ABNORMAL LOW (ref 3.8–10.8)

## 2020-01-17 LAB — HEMOGLOBIN A1C
Hgb A1c MFr Bld: 5.5 % of total Hgb (ref <5.7)
Mean Plasma Glucose: 111 (calc)
eAG (mmol/L): 6.2 (calc)

## 2020-01-17 LAB — MAGNESIUM: Magnesium: 2.2 mg/dL (ref 1.5–2.5)

## 2020-01-17 LAB — TESTOSTERONE: Testosterone: 358 ng/dL (ref 250–827)

## 2020-01-17 LAB — TSH: TSH: 2.72 mIU/L (ref 0.40–4.50)

## 2020-01-17 LAB — INSULIN, RANDOM: Insulin: 55.8 u[IU]/mL — ABNORMAL HIGH

## 2020-01-30 DIAGNOSIS — R351 Nocturia: Secondary | ICD-10-CM | POA: Diagnosis not present

## 2020-01-30 DIAGNOSIS — N401 Enlarged prostate with lower urinary tract symptoms: Secondary | ICD-10-CM | POA: Diagnosis not present

## 2020-01-30 DIAGNOSIS — N5201 Erectile dysfunction due to arterial insufficiency: Secondary | ICD-10-CM | POA: Diagnosis not present

## 2020-02-19 DIAGNOSIS — M961 Postlaminectomy syndrome, not elsewhere classified: Secondary | ICD-10-CM | POA: Diagnosis not present

## 2020-02-19 DIAGNOSIS — M5416 Radiculopathy, lumbar region: Secondary | ICD-10-CM | POA: Diagnosis not present

## 2020-02-20 ENCOUNTER — Other Ambulatory Visit: Payer: Self-pay | Admitting: Internal Medicine

## 2020-03-02 DIAGNOSIS — M961 Postlaminectomy syndrome, not elsewhere classified: Secondary | ICD-10-CM | POA: Diagnosis not present

## 2020-03-19 ENCOUNTER — Other Ambulatory Visit: Payer: Self-pay | Admitting: Internal Medicine

## 2020-04-06 DIAGNOSIS — M5416 Radiculopathy, lumbar region: Secondary | ICD-10-CM | POA: Diagnosis not present

## 2020-04-08 ENCOUNTER — Encounter: Payer: Medicare Other | Admitting: Physician Assistant

## 2020-04-09 ENCOUNTER — Other Ambulatory Visit: Payer: Self-pay | Admitting: Internal Medicine

## 2020-04-20 ENCOUNTER — Ambulatory Visit: Payer: Medicare Other | Admitting: Physician Assistant

## 2020-04-26 NOTE — Progress Notes (Signed)
FOLLOW UP  Assessment and Plan:   Hypertension Due to bradycardia, cut the ziac in half and start benicar 20 mg, then after 1 month just go to benicar 40 mg daily and stop ziac due to bradycardia Monitor Heart rate as well Monitor blood pressure at home; patient to call if consistently greater than 130/80 Continue DASH diet.   Reminder to go to the ER if any CP, SOB, nausea, dizziness, severe HA, changes vision/speech, left arm numbness and tingling and jaw pain.  Cholesterol Currently at goal; continue current medications Continue low cholesterol diet and exercise.  Check lipid panel.   Overweight Long discussion about weight loss, diet, and exercise Recommended diet heavy in fruits and veggies and low in animal meats, cheeses, and dairy products, appropriate calorie intake Discussed ideal weight for height Will follow up in 3 months  Vitamin D Def Below goal at last visit; continue supplementation to maintain goal of 70-100 Check Vit D level  [place] neuroma Given information, follow up ortho first but can refer to podiatry  Right arm numbness ? Entrapment from shoulder versus elbow of ulnar nerve, will follow up ortho Try lyrica samples from Dr. Melford Aase he has at home  Continue diet and meds as discussed. Further disposition pending results of labs. Discussed med's effects and SE's.   Over 30 minutes of exam, counseling, chart review, and critical decision making was performed.   Future Appointments  Date Time Provider [place]  08/10/2020 10:00 AM Unk Pinto, MD [facility] None    ----------------------------------------------------------------------------------------------------------------------  HPI 69 y.o. male  presents for 3 month follow up on hypertension, cholesterol, diabetes, weight and vitamin D deficiency.   He is managed by Dr. Earnie Larsson for chronic lumbar pain, s/p cervical fusion in 2011 and 2012, and lumbar fusion x 3 in 2016 x 2 and  in 2018 - S1 - L3, and is prescribed pain medication through that clinic. Lab Results  Component Value Date   IRON 123 04/01/2019   TIBC 259 04/01/2019   Lab Results  Component Value Date   VITAMINB12 1,835 (H) 04/01/2019   He states his feet are more swelling, worse right leg, will have swelling into his foot, will feel cold from his knee down, gets better at night. States he has right back pain, got ESI in July and it did help his right leg numbness/tingling. Normal echo 2011  Wears compression socks.  He states he can lay flat, no SOB.  BMI is Body mass index is 28.09 kg/m., he has been working on diet and exercise. Wt Readings from Last 3 Encounters:  04/27/20 210 lb (95.3 kg)  01/16/20 199 lb 9.6 oz (90.5 kg)  11/14/19 207 lb (93.9 kg)   His blood pressure has been controlled at home started on bisoprolol last visit, states tolerating well, today their BP is BP: 126/84  He does workout. He denies chest pain, shortness of breath, dizziness.   He is on cholesterol medication (atorvastatin 80 mg daily) and denies myalgias. His cholesterol is at goal. The cholesterol last visit was:   Lab Results  Component Value Date   CHOL 139 01/16/2020   HDL 34 (L) 01/16/2020   LDLCALC 79 01/16/2020   TRIG 166 (H) 01/16/2020   CHOLHDL 4.1 01/16/2020    He has been working on diet and exercise for prediabetes, and denies foot ulcerations, increased appetite, nausea, paresthesia of the feet, polydipsia, polyuria, visual disturbances, vomiting and weight loss. Last A1C in the office was:  Lab  Results  Component Value Date   HGBA1C 5.5 01/16/2020   Lab Results  Component Value Date   GFRAA 80 01/16/2020   Patient is on Vitamin D supplement.   Lab Results  Component Value Date   VD25OH 15 01/16/2020     He has a history of testosterone deficiency and is on testosterone replacement. He states that the testosterone helps with his energy, libido, muscle mass. Lab Results  Component Value  Date   TESTOSTERONE 358 01/16/2020     Current Medications:   Current Outpatient Medications (Endocrine & Metabolic):  .  testosterone cypionate (DEPOTESTOSTERONE CYPIONATE) 200 MG/ML injection, INJECT 2 MLS INTO MUSCLE EVERY 2 WEEKS  Current Outpatient Medications (Cardiovascular):  .  olmesartan (BENICAR) 40 MG tablet, Take 1 tablet Daily for BP (Dx I10) .  atorvastatin (LIPITOR) 80 MG tablet, TAKE 1 TABLET (80 MG TOTAL) BY MOUTH DAILY. FOR CHOLESTEROL .  sildenafil (VIAGRA) 100 MG tablet, Take 1 tablet (100 mg total) by mouth as needed for erectile dysfunction.  Current Outpatient Medications (Respiratory):  .  fluticasone (FLONASE) 50 MCG/ACT nasal spray, SPRAY 2 SPRAYS INTO EACH NOSTRIL EVERY DAY .  loratadine (CLARITIN) 10 MG tablet, Take 1 tablet (10 mg total) by mouth as needed for allergies.  Current Outpatient Medications (Analgesics):  .  diclofenac (VOLTAREN) 75 MG EC tablet, TAKE 1 TABLET 2 X /DAY WITH FOOD FOR PAIN & INFLAMMATION .  oxyCODONE (OXY IR/ROXICODONE) 5 MG immediate release tablet, Take 5 mg by mouth every 4 (four) hours as needed for severe pain.  Current Outpatient Medications (Hematological):  .  vitamin B-12 (CYANOCOBALAMIN) 1000 MCG tablet, Take 1,000 mcg by mouth daily.  Current Outpatient Medications (Other):  Marland Kitchen  Cholecalciferol (VITAMIN D) 2000 UNITS tablet, Take 4,000 Units by mouth daily.  .  finasteride (PROSCAR) 5 MG tablet, TAKE 1 TABLET DAILY FOR PROSTATE .  Flaxseed, Linseed, (FLAX SEED OIL) 1000 MG CAPS, Take 1 capsule (1,000 mg total) by mouth 2 (two) times daily. .  Magnesium 250 MG TABS, Take 250 mg by mouth daily. .  Needles & Syringes MISC, Use for Testosterone inject every 2 weeks. Marland Kitchen  omeprazole (PRILOSEC) 20 MG capsule, Take 1 capsule 2 x /day as needed for Indigestion &  Acid Reflux .  pregabalin (LYRICA) 100 MG capsule, TAKE 1 CAPSULE 3 TO 4 X /DAY FOR CHRONIC PAIN   Allergies:  Allergies  Allergen Reactions  . Other Hives     Beef-hives  RED MEAT  . Aspirin Hives and Rash  . Salicylates Rash     Medical History:  Past Medical History:  Diagnosis Date  . Allergy   . Anxiety    takes Valium daily as needed  . Arthritis    in neck  . BPH (benign prostatic hyperplasia)    takes Flomax daily  . Chronic back pain    DDD  . COPD (chronic obstructive pulmonary disease) ([place])   . Diverticulosis   . Foot drop    both feet  . GERD (gastroesophageal reflux disease)    takes Omeprazole daily as well as Zantac  . Hyperlipidemia    takes Atorvastatin daily  . Hypertension   . Hypogonadism male   . Nocturia   . Numbness    only in 4th and 5th finger on left hand and left leg  . Seasonal allergies    takes Claritin daily as needed and uses Flonase daily as needed  . Urinary frequency    takes Flomax daily  .  Wears hearing aid 10/10/2019   Family history- Reviewed and unchanged Social history- Reviewed and unchanged   Review of Systems:  Review of Systems  Constitutional: Negative for malaise/fatigue and weight loss.  HENT: Negative for hearing loss and tinnitus.   Eyes: Negative for blurred vision and double vision.  Respiratory: Negative for cough, shortness of breath and wheezing.   Cardiovascular: Positive for leg swelling. Negative for chest pain, palpitations, orthopnea, claudication and PND.  Gastrointestinal: Negative for abdominal pain, blood in stool, constipation, diarrhea, heartburn, melena, nausea and vomiting.  Genitourinary: Negative.   Musculoskeletal: Positive for back pain. Negative for falls, joint pain and myalgias.  Skin: Negative for rash.  Neurological: Positive for sensory change. Negative for dizziness, tingling, weakness and headaches.  Endo/Heme/Allergies: Negative for polydipsia.  Psychiatric/Behavioral: Negative.   All other systems reviewed and are negative.   Physical Exam: BP 126/84   Pulse 90   Temp 97.7 F (36.5 C)   Wt 210 lb (95.3 kg)   SpO2 97%   BMI  28.09 kg/m  Wt Readings from Last 3 Encounters:  04/27/20 210 lb (95.3 kg)  01/16/20 199 lb 9.6 oz (90.5 kg)  11/14/19 207 lb (93.9 kg)   General Appearance: Well nourished, in no apparent distress. Eyes: PERRLA, EOMs, conjunctiva no swelling or erythema Sinuses: No Frontal/maxillary tenderness ENT/Mouth: Ext aud canals clear, TMs without erythema, bulging. No erythema, swelling, or exudate on post pharynx.  Tonsils not swollen or erythematous. Hearing normal.  Neck: Supple, thyroid normal.  Respiratory: Respiratory effort normal, BS equal bilaterally without rales, rhonchi, wheezing or stridor.  Cardio: RRR with no MRGs. Brisk peripheral pulses with 1-2 + edema.  Abdomen: Soft, + BS.  Non tender, no guarding, rebound, hernias, masses. Lymphatics: Non tender without lymphadenopathy.  Musculoskeletal: Full ROM, 5/5 strength, antalgic gait Skin: Warm, dry without rashes, lesions, ecchymosis.  Neuro: Cranial nerves intact. No cerebellar symptoms.  Psych: Awake and oriented X 3, normal affect, Insight and Judgment appropriate.    Vicie Mutters, PA-C 12:00 PM [facility]

## 2020-04-27 ENCOUNTER — Other Ambulatory Visit: Payer: Self-pay

## 2020-04-27 ENCOUNTER — Ambulatory Visit (INDEPENDENT_AMBULATORY_CARE_PROVIDER_SITE_OTHER): Payer: Medicare Other | Admitting: Physician Assistant

## 2020-04-27 ENCOUNTER — Encounter: Payer: Self-pay | Admitting: Physician Assistant

## 2020-04-27 VITALS — BP 126/84 | HR 90 | Temp 97.7°F | Wt 210.0 lb

## 2020-04-27 DIAGNOSIS — E782 Mixed hyperlipidemia: Secondary | ICD-10-CM | POA: Diagnosis not present

## 2020-04-27 DIAGNOSIS — Z79899 Other long term (current) drug therapy: Secondary | ICD-10-CM | POA: Diagnosis not present

## 2020-04-27 DIAGNOSIS — I10 Essential (primary) hypertension: Secondary | ICD-10-CM | POA: Diagnosis not present

## 2020-04-27 DIAGNOSIS — J449 Chronic obstructive pulmonary disease, unspecified: Secondary | ICD-10-CM | POA: Diagnosis not present

## 2020-04-27 DIAGNOSIS — R7309 Other abnormal glucose: Secondary | ICD-10-CM | POA: Diagnosis not present

## 2020-04-27 DIAGNOSIS — I700 Atherosclerosis of aorta: Secondary | ICD-10-CM

## 2020-04-27 MED ORDER — FUROSEMIDE 20 MG PO TABS
20.0000 mg | ORAL_TABLET | Freq: Every day | ORAL | 11 refills | Status: DC
Start: 2020-04-27 — End: 2020-08-10

## 2020-04-27 MED ORDER — ATORVASTATIN CALCIUM 80 MG PO TABS: ORAL_TABLET | ORAL | 0 refills | Status: DC

## 2020-04-27 NOTE — Patient Instructions (Addendum)
I'm going to give you Lasix This is a fluid pill that makes you pee more often, take it in the morning  If can make you pee more for about 6 hours If can deplete your magnesium and potassium so if you get leg cramps let me know and we may need to replace this Monitor your weight and blood pressure while on it If you get dizzy while on it stop the medication and call me Any questions or concerns stop the medication and call the office.   Follow up 1 month office visit   Go to the ER if any CP, SOB, nausea, dizziness, severe HA, changes vision/speech  VENOUS INSUFFICIENCY Our lower leg venous system is not the most reliable, the heart does NOT pump fluid up, there is a valve system.  The muscles of the leg squeeze and the blood moves up and a valve opens and close, then they squeeze, blood moves up and valves open and closes keeping the blood moving towards the heart.  Lots can go wrong with this valve system.  If someone is sitting or standing without movement, everyone will get swelling.  THINGS TO DO:  Do not stand or sit in one position for long periods of time. Do not sit with your legs crossed. Rest with your legs raised during the day.  Your legs have to be higher than your heart so that gravity will force the valves to open, so please really elevate your legs.   Wear elastic stockings or support hose. Do not wear other tight, encircling garments around the legs, pelvis, or waist.  [employer]  has a wide variety of well priced compression stockings. [place], [place] [id number] [id number] [place] has a good cheap selection, I like the socks, they are not as hard to get on  Walk as much as possible to increase blood flow.  Raise the foot of your bed at night with 2-inch blocks.  SEEK MEDICAL CARE IF:   The skin around your ankle starts to break down.  You have pain, redness, tenderness, or hard swelling developing in your leg over a vein.  You are  uncomfortable due to leg pain.  If you ever have shortness of breath with exertion or chest pain go to the ER.    Testing/Procedures: HOW TO TAKE YOUR BLOOD PRESSURE:  Rest 5 minutes before taking your blood pressure.  Don't smoke or drink caffeinated beverages for at least 30 minutes before.  Take your blood pressure before (not after) you eat.  Sit comfortably with your back supported and both feet on the floor (don't cross your legs).  Elevate your arm to heart level on a table or a desk.  Use the proper sized cuff. It should fit smoothly and snugly around your bare upper arm. There should be enough room to slip a fingertip under the cuff. The bottom edge of the cuff should be 1 inch above the crease of the elbow.  Due to a recent study, SPRINT, we have changed our goal for the systolic or top blood pressure number. Ideally we want your top number at 120.  In the [facility] Trial, 5000 people were randomized to a goal BP of 120 and 5000 people were randomized to a goal BP of less than 140. The patients with the goal BP at 120 had LESS DEMENTIA, LESS HEART ATTACKS, AND LESS STROKES, AS WELL AS OVERALL DECREASED MORTALITY OR DEATH RATE.   There  was another study that showed taking your blood pressure medications at night decrease cardiovascular events.  However if you are on a fluid pill, please take this in the morning.   If you are willing, our goal BP is the top number of 120.  Your most recent BP: BP: 126/84   Take your medications faithfully as instructed. Maintain a healthy weight. Get at least 150 minutes of aerobic exercise per week. Minimize salt intake. Minimize alcohol intake  DASH Eating Plan DASH stands for "Dietary Approaches to Stop Hypertension." The DASH eating plan is a healthy eating plan that has been shown to reduce high blood pressure (hypertension). Additional health benefits may include reducing the risk of type 2 diabetes mellitus, heart disease, and stroke.  The DASH eating plan may also help with weight loss. WHAT DO I NEED TO KNOW ABOUT THE DASH EATING PLAN? For the DASH eating plan, you will follow these general guidelines:  Choose foods with a percent daily value for sodium of less than 5% (as listed on the food label).  Use salt-free seasonings or herbs instead of table salt or sea salt.  Check with your health care provider or pharmacist before using salt substitutes.  Eat lower-sodium products, often labeled as "lower sodium" or "no salt added."  Eat fresh foods.  Eat more vegetables, fruits, and low-fat dairy products.  Choose whole grains. Look for the word "whole" as the first word in the ingredient list.  Choose fish and skinless chicken or [place] more often than red meat. Limit fish, poultry, and meat to 6 oz (170 g) each day.  Limit sweets, desserts, sugars, and sugary drinks.  Choose heart-healthy fats.  Limit cheese to 1 oz (28 g) per day.  Eat more home-cooked food and less restaurant, buffet, and fast food.  Limit fried foods.  Cook foods using methods other than frying.  Limit canned vegetables. If you do use them, rinse them well to decrease the sodium.  When eating at a restaurant, ask that your food be prepared with less salt, or no salt if possible. WHAT FOODS CAN I EAT? Seek help from a dietitian for individual calorie needs. Grains Whole grain or whole wheat bread. Brown rice. Whole grain or whole wheat pasta. Quinoa, bulgur, and whole grain cereals. Low-sodium cereals. Corn or whole wheat flour tortillas. Whole grain cornbread. Whole grain crackers. Low-sodium crackers. Vegetables Fresh or frozen vegetables (raw, steamed, roasted, or grilled). Low-sodium or reduced-sodium tomato and vegetable juices. Low-sodium or reduced-sodium tomato sauce and paste. Low-sodium or reduced-sodium canned vegetables.  Fruits All fresh, canned (in natural juice), or frozen fruits. Meat and Other Protein Products Ground  beef (85% or leaner), grass-fed beef, or beef trimmed of fat. Skinless chicken or [place]. Ground chicken or [place]. Pork trimmed of fat. All fish and seafood. Eggs. Dried beans, peas, or lentils. Unsalted nuts and seeds. Unsalted canned beans. Dairy Low-fat dairy products, such as skim or 1% milk, 2% or reduced-fat cheeses, low-fat ricotta or cottage cheese, or plain low-fat yogurt. Low-sodium or reduced-sodium cheeses. Fats and Oils Tub margarines without trans fats. Light or reduced-fat mayonnaise and salad dressings (reduced sodium). Avocado. Safflower, olive, or canola oils. Natural peanut or almond butter. Other Unsalted popcorn and pretzels. The items listed above may not be a complete list of recommended foods or beverages. Contact your dietitian for more options. WHAT FOODS ARE NOT RECOMMENDED? Grains White bread. White pasta. White rice. Refined cornbread. Bagels and croissants. Crackers that contain trans fat. Vegetables Creamed  or fried vegetables. Vegetables in a cheese sauce. Regular canned vegetables. Regular canned tomato sauce and paste. Regular tomato and vegetable juices. Fruits Dried fruits. Canned fruit in light or heavy syrup. Fruit juice. Meat and Other Protein Products Fatty cuts of meat. Ribs, chicken wings, bacon, sausage, bologna, salami, chitterlings, fatback, hot dogs, bratwurst, and packaged luncheon meats. Salted nuts and seeds. Canned beans with salt. Dairy Whole or 2% milk, cream, half-and-half, and cream cheese. Whole-fat or sweetened yogurt. Full-fat cheeses or blue cheese. Nondairy creamers and whipped toppings. Processed cheese, cheese spreads, or cheese curds. Condiments Onion and garlic salt, seasoned salt, table salt, and sea salt. Canned and packaged gravies. Worcestershire sauce. Tartar sauce. Barbecue sauce. Teriyaki sauce. Soy sauce, including reduced sodium. Steak sauce. Fish sauce. Oyster sauce. Cocktail sauce. Horseradish. Ketchup and mustard. Meat  flavorings and tenderizers. Bouillon cubes. Hot sauce. Tabasco sauce. Marinades. Taco seasonings. Relishes. Fats and Oils Butter, stick margarine, lard, shortening, ghee, and bacon fat. Coconut, palm kernel, or palm oils. Regular salad dressings. Other Pickles and olives. Salted popcorn and pretzels. The items listed above may not be a complete list of foods and beverages to avoid. Contact your dietitian for more information. WHERE CAN I FIND MORE INFORMATION? National Heart, Lung, and Blood Institute: travelstabloid.com Document Released: 08/18/2011 Document Revised: 01/13/2014 Document Reviewed: 07/03/2013 [facility] Patient Information 2015 [place], [place]. This information is not intended to replace advice given to you by your health care provider. Make sure you discuss any questions you have with your health care provider.

## 2020-04-28 LAB — CBC WITH DIFFERENTIAL/PLATELET
Absolute Monocytes: 510 cells/uL (ref 200–950)
Basophils Absolute: 22 cells/uL (ref 0–200)
Basophils Relative: 0.5 %
Eosinophils Absolute: 172 cells/uL (ref 15–500)
Eosinophils Relative: 3.9 %
HCT: 42.5 % (ref 38.5–50.0)
Hemoglobin: 14.3 g/dL (ref 13.2–17.1)
Lymphs Abs: 1135 cells/uL (ref 850–3900)
MCH: 32.6 pg (ref 27.0–33.0)
MCHC: 33.6 g/dL (ref 32.0–36.0)
MCV: 97 fL (ref 80.0–100.0)
MPV: 11.1 fL (ref 7.5–12.5)
Monocytes Relative: 11.6 %
Neutro Abs: 2561 cells/uL (ref 1500–7800)
Neutrophils Relative %: 58.2 %
Platelets: 219 10*3/uL (ref 140–400)
RBC: 4.38 10*6/uL (ref 4.20–5.80)
RDW: 12.9 % (ref 11.0–15.0)
Total Lymphocyte: 25.8 %
WBC: 4.4 10*3/uL (ref 3.8–10.8)

## 2020-04-28 LAB — COMPLETE METABOLIC PANEL WITH GFR
AG Ratio: 1.2 (calc) (ref 1.0–2.5)
ALT: 29 U/L (ref 9–46)
AST: 23 U/L (ref 10–35)
Albumin: 3.7 g/dL (ref 3.6–5.1)
Alkaline phosphatase (APISO): 83 U/L (ref 35–144)
BUN: 20 mg/dL (ref 7–25)
CO2: 25 mmol/L (ref 20–32)
Calcium: 8.9 mg/dL (ref 8.6–10.3)
Chloride: 106 mmol/L (ref 98–110)
Creat: 1.03 mg/dL (ref 0.70–1.25)
GFR, Est African American: 85 mL/min/{1.73_m2} (ref >=60)
GFR, Est Non African American: 74 mL/min/{1.73_m2} (ref >=60)
Globulin: 3 g/dL (calc) (ref 1.9–3.7)
Glucose, Bld: 92 mg/dL (ref 65–99)
Potassium: 4.8 mmol/L (ref 3.5–5.3)
Sodium: 135 mmol/L (ref 135–146)
Total Bilirubin: 0.4 mg/dL (ref 0.2–1.2)
Total Protein: 6.7 g/dL (ref 6.1–8.1)

## 2020-04-28 LAB — LIPID PANEL
Cholesterol: 143 mg/dL (ref <200)
HDL: 42 mg/dL (ref >=40)
LDL Cholesterol (Calc): 79 mg/dL (calc)
Non-HDL Cholesterol (Calc): 101 mg/dL (calc) (ref <130)
Total CHOL/HDL Ratio: 3.4 (calc) (ref <5.0)
Triglycerides: 122 mg/dL (ref <150)

## 2020-04-28 LAB — MAGNESIUM: Magnesium: 2.3 mg/dL (ref 1.5–2.5)

## 2020-04-28 LAB — HEMOGLOBIN A1C
Hgb A1c MFr Bld: 5.7 % of total Hgb — ABNORMAL HIGH (ref <5.7)
Mean Plasma Glucose: 117 (calc)
eAG (mmol/L): 6.5 (calc)

## 2020-04-28 LAB — TSH: TSH: 3.1 mIU/L (ref 0.40–4.50)

## 2020-04-30 ENCOUNTER — Other Ambulatory Visit: Payer: Self-pay | Admitting: Internal Medicine

## 2020-04-30 DIAGNOSIS — I10 Essential (primary) hypertension: Secondary | ICD-10-CM

## 2020-05-11 DIAGNOSIS — F1120 Opioid dependence, uncomplicated: Secondary | ICD-10-CM | POA: Insufficient documentation

## 2020-06-01 ENCOUNTER — Ambulatory Visit (INDEPENDENT_AMBULATORY_CARE_PROVIDER_SITE_OTHER): Payer: Medicare Other | Admitting: Physician Assistant

## 2020-06-01 ENCOUNTER — Encounter: Payer: Self-pay | Admitting: Physician Assistant

## 2020-06-01 ENCOUNTER — Other Ambulatory Visit: Payer: Self-pay

## 2020-06-01 VITALS — BP 128/74 | HR 64 | Temp 97.3°F | Wt 205.0 lb

## 2020-06-01 DIAGNOSIS — I700 Atherosclerosis of aorta: Secondary | ICD-10-CM | POA: Diagnosis not present

## 2020-06-01 DIAGNOSIS — Z79899 Other long term (current) drug therapy: Secondary | ICD-10-CM | POA: Diagnosis not present

## 2020-06-01 DIAGNOSIS — R609 Edema, unspecified: Secondary | ICD-10-CM

## 2020-06-01 DIAGNOSIS — R0600 Dyspnea, unspecified: Secondary | ICD-10-CM

## 2020-06-01 DIAGNOSIS — J449 Chronic obstructive pulmonary disease, unspecified: Secondary | ICD-10-CM

## 2020-06-01 NOTE — Progress Notes (Signed)
Assessment and Plan: Edema ? From lyrica/oxycodonet  May increase lasix to 40 mg pending kidney function Check BNP May need echo/CT ab to rule out compressive symptoms and lymphedema with some swelling into his feet   HPI 69 y.o.male presents for 1 month follow up for medication changes.   He is having bilateral swelling in bilateral feet, worse right leg than the left. He can lay flat, no SOB. He wears compression socks most days. He was started on lasix last visit with 5 lbs weight loss. States lasix was helping but feels like it is not helping as much.   Had CT AB with contrast 2016 Normal echo 2010 He follows with Dr. Karsten Ro and now follows with Dr. Gloriann Loan s/p surgery 09/2019. BMI is Body mass index is 27.42 kg/m., he is working on diet and exercise. Wt Readings from Last 3 Encounters:  06/01/20 205 lb (93 kg)  04/27/20 210 lb (95.3 kg)  01/16/20 199 lb 9.6 oz (90.5 kg)   Lab Results  Component Value Date   CREATININE 1.03 04/27/2020   BUN 20 04/27/2020   NA 135 04/27/2020   K 4.8 04/27/2020   CL 106 04/27/2020   CO2 25 04/27/2020   Normal urine 07/10/2019  Blood pressure 128/74, pulse 64, temperature (!) 97.3 F (36.3 C), weight 205 lb (93 kg), SpO2 96 %.  He  has a past surgical history that includes Cervical fusion (2011/2012); Knee arthroscopy; Septoplasty; Shoulder arthroscopy (Left, 2015); Shoulder surgery (Right, 08); Colonoscopy; Back surgery (2016 x 2 L3-L5, 12/2016 S1); and Shoulder arthroscopy with rotator cuff repair (Right, 08/18/2017).    Patient Active Problem List   Diagnosis Date Noted  . Benign prostatic hyperplasia with nocturia 10/10/2019  . Aortic atherosclerosis ([place]) 10/09/2019  . Gastroesophageal reflux disease without esophagitis 12/26/2018  . Prediabetes 12/26/2018  . Overweight (BMI 25.0-29.9) 01/23/2018  . COPD (chronic obstructive pulmonary disease) ([place]) 04/03/2017  . S/P lumbar spinal fusion 12/27/2016  . Medication management  09/09/2015  . Spondylolisthesis of lumbar region 04/28/2015  . Testosterone deficiency 11/06/2014  . Lumbar degenerative disc disease 09/23/2014  . Essential hypertension 08/13/2013  . Hyperlipidemia, mixed 08/13/2013  . Abnormal glucose 08/13/2013  . Reflux esophagitis 08/13/2013  . Vitamin D deficiency 08/13/2013  . Impotence of organic origin 08/13/2013  . Diverticulosis of large intestine 04/01/2008     Current Outpatient Medications (Endocrine & Metabolic):  .  testosterone cypionate (DEPOTESTOSTERONE CYPIONATE) 200 MG/ML injection, INJECT 2 MLS INTO MUSCLE EVERY 2 WEEKS  Current Outpatient Medications (Cardiovascular):  .  atorvastatin (LIPITOR) 80 MG tablet, TAKE 1 TABLET (80 MG TOTAL) BY MOUTH DAILY. FOR CHOLESTEROL .  furosemide (LASIX) 20 MG tablet, Take 1 tablet (20 mg total) by mouth daily. Marland Kitchen  olmesartan (BENICAR) 40 MG tablet, TAKE 1 TABLET BY MOUTH  DAILY FOR BLOOD PRESSURE .  sildenafil (VIAGRA) 100 MG tablet, Take 1 tablet (100 mg total) by mouth as needed for erectile dysfunction.  Current Outpatient Medications (Respiratory):  .  fluticasone (FLONASE) 50 MCG/ACT nasal spray, SPRAY 2 SPRAYS INTO EACH NOSTRIL EVERY DAY .  loratadine (CLARITIN) 10 MG tablet, Take 1 tablet (10 mg total) by mouth as needed for allergies.  Current Outpatient Medications (Analgesics):  .  diclofenac (VOLTAREN) 75 MG EC tablet, TAKE 1 TABLET 2 X /DAY WITH FOOD FOR PAIN & INFLAMMATION .  oxyCODONE (OXY IR/ROXICODONE) 5 MG immediate release tablet, Take 5 mg by mouth every 4 (four) hours as needed for severe pain.  Current Outpatient Medications (Hematological):  Marland Kitchen  vitamin B-12 (CYANOCOBALAMIN) 1000 MCG tablet, Take 1,000 mcg by mouth daily.  Current Outpatient Medications (Other):  Marland Kitchen  Cholecalciferol (VITAMIN D) 2000 UNITS tablet, Take 4,000 Units by mouth daily.  .  finasteride (PROSCAR) 5 MG tablet, TAKE 1 TABLET DAILY FOR PROSTATE .  Flaxseed, Linseed, (FLAX SEED OIL) 1000 MG CAPS,  Take 1 capsule (1,000 mg total) by mouth 2 (two) times daily. .  Magnesium 250 MG TABS, Take 250 mg by mouth daily. .  Needles & Syringes MISC, Use for Testosterone inject every 2 weeks. Marland Kitchen  omeprazole (PRILOSEC) 20 MG capsule, Take 1 capsule 2 x /day as needed for Indigestion &  Acid Reflux .  pregabalin (LYRICA) 100 MG capsule, TAKE 1 CAPSULE 3 TO 4 X /DAY FOR CHRONIC PAIN  Allergies  Allergen Reactions  . Other Hives    Beef-hives  RED MEAT  . Aspirin Hives and Rash  . Salicylates Rash    ROS: all negative except above.   Physical Exam: Filed Weights   06/01/20 1439  Weight: 205 lb (93 kg)   BP 128/74   Pulse 64   Temp (!) 97.3 F (36.3 C)   Wt 205 lb (93 kg)   SpO2 96%   BMI 27.42 kg/m  General Appearance: Well nourished, in no apparent distress. Eyes: PERRLA, EOMs, conjunctiva no swelling or erythema Sinuses: No Frontal/maxillary tenderness ENT/Mouth: Ext aud canals clear, TMs without erythema, bulging. No erythema, swelling, or exudate on post pharynx.  Tonsils not swollen or erythematous. Hearing normal.  Neck: Supple, thyroid normal.  Respiratory: Respiratory effort normal, BS equal bilaterally without rales, rhonchi, wheezing or stridor.  Cardio: RRR with no MRGs. Brisk peripheral pulses with 1-2 + edema.  Abdomen: Soft, + BS.  Non tender, no guarding, rebound, hernias, masses. Lymphatics: Non tender without lymphadenopathy.  Musculoskeletal: Full ROM, 5/5 strength, antalgic gait Skin: Warm, dry without rashes, lesions, ecchymosis.  Neuro: Cranial nerves intact. No cerebellar symptoms.  Psych: Awake and oriented X 3, normal affect, Insight and Judgment appropriate.  Vicie Mutters, PA-C 3:13 PM [facility]

## 2020-06-02 LAB — CBC WITH DIFFERENTIAL/PLATELET
Absolute Monocytes: 335 cells/uL (ref 200–950)
Basophils Absolute: 22 cells/uL (ref 0–200)
Basophils Relative: 0.6 %
Eosinophils Absolute: 173 cells/uL (ref 15–500)
Eosinophils Relative: 4.8 %
HCT: 41.4 % (ref 38.5–50.0)
Hemoglobin: 14.3 g/dL (ref 13.2–17.1)
Lymphs Abs: 1184 cells/uL (ref 850–3900)
MCH: 32.9 pg (ref 27.0–33.0)
MCHC: 34.5 g/dL (ref 32.0–36.0)
MCV: 95.2 fL (ref 80.0–100.0)
MPV: 11.2 fL (ref 7.5–12.5)
Monocytes Relative: 9.3 %
Neutro Abs: 1886 cells/uL (ref 1500–7800)
Neutrophils Relative %: 52.4 %
Platelets: 188 10*3/uL (ref 140–400)
RBC: 4.35 10*6/uL (ref 4.20–5.80)
RDW: 13 % (ref 11.0–15.0)
Total Lymphocyte: 32.9 %
WBC: 3.6 10*3/uL — ABNORMAL LOW (ref 3.8–10.8)

## 2020-06-02 LAB — COMPLETE METABOLIC PANEL WITH GFR
AG Ratio: 1.4 (calc) (ref 1.0–2.5)
ALT: 25 U/L (ref 9–46)
AST: 21 U/L (ref 10–35)
Albumin: 4 g/dL (ref 3.6–5.1)
Alkaline phosphatase (APISO): 86 U/L (ref 35–144)
BUN: 20 mg/dL (ref 7–25)
CO2: 27 mmol/L (ref 20–32)
Calcium: 9.6 mg/dL (ref 8.6–10.3)
Chloride: 106 mmol/L (ref 98–110)
Creat: 1.1 mg/dL (ref 0.70–1.25)
GFR, Est African American: 79 mL/min/{1.73_m2} (ref >=60)
GFR, Est Non African American: 68 mL/min/{1.73_m2} (ref >=60)
Globulin: 2.9 g/dL (calc) (ref 1.9–3.7)
Glucose, Bld: 102 mg/dL — ABNORMAL HIGH (ref 65–99)
Potassium: 4.7 mmol/L (ref 3.5–5.3)
Sodium: 138 mmol/L (ref 135–146)
Total Bilirubin: 0.4 mg/dL (ref 0.2–1.2)
Total Protein: 6.9 g/dL (ref 6.1–8.1)

## 2020-06-02 LAB — BRAIN NATRIURETIC PEPTIDE: Brain Natriuretic Peptide: 5 pg/mL (ref <100)

## 2020-06-02 LAB — MAGNESIUM: Magnesium: 2.3 mg/dL (ref 1.5–2.5)

## 2020-06-09 ENCOUNTER — Other Ambulatory Visit: Payer: Self-pay | Admitting: Internal Medicine

## 2020-07-08 DIAGNOSIS — M961 Postlaminectomy syndrome, not elsewhere classified: Secondary | ICD-10-CM | POA: Diagnosis not present

## 2020-07-08 DIAGNOSIS — G894 Chronic pain syndrome: Secondary | ICD-10-CM | POA: Diagnosis not present

## 2020-07-08 DIAGNOSIS — Z6825 Body mass index (BMI) 25.0-25.9, adult: Secondary | ICD-10-CM | POA: Diagnosis not present

## 2020-07-08 DIAGNOSIS — R030 Elevated blood-pressure reading, without diagnosis of hypertension: Secondary | ICD-10-CM | POA: Diagnosis not present

## 2020-07-31 DIAGNOSIS — G894 Chronic pain syndrome: Secondary | ICD-10-CM | POA: Diagnosis not present

## 2020-07-31 DIAGNOSIS — M5416 Radiculopathy, lumbar region: Secondary | ICD-10-CM | POA: Diagnosis not present

## 2020-07-31 DIAGNOSIS — M961 Postlaminectomy syndrome, not elsewhere classified: Secondary | ICD-10-CM | POA: Diagnosis not present

## 2020-08-08 ENCOUNTER — Encounter: Payer: Self-pay | Admitting: Internal Medicine

## 2020-08-08 NOTE — Progress Notes (Signed)
Comprehensive Evaluation & Examination      This very nice 69 y.o.  MBM presents for a comprehensive evaluation and management of multiple medical co-morbidities.  Patient has been followed for HTN, HLD, T2_NIDDM  Prediabetes and Vitamin D Deficiency.  Patient has GERD controlled on his Prilosec & Diet.  Patient is followed by Dr Gloriann Loan for [place]       Patient has chronic neck & LBP consequent of DDD/ DJD and 2 neck & 4 back surgeries with last L5-S1 Decompression /Fusion in 2018 by Dr Trenton Gammon.      HTN predates since 2013. Patient's BP has been controlled at home.  Today's BP: 136/72. Patient has hx/o Aortic Atheroscleroses by CT scan in 2016. Patient denies any cardiac symptoms as chest pain, palpitations, shortness of breath, dizziness or ankle swelling.      Patient's hyperlipidemia is controlled with diet and Atorvastatin Laverle Hobby. Patient denies myalgias or other medication SE's. Last lipids were at goal:  Lab Results  Component Value Date   CHOL 143 04/27/2020   HDL 42 04/27/2020   LDLCALC 79 04/27/2020   TRIG 122 04/27/2020   CHOLHDL 3.4 04/27/2020      Patient has hx/o Testosterone Deficiency & is on Replacement with improved sense of well-being & stamina.      Patient has Moderate Obesity (BMI 31.1+) and consequent  prediabetes (A1c 6.0% /2014) and patient denies reactive hypoglycemic symptoms, visual blurring, diabetic polys or paresthesias. Last A1c was near goal:   Lab Results  Component Value Date   HGBA1C 5.7 (H) 04/27/2020        Finally, patient has history of Vitamin D Deficiency and last vitamin D was at goal:   Lab Results  Component Value Date   VD25OH 63 01/16/2020    Current Outpatient Medications on File Prior to Visit  Medication Sig  . atorvastatin  80 MG t TAKE 1 TABLET DAILY  . VITAMIN D 2000 U Take 4,000 Units daily.   . diclofenac  75 MG EC  TAKE 1 TAB 2 X /DAY   . finasteride 5 MG tablet TAKE 1 TABLET DAILY  . Flaxseed 1000 MG CAPS  Take 1 capsule  2 times daily.  Marland Kitchen FLONASE nasal spray 2 SPRAYS  EACH NOSTRIL EVERY DAY  . furosemide  20 MG tablet Take 1 tablet  daily.  Marland Kitchen loratadine  10 MG tablet Take  as needed for allergies.  . Magnesium 250 MG TABS Take  daily.  Marland Kitchen olmesartan  40 MG tablet TAKE DAILY   . omeprazole  20 MG capsule Take     1 capsule      2 x /day   . oxyCODONE 5 MG IR Take every 4   hours as needd   . pregabalin  100 MG capsule Take 1 cap 3-4 x /day  . sildenafil  100 MG tablet Take 1 tablet  as needed   . testosterone cypio 200 mg/ml injec Inject 2 mls IM every  2 weeks  . vitamin B-12  1000 MCG tab Take daily.    Allergies  Allergen Reactions  . Beef - RED MEAT Hives  . Aspirin Hives and Rash  . Salicylates Rash    Past Medical History:  Diagnosis Date  . Allergy   . Anxiety  takes Valium daily as needed   . Arthritis  in neck   . BPH     takes Flomax daily  . Chronic back pain    DDD  .  COPD ([place])   . Diverticulosis   . Foot drop - both feet   . GERD (gastroesophageal reflux disease)    takes Omeprazole daily as well as Zantac  . Hyperlipidemia - takes Atorvastatin daily   . Hypertension   . Hypogonadism male   . Nocturia   . Numbness -only in Lt 4th &5th fingers and Lt leg    takes Claritin daily as needed and uses Flonase daily as needed  . Urinary frequency - takes Flomax daily   . Wears hearing aid 10/10/2019   Health Maintenance  Topic Date Due  . INFLUENZA VACCINE  04/12/2020  . TETANUS/TDAP  05/27/2024  . COLONOSCOPY  10/08/2028  . COVID-19 Vaccine  Completed  . Hepatitis C Screening  Completed  . PNA vac Low Risk Adult  Completed   Immunization History  Administered Date(s) Administered  . Influenza Split 06/18/2015  . Influenza, High Dose Seasonal PF 06/20/2016, 06/06/2017, 07/31/2018  . Influenza-Unspecified 08/17/2014, 07/10/2019  . PFIZER SARS-COV-2 Vaccination 10/18/2019, 11/08/2019  . PPD Test 05/27/2014  . Pneumococcal Conjugate-13 12/09/2015  .  Pneumococcal Polysaccharide-23 04/08/2013, 06/06/2017  . Td 09/13/2003  . Tdap 05/27/2014  . Zoster Recombinat (Shingrix) 11/29/2017, 02/08/2018   Last Colon -  10/08/2018 - Dr Loletha Carrow  -Diverticulosis - Recc no f/u due to age   Past Surgical History:  Procedure Date  . BACK SURGERY 2016 x 2 L3-L5, 12/2016 S1   lumbar fusion x 3  . CERVICAL FUSION 2011/2012  . COLONOSCOPY   . KNEE ARTHROSCOPY    right 1985; left 1992  . SEPTOPLASTY   . SHOULDER ARTHROSCOPY 2015  . SHOULDER ARTHROSCOPY WITH ROTATOR CUFF REPAIR 08/18/2017   Dr. Onnie Graham  . SHOULDER SURGERY 08   Family History  Problem Relation Age of Onset  . Leukemia Mother   . Diabetes Mother   . Hypertension Father   . Diabetes Father   . Colon cancer Neg Hx   . Stomach cancer Neg Hx   . Esophageal cancer Neg Hx   . Rectal cancer Neg Hx    Social History   Socioeconomic History  . Marital status: Married    Spouse name: Pricilla Holm  . Number of children:  2 children  Occupational History  . Not on file  Tobacco Use  . Smoking status: Former Smoker    Quit date: 05/28/1995    Years since quitting: 25.2  . Smokeless tobacco: Never Used  . Tobacco comment: quit smoking >20+yrs ago  Vaping Use  . Vaping Use: Never used  Substance and Sexual Activity  . Alcohol use: Not Currently    Alcohol/week: 0.0 standard drinks    Comment: rarely  . Drug use: No  . Sexual activity: Yes     ROS Constitutional: Denies fever, chills, weight loss/gain, headaches, insomnia,  night sweats or change in appetite. Does c/o fatigue. Eyes: Denies redness, blurred vision, diplopia, discharge, itchy or watery eyes.  ENT: Denies discharge, congestion, post nasal drip, epistaxis, sore throat, earache, hearing loss, dental pain, Tinnitus, Vertigo, Sinus pain or snoring.  Cardio: Denies chest pain, palpitations, irregular heartbeat, syncope, dyspnea, diaphoresis, orthopnea, PND, claudication, but c/o occasional dependent edema Respiratory: denies  cough, dyspnea, DOE, pleurisy, hoarseness, laryngitis or wheezing.  Gastrointestinal:  Reports occas dysphagia, Denies heartburn, reflux, water brash, pain, cramps, nausea, vomiting, bloating, diarrhea, constipation, hematemesis, melena, hematochezia, jaundice or hemorrhoids Genitourinary: Denies dysuria, frequency, discharge, hematuria or flank pain. Has urgency, nocturia x 2-3 & occasional hesitancy. Musculoskeletal: Denies arthralgia, myalgia, stiffness,  Jt. Swelling, pain, limp or strain/sprain. Denies Falls. Skin: Denies puritis, rash, hives, warts, acne, eczema or change in skin lesion Neuro: No weakness, tremor, incoordination, spasms, paresthesia or pain Psychiatric: Denies confusion, memory loss or sensory loss. Denies Depression. Endocrine: Denies change in weight, skin, hair change, nocturia, and paresthesia, diabetic polys, visual blurring or hyper / hypo glycemic episodes.  Heme/Lymph: No excessive bleeding, bruising or enlarged lymph nodes.  Physical Exam  BP 136/72   Pulse 67   Temp 97.6 F (36.4 C)   Ht 5\' 9"  (1.753 m)   Wt 210 lb (95.3 kg)   SpO2 96%   BMI 31.01 kg/m   General Appearance:   Over nourished and well groomed and in no apparent distress.  Eyes: PERRLA, EOMs, conjunctiva no swelling or erythema, normal fundi and vessels. Sinuses: No frontal/maxillary tenderness ENT/Mouth: EACs patent / TMs  nl. Nares clear without erythema, swelling, mucoid exudates. Oral hygiene is good. No erythema, swelling, or exudate. Tongue normal, non-obstructing. Tonsils not swollen or erythematous. Hearing normal.  Neck: Supple, thyroid not palpable. No bruits, nodes or JVD. Respiratory: Respiratory effort normal.  BS equal and clear bilateral without rales, rhonci, wheezing or stridor. Cardio: Heart sounds are normal with regular rate and rhythm and no murmurs, rubs or gallops. Peripheral pulses are normal and equal bilaterally without edema. No aortic or femoral bruits. Chest:  symmetric with normal excursions and percussion.  Abdomen: Soft, with Nl bowel sounds. Nontender, no guarding, rebound, hernias, masses, or organomegaly.  Lymphatics: Non tender without lymphadenopathy.  Musculoskeletal: Full ROM all peripheral extremities, joint stability, 5/5 strength, and normal gait. Skin: Warm and dry without rashes, lesions, cyanosis, clubbing or  ecchymosis.  Neuro: Cranial nerves intact, reflexes equal bilaterally. Normal muscle tone, no cerebellar symptoms. Sensation intact.  Pysch: Alert and oriented X 3 with normal affect, insight and judgment appropriate.   Assessment and Plan    1. Essential hypertension  - EKG 12-Lead - [place], RETROPERITNL ABD,  LTD - Urinalysis, Routine w reflex microscopic - Microalbumin / creatinine urine ratio - CBC with Differential/Platelet - COMPLETE METABOLIC PANEL WITH GFR - Magnesium - TSH  2. Hyperlipidemia, mixed  - EKG 12-Lead - [place], RETROPERITNL ABD,  LTD - Lipid panel - TSH  3. Abnormal glucose  - EKG 12-Lead - [place], RETROPERITNL ABD,  LTD - Hemoglobin A1c - Insulin, random  4. Vitamin D deficiency  - VITAMIN D 25 Hydroxy   5. Prediabetes  - EKG 12-Lead - [place], RETROPERITNL ABD,  LTD - Hemoglobin A1c - Insulin, random  6. Gastroesophageal reflux disease   - CBC with Differential/Platelet  7. Chronic pain syndrome   8. Testosterone deficiency  - Testosterone  9. Aortic atherosclerosis (HCC)  - EKG 12-Lead - [place], RETROPERITNL ABD,  LTD  10. BPH with obstruction/lower urinary tract symptoms  - PSA  11. Screening for colorectal cancer  - POC Hemoccult Bld/Stl  12. Prostate cancer screening   13. Screening for ischemic heart disease  - EKG 12-Lead - PSA  14. Family history of hypertension  - EKG 12-Lead - [place], RETROPERITNL ABD,  LTD  15. Former smoker  - EKG 12-Lead - [place], RETROPERITNL ABD,  LTD  16. Screening for AAA (aortic abdominal aneurysm)  - [place], RETROPERITNL ABD,  LTD  17.  Medication management  - Urinalysis, Routine w reflex microscopic - Microalbumin / creatinine urine ratio - Testosterone - CBC with Differential/Platelet - COMPLETE METABOLIC PANEL WITH GFR - Magnesium - Lipid panel - TSH - Hemoglobin  A1c - Insulin, random - VITAMIN D 25 Hydroxy         Patient was counseled in prudent diet, weight control to achieve/maintain BMI less than 25, BP monitoring, regular exercise and medications as discussed.  Discussed med effects and SE's. Advised to increase his Lasix 20 mg to 2 tabs = 40 mg /day for c/o intermittent dependent edema. Also, recommended increasing his Prilosec 20 mg bid to 2 caps = 40 mg bid for c/o mild dysphagia & what sounds like laryngeal reflux.   Routine screening labs and tests as requested with regular follow-up as recommended. Over 40 minutes of exam, counseling, chart review and high complex critical decision making was performed   Kirtland Bouchard, MD

## 2020-08-08 NOTE — Patient Instructions (Signed)

## 2020-08-10 ENCOUNTER — Other Ambulatory Visit: Payer: Self-pay

## 2020-08-10 ENCOUNTER — Encounter: Payer: Self-pay | Admitting: Internal Medicine

## 2020-08-10 ENCOUNTER — Ambulatory Visit (INDEPENDENT_AMBULATORY_CARE_PROVIDER_SITE_OTHER): Payer: Medicare Other | Admitting: Internal Medicine

## 2020-08-10 VITALS — BP 136/72 | HR 67 | Temp 97.6°F | Ht 69.0 in | Wt 210.0 lb

## 2020-08-10 DIAGNOSIS — Z79899 Other long term (current) drug therapy: Secondary | ICD-10-CM

## 2020-08-10 DIAGNOSIS — N401 Enlarged prostate with lower urinary tract symptoms: Secondary | ICD-10-CM | POA: Diagnosis not present

## 2020-08-10 DIAGNOSIS — E782 Mixed hyperlipidemia: Secondary | ICD-10-CM

## 2020-08-10 DIAGNOSIS — I10 Essential (primary) hypertension: Secondary | ICD-10-CM

## 2020-08-10 DIAGNOSIS — Z1211 Encounter for screening for malignant neoplasm of colon: Secondary | ICD-10-CM

## 2020-08-10 DIAGNOSIS — N138 Other obstructive and reflux uropathy: Secondary | ICD-10-CM

## 2020-08-10 DIAGNOSIS — G894 Chronic pain syndrome: Secondary | ICD-10-CM | POA: Diagnosis not present

## 2020-08-10 DIAGNOSIS — R7309 Other abnormal glucose: Secondary | ICD-10-CM | POA: Diagnosis not present

## 2020-08-10 DIAGNOSIS — E349 Endocrine disorder, unspecified: Secondary | ICD-10-CM

## 2020-08-10 DIAGNOSIS — K219 Gastro-esophageal reflux disease without esophagitis: Secondary | ICD-10-CM

## 2020-08-10 DIAGNOSIS — Z125 Encounter for screening for malignant neoplasm of prostate: Secondary | ICD-10-CM

## 2020-08-10 DIAGNOSIS — Z87891 Personal history of nicotine dependence: Secondary | ICD-10-CM

## 2020-08-10 DIAGNOSIS — I700 Atherosclerosis of aorta: Secondary | ICD-10-CM

## 2020-08-10 DIAGNOSIS — E559 Vitamin D deficiency, unspecified: Secondary | ICD-10-CM | POA: Diagnosis not present

## 2020-08-10 DIAGNOSIS — Z136 Encounter for screening for cardiovascular disorders: Secondary | ICD-10-CM | POA: Diagnosis not present

## 2020-08-10 DIAGNOSIS — Z8249 Family history of ischemic heart disease and other diseases of the circulatory system: Secondary | ICD-10-CM | POA: Diagnosis not present

## 2020-08-10 DIAGNOSIS — R7303 Prediabetes: Secondary | ICD-10-CM | POA: Diagnosis not present

## 2020-08-11 LAB — CBC WITH DIFFERENTIAL/PLATELET
Absolute Monocytes: 422 cells/uL (ref 200–950)
Basophils Absolute: 40 cells/uL (ref 0–200)
Basophils Relative: 1.2 %
Eosinophils Absolute: 162 cells/uL (ref 15–500)
Eosinophils Relative: 4.9 %
HCT: 42.2 % (ref 38.5–50.0)
Hemoglobin: 14.2 g/dL (ref 13.2–17.1)
Lymphs Abs: 1142 cells/uL (ref 850–3900)
MCH: 32.1 pg (ref 27.0–33.0)
MCHC: 33.6 g/dL (ref 32.0–36.0)
MCV: 95.3 fL (ref 80.0–100.0)
MPV: 10.8 fL (ref 7.5–12.5)
Monocytes Relative: 12.8 %
Neutro Abs: 1535 cells/uL (ref 1500–7800)
Neutrophils Relative %: 46.5 %
Platelets: 238 10*3/uL (ref 140–400)
RBC: 4.43 10*6/uL (ref 4.20–5.80)
RDW: 13 % (ref 11.0–15.0)
Total Lymphocyte: 34.6 %
WBC: 3.3 10*3/uL — ABNORMAL LOW (ref 3.8–10.8)

## 2020-08-11 LAB — MICROALBUMIN / CREATININE URINE RATIO
Creatinine, Urine: 288 mg/dL (ref 20–320)
Microalb Creat Ratio: 6 mcg/mg creat (ref <30)
Microalb, Ur: 1.7 mg/dL

## 2020-08-11 LAB — COMPLETE METABOLIC PANEL WITH GFR
AG Ratio: 1.2 (calc) (ref 1.0–2.5)
ALT: 21 U/L (ref 9–46)
AST: 19 U/L (ref 10–35)
Albumin: 3.8 g/dL (ref 3.6–5.1)
Alkaline phosphatase (APISO): 80 U/L (ref 35–144)
BUN: 16 mg/dL (ref 7–25)
CO2: 27 mmol/L (ref 20–32)
Calcium: 9.4 mg/dL (ref 8.6–10.3)
Chloride: 106 mmol/L (ref 98–110)
Creat: 1.19 mg/dL (ref 0.70–1.25)
GFR, Est African American: 72 mL/min/{1.73_m2} (ref >=60)
GFR, Est Non African American: 62 mL/min/{1.73_m2} (ref >=60)
Globulin: 3.1 g/dL (calc) (ref 1.9–3.7)
Glucose, Bld: 88 mg/dL (ref 65–99)
Potassium: 4.4 mmol/L (ref 3.5–5.3)
Sodium: 138 mmol/L (ref 135–146)
Total Bilirubin: 0.4 mg/dL (ref 0.2–1.2)
Total Protein: 6.9 g/dL (ref 6.1–8.1)

## 2020-08-11 LAB — URINALYSIS, ROUTINE W REFLEX MICROSCOPIC
Bacteria, UA: NONE SEEN /HPF
Bilirubin Urine: NEGATIVE
Glucose, UA: NEGATIVE
Hgb urine dipstick: NEGATIVE
Hyaline Cast: NONE SEEN /LPF
Nitrite: NEGATIVE
Specific Gravity, Urine: 1.025 (ref 1.001–1.03)
Squamous Epithelial / HPF: NONE SEEN /HPF (ref <=5)
pH: 5.5 (ref 5.0–8.0)

## 2020-08-11 LAB — LIPID PANEL
Cholesterol: 142 mg/dL (ref <200)
HDL: 36 mg/dL — ABNORMAL LOW (ref >=40)
LDL Cholesterol (Calc): 82 mg/dL (calc)
Non-HDL Cholesterol (Calc): 106 mg/dL (calc) (ref <130)
Total CHOL/HDL Ratio: 3.9 (calc) (ref <5.0)
Triglycerides: 143 mg/dL (ref <150)

## 2020-08-11 LAB — TSH: TSH: 5.64 mIU/L — ABNORMAL HIGH (ref 0.40–4.50)

## 2020-08-11 LAB — HEMOGLOBIN A1C
Hgb A1c MFr Bld: 5.6 % of total Hgb (ref <5.7)
Mean Plasma Glucose: 114 (calc)
eAG (mmol/L): 6.3 (calc)

## 2020-08-11 LAB — PSA: PSA: 1.12 ng/mL (ref <=4.0)

## 2020-08-11 LAB — MAGNESIUM: Magnesium: 2.1 mg/dL (ref 1.5–2.5)

## 2020-08-11 LAB — VITAMIN D 25 HYDROXY (VIT D DEFICIENCY, FRACTURES): Vit D, 25-Hydroxy: 65 ng/mL (ref 30–100)

## 2020-08-11 LAB — TESTOSTERONE: Testosterone: 853 ng/dL — ABNORMAL HIGH (ref 250–827)

## 2020-08-11 LAB — INSULIN, RANDOM: Insulin: 6.9 u[IU]/mL

## 2020-08-11 NOTE — Progress Notes (Signed)
========================================================== °-   Test results slightly outside the reference range are not unusual. If there is anything important, I will review this with you,  otherwise it is considered normal test values.  If you have further questions,  please do not hesitate to contact me at the office or via My Chart.  ==========================================================  -  PSA - Low - Great  ==========================================================  -  Testosterone level Normal & OK  ==========================================================  -  Total Chol = 142 and LDL 82 - Both  Excellent   - Very low risk for Heart Attack  / Stroke ==========================================================  - TSH  / Thyroid test is Borderline abnormal - so will monitor closely at  subsequent office visits ==========================================================  -  A1c - Normal - Great  - No Diabetes  ! ==========================================================  -  Vitamin D = 65 - Excellent  ==========================================================  -  All Else - CBC - Kidneys - Electrolytes - Liver  &  Magnesium   - all  Normal / OK ==========================================================   - Keep up the [place] Work !  ==========================================================

## 2020-08-17 ENCOUNTER — Ambulatory Visit (INDEPENDENT_AMBULATORY_CARE_PROVIDER_SITE_OTHER): Payer: Medicare Other | Admitting: Adult Health Nurse Practitioner

## 2020-08-17 ENCOUNTER — Encounter: Payer: Self-pay | Admitting: Adult Health Nurse Practitioner

## 2020-08-17 ENCOUNTER — Other Ambulatory Visit: Payer: Self-pay

## 2020-08-17 VITALS — BP 128/84 | HR 82 | Temp 97.3°F | Wt 210.0 lb

## 2020-08-17 DIAGNOSIS — N489 Disorder of penis, unspecified: Secondary | ICD-10-CM

## 2020-08-17 DIAGNOSIS — I10 Essential (primary) hypertension: Secondary | ICD-10-CM | POA: Diagnosis not present

## 2020-08-17 DIAGNOSIS — L989 Disorder of the skin and subcutaneous tissue, unspecified: Secondary | ICD-10-CM | POA: Diagnosis not present

## 2020-08-17 MED ORDER — TRIAMCINOLONE ACETONIDE 0.5 % EX OINT: 1.0000 "application " | TOPICAL_OINTMENT | Freq: Two times a day (BID) | CUTANEOUS | 0 refills | Status: DC

## 2020-08-17 NOTE — Progress Notes (Addendum)
Assessment and Plan:  Jacob Larsen was seen today for acute visit.  Diagnoses and all orders for this visit:  Skin lesion Penile lesion -     triamcinolone ointment (KENALOG) 0.5 %; Apply 1 application topically 2 (two) times daily. Monitor area Discussed care and monitoring Questionable HSV?  Essential hypertension Continue current medications: Monitor blood pressure at home; call if consistently over 130/80 Continue DASH diet.   Reminder to go to the ER if any CP, SOB, nausea, dizziness, severe HA, changes vision/speech, left arm numbness and tingling and jaw pain.       Further disposition pending results of labs. Discussed med's effects and SE's.   Over 30 minutes of face to face interview exam, counseling, chart review, and critical decision making was performed.   Future Appointments  Date Time Provider [place]  08/17/2020  9:00 AM Garnet Sierras, NP [facility] None  11/12/2020 11:30 AM Liane Comber, NP [facility] None  02/16/2021 10:30 AM Unk Pinto, MD [facility] None  08/23/2021  2:00 PM Unk Pinto, MD [facility] None    ------------------------------------------------------------------------------------------------------------------   HPI 69 y.o.male presents for evaluation of a sore on the skin of his genitals.  He reports that he used triamcinolone topical on the area that did not.  Reports that he has had an increased in stress in his life recently.  Reports he has had something similar in the past but it has ben six or seven years ago.  No pain, or burning or itching. Denies dysuria, hematuria.    Left shaft, non tender, no drainage noted.  Reports he is sexually active.  He has had different partner but no concerns of STD.  He uses a condom routinely.  Report his wife has dementia and she has started buy more douche than normal.  Reports she does not have any sores or open areas.     Past Medical History:  Diagnosis Date  . Allergy   .  Anxiety    takes Valium daily as needed  . Arthritis    in neck  . BPH (benign prostatic hyperplasia)    takes Flomax daily  . Chronic back pain    DDD  . COPD (chronic obstructive pulmonary disease) ([place])   . Diverticulosis   . Foot drop    both feet  . GERD (gastroesophageal reflux disease)    takes Omeprazole daily as well as Zantac  . Hyperlipidemia    takes Atorvastatin daily  . Hypertension   . Hypogonadism male   . Nocturia   . Numbness    only in 4th and 5th finger on left hand and left leg  . Seasonal allergies    takes Claritin daily as needed and uses Flonase daily as needed  . Urinary frequency    takes Flomax daily  . Wears hearing aid 10/10/2019     Allergies  Allergen Reactions  . Other Hives    Beef-hives  RED MEAT  . Aspirin Hives and Rash  . Salicylates Rash    Current Outpatient Medications on File Prior to Visit  Medication Sig  . atorvastatin (LIPITOR) 80 MG tablet TAKE 1 TABLET (80 MG TOTAL) BY MOUTH DAILY. FOR CHOLESTEROL  . Cholecalciferol (VITAMIN D) 2000 UNITS tablet Take 4,000 Units by mouth daily.   . diclofenac (VOLTAREN) 75 MG EC tablet TAKE 1 TABLET 2 X /DAY WITH FOOD FOR PAIN & INFLAMMATION  . Flaxseed, Linseed, (FLAX SEED OIL) 1000 MG CAPS Take 1 capsule (1,000 mg total) by mouth 2 (two)  times daily.  . fluticasone (FLONASE) 50 MCG/ACT nasal spray SPRAY 2 SPRAYS INTO EACH NOSTRIL EVERY DAY  . loratadine (CLARITIN) 10 MG tablet Take 1 tablet (10 mg total) by mouth as needed for allergies.  . Magnesium 250 MG TABS Take 250 mg by mouth daily.  . Needles & Syringes MISC Use for Testosterone inject every 2 weeks.  Marland Kitchen olmesartan (BENICAR) 40 MG tablet TAKE 1 TABLET BY MOUTH  DAILY FOR BLOOD PRESSURE  . omeprazole (PRILOSEC) 20 MG capsule Take     1 capsule      2 x /day       to Prevent Heartburn & Indigestion  . oxyCODONE (OXY IR/ROXICODONE) 5 MG immediate release tablet Take 5 mg by mouth every 4 (four) hours as needed for severe pain.  .  pregabalin (LYRICA) 100 MG capsule TAKE 1 CAPSULE 3 TO 4 X /DAY FOR CHRONIC PAIN  . sildenafil (VIAGRA) 100 MG tablet Take 1 tablet (100 mg total) by mouth as needed for erectile dysfunction.  Marland Kitchen testosterone cypionate (DEPOTESTOSTERONE CYPIONATE) 200 MG/ML injection INJECT 2 MLS INTO MUSCLE EVERY 2 WEEKS  . vitamin B-12 (CYANOCOBALAMIN) 1000 MCG tablet Take 1,000 mcg by mouth daily.   No current facility-administered medications on file prior to visit.    ROS: all negative except above.   Physical Exam:  There were no vitals taken for this visit.  General Appearance: Well nourished, in no apparent distress. Eyes: PERRLA, EOMs, conjunctiva no swelling or erythema Sinuses: No Frontal/maxillary tenderness ENT/Mouth: Ext aud canals clear, TMs without erythema, bulging. No erythema, swelling, or exudate on post pharynx.  Tonsils not swollen or erythematous. Hearing normal.  Neck: Supple, thyroid normal.  Respiratory: Respiratory effort normal, BS equal bilaterally without rales, rhonchi, wheezing or stridor.  Cardio: RRR with no MRGs. Brisk peripheral pulses without edema.  Abdomen: Soft, + BS.  Non tender, no guarding, rebound, hernias, masses. Lymphatics: Non tender without lymphadenopathy.  Musculoskeletal: Full ROM, 5/5 strength, normal gait.  Skin: Warm, dry without rashes, lesions, ecchymosis. Penile shaft, less than 0.5cm pink lesion, no drainage, non tender. Neuro: Cranial nerves intact. Normal muscle tone, no cerebellar symptoms. Sensation intact.  Psych: Awake and oriented X 3, normal affect, Insight and Judgment appropriate.     Garnet Sierras, NP 8:46 AM [facility]

## 2020-08-18 ENCOUNTER — Ambulatory Visit (INDEPENDENT_AMBULATORY_CARE_PROVIDER_SITE_OTHER): Payer: Medicare Other

## 2020-08-18 VITALS — Temp 97.5°F

## 2020-08-18 DIAGNOSIS — Z23 Encounter for immunization: Secondary | ICD-10-CM

## 2020-08-20 ENCOUNTER — Other Ambulatory Visit: Payer: Self-pay | Admitting: Internal Medicine

## 2020-08-20 ENCOUNTER — Other Ambulatory Visit: Payer: Self-pay

## 2020-08-20 DIAGNOSIS — Z1211 Encounter for screening for malignant neoplasm of colon: Secondary | ICD-10-CM

## 2020-08-20 DIAGNOSIS — Z1212 Encounter for screening for malignant neoplasm of rectum: Secondary | ICD-10-CM | POA: Diagnosis not present

## 2020-08-20 LAB — POC HEMOCCULT BLD/STL (HOME/3-CARD/SCREEN)
Card #2 Fecal Occult Blod, POC: NEGATIVE
Card #3 Fecal Occult Blood, POC: NEGATIVE
Fecal Occult Blood, POC: NEGATIVE

## 2020-08-21 DIAGNOSIS — Z9889 Other specified postprocedural states: Secondary | ICD-10-CM | POA: Diagnosis not present

## 2020-08-21 DIAGNOSIS — M961 Postlaminectomy syndrome, not elsewhere classified: Secondary | ICD-10-CM | POA: Diagnosis not present

## 2020-08-21 DIAGNOSIS — G894 Chronic pain syndrome: Secondary | ICD-10-CM | POA: Diagnosis not present

## 2020-08-21 HISTORY — PX: SPINAL CORD STIMULATOR IMPLANT: SHX2422

## 2020-09-21 ENCOUNTER — Other Ambulatory Visit: Payer: Self-pay | Admitting: Internal Medicine

## 2020-09-21 MED ORDER — OMEPRAZOLE 20 MG PO CPDR
DELAYED_RELEASE_CAPSULE | ORAL | 0 refills | Status: DC
Start: 2020-09-21 — End: 2020-11-27

## 2020-09-21 MED ORDER — FUROSEMIDE 40 MG PO TABS: ORAL_TABLET | ORAL | 0 refills | Status: DC

## 2020-09-24 ENCOUNTER — Other Ambulatory Visit: Payer: Self-pay | Admitting: Internal Medicine

## 2020-09-24 DIAGNOSIS — U071 COVID-19: Secondary | ICD-10-CM

## 2020-09-24 DIAGNOSIS — J069 Acute upper respiratory infection, unspecified: Secondary | ICD-10-CM

## 2020-09-24 MED ORDER — DEXAMETHASONE 4 MG PO TABS: ORAL_TABLET | ORAL | 0 refills | Status: DC

## 2020-09-24 MED ORDER — PSEUDOEPHEDRINE HCL ER 120 MG PO TB12: ORAL_TABLET | ORAL | 0 refills | Status: DC

## 2020-10-08 ENCOUNTER — Other Ambulatory Visit: Payer: Self-pay

## 2020-10-08 MED ORDER — DICLOFENAC SODIUM 75 MG PO TBEC
DELAYED_RELEASE_TABLET | ORAL | 1 refills | Status: DC
Start: 2020-10-08 — End: 2021-04-08

## 2020-10-26 ENCOUNTER — Other Ambulatory Visit: Payer: Self-pay

## 2020-10-26 ENCOUNTER — Ambulatory Visit: Payer: Medicare Other | Admitting: Internal Medicine

## 2020-10-26 ENCOUNTER — Encounter: Payer: Self-pay | Admitting: Internal Medicine

## 2020-10-26 VITALS — BP 106/62 | HR 92 | Temp 97.2°F | Resp 16 | Ht 72.0 in | Wt 202.6 lb

## 2020-10-26 DIAGNOSIS — B351 Tinea unguium: Secondary | ICD-10-CM | POA: Diagnosis not present

## 2020-10-26 DIAGNOSIS — M722 Plantar fascial fibromatosis: Secondary | ICD-10-CM | POA: Diagnosis not present

## 2020-10-26 MED ORDER — DEXAMETHASONE 4 MG PO TABS: ORAL_TABLET | ORAL | 0 refills | Status: DC

## 2020-10-26 NOTE — Patient Instructions (Signed)
Plantar Fasciitis  Plantar fasciitis is a painful foot condition that affects the heel. It occurs when the band of tissue that connects the toes to the heel bone (plantar fascia) becomes irritated. This can happen as the result of exercising too much or doing other repetitive activities (overuse injury). Plantar fasciitis can cause mild irritation to severe pain that makes it difficult to walk or move. The pain is usually worse in the morning after sleeping, or after sitting or lying down for a period of time. Pain may also be worse after long periods of walking or standing. What are the causes? This condition may be caused by:  Standing for long periods of time.  Wearing shoes that do not have good arch support.  Doing activities that put stress on joints (high-impact activities). This includes ballet and exercise that makes your heart beat faster (aerobic exercise), such as running.  Being overweight.  An abnormal way of walking (gait).  Tight muscles in the back of your lower leg (calf).  High arches in your feet or flat feet.  Starting a new athletic activity. What are the signs or symptoms? The main symptom of this condition is heel pain. Pain may get worse after the following:  Taking the first steps after a time of rest, especially in the morning after awakening, or after you have been sitting or lying down for a while.  Long periods of standing still. Pain may decrease after 30-45 minutes of activity, such as gentle walking. How is this diagnosed? This condition may be diagnosed based on your medical history, a physical exam, and your symptoms. Your health care provider will check for:  A tender area on the bottom of your foot.  A high arch in your foot or flat feet.  Pain when you move your foot.  Difficulty moving your foot. You may have imaging tests to confirm the diagnosis, such as:  X-rays.  Ultrasound.  MRI. How is this treated? Treatment for plantar  fasciitis depends on how severe your condition is. Treatment may include:  Rest, ice, pressure (compression), and raising (elevating) the affected foot. This is called RICE therapy. Your health care provider may recommend RICE therapy along with over-the-counter pain medicines to manage your pain.  Exercises to stretch your calves and your plantar fascia.  A splint that holds your foot in a stretched, upward position while you sleep (night splint).  Physical therapy to relieve symptoms and prevent problems in the future.  Injections of steroid medicine (cortisone) to relieve pain and inflammation.  Stimulating your plantar fascia with electrical impulses (extracorporeal shock wave therapy). This is usually the last treatment option before surgery.  Surgery, if other treatments have not worked after 12 months. Follow these instructions at home: Managing pain, stiffness, and swelling  If directed, put ice on the painful area. To do this: ? Put ice in a plastic bag, or use a frozen bottle of water. ? Place a towel between your skin and the bag or bottle. ? Roll the bottom of your foot over the bag or bottle. ? Do this for 20 minutes, 2-3 times a day.  Wear athletic shoes that have air-sole or gel-sole cushions, or try soft shoe inserts that are designed for plantar fasciitis.  Elevate your foot above the level of your heart while you are sitting or lying down.   Activity  Avoid activities that cause pain. Ask your health care provider what activities are safe for you.  Do physical therapy exercises   and stretches as told by your health care provider.  Try activities and forms of exercise that are easier on your joints (low impact). Examples include swimming, water aerobics, and biking. General instructions  Take over-the-counter and prescription medicines only as told by your health care provider.  Wear a night splint while sleeping, if told by your health care provider. Loosen the  splint if your toes tingle, become numb, or turn cold and blue.  Maintain a healthy weight, or work with your health care provider to lose weight as needed.  Keep all follow-up visits. This is important. Contact a health care provider if you have:  Symptoms that do not go away with home treatment.  Pain that gets worse.  Pain that affects your ability to move or do daily activities. Summary  Plantar fasciitis is a painful foot condition that affects the heel. It occurs when the band of tissue that connects the toes to the heel bone (plantar fascia) becomes irritated.  Heel pain is the main symptom of this condition. It may get worse after exercising too much or standing still for a long time.  Treatment varies, but it usually starts with rest, ice, pressure (compression), and raising (elevating) the affected foot. This is called RICE therapy. Over-the-counter medicines can also be used to manage pain. This information is not intended to replace advice given to you by your health care provider. Make sure you discuss any questions you have with your health care provider. Document Revised: 12/16/2019 Document Reviewed: 12/16/2019 Elsevier Patient Education  2021 [employer]  

## 2020-10-26 NOTE — Progress Notes (Signed)
palong the arch of his   History of Present Illness:      This very nice 70 y.o.  MBM  with  HTN, HLD, Prediabetes, GERD  and Vitamin D Deficiency presents with c/o pain along the arches of both feet  - Right worse than Left.     Patient has Chronic Back pain on maintenance Opioids and recently had a spinal stimulator implanted.  He does report good results with pain control from the stimulator.     Medications  .  testosterone cypio 200 MG/ML injection, Inject  2 ml into muscle every 2 weeks .  atorvastatin (LIPITOR) 80 MG, TAKE 1 TABLET  DAILY .  furosemide  40 MG tablet, Take 1 tablet Daily  .  olmesartan 40 MG tablet, TAKE 1 TABLET   DAILY  .  sildenafil  100 MG tablet, Take 1 tablet  as needed for erectile dysfunction. Marland Kitchen  FLONASE nasal spray,  2 SPRAYS INTO EACH NOSTRIL EVERY DAY .  loratadine (10 MG tablet, Take 1 tablet daily as needed for allergies. .  pseudoephedrine 120 MG 12 hr tablet, Take 1 tablet 2 x /day (every 12 hrs)        .  diclofenac 75 MG EC tablet, Take 1 tablet 2 x day with food   .  oxyCODONE (OXY IR/ROXICODONE) 5 MG immediate release tablet, Take 5 mg by mouth every 4 (four) hours as needed for severe pain.  .  vitamin B-12 1000 MCG tablet, Take  daily. Marland Kitchen  FLAX SEED OIL 1000 MG CAPS, Take 1 capsule  2  times daily. .  Magnesium 250 MG TABS, Take  daily. .  Needles & Syringes MISC, Use for Testosterone inject every 2 weeks. Marland Kitchen  omeprazole  20 MG capsule, Take 1 capsule 2 x /day      .  triamcinolone ointment  0.5 %, Apply 1 application topically 2  times daily. Marland Kitchen  VITAMIN D, Take 5,000 Units  daily.  Problem list He has Diverticulosis of large intestine; Essential hypertension; Hyperlipidemia, mixed; Abnormal glucose; Reflux esophagitis; Vitamin D deficiency; Impotence of organic origin; Lumbar degenerative disc disease; Testosterone deficiency; Spondylolisthesis of lumbar region; Medication management; S/P lumbar spinal fusion; COPD (chronic obstructive  pulmonary disease) ([place]); Overweight (BMI 25.0-29.9); Gastroesophageal reflux disease without esophagitis; Prediabetes; Aortic atherosclerosis ([place]); and Benign prostatic hyperplasia with nocturia on their problem list.   Observations/Objective:   BP 106/62   Pulse 92   Temp (!) 97.2 F (36.2 C)   Resp 16   Ht 6' (1.829 m)   Wt 202 lb 9.6 oz (91.9 kg)   SpO2 97%   BMI 27.48 kg/m   Bilateral foot exam finds nl pedal pulses bilaterally. Patient is noted to have medium arches bilaterally. His shoes have minimal to no arch support. He does appear to bilateral onychomycosis of the toenails worse Lt>Rt.  Assessment and Plan:  1. Plantar fasciitis of right foot  - dexamethasone  4 MG tablet; Take 1 tab 3 x day - 3 days, then 2 x day - 3 days, then 1 tab daily  Dispense: 20 tablet; Refill: 0  - Advised to purchase Dr Felicie Morn arch supports at [facility].    Follow Up Instructions:       Patient also requests Podiatry referral to assist with debridement  of his toenails due to difficulty positioning from his chronic back pain.      I discussed the assessment and treatment plan with the patient. The patient was provided  an opportunity to ask questions and all were answered. The patient agreed with the plan and demonstrated an understanding of the instructions.      The patient was advised to call back or seek an in-person evaluation if the symptoms worsen or if the condition fails to improve as anticipated.   Kirtland Bouchard, MD

## 2020-11-10 ENCOUNTER — Encounter: Payer: Self-pay | Admitting: Podiatry

## 2020-11-10 ENCOUNTER — Other Ambulatory Visit: Payer: Self-pay

## 2020-11-10 ENCOUNTER — Ambulatory Visit: Payer: Medicare Other | Admitting: Podiatry

## 2020-11-10 ENCOUNTER — Ambulatory Visit (INDEPENDENT_AMBULATORY_CARE_PROVIDER_SITE_OTHER): Payer: Medicare Other

## 2020-11-10 DIAGNOSIS — Z9689 Presence of other specified functional implants: Secondary | ICD-10-CM | POA: Insufficient documentation

## 2020-11-10 DIAGNOSIS — M778 Other enthesopathies, not elsewhere classified: Secondary | ICD-10-CM

## 2020-11-10 MED ORDER — GABAPENTIN 300 MG PO CAPS: 300.0000 mg | ORAL_CAPSULE | Freq: Three times a day (TID) | ORAL | 2 refills | Status: DC

## 2020-11-10 NOTE — Progress Notes (Signed)
Subjective:  Patient ID: Jacob Larsen, male    DOB: 1951-02-26,  MRN: [id number] HPI Chief Complaint  Patient presents with  . Foot Pain    Plantar forefoot/arch bilateral (R>L) - numbness, feeling of walking on a ball x 1-2 years, does have a lot of back problems, sensations used to be intermittent-more constant now, swelling-Rx'd furosemide, been elevating feet more in evenings  . Nail Problem    2nd and 3rd toenails left - thick and discolored  . New Patient (Initial Visit)    70 y.o. male presents with the above complaint.   ROS: Denies fever chills nausea vomiting muscle aches pains calf pain back pain chest pain shortness of breath.  Has had multiple back surgeries and has not and now had a stimulator in place.  He states that he still feels some radiating pain into his legs and feet but the majority of his problem is a wadded up sock sensation beneath his forefoot bilaterally.  Past Medical History:  Diagnosis Date  . Allergy   . Anxiety    takes Valium daily as needed  . Arthritis    in neck  . BPH (benign prostatic hyperplasia)    takes Flomax daily  . Chronic back pain    DDD  . COPD (chronic obstructive pulmonary disease) ([place])   . Diverticulosis   . Foot drop    both feet  . GERD (gastroesophageal reflux disease)    takes Omeprazole daily as well as Zantac  . Hyperlipidemia    takes Atorvastatin daily  . Hypertension   . Hypogonadism male   . Nocturia   . Numbness    only in 4th and 5th finger on left hand and left leg  . Seasonal allergies    takes Claritin daily as needed and uses Flonase daily as needed  . Urinary frequency    takes Flomax daily  . Wears hearing aid 10/10/2019   Past Surgical History:  Procedure Laterality Date  . BACK SURGERY  2016 x 2 L3-L5, 12/2016 S1   lumbar fusion x 3  . CERVICAL FUSION  2011/2012  . COLONOSCOPY    . KNEE ARTHROSCOPY     right 1985; left 1992  . SEPTOPLASTY    . SHOULDER ARTHROSCOPY Left 2015  .  SHOULDER ARTHROSCOPY WITH ROTATOR CUFF REPAIR Right 08/18/2017   Dr. Onnie Graham  . SHOULDER SURGERY Right 08    Current Outpatient Medications:  .  gabapentin (NEURONTIN) 300 MG capsule, Take 1 capsule (300 mg total) by mouth 3 (three) times daily., Disp: 90 capsule, Rfl: 2 .  atorvastatin (LIPITOR) 80 MG tablet, TAKE 1 TABLET (80 MG TOTAL) BY MOUTH DAILY. FOR CHOLESTEROL, Disp: 90 tablet, Rfl: 0 .  dexamethasone (DECADRON) 4 MG tablet, Take 1 tab 3 x day - 3 days, then 2 x day - 3 days, then 1 tab daily, Disp: 20 tablet, Rfl: 0 .  diclofenac (VOLTAREN) 75 MG EC tablet, Take 1 tablet 2 x day with food for pain & inflammation., Disp: 180 tablet, Rfl: 1 .  Flaxseed, Linseed, (FLAX SEED OIL) 1000 MG CAPS, Take 1 capsule (1,000 mg total) by mouth 2 (two) times daily., Disp: , Rfl: 0 .  fluticasone (FLONASE) 50 MCG/ACT nasal spray, SPRAY 2 SPRAYS INTO EACH NOSTRIL EVERY DAY, Disp: 48 mL, Rfl: 1 .  furosemide (LASIX) 40 MG tablet, Take 1 tablet Daily for BP and Fluid Retention / Ankle Swelling, Disp: 90 tablet, Rfl: 0 .  loratadine (CLARITIN) 10 MG tablet,  Take 1 tablet (10 mg total) by mouth as needed for allergies., Disp: 90 tablet, Rfl: 4 .  Magnesium 250 MG TABS, Take 250 mg by mouth daily., Disp: , Rfl:  .  Needles & Syringes MISC, Use for Testosterone inject every 2 weeks., Disp: 50 each, Rfl: 3 .  olmesartan (BENICAR) 40 MG tablet, TAKE 1 TABLET BY MOUTH  DAILY FOR BLOOD PRESSURE, Disp: 120 tablet, Rfl: 2 .  omeprazole (PRILOSEC) 20 MG capsule, Take     1 capsule      2 x /day       to Prevent Heartburn & Indigestion, Disp: 180 capsule, Rfl: 0 .  oxyCODONE (OXY IR/ROXICODONE) 5 MG immediate release tablet, Take 5 mg by mouth every 4 (four) hours as needed for severe pain., Disp: , Rfl:  .  pseudoephedrine (SUDAFED) 120 MG 12 hr tablet, Take      1 tablet      2 x /day (every 12 hours)          for Head and Chest Congestion, Disp: 60 tablet, Rfl: 0 .  sildenafil (VIAGRA) 100 MG tablet, Take 1 tablet  (100 mg total) by mouth as needed for erectile dysfunction., Disp: 30 tablet, Rfl: 1 .  testosterone cypionate (DEPOTESTOSTERONE CYPIONATE) 200 MG/ML injection, Inject      2 ml       into muscle        every 2 weeks, Disp: 12 mL, Rfl: 2 .  triamcinolone ointment (KENALOG) 0.5 %, Apply 1 application topically 2 (two) times daily., Disp: 30 g, Rfl: 0 .  vitamin B-12 (CYANOCOBALAMIN) 1000 MCG tablet, Take 1,000 mcg by mouth daily., Disp: , Rfl:  .  VITAMIN D PO, Take 5,000 Units by mouth daily., Disp: , Rfl:   Allergies  Allergen Reactions  . Other Hives    Beef-hives  RED MEAT  . Aspirin Hives and Rash  . Salicylates Rash   Review of Systems Objective:  There were no vitals filed for this visit.  General: Well developed, nourished, in no acute distress, alert and oriented x3   Dermatological: Skin is warm, dry and supple bilateral. Nails x 10 are well maintained; remaining integument appears unremarkable at this time. There are no open sores, no preulcerative lesions, no rash or signs of infection present.  Vascular: Dorsalis Pedis artery and Posterior Tibial artery pedal pulses are 2/4 bilateral with immedate capillary fill time. Pedal hair growth present. No varicosities and no lower extremity edema present bilateral.   Neruologic: Grossly intact via light touch bilateral. Vibratory intact via tuning fork bilateral. Protective threshold with Semmes Wienstein monofilament intact to all pedal sites bilateral. Patellar and Achilles deep tendon reflexes 2+ bilateral. No Babinski or clonus noted bilateral.   Musculoskeletal: No gross boney pedal deformities bilateral. No pain, crepitus, or limitation noted with foot and ankle range of motion bilateral. Muscular strength 5/5 in all groups tested bilateral.  No reproducible pain on palpation.  Gait: Unassisted, Nonantalgic.    Radiographs:  Radiographs taken today do not demonstrate any type of osseous abnormalities in this area.  No  acute findings are noted.  Assessment & Plan:   Assessment: Neuropathy most likely associated with chronic back pain.  Plan: Started him on gabapentin 300 mg 3 times a day to see if this makes any difference whatsoever I expressed to him that this may be something he does has to live with since his back surgeries and his stimulator are final.  He understands this  and is amenable to it he is going to follow-up with Dr.Galaway for routine nail debridement.  I will follow-up with him in 1 month for med check.     Jaber Dunlow T. [place], [place]

## 2020-11-11 ENCOUNTER — Encounter: Payer: Self-pay | Admitting: Adult Health

## 2020-11-11 NOTE — Progress Notes (Deleted)
Patient ID: Jacob Larsen, male   DOB: Jan 11, 1951, 70 y.o.   MRN: [id number]  MEDICARE ANNUAL WELLNESS VISIT AND FOLLOW UP Assessment:   Encounter for annual medicare wellness visit  Atherosclerosis of aorta Per CT 05/2015 Control blood pressure, cholesterol, glucose, increase exercise.   Essential hypertension - continue medications, DASH diet, exercise and monitor at home. Call if greater than 130/80.  -     CBC with Differential/Platelet -     CMP/GFR -     TSH  Chronic obstructive pulmonary disease, unspecified COPD type ([place]) Avoid triggers, monitor. Per CXR 2018.   Reflux esophagitis/GERD Continue PPI/H2 blocker, diet discussed  Lumbar degenerative disc disease Continue follow up  Mixed hyperlipidemia -     Lipid panel -continue medications, check lipids, decrease fatty foods, increase activity.   Abnormal glucose Recent A1Cs at goal Discussed diet/exercise, weight management  Defer A1C; check CMP  Medication management -     Magnesium  Vitamin D deficiency Continue supplement  Testosterone deficiency - continue replacement therapy, check testosterone levels as needed.   S/P lumbar spinal fusion Continue follow up  Diverticulosis of large intestine without hemorrhage Increase fiber, no symptoms at this time UTD colonoscopy next due 09/2028  Impotence of organic origin Continue follow up, current medications via urology   BPH with nocturia Continue medications; managed by [facility]  Spondylolisthesis of lumbar region Continue follow up  Overweight Long discussion about weight loss, diet, and exercise Recommended diet heavy in fruits and veggies and low in animal meats, cheeses, and dairy products, appropriate calorie intake Discussed appropriate weight for height  and initial goal (<200) Follow up at next visit   Over 30 minutes of exam, counseling, chart review, and critical decision making was performed Future Appointments  Date Time  Provider [place]  11/12/2020 11:30 AM Liane Comber, NP [facility] None  12/15/2020 11:00 AM [place], Bennie Pierini T, DPM [facility]  02/16/2021 10:30 AM Unk Pinto, MD [facility] None  02/22/2021  2:45 PM Marzetta Board, DPM [facility]  08/23/2021  2:00 PM Unk Pinto, MD [facility] None  11/15/2021 11:30 AM Liane Comber, NP [facility] None    Plan:   During the course of the visit the patient was educated and counseled about appropriate screening and preventive services including:    Pneumococcal vaccine   Influenza vaccine  Prevnar 13  Td vaccine  Screening electrocardiogram  Colorectal cancer screening  Diabetes screening  Glaucoma screening  Nutrition counseling    Subjective:  Jacob Larsen is a 70 y.o. AA male who presents for Medicare Annual Wellness Visit and 3 month follow up for HTN, hyperlipidemia, prediabetes, and vitamin D Def.   He had rezium water vapor therapy procedure for BPH at [facility] by Dr. Gloriann Loan on 10/07/2019, still has foley will return to have removed tomorrow. ***  He is managed by Dr. Mallie Mussel Pool/Dr. Luan Pulling for chronic lumbar pain, s/p cervical fusion in 2011 and 2012, and lumbar fusion x 3 in 2016 x 2 and in 2018 - S1 - L3, and is prescribed pain medication through that clinic. Prescribed lyria 100 mg and oxy 5 mg IR QID PRN (recently estimates 2 tabs/day), had steroid injection on 1/19 and feeling better. ***  Remote former smoker. He has COPD by imaging, asymptomatic off of inhalers.   BMI is There is no height or weight on file to calculate BMI., he has been working on diet, admits no intentional exercise but generally active around the house for  50% of the day, when back allows.  Wt Readings from Last 3 Encounters:  10/26/20 202 lb 9.6 oz (91.9 kg)  08/17/20 210 lb (95.3 kg)  08/10/20 210 lb (95.3 kg)   He has aortic atherosclerosis per CT 05/2015.  His blood pressure has been controlled at home,  today their BP is   He does not workout. He denies chest pain, shortness of breath, dizziness.    He is on cholesterol medication (atorvastatin 40 mg three days a week) and denies myalgias. His cholesterol is at goal. The cholesterol last visit was:   Lab Results  Component Value Date   CHOL 142 08/10/2020   HDL 36 (L) 08/10/2020   LDLCALC 82 08/10/2020   TRIG 143 08/10/2020   CHOLHDL 3.9 08/10/2020   He has been working on diet and exercise for glucose management (hx of prediabetes A1C 6.0% in 2015, 5.7% in 04/2020), and denies foot ulcerations, hyperglycemia, hypoglycemia , increased appetite, nausea, polydipsia, polyuria, visual disturbances, vomiting and weight loss. Last A1C in the office was:  Lab Results  Component Value Date   HGBA1C 5.6 08/10/2020   Last GFR Lab Results  Component Value Date   GFRAA 72 08/10/2020   Patient is on Vitamin D supplement.   Lab Results  Component Value Date   VD25OH 63 08/10/2020     He has a history of testosterone deficiency and is on testosterone replacement, last shot was yesterday, he injects 400 mg at home q 20 days.  He states that the testosterone helps with his energy, libido, muscle mass. He has ED and prescribed sildenafil 20 mg PRN which does help.  He has BPH with nocturia followed by Dr. Karsten Ro, on finasteride and tamsulosin.  Lab Results  Component Value Date   TESTOSTERONE 853 (H) 08/10/2020     Medication Review: Current Outpatient Medications on File Prior to Visit  Medication Sig Dispense Refill  . atorvastatin (LIPITOR) 80 MG tablet TAKE 1 TABLET (80 MG TOTAL) BY MOUTH DAILY. FOR CHOLESTEROL 90 tablet 0  . dexamethasone (DECADRON) 4 MG tablet Take 1 tab 3 x day - 3 days, then 2 x day - 3 days, then 1 tab daily 20 tablet 0  . diclofenac (VOLTAREN) 75 MG EC tablet Take 1 tablet 2 x day with food for pain & inflammation. 180 tablet 1  . Flaxseed, Linseed, (FLAX SEED OIL) 1000 MG CAPS Take 1 capsule (1,000 mg total) by  mouth 2 (two) times daily.  0  . fluticasone (FLONASE) 50 MCG/ACT nasal spray SPRAY 2 SPRAYS INTO EACH NOSTRIL EVERY DAY 48 mL 1  . furosemide (LASIX) 40 MG tablet Take 1 tablet Daily for BP and Fluid Retention / Ankle Swelling 90 tablet 0  . gabapentin (NEURONTIN) 300 MG capsule Take 1 capsule (300 mg total) by mouth 3 (three) times daily. 90 capsule 2  . loratadine (CLARITIN) 10 MG tablet Take 1 tablet (10 mg total) by mouth as needed for allergies. 90 tablet 4  . Magnesium 250 MG TABS Take 250 mg by mouth daily.    . Needles & Syringes MISC Use for Testosterone inject every 2 weeks. 50 each 3  . olmesartan (BENICAR) 40 MG tablet TAKE 1 TABLET BY MOUTH  DAILY FOR BLOOD PRESSURE 120 tablet 2  . omeprazole (PRILOSEC) 20 MG capsule Take     1 capsule      2 x /day       to Prevent Heartburn & Indigestion 180 capsule 0  .  oxyCODONE (OXY IR/ROXICODONE) 5 MG immediate release tablet Take 5 mg by mouth every 4 (four) hours as needed for severe pain.    . pseudoephedrine (SUDAFED) 120 MG 12 hr tablet Take      1 tablet      2 x /day (every 12 hours)          for Head and Chest Congestion 60 tablet 0  . sildenafil (VIAGRA) 100 MG tablet Take 1 tablet (100 mg total) by mouth as needed for erectile dysfunction. 30 tablet 1  . testosterone cypionate (DEPOTESTOSTERONE CYPIONATE) 200 MG/ML injection Inject      2 ml       into muscle        every 2 weeks 12 mL 2  . triamcinolone ointment (KENALOG) 0.5 % Apply 1 application topically 2 (two) times daily. 30 g 0  . vitamin B-12 (CYANOCOBALAMIN) 1000 MCG tablet Take 1,000 mcg by mouth daily.    Marland Kitchen VITAMIN D PO Take 5,000 Units by mouth daily.     No current facility-administered medications on file prior to visit.    Current Problems (verified) Patient Active Problem List   Diagnosis Date Noted  . Spinal cord stimulator status 11/10/2020  . Chronic pain syndrome 07/08/2020  . Opioid dependence ([place]) 05/11/2020  . Benign prostatic hyperplasia with nocturia  10/10/2019  . Aortic atherosclerosis ([place]) 10/09/2019  . Cervical fusion syndrome 05/23/2019  . Lumbar post-laminectomy syndrome 05/23/2019  . Gastroesophageal reflux disease without esophagitis 12/26/2018  . Prediabetes 12/26/2018  . Overweight (BMI 25.0-29.9) 01/23/2018  . Pain in joint of right shoulder 10/02/2017  . Encounter for orthopedic follow-up care 09/26/2017  . COPD (chronic obstructive pulmonary disease) ([place]) 04/03/2017  . S/P lumbar spinal fusion 12/27/2016  . Displacement of lumbar intervertebral disc without myelopathy 10/27/2016  . Medication management 09/09/2015  . Spondylolisthesis of lumbar region 04/28/2015  . Testosterone deficiency 11/06/2014  . Backache 10/21/2014  . Lumbar degenerative disc disease 09/23/2014  . Carpal tunnel syndrome 08/23/2013  . Brachial radiculitis 08/14/2013  . Essential hypertension 08/13/2013  . Hyperlipidemia, mixed 08/13/2013  . Abnormal glucose 08/13/2013  . Vitamin D deficiency 08/13/2013  . Impotence of organic origin 08/13/2013  . Neck pain 05/20/2013  . Diverticulosis of large intestine 04/01/2008    Screening Tests Immunization History  Administered Date(s) Administered  . Influenza Split 06/18/2015  . Influenza, High Dose Seasonal PF 06/20/2016, 06/06/2017, 07/31/2018, 08/18/2020  . Influenza-Unspecified 08/17/2014, 07/10/2019  . PFIZER(Purple Top)SARS-COV-2 Vaccination 10/18/2019, 11/08/2019  . PPD Test 05/27/2014  . Pneumococcal Conjugate-13 12/09/2015  . Pneumococcal Polysaccharide-23 04/08/2013, 06/06/2017  . Td 09/13/2003  . Tdap 05/27/2014  . Zoster Recombinat (Shingrix) 11/29/2017, 02/08/2018    Preventative care: Last colonoscopy: 09/2018, due 10 years  Prior vaccinations: TD or Tdap: 2015  Influenza: 08/2020 Pneumococcal: 2014, 2018 Prevnar13: 2017 Shingrix: 2/2, 2019 Covid 19: ***  Names of Other Physician/Practitioners you currently use: 1. [facility]  here for primary care 2. Dr. Sabra Heck, eye doctor, last visit June 2019, every 2 years 3. Dr. Rona Ravens, dentist, last visit 2020, goes q65m  Patient Care Team: Unk Pinto, MD as PCP - General (Internal Medicine) Inda Castle, MD (Inactive) as Consulting Physician (Gastroenterology) Kathie Rhodes, MD (Inactive) as Consulting Physician (Urology) Barbaraann Cao, OD as Referring Physician (Optometry) Druscilla Brownie, MD as Consulting Physician (Dermatology) Earnie Larsson, MD as Consulting Physician (Neurosurgery)  Allergies Allergies  Allergen Reactions  . Other Hives    Beef-hives  RED  MEAT  . Aspirin Hives and Rash  . Salicylates Rash    SURGICAL HISTORY He  has a past surgical history that includes Cervical fusion (2011/2012); Knee arthroscopy; Septoplasty; Shoulder arthroscopy (Left, 2015); Shoulder surgery (Right, 08); Colonoscopy; Back surgery (2016 x 2 L3-L5, 12/2016 S1); and Shoulder arthroscopy with rotator cuff repair (Right, 08/18/2017). FAMILY HISTORY His family history includes Diabetes in his father and mother; Hypertension in his father; Leukemia in his mother. SOCIAL HISTORY He  reports that he quit smoking about 25 years ago. He has never used smokeless tobacco. He reports previous alcohol use. He reports that he does not use drugs.  MEDICARE WELLNESS OBJECTIVES: Physical activity:   Cardiac risk factors:   Depression/mood screen:   Depression screen [facility] 2/9 08/08/2020  Decreased Interest 0  Down, Depressed, Hopeless 0  PHQ - 2 Score 0    ADLs:  In your present state of health, do you have any difficulty performing the following activities: 08/08/2020 01/15/2020  Hearing? Y N  Comment bilat hearing aids -  Vision? N N  Difficulty concentrating or making decisions? N N  Walking or climbing stairs? N N  Dressing or bathing? N N  Doing errands, shopping? N N  Some recent data might be hidden     Cognitive Testing  Alert? Yes  Normal  Appearance?Yes  Oriented to person? Yes  Place? Yes   Time? Yes  Recall of three objects?  3/3  Can perform simple calculations? Yes  Displays appropriate judgment?Yes  Can read the correct time from a watch face?Yes  EOL planning:     Objective:   There were no vitals filed for this visit. There is no height or weight on file to calculate BMI.  General appearance: alert, no distress, WD/WN, male HEENT: normocephalic, sclerae anicteric, TMs pearly, nares patent, no discharge or erythema, pharynx normal Oral cavity: MMM, no lesions Neck: supple, no lymphadenopathy, no thyromegaly, no masses Heart: RRR, normal S1, S2, no murmurs Lungs: CTA bilaterally, no wheezes, rhonchi, or rales Abdomen: +bs, soft, non tender, non distended, no masses, no hepatomegaly, no splenomegaly Musculoskeletal: nontender, no swelling, no obvious deformity Extremities: scant non-pitting pedal edema on R, no cyanosis, no clubbing*** Pulses: 2+ symmetric, upper and lower extremities, normal cap refill Neurological: alert, oriented x 3, CN2-12 intact, strength normal upper extremities and lower extremities, sensation normal throughout, DTRs 2+ throughout, no cerebellar signs, gait normal Psychiatric: normal affect, behavior normal, pleasant   Medicare Attestation I have personally reviewed: The patient's medical and social history Their use of alcohol, tobacco or illicit drugs Their current medications and supplements The patient's functional ability including ADLs,fall risks, home safety risks, cognitive, and hearing and visual impairment Diet and physical activities Evidence for depression or mood disorders  The patient's weight, height, BMI, and visual acuity have been recorded in the chart.  I have made referrals, counseling, and provided education to the patient based on review of the above and I have provided the patient with a written personalized care plan for preventive services.     Izora Ribas, NP   11/11/2020

## 2020-11-12 ENCOUNTER — Other Ambulatory Visit: Payer: Self-pay

## 2020-11-12 ENCOUNTER — Ambulatory Visit (INDEPENDENT_AMBULATORY_CARE_PROVIDER_SITE_OTHER): Payer: Medicare Other | Admitting: Adult Health

## 2020-11-12 ENCOUNTER — Encounter: Payer: Self-pay | Admitting: Adult Health

## 2020-11-12 VITALS — BP 128/76 | HR 72 | Temp 97.9°F | Wt 197.0 lb

## 2020-11-12 DIAGNOSIS — R7303 Prediabetes: Secondary | ICD-10-CM

## 2020-11-12 DIAGNOSIS — M5136 Other intervertebral disc degeneration, lumbar region: Secondary | ICD-10-CM | POA: Diagnosis not present

## 2020-11-12 DIAGNOSIS — E782 Mixed hyperlipidemia: Secondary | ICD-10-CM

## 2020-11-12 DIAGNOSIS — R351 Nocturia: Secondary | ICD-10-CM

## 2020-11-12 DIAGNOSIS — Z0001 Encounter for general adult medical examination with abnormal findings: Secondary | ICD-10-CM

## 2020-11-12 DIAGNOSIS — Z0000 Encounter for general adult medical examination without abnormal findings: Secondary | ICD-10-CM

## 2020-11-12 DIAGNOSIS — J449 Chronic obstructive pulmonary disease, unspecified: Secondary | ICD-10-CM | POA: Diagnosis not present

## 2020-11-12 DIAGNOSIS — R6889 Other general symptoms and signs: Secondary | ICD-10-CM

## 2020-11-12 DIAGNOSIS — F1120 Opioid dependence, uncomplicated: Secondary | ICD-10-CM

## 2020-11-12 DIAGNOSIS — K219 Gastro-esophageal reflux disease without esophagitis: Secondary | ICD-10-CM | POA: Diagnosis not present

## 2020-11-12 DIAGNOSIS — Z79899 Other long term (current) drug therapy: Secondary | ICD-10-CM

## 2020-11-12 DIAGNOSIS — E349 Endocrine disorder, unspecified: Secondary | ICD-10-CM

## 2020-11-12 DIAGNOSIS — I10 Essential (primary) hypertension: Secondary | ICD-10-CM

## 2020-11-12 DIAGNOSIS — R7309 Other abnormal glucose: Secondary | ICD-10-CM

## 2020-11-12 DIAGNOSIS — E559 Vitamin D deficiency, unspecified: Secondary | ICD-10-CM

## 2020-11-12 DIAGNOSIS — I700 Atherosclerosis of aorta: Secondary | ICD-10-CM

## 2020-11-12 DIAGNOSIS — N401 Enlarged prostate with lower urinary tract symptoms: Secondary | ICD-10-CM

## 2020-11-12 DIAGNOSIS — K5730 Diverticulosis of large intestine without perforation or abscess without bleeding: Secondary | ICD-10-CM

## 2020-11-12 DIAGNOSIS — E663 Overweight: Secondary | ICD-10-CM

## 2020-11-12 DIAGNOSIS — G894 Chronic pain syndrome: Secondary | ICD-10-CM

## 2020-11-12 MED ORDER — LORATADINE 10 MG PO TABS: 10.0000 mg | ORAL_TABLET | ORAL | 3 refills | Status: DC | PRN

## 2020-11-12 MED ORDER — ATORVASTATIN CALCIUM 40 MG PO TABS: ORAL_TABLET | ORAL | 3 refills | Status: DC

## 2020-11-12 NOTE — Patient Instructions (Addendum)
  Jacob Larsen , Thank you for taking time to come for your Medicare Wellness Visit. I appreciate your ongoing commitment to your health goals. Please review the following plan we discussed and let me know if I can assist you in the future.   These are the goals we discussed: Goals    . Exercise 150 min/wk Moderate Activity     Start slow and increase gradually as tolerated;        This is a list of the screening recommended for you and due dates:  Health Maintenance  Topic Date Due  . Tetanus Vaccine  05/27/2024  . Colon Cancer Screening  10/08/2028  . Flu Shot  Completed  . COVID-19 Vaccine  Completed  .  Hepatitis C: One time screening is recommended by Center for Disease Control  (CDC) for  adults born from [birth year] through 1966.   Completed  . Pneumonia vaccines  Completed  . HPV Vaccine  Aged Out      Once out of atorvastatin - message back - will send in 40 mg tabs so you don't have to cut med in half anymore.

## 2020-11-12 NOTE — Progress Notes (Signed)
Patient ID: Jacob Larsen, male   DOB: Aug 23, 1951, 70 y.o.   MRN: [id number]  MEDICARE ANNUAL WELLNESS VISIT AND FOLLOW UP Assessment:   Encounter for annual medicare wellness visit  Atherosclerosis of aorta Per CT 05/2015 Control blood pressure, cholesterol, glucose, increase exercise.   Essential hypertension - continue medications, DASH diet, exercise and monitor at home. Call if greater than 130/80.  -     CBC with Differential/Platelet -     CMP/GFR -     TSH  Chronic obstructive pulmonary disease, unspecified COPD type ([place]) Avoid triggers, monitor. Per CXR 2018.   Reflux esophagitis/GERD Continue PPI/H2 blocker, diet discussed  Lumbar degenerative disc disease Continue follow up  Mixed hyperlipidemia -     Lipid panel -continue medications, check lipids, decrease fatty foods, increase activity.   Abnormal glucose Recent A1Cs at goal Discussed diet/exercise, weight management  Defer A1C; check CMP  Medication management -     Magnesium  Vitamin D deficiency Continue supplement  Testosterone deficiency - continue replacement therapy, check testosterone levels as needed.   S/P lumbar spinal fusion Continue follow up  Diverticulosis of large intestine without hemorrhage Increase fiber, no symptoms at this time UTD colonoscopy next due 09/2028  Impotence of organic origin Continue follow up, current medications via urology   BPH with nocturia Off of meds s/p vapor procedure; managed by [facility]  Spondylolisthesis of lumbar region Continue follow up  BMI 26 Continue to recommend diet heavy in fruits and veggies and low in animal meats, cheeses, and dairy products, appropriate calorie intake Discuss exercise recommendations routinely Continue to monitor weight at each visit    Over 30 minutes of exam, counseling, chart review, and critical decision making was performed Future Appointments  Date Time Provider [place]  12/15/2020 11:00  AM [place], [place] T, [place] [facility]  02/16/2021 10:30 AM Unk Pinto, MD [facility] None  02/22/2021  2:45 PM Marzetta Board, DPM [facility]  08/23/2021  2:00 PM Unk Pinto, MD [facility] None  11/15/2021 11:30 AM Liane Comber, NP [facility] None    Plan:   During the course of the visit the patient was educated and counseled about appropriate screening and preventive services including:    Pneumococcal vaccine   Influenza vaccine  Prevnar 13  Td vaccine  Screening electrocardiogram  Colorectal cancer screening  Diabetes screening  Glaucoma screening  Nutrition counseling    Subjective:  Jacob Larsen is a 70 y.o. AA male who presents for Medicare Annual Wellness Visit and 3 month follow up for HTN, hyperlipidemia, prediabetes, and vitamin D Def.   He is managed by Dr. Mallie Mussel Pool/Dr. Luan Pulling for chronic lumbar pain, s/p cervical fusion in 2011 and 2012, and lumbar fusion x 3 in 2016 x 2 and in 2018 - S1 - L3, and is prescribed pain medication through that clinic. Recently on gabapentin 300 mg TID PRN and oxy 5 mg IR QID PRN (recently estimates 2 tabs/day), had spinal stimulator implanted 08/21/2020 by Dr. Blair Hailey and has noted improvement. Discussing starting PT.   Remote former smoker. He has COPD by imaging, asymptomatic off of inhalers.   BMI is Body mass index is 26.72 kg/m., he has been working on diet, admits no intentional exercise but generally active around the house for 50% of the day, when back allows. Can walk short distances, takes dog out. Will rice a bicycle occasionally.  Wt Readings from Last 3 Encounters:  11/12/20 197 lb (89.4 kg)  10/26/20 202  lb 9.6 oz (91.9 kg)  08/17/20 210 lb (95.3 kg)   He has aortic atherosclerosis per CT 05/2015.  His blood pressure has been controlled at home, today their BP is BP: 128/76 He does not workout. He denies chest pain, shortness of breath, dizziness.    He is on cholesterol  medication (atorvastatin 40 mg three days a week) and denies myalgias. His cholesterol is at goal. The cholesterol last visit was:   Lab Results  Component Value Date   CHOL 142 08/10/2020   HDL 36 (L) 08/10/2020   LDLCALC 82 08/10/2020   TRIG 143 08/10/2020   CHOLHDL 3.9 08/10/2020   He has been working on diet and exercise for glucose management (hx of prediabetes A1C 6.0% in 2015, 5.7% in 04/2020), and denies foot ulcerations, hyperglycemia, hypoglycemia , increased appetite, nausea, polydipsia, polyuria, visual disturbances, vomiting and weight loss. Last A1C in the office was:  Lab Results  Component Value Date   HGBA1C 5.6 08/10/2020   Last GFR Lab Results  Component Value Date   GFRAA 72 08/10/2020   Patient is on Vitamin D supplement.   Lab Results  Component Value Date   VD25OH 55 08/10/2020     He has a history of testosterone deficiency and is on testosterone replacement, last shot was 4 days ago, he injects 400 mg at home q 20 days.  He states that the testosterone helps with his energy, libido, muscle mass. He has ED and prescribed sildenafil 20 mg PRN which does help.  He had rezium water vapor therapy procedure for BPH at [facility] by Dr. Gloriann Loan on 10/07/2019 and dong well since, off of all meds.  Lab Results  Component Value Date   TESTOSTERONE 853 (H) 08/10/2020     Medication Review: Current Outpatient Medications on File Prior to Visit  Medication Sig Dispense Refill  . diclofenac (VOLTAREN) 75 MG EC tablet Take 1 tablet 2 x day with food for pain & inflammation. 180 tablet 1  . Flaxseed, Linseed, (FLAX SEED OIL) 1000 MG CAPS Take 1 capsule (1,000 mg total) by mouth 2 (two) times daily.  0  . fluticasone (FLONASE) 50 MCG/ACT nasal spray SPRAY 2 SPRAYS INTO EACH NOSTRIL EVERY DAY 48 mL 1  . furosemide (LASIX) 40 MG tablet Take 1 tablet Daily for BP and Fluid Retention / Ankle Swelling 90 tablet 0  . gabapentin (NEURONTIN) 300 MG capsule Take 1 capsule  (300 mg total) by mouth 3 (three) times daily. 90 capsule 2  . Magnesium 250 MG TABS Take 250 mg by mouth daily.    Marland Kitchen olmesartan (BENICAR) 40 MG tablet TAKE 1 TABLET BY MOUTH  DAILY FOR BLOOD PRESSURE 120 tablet 2  . omeprazole (PRILOSEC) 20 MG capsule Take     1 capsule      2 x /day       to Prevent Heartburn & Indigestion 180 capsule 0  . oxyCODONE (OXY IR/ROXICODONE) 5 MG immediate release tablet Take 5 mg by mouth every 4 (four) hours as needed for severe pain.    . pseudoephedrine (SUDAFED) 120 MG 12 hr tablet Take      1 tablet      2 x /day (every 12 hours)          for Head and Chest Congestion 60 tablet 0  . sildenafil (VIAGRA) 100 MG tablet Take 1 tablet (100 mg total) by mouth as needed for erectile dysfunction. 30 tablet 1  . testosterone cypionate (DEPOTESTOSTERONE CYPIONATE)  200 MG/ML injection Inject      2 ml       into muscle        every 2 weeks 12 mL 2  . vitamin B-12 (CYANOCOBALAMIN) 1000 MCG tablet Take 1,000 mcg by mouth daily.    Marland Kitchen VITAMIN D PO Take 5,000 Units by mouth daily.    . Needles & Syringes MISC Use for Testosterone inject every 2 weeks. 50 each 3   No current facility-administered medications on file prior to visit.    Current Problems (verified) Patient Active Problem List   Diagnosis Date Noted  . Spinal cord stimulator in place 08/21/2020 11/10/2020  . Chronic pain syndrome 07/08/2020  . Opioid dependence ([place]) 05/11/2020  . Benign prostatic hyperplasia with nocturia 10/10/2019  . Aortic atherosclerosis ([place]) 10/09/2019  . Cervical fusion syndrome 05/23/2019  . Lumbar post-laminectomy syndrome 05/23/2019  . Gastroesophageal reflux disease without esophagitis 12/26/2018  . Overweight (BMI 25.0-29.9) 01/23/2018  . COPD (chronic obstructive pulmonary disease) ([place]) 04/03/2017  . S/P lumbar spinal fusion 12/27/2016  . Medication management 09/09/2015  . Spondylolisthesis of lumbar region 04/28/2015  . Testosterone deficiency 11/06/2014  . Lumbar  degenerative disc disease 09/23/2014  . Essential hypertension 08/13/2013  . Hyperlipidemia, mixed 08/13/2013  . Abnormal glucose (hx of prediabetes) 08/13/2013  . Vitamin D deficiency 08/13/2013  . Impotence of organic origin 08/13/2013  . Diverticulosis of large intestine 04/01/2008    Screening Tests Immunization History  Administered Date(s) Administered  . Influenza Split 06/18/2015  . Influenza, High Dose Seasonal PF 06/20/2016, 06/06/2017, 07/31/2018, 08/18/2020  . Influenza-Unspecified 08/17/2014, 07/10/2019  . PFIZER(Purple Top)SARS-COV-2 Vaccination 10/18/2019, 11/08/2019, 06/26/2020  . PPD Test 05/27/2014  . Pneumococcal Conjugate-13 12/09/2015  . Pneumococcal Polysaccharide-23 04/08/2013, 06/06/2017  . Td 09/13/2003  . Tdap 05/27/2014  . Zoster Recombinat (Shingrix) 11/29/2017, 02/08/2018    Preventative care: Last colonoscopy: 09/2018, due 10 years  Prior vaccinations: TD or Tdap: 2015  Influenza: 08/2020 Pneumococcal: 2014, 2018 Prevnar13: 2017 Shingrix: 2/2, 2019 Covid 19: 3/3, 2021, pfizer - booster information requested  Names of Other Physician/Practitioners you currently use: 1. [facility] here for primary care 2. Dr. Sabra Heck, eye doctor, last visit June 2019, every 2 years, will  3. Dr. Rona Ravens, dentist, last visit 2022, goes q75m  Patient Care Team: Unk Pinto, MD as PCP - General (Internal Medicine) Inda Castle, MD (Inactive) as Consulting Physician (Gastroenterology) Kathie Rhodes, MD (Inactive) as Consulting Physician (Urology) Barbaraann Cao, OD as Referring Physician (Optometry) Druscilla Brownie, MD as Consulting Physician (Dermatology) Earnie Larsson, MD as Consulting Physician (Neurosurgery)  Allergies Allergies  Allergen Reactions  . Other Hives    Beef-hives  RED MEAT  . Aspirin Hives and Rash  . Salicylates Rash    SURGICAL HISTORY He  has a past surgical history that includes  Cervical fusion (2011/2012); Knee arthroscopy; Septoplasty; Shoulder arthroscopy (Left, 2015); Shoulder surgery (Right, 08); Colonoscopy; Back surgery (2016 x 2 L3-L5, 12/2016 S1); Shoulder arthroscopy with rotator cuff repair (Right, 08/18/2017); Spinal cord stimulator implant (08/21/2020); and Prostate surgery (10/07/2019). FAMILY HISTORY His family history includes Diabetes in his father and mother; Hypertension in his father; Leukemia in his mother. SOCIAL HISTORY He  reports that he quit smoking about 25 years ago. He has never used smokeless tobacco. He reports previous alcohol use. He reports that he does not use drugs.  MEDICARE WELLNESS OBJECTIVES: Physical activity: Current Exercise Habits: The patient does not participate in regular exercise at present, Exercise  limited by: orthopedic condition(s) Cardiac risk factors: Cardiac Risk Factors include: advanced age (>55men, >28 women);dyslipidemia;hypertension;male gender;sedentary lifestyle;smoking/ tobacco exposure Depression/mood screen:   Depression screen [facility] 2/9 11/12/2020  Decreased Interest 0  Down, Depressed, Hopeless 0  PHQ - 2 Score 0    ADLs:  In your present state of health, do you have any difficulty performing the following activities: 11/12/2020 08/08/2020  Hearing? N Y  Comment has hearing aids, upcoming appointment bilat hearing aids  Vision? N N  Difficulty concentrating or making decisions? N N  Walking or climbing stairs? N N  Dressing or bathing? N N  Doing errands, shopping? N N  Some recent data might be hidden     Cognitive Testing  Alert? Yes  Normal Appearance?Yes  Oriented to person? Yes  Place? Yes   Time? Yes  Recall of three objects?  3/3  Can perform simple calculations? Yes  Displays appropriate judgment?Yes  Can read the correct time from a watch face?Yes  EOL planning: Does Patient Have a Medical Advance Directive?: Yes Type of Advance Directive: [place] will Does  patient want to make changes to medical advance directive?: No - Patient declined Copy of [place] in Chart?: No - copy requested   Objective:   Today's Vitals   11/12/20 1133  BP: 128/76  Pulse: 72  Temp: 97.9 F (36.6 C)  SpO2: 94%  Weight: 197 lb (89.4 kg)   Body mass index is 26.72 kg/m.  General appearance: alert, no distress, WD/WN, male HEENT: normocephalic, sclerae anicteric, TMs pearly, nares patent, no discharge or erythema, pharynx normal Oral cavity: MMM, no lesions Neck: supple, no lymphadenopathy, no thyromegaly, no masses Heart: RRR, normal S1, S2, no murmurs Lungs: CTA bilaterally, no wheezes, rhonchi, or rales Abdomen: +bs, soft, non tender, non distended, no masses, no hepatomegaly, no splenomegaly Musculoskeletal: nontender, no swelling, no obvious deformity Extremities: no edema, no cyanosis, no clubbing Pulses: 2+ symmetric, upper and lower extremities, normal cap refill Neurological: alert, oriented x 3, CN2-12 intact, strength normal upper extremities and lower extremities, sensation normal throughout, DTRs 2+ throughout, no cerebellar signs, gait normal Psychiatric: normal affect, behavior normal, pleasant   Medicare Attestation I have personally reviewed: The patient's medical and social history Their use of alcohol, tobacco or illicit drugs Their current medications and supplements The patient's functional ability including ADLs,fall risks, home safety risks, cognitive, and hearing and visual impairment Diet and physical activities Evidence for depression or mood disorders  The patient's weight, height, BMI, and visual acuity have been recorded in the chart.  I have made referrals, counseling, and provided education to the patient based on review of the above and I have provided the patient with a written personalized care plan for preventive services.     Izora Ribas, NP   11/12/2020

## 2020-11-13 LAB — CBC WITH DIFFERENTIAL/PLATELET
Absolute Monocytes: 396 cells/uL (ref 200–950)
Basophils Absolute: 28 cells/uL (ref 0–200)
Basophils Relative: 0.6 %
Eosinophils Absolute: 207 cells/uL (ref 15–500)
Eosinophils Relative: 4.5 %
HCT: 42.1 % (ref 38.5–50.0)
Hemoglobin: 14.5 g/dL (ref 13.2–17.1)
Lymphs Abs: 1072 cells/uL (ref 850–3900)
MCH: 31.9 pg (ref 27.0–33.0)
MCHC: 34.4 g/dL (ref 32.0–36.0)
MCV: 92.5 fL (ref 80.0–100.0)
MPV: 10.5 fL (ref 7.5–12.5)
Monocytes Relative: 8.6 %
Neutro Abs: 2898 cells/uL (ref 1500–7800)
Neutrophils Relative %: 63 %
Platelets: 243 10*3/uL (ref 140–400)
RBC: 4.55 10*6/uL (ref 4.20–5.80)
RDW: 13.4 % (ref 11.0–15.0)
Total Lymphocyte: 23.3 %
WBC: 4.6 10*3/uL (ref 3.8–10.8)

## 2020-11-13 LAB — COMPLETE METABOLIC PANEL WITH GFR
AG Ratio: 1.3 (calc) (ref 1.0–2.5)
ALT: 24 U/L (ref 9–46)
AST: 17 U/L (ref 10–35)
Albumin: 4 g/dL (ref 3.6–5.1)
Alkaline phosphatase (APISO): 82 U/L (ref 35–144)
BUN: 22 mg/dL (ref 7–25)
CO2: 26 mmol/L (ref 20–32)
Calcium: 9.5 mg/dL (ref 8.6–10.3)
Chloride: 105 mmol/L (ref 98–110)
Creat: 1.09 mg/dL (ref 0.70–1.18)
GFR, Est African American: 79 mL/min/{1.73_m2} (ref >=60)
GFR, Est Non African American: 68 mL/min/{1.73_m2} (ref >=60)
Globulin: 3 g/dL (calc) (ref 1.9–3.7)
Glucose, Bld: 91 mg/dL (ref 65–99)
Potassium: 4.8 mmol/L (ref 3.5–5.3)
Sodium: 138 mmol/L (ref 135–146)
Total Bilirubin: 0.6 mg/dL (ref 0.2–1.2)
Total Protein: 7 g/dL (ref 6.1–8.1)

## 2020-11-13 LAB — LIPID PANEL
Cholesterol: 154 mg/dL (ref <200)
HDL: 40 mg/dL (ref >=40)
LDL Cholesterol (Calc): 92 mg/dL (calc)
Non-HDL Cholesterol (Calc): 114 mg/dL (calc) (ref <130)
Total CHOL/HDL Ratio: 3.9 (calc) (ref <5.0)
Triglycerides: 121 mg/dL (ref <150)

## 2020-11-13 LAB — MAGNESIUM: Magnesium: 2.3 mg/dL (ref 1.5–2.5)

## 2020-11-13 LAB — TSH: TSH: 3.15 mIU/L (ref 0.40–4.50)

## 2020-11-27 ENCOUNTER — Other Ambulatory Visit: Payer: Self-pay | Admitting: Internal Medicine

## 2020-12-15 ENCOUNTER — Ambulatory Visit: Payer: Medicare Other | Admitting: Podiatry

## 2020-12-15 ENCOUNTER — Other Ambulatory Visit: Payer: Self-pay

## 2020-12-15 ENCOUNTER — Encounter: Payer: Self-pay | Admitting: Podiatry

## 2020-12-15 DIAGNOSIS — G5793 Unspecified mononeuropathy of bilateral lower limbs: Secondary | ICD-10-CM | POA: Diagnosis not present

## 2020-12-15 MED ORDER — GABAPENTIN 600 MG PO TABS: 600.0000 mg | ORAL_TABLET | Freq: Three times a day (TID) | ORAL | 3 refills | Status: DC

## 2020-12-15 NOTE — Progress Notes (Signed)
He presents today for follow-up of his neuropathy he states that they may be a little bit better as he refers to the numbness and tingling in his toes.  Objective: No change in physical exam pulses remain palpable.  Assessment: Neuropathy.  Plan: We will increase his gabapentin to from 300 mg to 600 mg 3 times a day.  We will watch for sleepiness and I will follow-up with him in 1 month.

## 2020-12-16 ENCOUNTER — Other Ambulatory Visit: Payer: Self-pay | Admitting: Internal Medicine

## 2021-01-19 ENCOUNTER — Ambulatory Visit: Payer: Medicare Other | Admitting: Podiatry

## 2021-02-15 ENCOUNTER — Encounter: Payer: Self-pay | Admitting: Internal Medicine

## 2021-02-15 NOTE — Patient Instructions (Signed)

## 2021-02-15 NOTE — Progress Notes (Signed)
Future Appointments  Date Time Provider [place]  02/16/2021 10:30 AM Unk Pinto, MD [facility] None  08/23/2021 - CPE  2:00 PM Unk Pinto, MD [facility] None  11/15/2021 - Wellness 11:30 AM Liane Comber, NP [facility] None    History of Present Illness:       This very nice 70 y.o. MBM presents for 6 month follow up with HTN, HLD, Pre-Diabetes and Vitamin D Deficiency. Abd CT scan in Sept 2016 showed Aortic Atherosclerosis. Patient has GERD controlled on his meds.                                           Patient has a chronic pain syndrome consequent of 2 neck & 4 back surgeries with last L5-S1 Decompression /Fusion in 2018 by Dr Trenton Gammon. He is followed in pain mgmt and is on maintenance Opioids.   Last year he had a spinal cord stimulator implanted.        Patient is treated for HTN & BP has been controlled at home. Today's BP is at goal -  110/70. Patient has had no complaints of any cardiac type chest pain, palpitations, dyspnea / orthopnea / PND, dizziness, claudication, or dependent edema.       Hyperlipidemia is controlled with diet & meds. Patient denies myalgias or other med SE's. Last Lipids were at goal:   Lab Results  Component Value Date   CHOL 154 11/12/2020   HDL 40 11/12/2020   LDLCALC 92 11/12/2020   TRIG 121 11/12/2020   CHOLHDL 3.9 11/12/2020     Also, the patient has history of modertate obesity (BMI  27.53 ) and  PreDiabetes (A1c 6.0% /2014) and has had no symptoms of reactive hypoglycemia, diabetic polys, paresthesias or visual blurring.  Last A1c was normal & at goal:  Lab Results  Component Value Date   HGBA1C 5.6 08/10/2020   Wt Readings from Last 3 Encounters:  02/16/21 203 lb (92.1 kg)  11/12/20 197 lb (89.4 kg)  10/26/20 202 lb 9.6 oz (91.9 kg)             Further, the patient also has history of Vitamin D Deficiency and supplements vitamin D without any suspected side-effects. Last vitamin D was at goal:  Lab Results   Component Value Date   VD25OH 65 08/10/2020      Patient has Low T & is on parenteral replacement therapy with improved sense of stamina & well being.    Current Outpatient Medications on File Prior to Visit  Medication Sig  . atorvastatin (LIPITOR) 40 MG tablet TAKE 1 TABLET 3 DAYS A WEEK FOR CHOLESTEROL.  Marland Kitchen diclofenac (VOLTAREN) 75 MG EC tablet Take 1 tablet 2 x day with food for pain & inflammation.  . Flaxseed, Linseed, (FLAX SEED OIL) 1000 MG CAPS Take 1 capsule (1,000 mg total) by mouth 2 (two) times daily.  . fluticasone (FLONASE) 50 MCG/ACT nasal spray SPRAY 2 SPRAYS INTO EACH NOSTRIL EVERY DAY  . furosemide (LASIX) 40 MG tablet TAKE 1 TABLET DAILY FOR BLOOD PRESSURE AND FLUID RETENTION / ANKLE SWELLING  . gabapentin (NEURONTIN) 600 MG tablet Take 1 tablet (600 mg total) by mouth 3 (three) times daily.  Marland Kitchen loratadine (CLARITIN) 10 MG tablet Take 1 tablet (10 mg total) by mouth as needed for allergies.  . Magnesium 250 MG TABS Take 250 mg by mouth daily.  Marland Kitchen  Needles & Syringes MISC Use for Testosterone inject every 2 weeks.  Marland Kitchen olmesartan (BENICAR) 40 MG tablet TAKE 1 TABLET BY MOUTH  DAILY FOR BLOOD PRESSURE  . omeprazole (PRILOSEC) 20 MG capsule TAKE 1 CAPSULE BY MOUTH  TWICE DAILY AS NEEDED FOR  INDIGESTION AND ACID REFLUX  . oxyCODONE (OXY IR/ROXICODONE) 5 MG immediate release tablet Take 5 mg by mouth every 4 (four) hours as needed for severe pain.  Marland Kitchen testosterone cypionate (DEPOTESTOSTERONE CYPIONATE) 200 MG/ML injection Inject      2 ml       into muscle        every 2 weeks  . vitamin B-12 (CYANOCOBALAMIN) 1000 MCG tablet Take 1,000 mcg by mouth daily.  Marland Kitchen VITAMIN D PO Take 5,000 Units by mouth daily.  . pseudoephedrine (SUDAFED) 120 MG 12 hr tablet Take      1 tablet      2 x /day (every 12 hours)          for Head and Chest Congestion (Patient not taking: Reported on 02/16/2021)  . sildenafil (VIAGRA) 100 MG tablet Take 1 tablet (100 mg total) by mouth as needed for erectile  dysfunction.   No current facility-administered medications on file prior to visit.     Allergies  Allergen Reactions  . Other Hives    Beef-hives     RED MEAT  . Aspirin Hives and Rash  . Salicylates Rash     PMHx:   Past Medical History:  Diagnosis Date  . Allergy   . Anxiety    takes Valium daily as needed  . Arthritis    in neck  . BPH (benign prostatic hyperplasia)    takes Flomax daily  . Brachial radiculitis 08/14/2013  . Chronic back pain    DDD  . COPD (chronic obstructive pulmonary disease) ([place])   . Diverticulosis   . Foot drop    both feet  . GERD (gastroesophageal reflux disease)    takes Omeprazole daily as well as Zantac  . Hyperlipidemia    takes Atorvastatin daily  . Hypertension   . Hypogonadism male   . Nocturia   . Numbness    only in 4th and 5th finger on left hand and left leg  . Prediabetes 12/26/2018  . Reflux esophagitis 08/13/2013  . Seasonal allergies    takes Claritin daily as needed and uses Flonase daily as needed  . Urinary frequency    takes Flomax daily  . Wears hearing aid 10/10/2019     Immunization History  Administered Date(s) Administered  . Influenza Split 06/18/2015  . Influenza, High Dose  06/20/2016, 06/06/2017, 07/31/2018, 08/18/2020  . Influenza- 08/17/2014, 07/10/2019  . PFIZER SARS-COV-2 Vacci 10/18/2019, 11/08/2019, 06/26/2020  . PPD Test 05/27/2014  . Pneumococcal -13 12/09/2015  . Pneumococcal -23 04/08/2013, 06/06/2017  . Td 09/13/2003  . Tdap 05/27/2014  . Zoster Recombinat (Shingrix) 11/29/2017, 02/08/2018     Past Surgical History:  Procedure Laterality Date  . BACK SURGERY  2016 x 2 L3-L5, 12/2016 S1   lumbar fusion x 3  . CERVICAL FUSION  2011/2012  . COLONOSCOPY    . KNEE ARTHROSCOPY     right 1985; left 1992  . PROSTATE SURGERY  10/07/2019   Rezium water vapor procedure by Dr. Gloriann Loan  . SEPTOPLASTY    . SHOULDER ARTHROSCOPY Left 2015  . SHOULDER ARTHROSCOPY WITH ROTATOR CUFF REPAIR Right  08/18/2017   Dr. Onnie Graham  . SHOULDER SURGERY Right 08  . SPINAL  CORD STIMULATOR IMPLANT  08/21/2020   Dr. Blair Hailey    FHx:    Reviewed / unchanged  SHx:    Reviewed / unchanged   Systems Review:  Constitutional: Denies fever, chills, wt changes, headaches, insomnia, fatigue, night sweats, change in appetite. Eyes: Denies redness, blurred vision, diplopia, discharge, itchy, watery eyes.  ENT: Denies discharge, congestion, post nasal drip, epistaxis, sore throat, earache, hearing loss, dental pain, tinnitus, vertigo, sinus pain, snoring.  CV: Denies chest pain, palpitations, irregular heartbeat, syncope, dyspnea, diaphoresis, orthopnea, PND, claudication or edema. Respiratory: denies cough, dyspnea, DOE, pleurisy, hoarseness, laryngitis, wheezing.  Gastrointestinal: Denies dysphagia, odynophagia, heartburn, reflux, water brash, abdominal pain or cramps, nausea, vomiting, bloating, diarrhea, constipation, hematemesis, melena, hematochezia  or hemorrhoids. Genitourinary: Denies dysuria, frequency, urgency, nocturia, hesitancy, discharge, hematuria or flank pain. Musculoskeletal: Denies arthralgias, myalgias, stiffness, jt. swelling, pain, limping or strain/sprain.  Skin: Denies pruritus, rash, hives, warts, acne, eczema or change in skin lesion(s). Neuro: No weakness, tremor, incoordination, spasms, paresthesia or pain. Psychiatric: Denies confusion, memory loss or sensory loss. Endo: Denies change in weight, skin or hair change.  Heme/Lymph: No excessive bleeding, bruising or enlarged lymph nodes.  Physical Exam  BP 110/70 (BP Location: Right Arm, Patient Position: Sitting, Cuff Size: Large)   Pulse 68   Temp 97.9 F (36.6 C)   Resp 16   Ht 6' (1.829 m)   Wt 203 lb (92.1 kg)   SpO2 96%   BMI 27.53 kg/m   Appears  Over nourished, well groomed  and in no distress.  Eyes: PERRLA, EOMs, conjunctiva no swelling or erythema. Sinuses: No frontal/maxillary tenderness ENT/Mouth:  EAC's clear, TM's nl w/o erythema, bulging. Nares clear w/o erythema, swelling, exudates. Oropharynx clear without erythema or exudates. Oral hygiene is good. Tongue normal, non obstructing. Hearing intact.  Neck: Supple. Thyroid not palpable. Car 2+/2+ without bruits, nodes or JVD. Chest: Respirations nl with BS clear & equal w/o rales, rhonchi, wheezing or stridor.  Cor: Heart sounds normal w/ regular rate and rhythm without sig. murmurs, gallops, clicks or rubs. Peripheral pulses normal and equal  without edema.  Abdomen: Soft & bowel sounds normal. Non-tender w/o guarding, rebound, hernias, masses or organomegaly.  Lymphatics: Unremarkable.  Musculoskeletal: Full ROM all peripheral extremities, joint stability, 5/5 strength and normal gait.  Skin: Warm, dry without exposed rashes, lesions or ecchymosis apparent.  Neuro: Cranial nerves intact, reflexes equal bilaterally. Sensory-motor testing grossly intact. Tendon reflexes grossly intact.  Pysch: Alert & oriented x 3.  Insight and judgement nl & appropriate. No ideations.  Assessment and Plan:  1. Essential hypertension  - Continue medication, monitor blood pressure at home.  - Continue DASH diet.  Reminder to go to the ER if any CP,  SOB, nausea, dizziness, severe HA, changes vision/speech.  - CBC with Differential/Platelet - COMPLETE METABOLIC PANEL WITH GFR - Magnesium  2.  - Continue diet/meds, exercise,& lifestyle modifications.  - Continue monitor periodic cholesterol/liver & renal functions  Hyperlipidemia, mixed  - Lipid panel - TSH  3. Abnormal glucose  - Continue diet, exercise  - Lifestyle modifications.  - Monitor appropriate labs  - Hemoglobin A1c - Insulin, random  4. Vitamin D deficiency  - Continue supplementation.  - VITAMIN D 25 Hydroxy  5. Testosterone deficiency  - Testosterone  6. Aortic atherosclerosis ([place]) by Abd CT scan in 05/2015  - Lipid panel  7. Medication management  - CBC with  Differential/Platelet - COMPLETE METABOLIC PANEL WITH GFR - Magnesium - Lipid panel -  TSH - Hemoglobin A1c - Insulin, random - VITAMIN D 25 Hydroxy - Testosterone         Discussed  regular exercise, BP monitoring, weight control to achieve/maintain BMI less than 25 and discussed med and SE's. Recommended labs to assess and monitor clinical status with further disposition pending results of labs.  I discussed the assessment and treatment plan with the patient. The patient was provided an opportunity to ask questions and all were answered. The patient agreed with the plan and demonstrated an understanding of the instructions.  I provided over 30 minutes of exam, counseling, chart review and  complex critical decision making.        The patient was advised to call back or seek an in-person evaluation if the symptoms worsen or if the condition fails to improve as anticipated.   Kirtland Bouchard, MD

## 2021-02-16 ENCOUNTER — Other Ambulatory Visit: Payer: Self-pay

## 2021-02-16 ENCOUNTER — Ambulatory Visit: Payer: Medicare Other | Admitting: Internal Medicine

## 2021-02-16 VITALS — BP 110/70 | HR 68 | Temp 97.9°F | Resp 16 | Ht 72.0 in | Wt 203.0 lb

## 2021-02-16 DIAGNOSIS — I10 Essential (primary) hypertension: Secondary | ICD-10-CM | POA: Diagnosis not present

## 2021-02-16 DIAGNOSIS — I700 Atherosclerosis of aorta: Secondary | ICD-10-CM

## 2021-02-16 DIAGNOSIS — E559 Vitamin D deficiency, unspecified: Secondary | ICD-10-CM

## 2021-02-16 DIAGNOSIS — E782 Mixed hyperlipidemia: Secondary | ICD-10-CM | POA: Diagnosis not present

## 2021-02-16 DIAGNOSIS — Z79899 Other long term (current) drug therapy: Secondary | ICD-10-CM

## 2021-02-16 DIAGNOSIS — R7309 Other abnormal glucose: Secondary | ICD-10-CM | POA: Diagnosis not present

## 2021-02-16 DIAGNOSIS — E349 Endocrine disorder, unspecified: Secondary | ICD-10-CM

## 2021-02-17 LAB — COMPLETE METABOLIC PANEL WITH GFR
AG Ratio: 1.3 (calc) (ref 1.0–2.5)
ALT: 23 U/L (ref 9–46)
AST: 16 U/L (ref 10–35)
Albumin: 4.3 g/dL (ref 3.6–5.1)
Alkaline phosphatase (APISO): 82 U/L (ref 35–144)
BUN/Creatinine Ratio: 16 (calc) (ref 6–22)
BUN: 20 mg/dL (ref 7–25)
CO2: 26 mmol/L (ref 20–32)
Calcium: 9.5 mg/dL (ref 8.6–10.3)
Chloride: 106 mmol/L (ref 98–110)
Creat: 1.27 mg/dL — ABNORMAL HIGH (ref 0.70–1.18)
GFR, Est African American: 66 mL/min/{1.73_m2} (ref >=60)
GFR, Est Non African American: 57 mL/min/{1.73_m2} — ABNORMAL LOW (ref >=60)
Globulin: 3.3 g/dL (calc) (ref 1.9–3.7)
Glucose, Bld: 93 mg/dL (ref 65–99)
Potassium: 5 mmol/L (ref 3.5–5.3)
Sodium: 137 mmol/L (ref 135–146)
Total Bilirubin: 0.3 mg/dL (ref 0.2–1.2)
Total Protein: 7.6 g/dL (ref 6.1–8.1)

## 2021-02-17 LAB — LIPID PANEL
Cholesterol: 163 mg/dL (ref <200)
HDL: 41 mg/dL (ref >=40)
LDL Cholesterol (Calc): 97 mg/dL (calc)
Non-HDL Cholesterol (Calc): 122 mg/dL (calc) (ref <130)
Total CHOL/HDL Ratio: 4 (calc) (ref <5.0)
Triglycerides: 152 mg/dL — ABNORMAL HIGH (ref <150)

## 2021-02-17 LAB — CBC WITH DIFFERENTIAL/PLATELET
Absolute Monocytes: 446 cells/uL (ref 200–950)
Basophils Absolute: 29 cells/uL (ref 0–200)
Basophils Relative: 0.6 %
Eosinophils Absolute: 172 cells/uL (ref 15–500)
Eosinophils Relative: 3.5 %
HCT: 42.9 % (ref 38.5–50.0)
Hemoglobin: 14.8 g/dL (ref 13.2–17.1)
Lymphs Abs: 1147 cells/uL (ref 850–3900)
MCH: 33.3 pg — ABNORMAL HIGH (ref 27.0–33.0)
MCHC: 34.5 g/dL (ref 32.0–36.0)
MCV: 96.4 fL (ref 80.0–100.0)
MPV: 10.7 fL (ref 7.5–12.5)
Monocytes Relative: 9.1 %
Neutro Abs: 3107 cells/uL (ref 1500–7800)
Neutrophils Relative %: 63.4 %
Platelets: 243 10*3/uL (ref 140–400)
RBC: 4.45 10*6/uL (ref 4.20–5.80)
RDW: 12.5 % (ref 11.0–15.0)
Total Lymphocyte: 23.4 %
WBC: 4.9 10*3/uL (ref 3.8–10.8)

## 2021-02-17 LAB — HEMOGLOBIN A1C
Hgb A1c MFr Bld: 5.5 % of total Hgb (ref <5.7)
Mean Plasma Glucose: 111 mg/dL
eAG (mmol/L): 6.2 mmol/L

## 2021-02-17 LAB — MAGNESIUM: Magnesium: 2.2 mg/dL (ref 1.5–2.5)

## 2021-02-17 LAB — TESTOSTERONE: Testosterone: 399 ng/dL (ref 250–827)

## 2021-02-17 LAB — VITAMIN D 25 HYDROXY (VIT D DEFICIENCY, FRACTURES): Vit D, 25-Hydroxy: 64 ng/mL (ref 30–100)

## 2021-02-17 LAB — TSH: TSH: 4.43 mIU/L (ref 0.40–4.50)

## 2021-02-17 LAB — INSULIN, RANDOM: Insulin: 12.7 u[IU]/mL

## 2021-02-17 NOTE — Progress Notes (Signed)
============================================================ -   Test results slightly outside the reference range are not unusual. If there is anything important, I will review this with you,  otherwise it is considered normal test values.  If you have further questions,  please do not hesitate to contact me at the office or via My Chart.  ============================================================ ============================================================  -  Total Chol = 163   and LDL Chol = 97    -   Both  Excellent   - Very low risk for Heart Attack  / Stroke ========================================================  - A1c -Normal -great Diabetes - Great  ========================================================  - Vitamin D = 64 - Excellent  ! ========================================================  - Testosterone is Normal  ========================================================  - All Else - CBC - Kidneys - Electrolytes - Liver - Magnesium & Thyroid    - all  Normal / OK ===========================================================

## 2021-02-22 ENCOUNTER — Ambulatory Visit: Payer: Medicare Other | Admitting: Podiatry

## 2021-02-25 ENCOUNTER — Other Ambulatory Visit: Payer: Self-pay | Admitting: Internal Medicine

## 2021-02-25 DIAGNOSIS — I10 Essential (primary) hypertension: Secondary | ICD-10-CM

## 2021-03-08 ENCOUNTER — Other Ambulatory Visit: Payer: Self-pay | Admitting: Internal Medicine

## 2021-03-08 MED ORDER — FUROSEMIDE 40 MG PO TABS: ORAL_TABLET | ORAL | 1 refills | Status: DC

## 2021-03-30 ENCOUNTER — Other Ambulatory Visit: Payer: Self-pay

## 2021-03-30 DIAGNOSIS — I10 Essential (primary) hypertension: Secondary | ICD-10-CM

## 2021-03-30 MED ORDER — OLMESARTAN MEDOXOMIL 40 MG PO TABS: ORAL_TABLET | ORAL | 3 refills | Status: DC

## 2021-04-08 ENCOUNTER — Other Ambulatory Visit: Payer: Self-pay | Admitting: Internal Medicine

## 2021-04-12 ENCOUNTER — Other Ambulatory Visit: Payer: Self-pay | Admitting: Internal Medicine

## 2021-04-12 MED ORDER — PSEUDOEPHEDRINE HCL ER 120 MG PO TB12: ORAL_TABLET | ORAL | 1 refills | Status: DC

## 2021-04-12 MED ORDER — DEXAMETHASONE 4 MG PO TABS: ORAL_TABLET | ORAL | 0 refills | Status: DC

## 2021-04-14 NOTE — Progress Notes (Deleted)
Assessment and Plan:      Further disposition pending results of labs. Discussed med's effects and SE's.   Over 30 minutes of exam, counseling, chart review, and critical decision making was performed.   Future Appointments  Date Time Provider [place]  04/15/2021  4:00 PM Magda Bernheim, NP [facility] None  08/23/2021  2:00 PM Unk Pinto, MD [facility] None  11/15/2021 11:30 AM Liane Comber, NP [facility] None    ------------------------------------------------------------------------------------------------------------------   HPI There were no vitals taken for this visit. 70 y.o.male presents for  Past Medical History:  Diagnosis Date   Allergy    Anxiety    takes Valium daily as needed   Arthritis    in neck   BPH (benign prostatic hyperplasia)    takes Flomax daily   Brachial radiculitis 08/14/2013   Chronic back pain    DDD   COPD (chronic obstructive pulmonary disease) ([facility])    Diverticulosis    Foot drop    both feet   GERD (gastroesophageal reflux disease)    takes Omeprazole daily as well as Zantac   Hyperlipidemia    takes Atorvastatin daily   Hypertension    Hypogonadism male    Nocturia    Numbness    only in 4th and 5th finger on left hand and left leg   Prediabetes 12/26/2018   Reflux esophagitis 08/13/2013   Seasonal allergies    takes Claritin daily as needed and uses Flonase daily as needed   Urinary frequency    takes Flomax daily   Wears hearing aid 10/10/2019     Allergies  Allergen Reactions   Other Hives    Beef-hives  RED MEAT   Aspirin Hives and Rash   Salicylates Rash    Current Outpatient Medications on File Prior to Visit  Medication Sig   atorvastatin (LIPITOR) 40 MG tablet TAKE 1 TABLET 3 DAYS A WEEK FOR CHOLESTEROL.   dexamethasone (DECADRON) 4 MG tablet Take 1 tab 3 x /day for 2 days,      then 2 x /day for 2  Days,     then 1 tab daily   diclofenac (VOLTAREN) 75 MG EC tablet TAKE 1 TABLET 2 X DAY WITH FOOD  FOR PAIN & INFLAMMATION.   Flaxseed, Linseed, (FLAX SEED OIL) 1000 MG CAPS Take 1 capsule (1,000 mg total) by mouth 2 (two) times daily.   fluticasone (FLONASE) 50 MCG/ACT nasal spray SPRAY 2 SPRAYS INTO EACH NOSTRIL EVERY DAY   furosemide (LASIX) 40 MG tablet Take  1 tablet  2 x /day  for Fluid Retention & leg Swelling   gabapentin (NEURONTIN) 600 MG tablet Take 1 tablet (600 mg total) by mouth 3 (three) times daily.   loratadine (CLARITIN) 10 MG tablet Take 1 tablet (10 mg total) by mouth as needed for allergies.   Magnesium 250 MG TABS Take 250 mg by mouth daily.   Needles & Syringes MISC Use for Testosterone inject every 2 weeks.   olmesartan (BENICAR) 40 MG tablet Take one tablet daily for blood pressure   omeprazole (PRILOSEC) 20 MG capsule TAKE 1 CAPSULE BY MOUTH  TWICE DAILY AS NEEDED FOR  INDIGESTION AND ACID REFLUX   oxyCODONE (OXY IR/ROXICODONE) 5 MG immediate release tablet Take 5 mg by mouth every 4 (four) hours as needed for severe pain.   pseudoephedrine (SUDAFED) 120 MG 12 hr tablet Take      1 tablet      2 x /day (every 12 hours)  for Head and Chest Congestion (Patient not taking: Reported on 02/16/2021)   pseudoephedrine (SUDAFED) 120 MG 12 hr tablet Take 1 tablet  2 x /day (every 12 hours)  for Sinus & Chest Congestion   sildenafil (VIAGRA) 100 MG tablet Take 1 tablet (100 mg total) by mouth as needed for erectile dysfunction.   testosterone cypionate (DEPOTESTOSTERONE CYPIONATE) 200 MG/ML injection Inject      2 ml       into muscle        every 2 weeks   vitamin B-12 (CYANOCOBALAMIN) 1000 MCG tablet Take 1,000 mcg by mouth daily.   VITAMIN D PO Take 5,000 Units by mouth daily.   No current facility-administered medications on file prior to visit.    ROS: all negative except above.   Physical Exam:  There were no vitals taken for this visit.  General Appearance: Well nourished, in no apparent distress. Eyes: PERRLA, EOMs, conjunctiva no swelling or  erythema Sinuses: No Frontal/maxillary tenderness ENT/Mouth: Ext aud canals clear, TMs without erythema, bulging. No erythema, swelling, or exudate on post pharynx.  Tonsils not swollen or erythematous. Hearing normal.  Neck: Supple, thyroid normal.  Respiratory: Respiratory effort normal, BS equal bilaterally without rales, rhonchi, wheezing or stridor.  Cardio: RRR with no MRGs. Brisk peripheral pulses without edema.  Abdomen: Soft, + BS.  Non tender, no guarding, rebound, hernias, masses. Lymphatics: Non tender without lymphadenopathy.  Musculoskeletal: Full ROM, 5/5 strength, normal gait.  Skin: Warm, dry without rashes, lesions, ecchymosis.  Neuro: Cranial nerves intact. Normal muscle tone, no cerebellar symptoms. Sensation intact.  Psych: Awake and oriented X 3, normal affect, Insight and Judgment appropriate.     Magda Bernheim, NP 8:49 PM [facility]

## 2021-04-15 ENCOUNTER — Ambulatory Visit: Payer: Medicare Other | Admitting: Nurse Practitioner

## 2021-04-15 DIAGNOSIS — I700 Atherosclerosis of aorta: Secondary | ICD-10-CM

## 2021-04-15 DIAGNOSIS — J449 Chronic obstructive pulmonary disease, unspecified: Secondary | ICD-10-CM

## 2021-04-15 DIAGNOSIS — I10 Essential (primary) hypertension: Secondary | ICD-10-CM

## 2021-04-21 NOTE — Progress Notes (Signed)
Assessment and Plan:  Jacob Larsen was seen today for acute visit.  Diagnoses and all orders for this visit:  Seasonal allergic rhinitis, unspecified trigger Discussed the importance of avoiding unnecessary antibiotic therapy. Suggested symptomatic OTC remedies. Nasal saline spray for congestion. Nasal steroids, allergy pill Limit exposure to trigger Follow up as needed. -     fexofenadine (ALLEGRA) 180 MG tablet; Take 1 tablet (180 mg total) by mouth daily. -     fluticasone (FLONASE) 50 MCG/ACT nasal spray; Place 1-2 sprays into both nostrils daily.  Essential hypertension Reduce lasix to PRN only, monitor BP, push fluids If continues to have low BP/dizziness despite holding lasix try olmesartan 1/2 tab only Contact office for further guidance as needed -     furosemide (LASIX) 40 MG tablet; Take  1 tablet  2 x /day as needed for Fluid Retention & leg Swelling  Further disposition pending results of labs. Discussed med's effects and SE's.   Over 15 minutes of exam, counseling, chart review, and critical decision making was performed.   Future Appointments  Date Time Provider [place]  08/23/2021  2:00 PM Unk Pinto, MD [facility] None  11/15/2021 11:30 AM Magda Bernheim, NP [facility] None    ------------------------------------------------------------------------------------------------------------------   HPI BP 100/64   Pulse 75   Temp 97.7 F (36.5 C)   Wt 190 lb (86.2 kg)   SpO2 99%   BMI 25.77 kg/m  70 y.o.male remote smoker presents for evaluation of persistent URI sx.   He reports sx began 2 weeks ago, gradual onset, with nasal congestion and mild headache, "swimmy headed." Intermittently mild throat clearing but resolves. Notes pressure behind his eyes. Denies fever but notes intermittent chills. He has noted watery itchy eyes during this time.   He doesn't typically have allergies this time of the year, has taken claritin remotely. He reports several  negative covid 19 tests in the last 2 weeks.   He was prescribed dexamethasone taper and sudafed by Dr. Melford Aase 04/12/2021, did improve sx but returned once completed steroid taper.   His blood pressure has been controlled at home, today their BP is BP: 100/64  He does workout. He denies chest pain, shortness of breath. Does endorse some light headedness and dizziness over the last 2-3 weeks, particularly working outdoors. Currently takes olmesartan 40 mg and lasix 40 mg BID PRN (edema), hasn't noted lasix helps much with edema.     Past Medical History:  Diagnosis Date   Allergy    Anxiety    takes Valium daily as needed   Arthritis    in neck   BPH (benign prostatic hyperplasia)    takes Flomax daily   Brachial radiculitis 08/14/2013   Chronic back pain    DDD   COPD (chronic obstructive pulmonary disease) ([facility])    Diverticulosis    Foot drop    both feet   GERD (gastroesophageal reflux disease)    takes Omeprazole daily as well as Zantac   Hyperlipidemia    takes Atorvastatin daily   Hypertension    Hypogonadism male    Nocturia    Numbness    only in 4th and 5th finger on left hand and left leg   Prediabetes 12/26/2018   Reflux esophagitis 08/13/2013   Seasonal allergies    takes Claritin daily as needed and uses Flonase daily as needed   Urinary frequency    takes Flomax daily   Wears hearing aid 10/10/2019     Allergies  Allergen  Reactions   Other Hives    Beef-hives  RED MEAT   Aspirin Hives and Rash   Salicylates Rash    Current Outpatient Medications on File Prior to Visit  Medication Sig   atorvastatin (LIPITOR) 40 MG tablet TAKE 1 TABLET 3 DAYS A WEEK FOR CHOLESTEROL.   Flaxseed, Linseed, (FLAX SEED OIL) 1000 MG CAPS Take 1 capsule (1,000 mg total) by mouth 2 (two) times daily.   furosemide (LASIX) 40 MG tablet Take  1 tablet  2 x /day  for Fluid Retention & leg Swelling   gabapentin (NEURONTIN) 600 MG tablet Take 1 tablet (600 mg total) by mouth 3  (three) times daily.   Magnesium 250 MG TABS Take 250 mg by mouth daily.   Needles & Syringes MISC Use for Testosterone inject every 2 weeks.   olmesartan (BENICAR) 40 MG tablet Take one tablet daily for blood pressure   omeprazole (PRILOSEC) 20 MG capsule TAKE 1 CAPSULE BY MOUTH  TWICE DAILY AS NEEDED FOR  INDIGESTION AND ACID REFLUX   oxyCODONE (OXY IR/ROXICODONE) 5 MG immediate release tablet Take 5 mg by mouth every 4 (four) hours as needed for severe pain.   pseudoephedrine (SUDAFED) 120 MG 12 hr tablet Take      1 tablet      2 x /day (every 12 hours)          for Head and Chest Congestion (Patient taking differently: Take      1 tablet      2 x /day (every 12 hours)          for Head and Chest Congestion)   sildenafil (VIAGRA) 100 MG tablet Take 1 tablet (100 mg total) by mouth as needed for erectile dysfunction.   testosterone cypionate (DEPOTESTOSTERONE CYPIONATE) 200 MG/ML injection Inject      2 ml       into muscle        every 2 weeks   vitamin B-12 (CYANOCOBALAMIN) 1000 MCG tablet Take 1,000 mcg by mouth daily.   VITAMIN D PO Take 5,000 Units by mouth daily.   dexamethasone (DECADRON) 4 MG tablet Take 1 tab 3 x /day for 2 days,      then 2 x /day for 2  Days,     then 1 tab daily   diclofenac (VOLTAREN) 75 MG EC tablet TAKE 1 TABLET 2 X DAY WITH FOOD FOR PAIN & INFLAMMATION.   fluticasone (FLONASE) 50 MCG/ACT nasal spray SPRAY 2 SPRAYS INTO EACH NOSTRIL EVERY DAY (Patient not taking: Reported on 04/22/2021)   loratadine (CLARITIN) 10 MG tablet Take 1 tablet (10 mg total) by mouth as needed for allergies. (Patient not taking: Reported on 04/22/2021)   pseudoephedrine (SUDAFED) 120 MG 12 hr tablet Take 1 tablet  2 x /day (every 12 hours)  for Sinus & Chest Congestion   No current facility-administered medications on file prior to visit.    ROS: all negative except above.   Physical Exam:  BP 100/64   Pulse 75   Temp 97.7 F (36.5 C)   Wt 190 lb (86.2 kg)   SpO2 99%   BMI 25.77  kg/m   General Appearance: Well nourished, in no apparent distress. Eyes: PERRLA, conjunctiva no swelling or erythema Sinuses: No Frontal/maxillary tenderness ENT/Mouth: Ext aud canals clear, TMs without erythema, bulging. No erythema, swelling, or exudate on post pharynx.  Pale boggy turbinates with clear discharge. Tonsils not swollen or erythematous. Hearing normal.  Neck: Supple Respiratory: Respiratory  effort normal, BS equal bilaterally without rales, rhonchi, wheezing or stridor.  Cardio: RRR with no MRGs. Brisk peripheral pulses without edema.  Lymphatics: Non tender without lymphadenopathy.  Musculoskeletal: normal gait.  Skin: Warm, dry without rashes, lesions, ecchymosis.  Neuro: Normal muscle tone Psych: Awake and oriented X 3, normal affect, Insight and Judgment appropriate.     Izora Ribas, NP 9:20 AM [facility]

## 2021-04-22 ENCOUNTER — Ambulatory Visit: Payer: Medicare Other | Admitting: Adult Health

## 2021-04-22 ENCOUNTER — Encounter: Payer: Self-pay | Admitting: Adult Health

## 2021-04-22 ENCOUNTER — Other Ambulatory Visit: Payer: Self-pay

## 2021-04-22 VITALS — BP 100/64 | HR 75 | Temp 97.7°F | Wt 190.0 lb

## 2021-04-22 DIAGNOSIS — J302 Other seasonal allergic rhinitis: Secondary | ICD-10-CM | POA: Diagnosis not present

## 2021-04-22 DIAGNOSIS — I10 Essential (primary) hypertension: Secondary | ICD-10-CM

## 2021-04-22 MED ORDER — FUROSEMIDE 40 MG PO TABS: ORAL_TABLET | ORAL | 1 refills | Status: DC

## 2021-04-22 MED ORDER — FEXOFENADINE HCL 180 MG PO TABS: 180.0000 mg | ORAL_TABLET | Freq: Every day | ORAL | 2 refills | Status: DC

## 2021-04-22 MED ORDER — FLUTICASONE PROPIONATE 50 MCG/ACT NA SUSP: 1.0000 | Freq: Every day | NASAL | 2 refills | Status: DC

## 2021-04-22 NOTE — Patient Instructions (Signed)
Allergic Rhinitis, Adult  Allergic rhinitis is an allergic reaction that affects the mucous membraneinside the nose. The mucous membrane is the tissue that produces mucus. There are two types of allergic rhinitis: Seasonal. This type is also called hay fever and happens only during certain seasons. Perennial. This type can happen at any time of the year. Allergic rhinitis cannot be spread from person to person. This condition can bemild, moderate, or severe. It can develop at any age and may be outgrown. What are the causes? This condition is caused by allergens. These are things that can cause an allergic reaction. Allergens may differ for seasonal allergic rhinitis and perennial allergic rhinitis. Seasonal allergic rhinitis is triggered by pollen. Pollen can come from grasses, trees, and weeds. Perennial allergic rhinitis may be triggered by: Dust mites. Proteins in a pet's urine, saliva, or dander. Dander is dead skin cells from a pet. Smoke, mold, or car fumes. What increases the risk? You are more likely to develop this condition if you have a family history of allergies or other conditions related to allergies, including: Allergic conjunctivitis. This is inflammation of parts of the eyes and eyelids. Asthma. This condition affects the lungs and makes it hard to breathe. Atopic dermatitis or eczema. This is long term (chronic) inflammation of the skin. Food allergies. What are the signs or symptoms? Symptoms of this condition include: Sneezing or coughing. A stuffy nose (nasal congestion), itchy nose, or nasal discharge. Itchy eyes and tearing of the eyes. A feeling of mucus dripping down the back of your throat (postnasal drip). Trouble sleeping. Tiredness or fatigue. Headache. Sore throat. How is this diagnosed? This condition may be diagnosed with your symptoms, medical history, and physical exam. Your health care provider may check for related conditions, such  as: Asthma. Pink eye. This is eye inflammation caused by infection (conjunctivitis). Ear infection. Upper respiratory infection. This is an infection in the nose, throat, or upper airways. You may also have tests to find out which allergens trigger your symptoms.These may include skin tests or blood tests. How is this treated? There is no cure for this condition, but treatment can help control symptoms. Treatment may include: Taking medicines that block allergy symptoms, such as corticosteroids and antihistamines. Medicine may be given as a shot, nasal spray, or pill. Avoiding any allergens. Being exposed again and again to tiny amounts of allergens to help you build a defense against allergens (immunotherapy). This is done if other treatments have not helped. It may include: Allergy shots. These are injected medicines that have small amounts of allergen in them. Sublingual immunotherapy. This involves taking small doses of a medicine with allergen in it under your tongue. If these treatments do not work, your health care provider may prescribe newer,stronger medicines. Follow these instructions at home: Avoiding allergens Find out what you are allergic to and avoid those allergens. These are some things you can do to help avoid allergens: If you have perennial allergies: Replace carpet with wood, tile, or vinyl flooring. Carpet can trap dander and dust. Do not smoke. Do not allow smoking in your home. Change your heating and air conditioning filters at least once a month. If you have seasonal allergies, take these steps during allergy season: Keep windows closed as much as possible. Plan outdoor activities when pollen counts are lowest. Check pollen counts before you plan outdoor activities. When coming indoors, change clothing and shower before sitting on furniture or bedding. If you have a pet in the house that  produces allergens: Keep the pet out of the bedroom. Vacuum, sweep, and dust  regularly. General instructions Take over-the-counter and prescription medicines only as told by your health care provider. Drink enough fluid to keep your urine pale yellow. Keep all follow-up visits as told by your health care provider. This is important. Where to find more information American Academy of Allergy, Asthma & Immunology: www.aaaai.org Contact a health care provider if: You have a fever. You develop a cough that does not go away. You make whistling sounds when you breathe (wheeze). Your symptoms slow you down or stop you from doing your normal activities each day. Get help right away if: You have shortness of breath. This symptom may represent a serious problem that is an emergency. Do not wait to see if the symptom will go away. Get medical help right away. Call your local emergency services (911 in the [place] Do not drive yourself to the hospital. Summary Allergic rhinitis may be managed by taking medicines as directed and avoiding allergens. If you have seasonal allergies, keep windows closed as much as possible during allergy season. Contact your health care provider if you develop a fever or a cough that does not go away. This information is not intended to replace advice given to you by your health care provider. Make sure you discuss any questions you have with your healthcare provider. Document Revised: 10/18/2019 Document Reviewed: 08/27/2019 Elsevier Patient Education  2022 [employer].

## 2021-05-04 ENCOUNTER — Other Ambulatory Visit: Payer: Self-pay | Admitting: Internal Medicine

## 2021-05-24 ENCOUNTER — Other Ambulatory Visit: Payer: Self-pay | Admitting: Internal Medicine

## 2021-05-24 MED ORDER — ATORVASTATIN CALCIUM 40 MG PO TABS: ORAL_TABLET | ORAL | 3 refills | Status: DC

## 2021-05-26 ENCOUNTER — Other Ambulatory Visit: Payer: Self-pay | Admitting: Internal Medicine

## 2021-05-26 MED ORDER — ATORVASTATIN CALCIUM 40 MG PO TABS: ORAL_TABLET | ORAL | 3 refills | Status: DC

## 2021-06-01 ENCOUNTER — Ambulatory Visit (INDEPENDENT_AMBULATORY_CARE_PROVIDER_SITE_OTHER): Payer: Medicare Other

## 2021-06-01 ENCOUNTER — Ambulatory Visit: Payer: Medicare Other | Admitting: Podiatry

## 2021-06-01 ENCOUNTER — Other Ambulatory Visit: Payer: Self-pay

## 2021-06-01 DIAGNOSIS — R0989 Other specified symptoms and signs involving the circulatory and respiratory systems: Secondary | ICD-10-CM

## 2021-06-01 DIAGNOSIS — I872 Venous insufficiency (chronic) (peripheral): Secondary | ICD-10-CM | POA: Diagnosis not present

## 2021-06-01 DIAGNOSIS — R600 Localized edema: Secondary | ICD-10-CM | POA: Diagnosis not present

## 2021-06-02 NOTE — Progress Notes (Signed)
He presents for follow-up today states that the numbness in his feet are getting worse and he has been taking his gabapentin but having to take it at a lower dose.  States that his nerve stimulator in his back/spinal stimulator may be causing some of the numbness relating that as his hip pain increases on the right side side is the numbness.  He states that he is getting swelling and he can hardly wear shoes.  Objective: Vital signs are stable he is alert and oriented x3.  Pulses are palpable but seem diminished..  He does have some venous insufficiency with some pitting edema.  He has no reproducible pain bilaterally.  Assessment: Decreased pulses and venous insufficiency or edema.  Plan: At this point we are going to send him for studies consisting of ABIs and toe pressures as well as venous studies for varicosities.

## 2021-06-10 ENCOUNTER — Other Ambulatory Visit: Payer: Self-pay

## 2021-06-10 ENCOUNTER — Ambulatory Visit (HOSPITAL_COMMUNITY)
Admission: RE | Admit: 2021-06-10 | Discharge: 2021-06-10 | Disposition: A | Discharge status: Home / Self Care | Payer: 59 | Source: Ambulatory Visit | Attending: Podiatry | Admitting: Podiatry

## 2021-06-10 ENCOUNTER — Ambulatory Visit (INDEPENDENT_AMBULATORY_CARE_PROVIDER_SITE_OTHER)
Admission: RE | Admit: 2021-06-10 | Discharge: 2021-06-10 | Disposition: A | Discharge status: Home / Self Care | Payer: 59 | Source: Ambulatory Visit | Attending: Podiatry | Admitting: Podiatry

## 2021-06-10 DIAGNOSIS — R600 Localized edema: Secondary | ICD-10-CM

## 2021-06-10 DIAGNOSIS — R0989 Other specified symptoms and signs involving the circulatory and respiratory systems: Secondary | ICD-10-CM | POA: Diagnosis not present

## 2021-06-11 ENCOUNTER — Telehealth: Payer: Self-pay | Admitting: *Deleted

## 2021-06-11 DIAGNOSIS — R0989 Other specified symptoms and signs involving the circulatory and respiratory systems: Secondary | ICD-10-CM

## 2021-06-11 NOTE — Telephone Encounter (Signed)
-----   Message from Garrel Ridgel, [place] sent at 06/10/2021  5:02 PM EDT ----- Vascular recommends a vascular consult.  Could you please make sure this consult is in place.

## 2021-06-28 ENCOUNTER — Ambulatory Visit: Payer: Medicare Other

## 2021-07-04 NOTE — Progress Notes (Signed)
[facility] [place]  ASSESSMENT / PLAN: Jacob Larsen is a 70 y.o. male with right lower extremity pain. He has no wounds of hemodynamically significant peripheral arterial disease on clinical exam or noninvasive testing.  Venous reflux testing shows some venous insufficiency, but this is not symptomatic to him.  I suspect his symptoms are from spinal stenosis.  Follow-up with me as needed.  CHIEF COMPLAINT: Right leg pain  HISTORY OF PRESENT ILLNESS: Jacob Larsen is a 71 y.o. male referred to clinic for evaluation of right lower extremity pain.  The patient reports radiating pain from his hip down to his foot.  He has paresthesias about his foot.  The pain is exacerbated by positional changes and by extended time on his feet - either walking or standing.  Does not give a classic symptom history of intermittent claudication.  He has no ischemic pain in his foot.  He has no ulcers.  He has a lengthy history of back surgeries, most recently having undergone spinal cord stimulator placement which is afforded him some relief.  He is mostly concerned that he may be missing something and is worried about his risk for amputation.  Past Medical History:  Diagnosis Date   Allergy    Anxiety    takes Valium daily as needed   Arthritis    in neck   BPH (benign prostatic hyperplasia)    takes Flomax daily   Brachial radiculitis 08/14/2013   Chronic back pain    DDD   COPD (chronic obstructive pulmonary disease) (HCC)    Diverticulosis    Foot drop    both feet   GERD (gastroesophageal reflux disease)    takes Omeprazole daily as well as Zantac   Hyperlipidemia    takes Atorvastatin daily   Hypertension    Hypogonadism male    Nocturia    Numbness    only in 4th and 5th finger on left hand and left leg   Prediabetes 12/26/2018   Reflux esophagitis 08/13/2013   Seasonal allergies    takes Claritin daily as needed and uses Flonase daily as needed   Urinary frequency     takes Flomax daily   Wears hearing aid 10/10/2019    Past Surgical History:  Procedure Laterality Date   BACK SURGERY  2016 x 2 L3-L5, 12/2016 S1   lumbar fusion x 3   CERVICAL FUSION  2011/2012   COLONOSCOPY     KNEE ARTHROSCOPY     right 1985; left 1992   PROSTATE SURGERY  10/07/2019   Rezium water vapor procedure by Dr. Gloriann Loan   SEPTOPLASTY     SHOULDER ARTHROSCOPY Left 2015   SHOULDER ARTHROSCOPY WITH ROTATOR CUFF REPAIR Right 08/18/2017   Dr. Onnie Graham   SHOULDER SURGERY Right 08   SPINAL CORD STIMULATOR IMPLANT  08/21/2020   Dr. Blair Hailey    Family History  Problem Relation Age of Onset   Leukemia Mother    Diabetes Mother    Hypertension Father    Diabetes Father    Colon cancer Neg Hx    Stomach cancer Neg Hx    Esophageal cancer Neg Hx    Rectal cancer Neg Hx     Social History   Socioeconomic History   Marital status: Married    Spouse name: Not on file   Number of children: Not on file   Years of education: Not on file   Highest education level: Not on file  Occupational History  Not on file  Tobacco Use   Smoking status: Former    Types: Cigarettes    Quit date: 05/28/1995    Years since quitting: 26.1   Smokeless tobacco: Never   Tobacco comments:    quit smoking >20+yrs ago  Vaping Use   Vaping Use: Never used  Substance and Sexual Activity   Alcohol use: Not Currently    Alcohol/week: 0.0 standard drinks    Comment: rarely   Drug use: No   Sexual activity: Yes  Other Topics Concern   Not on file  Social History Narrative   Not on file   Social Determinants of Health   Financial Resource Strain: Not on file  Food Insecurity: Not on file  Transportation Needs: Not on file  Physical Activity: Not on file  Stress: Not on file  Social Connections: Not on file  Intimate Partner Violence: Not on file    Allergies  Allergen Reactions   Other Hives    Beef-hives  RED MEAT   Aspirin Hives and Rash   Salicylates Rash    Current  Outpatient Medications  Medication Sig Dispense Refill   atorvastatin (LIPITOR) 40 MG tablet TAKE 1 TABLET 3 DAYS A WEEK FOR CHOLESTEROL. 39 tablet 3   diclofenac (VOLTAREN) 75 MG EC tablet TAKE 1 TABLET 2 X DAY WITH FOOD FOR PAIN & INFLAMMATION. 180 tablet 1   fexofenadine (ALLEGRA) 180 MG tablet Take 1 tablet (180 mg total) by mouth daily. 30 tablet 2   Flaxseed, Linseed, (FLAX SEED OIL) 1000 MG CAPS Take 1 capsule (1,000 mg total) by mouth 2 (two) times daily.  0   fluticasone (FLONASE) 50 MCG/ACT nasal spray Place 1-2 sprays into both nostrils daily. 16 mL 2   furosemide (LASIX) 40 MG tablet Take  1 tablet  2 x /day as needed for Fluid Retention & leg Swelling 180 tablet 1   gabapentin (NEURONTIN) 600 MG tablet Take 1 tablet (600 mg total) by mouth 3 (three) times daily. 90 tablet 3   Magnesium 250 MG TABS Take 250 mg by mouth daily.     Needles & Syringes MISC Use for Testosterone inject every 2 weeks. 50 each 3   olmesartan (BENICAR) 40 MG tablet Take one tablet daily for blood pressure 90 tablet 3   omeprazole (PRILOSEC) 20 MG capsule TAKE 1 CAPSULE BY MOUTH  TWICE DAILY AS NEEDED FOR  INDIGESTION AND ACID REFLUX 180 capsule 3   oxyCODONE (OXY IR/ROXICODONE) 5 MG immediate release tablet Take 5 mg by mouth every 4 (four) hours as needed for severe pain.     testosterone cypionate (DEPOTESTOSTERONE CYPIONATE) 200 MG/ML injection INJECT 2 ML INTO MUSCLE EVERY 2 WEEKS 12 mL 0   vitamin B-12 (CYANOCOBALAMIN) 1000 MCG tablet Take 1,000 mcg by mouth daily.     VITAMIN D PO Take 5,000 Units by mouth daily.     pseudoephedrine (SUDAFED) 120 MG 12 hr tablet Take      1 tablet      2 x /day (every 12 hours)          for Head and Chest Congestion (Patient not taking: Reported on 07/05/2021) 60 tablet 0   sildenafil (VIAGRA) 100 MG tablet Take 1 tablet (100 mg total) by mouth as needed for erectile dysfunction. 30 tablet 1   No current facility-administered medications for this visit.    REVIEW OF  SYSTEMS:  [X]  denotes positive finding, [ ]  denotes negative finding Cardiac  Comments:  Chest pain  or chest pressure:    Shortness of breath upon exertion:    Short of breath when lying flat:    Irregular heart rhythm:        Vascular    Pain in calf, thigh, or hip brought on by ambulation: x   Pain in feet at night that wakes you up from your sleep:     Blood clot in your veins:    Leg swelling:         Pulmonary    Oxygen at home:    Productive cough:     Wheezing:         Neurologic    Sudden weakness in arms or legs:     Sudden numbness in arms or legs:     Sudden onset of difficulty speaking or slurred speech:    Temporary loss of vision in one eye:     Problems with dizziness:         Gastrointestinal    Blood in stool:     Vomited blood:         Genitourinary    Burning when urinating:     Blood in urine:        Psychiatric    Major depression:         Hematologic    Bleeding problems:    Problems with blood clotting too easily:        Skin    Rashes or ulcers:        Constitutional    Fever or chills:      PHYSICAL EXAM Vitals:   07/05/21 1055  BP: 129/84  Pulse: 68  Resp: 18  Temp: 98.1 F (36.7 C)  TempSrc: Temporal  SpO2: 97%  Weight: 190 lb (86.2 kg)  Height: 6\' 2"  (1.88 m)    Constitutional: well appearing. no distress. Appears well nourished.  Neurologic: CN intact. no focal findings. no sensory loss. Psychiatric:  Mood and affect symmetric and appropriate. Eyes:  No icterus. No conjunctival pallor. Ears, nose, throat:  mucous membranes moist. Midline trachea.  Cardiac: regular rate and rhythm.  Respiratory:  unlabored. Abdominal:  soft, non-tender, non-distended.  Peripheral vascular: 2+ PT pulses. Extremity: no edema. no cyanosis. no pallor.  Skin: no gangrene. no ulceration.  Lymphatic: no Stemmer's sign. no palpable lymphadenopathy.  PERTINENT LABORATORY AND RADIOLOGIC DATA  Most recent CBC CBC Latest Ref Rng & Units  02/16/2021 11/12/2020 08/10/2020  WBC 3.8 - 10.8 Thousand/uL 4.9 4.6 3.3(L)  Hemoglobin 13.2 - 17.1 g/dL 14.8 14.5 14.2  Hematocrit 38.5 - 50.0 % 42.9 42.1 42.2  Platelets 140 - 400 Thousand/uL 243 243 238     Most recent CMP CMP Latest Ref Rng & Units 02/16/2021 11/12/2020 08/10/2020  Glucose 65 - 99 mg/dL 93 91 88  BUN 7 - 25 mg/dL 20 22 16   Creatinine 0.70 - 1.18 mg/dL 1.27(H) 1.09 1.19  Sodium 135 - 146 mmol/L 137 138 138  Potassium 3.5 - 5.3 mmol/L 5.0 4.8 4.4  Chloride 98 - 110 mmol/L 106 105 106  CO2 20 - 32 mmol/L 26 26 27   Calcium 8.6 - 10.3 mg/dL 9.5 9.5 9.4  Total Protein 6.1 - 8.1 g/dL 7.6 7.0 6.9  Total Bilirubin 0.2 - 1.2 mg/dL 0.3 0.6 0.4  Alkaline Phos 40 - 115 U/L - - -  AST 10 - 35 U/L 16 17 19   ALT 9 - 46 U/L 23 24 21     Renal function CrCl cannot be calculated (Patient's most  recent lab result is older than the maximum 21 days allowed.).  Hgb A1c MFr Bld (% of total Hgb)  Date Value  02/16/2021 5.5    LDL Cholesterol (Calc)  Date Value Ref Range Status  02/16/2021 97 mg/dL (calc) Final    Comment:    Reference range: <100 . Desirable range <100 mg/dL for primary prevention;   <70 mg/dL for patients with CHD or diabetic patients  with > or = 2 CHD risk factors. Marland Kitchen LDL-C is now calculated using the Martin-Hopkins  calculation, which is a validated novel method providing  better accuracy than the Friedewald equation in the  estimation of LDL-C.  Cresenciano Genre et al. Annamaria Helling. [id number];665(99): 2061-2068  (http://education.[facility].com/faq/FAQ164)      Vascular Imaging: ABI  +-------+-----------+-----------+------------+------------+  ABI/TBIToday's ABIToday's TBIPrevious ABIPrevious TBI  +-------+-----------+-----------+------------+------------+  Right  1.26       1.1                                  +-------+-----------+-----------+------------+------------+  Left   1.27       0.93                                  +-------+-----------+-----------+------------+------------+  Venous reflux study - Deep vein reflux in the CFV, FV, and popliteal vein.  - Superficial vein reflux in the SSV, and the GSV from mid thigh to  proximal calf.  - No reflux in SFJ or proximal GSV.  Yevonne Aline. Stanford Breed, MD [facility] Phone Number: [phone] 07/05/2021 11:18 AM  Total time spent on preparing this encounter including chart review, data review, collecting history, examining the patient, coordinating care for this new patient, 45 minutes.  Portions of this report may have been transcribed using voice recognition software.  Every effort has been made to ensure accuracy; however, inadvertent computerized transcription errors may still be present.

## 2021-07-05 ENCOUNTER — Encounter: Payer: Self-pay | Admitting: Vascular Surgery

## 2021-07-05 ENCOUNTER — Other Ambulatory Visit: Payer: Self-pay

## 2021-07-05 ENCOUNTER — Ambulatory Visit: Payer: Medicare Other | Admitting: Vascular Surgery

## 2021-07-05 VITALS — BP 129/84 | HR 68 | Temp 98.1°F | Resp 18 | Ht 74.0 in | Wt 190.0 lb

## 2021-07-05 DIAGNOSIS — M79604 Pain in right leg: Secondary | ICD-10-CM | POA: Diagnosis not present

## 2021-07-06 ENCOUNTER — Ambulatory Visit (INDEPENDENT_AMBULATORY_CARE_PROVIDER_SITE_OTHER): Payer: Medicare Other

## 2021-07-06 VITALS — Temp 97.3°F

## 2021-07-06 DIAGNOSIS — Z23 Encounter for immunization: Secondary | ICD-10-CM

## 2021-07-14 ENCOUNTER — Other Ambulatory Visit: Payer: Self-pay | Admitting: Adult Health

## 2021-07-14 ENCOUNTER — Other Ambulatory Visit: Payer: Self-pay

## 2021-07-14 DIAGNOSIS — E349 Endocrine disorder, unspecified: Secondary | ICD-10-CM

## 2021-07-14 DIAGNOSIS — J302 Other seasonal allergic rhinitis: Secondary | ICD-10-CM

## 2021-07-14 MED ORDER — NEEDLES & SYRINGES MISC: 3 refills | Status: DC

## 2021-07-18 ENCOUNTER — Other Ambulatory Visit: Payer: Self-pay | Admitting: Adult Health

## 2021-07-18 DIAGNOSIS — J302 Other seasonal allergic rhinitis: Secondary | ICD-10-CM

## 2021-08-22 ENCOUNTER — Encounter: Payer: Self-pay | Admitting: Internal Medicine

## 2021-08-22 NOTE — Progress Notes (Signed)
Annual  Screening/Preventative Visit  & Comprehensive Evaluation & Examination  Future Appointments  Date Time Provider Department  08/23/2021     CPE  2:00 PM Unk Pinto, MD [facility]  11/15/2021         WELLNESS 11:30 AM Magda Bernheim, NP [facility]            This very nice 70 y.o. MBM presents for a Screening /Preventative Visit & comprehensive evaluation and management of multiple medical co-morbidities.  Patient has been followed for HTN, HLD, Prediabetes and Vitamin D Deficiency.  Patient has GERD controlled by diet & Prilosec.  Dr Gloriann Loan (Urologist) follows patient for [place] s/p  Rezium vaporization procedure (Jan 2021) .  Abd CT  scan in Sept 2016 showed Aortic Atherosclerosis.                                          Patient has DDD /DJD with chronic neck & LBP and had 2 neck & 4 back surgeries with last L5-S1 Decompression /Fusion in 2018 by Dr Trenton Gammon. He follows in Pain Mgmt with Dr Luan Pulling on chronic Opioids & has a spinal cord stimulator ( Dec 20021 - Dr Davy Pique).         HTN predates circa 2013. Patient's BP has been controlled at home.  Today's BP is at goal - 114/78. Patient denies any cardiac symptoms as chest pain, palpitations, shortness of breath, dizziness or ankle swelling.       Patient's hyperlipidemia is controlled with diet and Atorvastatin Laverle Hobby. Patient denies myalgias or other medication SE's. Last lipids were at goal :  Lab Results  Component Value Date   CHOL 163 02/16/2021   HDL 41 02/16/2021   LDLCALC 97 02/16/2021   TRIG 152 (H) 02/16/2021   CHOLHDL 4.0 02/16/2021         Patient has Moderate Obesity (BMI 31.1+) with consequent  prediabetes (A1c 6.0% /2014) and patient denies reactive hypoglycemic symptoms, visual blurring, diabetic polys or paresthesias. Last A1c was normal & at goal :   Lab Results  Component Value Date   HGBA1C 5.5 02/16/2021                                 Patient is on parenteral Testosterone Replacement  for Testosterone Deficiency with improved sense of well-being & stamina.       Finally, patient has history of Vitamin D Deficiency & last vitamin D was at goal :   Lab Results  Component Value Date   VD25OH 64 02/16/2021     Current Outpatient Medications on File Prior to Visit  Medication Sig   atorvastatin 40 MG tablet TAKE 1 TABLET 3 DAYS A WEEK    diclofenac 75 MG EC tablet TAKE 1 TABLET 2 X DAY    fexofenadine  180 MG tablet TAKE 1 TABLET EVERY DAY   FLAX SEED OIL 1000 MG  Take 1 capsule 2  times daily.   FLONASE nasal spray PLACE 1-2 SPRAYS INTO NOSTRILS DAILY.   furosemide  40 MG tablet Take  1 tablet  2 x /day as needed    gabapentin  600 MG tablet Take 1 tablet  3  times daily.   Magnesium 250 MG TABS Take 2 tabs /daily.   olmesartan 40 MG tablet Take one  tablet daily    omeprazole  20 MG capsule TAKE 1 CAPSULE TWICE DAILY AS NEEDED    oxyCODONE IR 5 MG  Take  every 4 hours as needed for severe pain.   sildenafil (VIAGRA) 100 MG tablet Take 1 tablet  as needed    testosterone cypio 200 MG/ML inj INJECT 2 ML IM EVERY 2 WEEKS   vitamin B-12  1000 MCG tab Take  daily.   VITAMIN 5,000 Units Take  daily.     Allergies  Allergen Reactions   Other Hives    Beef-hives  RED MEAT   Aspirin Hives and Rash   Salicylates Rash     Past Medical History:  Diagnosis Date   Allergy    Anxiety    takes Valium daily as needed   Arthritis    in neck   BPH (benign prostatic hyperplasia)    takes Flomax daily   Brachial radiculitis 08/14/2013   Chronic back pain    DDD   COPD (chronic obstructive pulmonary disease) (HCC)    Diverticulosis    Foot drop    both feet   GERD (gastroesophageal reflux disease)    takes Omeprazole daily as well as Zantac   Hyperlipidemia    takes Atorvastatin daily   Hypertension    Hypogonadism male    Nocturia    Numbness    only in 4th and 5th finger on left hand and left leg   Prediabetes 12/26/2018   Reflux esophagitis 08/13/2013    Seasonal allergies    takes Claritin daily as needed and uses Flonase daily as needed   Urinary frequency    takes Flomax daily   Wears hearing aid 10/10/2019     Health Maintenance  Topic Date Due   COVID-19 Vaccine (4 - Booster for Pfizer series) 08/21/2020   TETANUS/TDAP  05/27/2024   COLONOSCOPY  10/08/2028   Pneumonia Vaccine 57+ Years old  Completed   INFLUENZA VACCINE  Completed   Hepatitis C Screening  Completed   Zoster Vaccines- Shingrix  Completed   HPV VACCINES  Aged Out     Immunization History  Administered Date(s) Administered   Influenza  06/18/2015   Influenza, High Dose  109/25/2018, 07/31/2018, 08/18/2020, 07/06/2021   Influenza-Unspecified 08/17/2014, 07/10/2019   PFIZER SARS-COV-2 Vacc 10/18/2019, 11/08/2019, 06/26/2020   PPD Test 05/27/2014   Pneumococcal -13 12/09/2015   Pneumococcal -23 04/08/2013, 06/06/2017   Td 09/13/2003   Tdap 05/27/2014   Zoster Recombinat (Shingrix) 11/29/2017, 02/08/2018    Last Colon - 10/08/2018 - Dr Loletha Carrow  -Diverticulosis - Recc no f/u due to age    Past Surgical History:  Procedure Laterality Date   BACK SURGERY  2016 x 2 L3-L5, 12/2016 S1   lumbar fusion x 3   CERVICAL FUSION  2011/2012   COLONOSCOPY     KNEE ARTHROSCOPY     right 1985; left 1992   PROSTATE SURGERY  10/07/2019   Rezium water vapor procedure by Dr. Gloriann Loan   SEPTOPLASTY     SHOULDER ARTHROSCOPY Left 2015   SHOULDER ARTHROSCOPY WITH ROTATOR CUFF REPAIR Right 08/18/2017   Dr. Onnie Graham   SHOULDER SURGERY Right 08   SPINAL CORD STIMULATOR IMPLANT  08/21/2020   Dr. Blair Hailey     Family History  Problem Relation Age of Onset   Leukemia Mother    Diabetes Mother    Hypertension Father    Diabetes Father    Colon cancer Neg Hx    Stomach cancer Neg  Hx    Esophageal cancer Neg Hx    Rectal cancer Neg Hx      Social History   Tobacco Use   Smoking status: Former    Types: Cigarettes    Quit date: 05/28/1995    Years since quitting:  26.2   Smokeless tobacco: Never   Tobacco comments:    quit smoking >20+yrs ago  Vaping Use   Vaping Use: Never used  Substance Use Topics   Alcohol use: Not Currently    Alcohol/week: 0.0 standard drinks    Comment: rarely   Drug use: No      ROS Constitutional: Denies fever, chills, weight loss/gain, headaches, insomnia,  night sweats or change in appetite. Does c/o fatigue. Eyes: Denies redness, blurred vision, diplopia, discharge, itchy or watery eyes.  ENT: Denies discharge, congestion, post nasal drip, epistaxis, sore throat, earache, hearing loss, dental pain, Tinnitus, Vertigo, Sinus pain or snoring.  Cardio: Denies chest pain, palpitations, irregular heartbeat, syncope, dyspnea, diaphoresis, orthopnea, PND, claudication or edema Respiratory: denies cough, dyspnea, DOE, pleurisy, hoarseness, laryngitis or wheezing.  Gastrointestinal: Denies dysphagia, heartburn, reflux, water brash, pain, cramps, nausea, vomiting, bloating, diarrhea, constipation, hematemesis, melena, hematochezia, jaundice or hemorrhoids Genitourinary: Denies dysuria, frequency, urgency, nocturia, hesitancy, discharge, hematuria or flank pain Musculoskeletal: Denies arthralgia, myalgia, stiffness, Jt. Swelling, pain, limp or strain/sprain. Denies Falls. Skin: Denies puritis, rash, hives, warts, acne, eczema or change in skin lesion Neuro: No weakness, tremor, incoordination, spasms, paresthesia or pain Psychiatric: Denies confusion, memory loss or sensory loss. Denies Depression. Endocrine: Denies change in weight, skin, hair change, nocturia, and paresthesia, diabetic polys, visual blurring or hyper / hypo glycemic episodes.  Heme/Lymph: No excessive bleeding, bruising or enlarged lymph nodes.   Physical Exam  BP 114/78   Pulse (!) 57   Temp 97.8 F (36.6 C)   Resp 16   Ht 6\' 2"  (1.88 m)   Wt 194 lb 6.4 oz (88.2 kg)   SpO2 97%   BMI 24.96 kg/m   General Appearance: Well nourished and well groomed  and in no apparent distress.  Eyes: PERRLA, EOMs, conjunctiva no swelling or erythema, normal fundi and vessels. Sinuses: No frontal/maxillary tenderness ENT/Mouth: EACs patent / TMs  nl. Nares clear without erythema, swelling, mucoid exudates. Oral hygiene is good. No erythema, swelling, or exudate. Tongue normal, non-obstructing. Tonsils not swollen or erythematous. Hearing normal.  Neck: Supple, thyroid not palpable. No bruits, nodes or JVD. Respiratory: Respiratory effort normal.  BS equal and clear bilateral without rales, rhonci, wheezing or stridor. Cardio: Heart sounds are normal with regular rate and rhythm and no murmurs, rubs or gallops. Peripheral pulses are normal and equal bilaterally without edema. No aortic or femoral bruits. Chest: symmetric with normal excursions and percussion.  Abdomen: Soft, with Nl bowel sounds. Nontender, no guarding, rebound, hernias, masses, or organomegaly.  Lymphatics: Non tender without lymphadenopathy.  Musculoskeletal: Full ROM all peripheral extremities, joint stability, 5/5 strength, and normal gait. Skin: Warm and dry without rashes, lesions, cyanosis, clubbing or  ecchymosis.  Neuro: Cranial nerves intact, reflexes equal bilaterally. Normal muscle tone, no cerebellar symptoms. Sensation intact.  Pysch: Alert and oriented X 3 with normal affect, insight and judgment appropriate.   Assessment and Plan  1. Annual Preventative/Screening Exam   2. Essential hypertension  - EKG 12-Lead - Urinalysis, Routine w reflex microscopic - Microalbumin / creatinine urine ratio - CBC with Differential/Platelet - COMPLETE METABOLIC PANEL WITH GFR - Magnesium - TSH  3. Hyperlipidemia, mixed  - EKG  12-Lead - Lipid panel - TSH  4. Abnormal glucose  - EKG 12-Lead - Hemoglobin A1c - Insulin, random  5. Vitamin D deficiency  - VITAMIN D 25 Hydroxy   6. Aortic atherosclerosis ([place]) by Abd CT scan in 05/2015  - EKG 12-Lead - Lipid panel  7.  Gastroesophageal reflux disease without esophagitis  - CBC with Differential/Platelet  8. Testosterone deficiency  - Testosterone  9. Chronic pain syndrome   10. Screening for colorectal cancer  - POC Hemoccult Bld/Stl   11. BPH with obstruction/lower urinary tract symptoms   12. Screening for ischemic heart disease  - EKG 12-Lead  13. Family history of hypertension  - EKG 12-Lead  14. Former smoker  - EKG 12-Lead  15. Medication management  - Urinalysis, Routine w reflex microscopic - Microalbumin / creatinine urine ratio - Testosterone - CBC with Differential/Platelet - COMPLETE METABOLIC PANEL WITH GFR - Magnesium - Lipid panel - TSH - Hemoglobin A1c - Insulin, random - VITAMIN D 25 Hydroxy           Patient was counseled in prudent diet, weight control to achieve/maintain BMI less than 25, BP monitoring, regular exercise and medications as discussed.  Discussed med effects and SE's. Routine screening labs and tests as requested with regular follow-up as recommended. Over 40 minutes of exam, counseling, chart review and high complex critical decision making was performed   Kirtland Bouchard, MD

## 2021-08-22 NOTE — Patient Instructions (Signed)

## 2021-08-23 ENCOUNTER — Ambulatory Visit: Payer: Medicare Other | Admitting: Internal Medicine

## 2021-08-23 ENCOUNTER — Encounter: Payer: Self-pay | Admitting: Internal Medicine

## 2021-08-23 ENCOUNTER — Other Ambulatory Visit: Payer: Self-pay

## 2021-08-23 VITALS — BP 114/78 | HR 57 | Temp 97.8°F | Resp 16 | Ht 74.0 in | Wt 194.4 lb

## 2021-08-23 DIAGNOSIS — Z136 Encounter for screening for cardiovascular disorders: Secondary | ICD-10-CM | POA: Diagnosis not present

## 2021-08-23 DIAGNOSIS — R7309 Other abnormal glucose: Secondary | ICD-10-CM

## 2021-08-23 DIAGNOSIS — Z0001 Encounter for general adult medical examination with abnormal findings: Secondary | ICD-10-CM

## 2021-08-23 DIAGNOSIS — G894 Chronic pain syndrome: Secondary | ICD-10-CM

## 2021-08-23 DIAGNOSIS — Z87891 Personal history of nicotine dependence: Secondary | ICD-10-CM

## 2021-08-23 DIAGNOSIS — K219 Gastro-esophageal reflux disease without esophagitis: Secondary | ICD-10-CM

## 2021-08-23 DIAGNOSIS — N401 Enlarged prostate with lower urinary tract symptoms: Secondary | ICD-10-CM

## 2021-08-23 DIAGNOSIS — N138 Other obstructive and reflux uropathy: Secondary | ICD-10-CM

## 2021-08-23 DIAGNOSIS — Z79899 Other long term (current) drug therapy: Secondary | ICD-10-CM

## 2021-08-23 DIAGNOSIS — E349 Endocrine disorder, unspecified: Secondary | ICD-10-CM

## 2021-08-23 DIAGNOSIS — I10 Essential (primary) hypertension: Secondary | ICD-10-CM | POA: Diagnosis not present

## 2021-08-23 DIAGNOSIS — I700 Atherosclerosis of aorta: Secondary | ICD-10-CM

## 2021-08-23 DIAGNOSIS — Z8249 Family history of ischemic heart disease and other diseases of the circulatory system: Secondary | ICD-10-CM

## 2021-08-23 DIAGNOSIS — E559 Vitamin D deficiency, unspecified: Secondary | ICD-10-CM

## 2021-08-23 DIAGNOSIS — Z0000 Encounter for general adult medical examination without abnormal findings: Secondary | ICD-10-CM | POA: Diagnosis not present

## 2021-08-23 DIAGNOSIS — Z1211 Encounter for screening for malignant neoplasm of colon: Secondary | ICD-10-CM

## 2021-08-23 DIAGNOSIS — E782 Mixed hyperlipidemia: Secondary | ICD-10-CM

## 2021-08-24 LAB — URINALYSIS, ROUTINE W REFLEX MICROSCOPIC
Bacteria, UA: NONE SEEN /HPF
Bilirubin Urine: NEGATIVE
Glucose, UA: NEGATIVE
Hgb urine dipstick: NEGATIVE
Nitrite: NEGATIVE
Protein, ur: NEGATIVE
RBC / HPF: NONE SEEN /HPF (ref 0–2)
Specific Gravity, Urine: 1.015 (ref 1.001–1.035)
Squamous Epithelial / HPF: NONE SEEN /HPF (ref <=5)
WBC, UA: NONE SEEN /HPF (ref 0–5)
pH: <=5 (ref 5.0–8.0)

## 2021-08-24 LAB — MICROSCOPIC MESSAGE

## 2021-08-24 LAB — CBC WITH DIFFERENTIAL/PLATELET
Absolute Monocytes: 373 cells/uL (ref 200–950)
Basophils Absolute: 41 cells/uL (ref 0–200)
Basophils Relative: 1 %
Eosinophils Absolute: 119 cells/uL (ref 15–500)
Eosinophils Relative: 2.9 %
HCT: 43.2 % (ref 38.5–50.0)
Hemoglobin: 14.6 g/dL (ref 13.2–17.1)
Lymphs Abs: 1378 cells/uL (ref 850–3900)
MCH: 33.1 pg — ABNORMAL HIGH (ref 27.0–33.0)
MCHC: 33.8 g/dL (ref 32.0–36.0)
MCV: 98 fL (ref 80.0–100.0)
MPV: 10.8 fL (ref 7.5–12.5)
Monocytes Relative: 9.1 %
Neutro Abs: 2189 cells/uL (ref 1500–7800)
Neutrophils Relative %: 53.4 %
Platelets: 238 10*3/uL (ref 140–400)
RBC: 4.41 10*6/uL (ref 4.20–5.80)
RDW: 12.2 % (ref 11.0–15.0)
Total Lymphocyte: 33.6 %
WBC: 4.1 10*3/uL (ref 3.8–10.8)

## 2021-08-24 LAB — COMPLETE METABOLIC PANEL WITH GFR
AG Ratio: 1.3 (calc) (ref 1.0–2.5)
ALT: 25 U/L (ref 9–46)
AST: 18 U/L (ref 10–35)
Albumin: 4.1 g/dL (ref 3.6–5.1)
Alkaline phosphatase (APISO): 78 U/L (ref 35–144)
BUN/Creatinine Ratio: 14 (calc) (ref 6–22)
BUN: 20 mg/dL (ref 7–25)
CO2: 27 mmol/L (ref 20–32)
Calcium: 9.3 mg/dL (ref 8.6–10.3)
Chloride: 104 mmol/L (ref 98–110)
Creat: 1.39 mg/dL — ABNORMAL HIGH (ref 0.70–1.28)
Globulin: 3.1 g/dL (calc) (ref 1.9–3.7)
Glucose, Bld: 81 mg/dL (ref 65–99)
Potassium: 4.7 mmol/L (ref 3.5–5.3)
Sodium: 137 mmol/L (ref 135–146)
Total Bilirubin: 0.4 mg/dL (ref 0.2–1.2)
Total Protein: 7.2 g/dL (ref 6.1–8.1)
eGFR: 55 mL/min/{1.73_m2} — ABNORMAL LOW (ref >=60)

## 2021-08-24 LAB — LIPID PANEL
Cholesterol: 162 mg/dL (ref <200)
HDL: 39 mg/dL — ABNORMAL LOW (ref >=40)
LDL Cholesterol (Calc): 95 mg/dL (calc)
Non-HDL Cholesterol (Calc): 123 mg/dL (calc) (ref <130)
Total CHOL/HDL Ratio: 4.2 (calc) (ref <5.0)
Triglycerides: 181 mg/dL — ABNORMAL HIGH (ref <150)

## 2021-08-24 LAB — MICROALBUMIN / CREATININE URINE RATIO
Creatinine, Urine: 179 mg/dL (ref 20–320)
Microalb Creat Ratio: 4 mcg/mg creat (ref <30)
Microalb, Ur: 0.7 mg/dL

## 2021-08-24 LAB — TESTOSTERONE: Testosterone: 1793 ng/dL — ABNORMAL HIGH (ref 250–827)

## 2021-08-24 LAB — HEMOGLOBIN A1C
Hgb A1c MFr Bld: 5.5 % of total Hgb (ref <5.7)
Mean Plasma Glucose: 111 mg/dL
eAG (mmol/L): 6.2 mmol/L

## 2021-08-24 LAB — TSH: TSH: 4.68 mIU/L — ABNORMAL HIGH (ref 0.40–4.50)

## 2021-08-24 LAB — INSULIN, RANDOM: Insulin: 7.7 u[IU]/mL

## 2021-08-24 LAB — MAGNESIUM: Magnesium: 2.1 mg/dL (ref 1.5–2.5)

## 2021-08-24 LAB — VITAMIN D 25 HYDROXY (VIT D DEFICIENCY, FRACTURES): Vit D, 25-Hydroxy: 63 ng/mL (ref 30–100)

## 2021-08-24 NOTE — Progress Notes (Signed)
============================================================ °-   Test results slightly outside the reference range are not unusual. If there is anything important, I will review this with you,  otherwise it is considered normal test values.  If you have further questions,  please do not hesitate to contact me at the office or via My Chart.  ============================================================ ============================================================  -  Testosterone is Extremely high from recent shot        - and may increase risk for heart Attacks Strokes & Blood Clots,   So . . . . .   -Recommend change your Testosterone to 1 cc or 1 ml every week  ============================================================ ============================================================  -  Kidney functions are Worse   - Dropped down from Normal ( Stage 0 ) to Stage 3a, So . . . . .   - Kidney functions look a little dehydrated    Very important to drink adequate amounts of fluids to prevent permanent damage    - Recommend drink at least 6 bottles (16 ounces) of fluids /water /day = 96 Oz ~100 oz  - 100 oz = 3,000 cc or 3 liters / day  - >> That's 1 &1/2 bottles of a 2 liter soda bottle /day !   ============================================================ ============================================================  -  Total Chol = 162   &   LDL Chol = 95   - Both  Excellent   !   - Very low risk for Heart Attack  / Stroke ============================================================ ============================================================  -  TSH  / Thyroid test is slightly low normal   - Will continue to monitor for now before add another pill to take  ============================================================ ============================================================  -  A1c - Normal - No Diabetes - Great  ! ============================================================ ============================================================  -  Vitamin D = 63 - Excellent    ! ============================================================ ============================================================  -  All Else - CBC - Kidneys - Electrolytes - Liver - Magnesium & Thyroid    - all  Normal / OK ============================================================ ============================================================  -  Keep up the [place] Work !   & have a [place] Christmas !  ============================================================ ============================================================

## 2021-08-31 ENCOUNTER — Other Ambulatory Visit: Payer: Self-pay | Admitting: Internal Medicine

## 2021-09-20 ENCOUNTER — Other Ambulatory Visit: Payer: Self-pay

## 2021-09-20 DIAGNOSIS — Z1212 Encounter for screening for malignant neoplasm of rectum: Secondary | ICD-10-CM | POA: Diagnosis not present

## 2021-09-20 DIAGNOSIS — Z1211 Encounter for screening for malignant neoplasm of colon: Secondary | ICD-10-CM

## 2021-09-20 LAB — POC HEMOCCULT BLD/STL (HOME/3-CARD/SCREEN)
Card #2 Fecal Occult Blod, POC: NEGATIVE
Card #3 Fecal Occult Blood, POC: NEGATIVE
Fecal Occult Blood, POC: NEGATIVE

## 2021-10-04 ENCOUNTER — Other Ambulatory Visit: Payer: Self-pay | Admitting: Nurse Practitioner

## 2021-10-21 ENCOUNTER — Encounter: Payer: Self-pay | Admitting: Podiatrist

## 2021-10-21 ENCOUNTER — Ambulatory Visit: Payer: Medicare Other | Admitting: Podiatrist

## 2021-10-21 ENCOUNTER — Other Ambulatory Visit: Payer: Self-pay

## 2021-10-21 ENCOUNTER — Other Ambulatory Visit: Payer: Self-pay | Admitting: Adult Health

## 2021-10-21 DIAGNOSIS — M7750 Other enthesopathy of unspecified foot: Secondary | ICD-10-CM

## 2021-10-21 MED ORDER — TRIAMCINOLONE ACETONIDE 10 MG/ML IJ SUSP: 10.0000 mg | Freq: Once | INTRAMUSCULAR | Status: AC

## 2021-10-21 MED ORDER — TRIAMCINOLONE ACETONIDE 10 MG/ML IJ SUSP
10.0000 mg | Freq: Once | INTRAMUSCULAR | Status: AC
  Administered 2021-10-21: 10 mg

## 2021-10-21 NOTE — Progress Notes (Signed)
Chief Complaint  Patient presents with   Foot Pain    Pt stated that he has an uncomfortable feeling in his feet      HPI: Patient is 71 y.o. male who presents today for generalized discomfort in his left greater than right foot. He relates it feels like there is a balled up or wrinkled sock on the bottom of his foot however this is not the case.  Relates discomfort along the lateral aspect of the left foot in comparison with minimal discomfort right foot.  He has been wearing shoes as slip on's and has flattened down the back of the shoes.  He also states he has not gotten a new pair of shoes in a while.  He does have a spinal cord stimulator and relates generalized numbness in his feet.  He was previously seen here and had circulation studies which were all relatively normal.  This was in September of 2022.  Patient Active Problem List   Diagnosis Date Noted   Seasonal allergic rhinitis 04/22/2021   Spinal cord stimulator in place 08/21/2020 11/10/2020   Chronic pain syndrome 07/08/2020   Opioid dependence ([place]) 05/11/2020   Benign prostatic hyperplasia with nocturia 10/10/2019   Aortic atherosclerosis ([place]) by Abd CT scan in 05/2015 10/09/2019   Cervical fusion syndrome 05/23/2019   Lumbar post-laminectomy syndrome 05/23/2019   Gastroesophageal reflux disease without esophagitis 12/26/2018   Overweight (BMI 25.0-29.9) 01/23/2018   COPD (chronic obstructive pulmonary disease) ([place]) 04/03/2017   S/P lumbar spinal fusion 12/27/2016   Medication management 09/09/2015   Spondylolisthesis of lumbar region 04/28/2015   Testosterone deficiency 11/06/2014   Lumbar degenerative disc disease 09/23/2014   Essential hypertension 08/13/2013   Hyperlipidemia, mixed 08/13/2013   Abnormal glucose (hx of prediabetes) 08/13/2013   Vitamin D deficiency 08/13/2013   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 39 tablet 3   diclofenac (VOLTAREN) 75 MG EC tablet TAKE 1 TABLET 2 X DAY WITH FOOD FOR PAIN & INFLAMMATION. 180 tablet 1   fexofenadine (ALLEGRA) 180 MG tablet TAKE 1 TABLET BY MOUTH EVERY DAY 90 tablet 3   Flaxseed, Linseed, (FLAX SEED OIL) 1000 MG CAPS Take 1 capsule (1,000 mg total) by mouth 2 (two) times daily.  0   fluticasone (FLONASE) 50 MCG/ACT nasal spray PLACE 1-2 SPRAYS INTO BOTH NOSTRILS DAILY. 48 g 3   furosemide (LASIX) 40 MG tablet TAKE 1 TABLET TWICE A DAY FOR FLUID RETENTION AND LEG SWELLIN 180 tablet 1   gabapentin (NEURONTIN) 600 MG tablet Take 1 tablet (600 mg total) by mouth 3 (three) times daily. (Patient not taking: Reported on 08/23/2021) 90 tablet 3   Magnesium 250 MG TABS Take 250 mg by mouth daily.     Needles & Syringes MISC Use for Testosterone inject every 2 weeks. 50 each 3   olmesartan (BENICAR) 40 MG tablet Take one tablet daily for blood pressure 90 tablet 3   oxyCODONE (OXY IR/ROXICODONE) 5 MG immediate release tablet Take 5 mg by mouth every 4 (four) hours as needed for severe pain.     pseudoephedrine (SUDAFED) 120 MG 12 hr tablet Take      1 tablet      2 x /day (every 12 hours)  for Head and Chest Congestion (Patient not taking: Reported on 07/05/2021) 60 tablet 0   sildenafil (VIAGRA) 100 MG tablet Take 1 tablet (100 mg total) by mouth as needed for erectile dysfunction. 30 tablet 1   testosterone cypionate (DEPOTESTOSTERONE CYPIONATE) 200 MG/ML injection INJECT 2 ML INTO MUSCLE EVERY 2 WEEKS 12 mL 0   vitamin B-12 (CYANOCOBALAMIN) 1000 MCG tablet Take 1,000 mcg by mouth daily.     VITAMIN D PO Take 5,000 Units by mouth daily.     No current facility-administered medications on file prior to visit.    Allergies  Allergen Reactions   Other Hives    Beef-hives  RED MEAT   Aspirin Hives and Rash   Salicylates Rash     Review of Systems No fevers, chills, nausea, muscle aches, no difficulty breathing, no calf pain, no chest pain or shortness of breath.   Physical Exam  GENERAL APPEARANCE: Alert, conversant. Appropriately groomed. No acute distress.   VASCULAR: Pedal pulses palpable DP and PT bilateral.  Capillary refill time is immediate to all digits,  Proximal to distal cooling it warm to warm.  Digital perfusion adequate.  Mild swelling at the forefoot is noted bilateral.  Nonpitting in nature.  NEUROLOGIC: sensation is intact to 5.07 monofilament at 2/5 sites bilateral.  Light touch is decreased bilateral, vibratory sensation decreased bilateral  MUSCULOSKELETAL: acceptable muscle strength, tone and stability bilateral.  No gross boney pedal deformities noted.  Mild pallor pain on palpation plantar lateral aspect of the left foot is noted in comparison with the right.  Pain at the fifth metatarsal base region is noted left over right.  DERMATOLOGIC: skin is warm, supple, and dry.  No open lesions noted.  No rash, no pre ulcerative lesions. Digital nails are asymptomatic.      Assessment     ICD-10-CM   1. Tendonitis of foot  M77.50 triamcinolone acetonide (KENALOG) 10 MG/ML injection 10 mg       Plan  Discussed examination findings with the patient.  Discussed that likely it is neuropathy causing the sensations however there could be a tendinitis component as well.  Discussed that we can try an injection of steroid to see if this would help as a therapeutic and diagnostic approach.  The patient agreed I did prepped the skin with alcohol infiltrated 10 mg of Kenalog with Marcaine plain into the lateral aspect of the foot without complication.  I was careful to stay away from the tendon structures themselves.  The patient tolerated this well.  I recommended he obtain some good walking or running shoes and that walking with flatten down heels of the shoes may also be a causative factor in his  pain.  He will obtain some new shoes and will call if the pain does not subside.  I also gave him the name of some topical over-the-counter anti-inflammatories to try as well.  If he would like a prescription version of a topical pain cream he will call back.

## 2021-10-21 NOTE — Patient Instructions (Signed)
Go to [employer] and try out some different running/ walking style shoes-- the brands I like are [employer] or [employer].  Voltaren gel is an over the counter topical gel that you can apply 4 times a day to the sore area of your foot Biofreeze is also a good alternative.

## 2021-11-15 ENCOUNTER — Ambulatory Visit: Payer: Medicare Other | Admitting: Nurse Practitioner

## 2021-11-25 NOTE — Progress Notes (Signed)
Patient ID: Jacob Larsen, male   DOB: 1951/06/02, 71 y.o.   MRN: [id number] ? ?MEDICARE ANNUAL WELLNESS VISIT AND FOLLOW UP ?Assessment:  ? ?Encounter for annual medicare wellness visit ? ?Atherosclerosis of aorta ?Per CT 05/2015 ?Control blood pressure, cholesterol, glucose, increase exercise.  ? ?Essential hypertension ?- continue medications, DASH diet, exercise and monitor at home. Call if greater than 130/80.  ?-     CBC with Differential/Platelet ?-     CMP/GFR ?-     TSH ? ?Chronic obstructive pulmonary disease, unspecified COPD type ([place]) ?Avoid triggers, monitor. Per CXR 2018.  ? ?Reflux esophagitis/GERD ?Continue PPI/H2 blocker, diet discussed ? ?Lumbar degenerative disc disease ?Continue follow up with pain clinic ? ?Mixed hyperlipidemia ?-     Lipid panel ?-continue medications, check lipids, decrease fatty foods, increase activity.  ? ?Abnormal glucose ?Recent A1Cs at goal ?Discussed diet/exercise, weight management  ?Defer A1C; check CMP ? ?Abnormal Thyroid Test ?Recheck TSH today ? ?Seasonal Allergic rhinitis ?Advised to take Fexofenadine and Flonase regularly for the next couple of weeks due to high pollen count.  ?Monitor symptoms ? ?Tendonitis left foot ?Continue to follow with podiatry ? ?Medication management ?Continued ? ?Vitamin D deficiency ?Continue supplement ? ?Testosterone deficiency ?- continue replacement therapy, check testosterone levels as needed.  ? ?S/P lumbar spinal fusion ?Continue follow up ? ?Diverticulosis of large intestine without hemorrhage ?Increase fiber, no symptoms at this time ?UTD colonoscopy next due 09/2028 ? ?Impotence of organic origin ?Continue follow up, current medications via urology  ? ?BPH with nocturia ?Off of meds s/p vapor procedure; managed by [facility] ? ?Spondylolisthesis of lumbar region ?Continue follow up ? ?BMI 26 ?Continue to recommend diet heavy in fruits and veggies and low in animal meats, cheeses, and dairy products, appropriate calorie  intake ?Discuss exercise recommendations routinely ?Continue to monitor weight at each visit ? ?Constipation ?Stop Miralax as it is making BM too liquid ?Start Colace or Senokot and increase fiber intake ? ? ?Over 30 minutes of exam, counseling, chart review, and critical decision making was performed ?Future Appointments  ?Date Time Provider [place]  ?03/03/2022  2:30 PM Unk Pinto, MD [facility] None  ?08/30/2022 10:00 AM Unk Pinto, MD [facility] None  ?11/15/2022  3:00 PM Magda Bernheim, NP [facility] None  ? ? ?Plan:  ? ?During the course of the visit the patient was educated and counseled about appropriate screening and preventive services including:  ? ?Pneumococcal vaccine  ?Influenza vaccine ?Prevnar 13 ?Td vaccine ?Screening electrocardiogram ?Colorectal cancer screening ?Diabetes screening ?Glaucoma screening ?Nutrition counseling  ? ? ?Subjective:  ?Jacob Larsen is a 71 y.o. AA male who presents for Medicare Annual Wellness Visit and 3 month follow up for HTN, hyperlipidemia, prediabetes, and vitamin D Def.  ? ?He is using Miralax and is having some stomach cramping and liquid BM.  ? ?He did have recent ABI which was normal. He has not been using Furosemide and swelling in feet have been controlled with Compression socks and elevation of legs.  ? ?He is having more post nasal drip.  Continues on Fexofenadine and Flonase. ? ?He is managed by Dr. Mallie Mussel Pool/Dr. Luan Pulling for chronic lumbar pain, s/p cervical fusion in 2011 and 2012, and lumbar fusion x 3 in 2016 x 2 and in 2018 - S1 - L3, and is prescribed pain medication through that clinic. Recently on gabapentin 300 mg TID PRN and oxy 5 mg IR QID PRN (recently estimates 2 tabs/day), had spinal stimulator implanted  08/21/2020 by Dr. Blair Hailey and has noted improvement.  ? ?Remote former smoker. He has COPD by imaging, asymptomatic off of inhalers.  ? ?He was recently seen by Dr. Valentina Lucks for tendonitis of foot and was injected with  cortisone. He continues to have some pain on bottom of left foot.  He does also get some numbness and tingling in feet bilaterally.  Continues to wear compression socks and uses Voltaren. Is no longer taking Gabapentin - did not feel like it helped. ? ?BMI is Body mass index is 25.16 kg/m?., he has been working on diet, admits no intentional exercise but generally active around the house for 50% of the day, when back allows. Can walk short distances, takes dog out. Will rice a bicycle occasionally.  ?Wt Readings from Last 3 Encounters:  ?11/29/21 196 lb (88.9 kg)  ?08/23/21 194 lb 6.4 oz (88.2 kg)  ?07/05/21 190 lb (86.2 kg)  ? ?He has aortic atherosclerosis per CT 05/2015.  ?His blood pressure has been controlled at home, today their BP is BP: 100/60  ?BP Readings from Last 3 Encounters:  ?11/29/21 100/60  ?08/23/21 114/78  ?07/05/21 129/84  ? ? ? ?He does not workout. He denies chest pain, shortness of breath, dizziness.   ? ?He is on cholesterol medication (atorvastatin 40 mg three days a week) and denies myalgias. His cholesterol is at goal. The cholesterol last visit was:   ?Lab Results  ?Component Value Date  ? CHOL 162 08/23/2021  ? HDL 39 (L) 08/23/2021  ? [place] 95 08/23/2021  ? TRIG 181 (H) 08/23/2021  ? CHOLHDL 4.2 08/23/2021  ? ?He has been working on diet and exercise for glucose management (hx of prediabetes A1C 6.0% in 2015, 5.7% in 04/2020), and denies foot ulcerations, hyperglycemia, hypoglycemia , increased appetite, nausea, polydipsia, polyuria, visual disturbances, vomiting and weight loss. Last A1C in the office was:  ?Lab Results  ?Component Value Date  ? HGBA1C 5.5 08/23/2021  ? ?Last GFR ?Lab Results  ?Component Value Date  ? GFRAA 66 02/16/2021  ? ?Patient is on Vitamin D supplement.   ?Lab Results  ?Component Value Date  ? VD25OH 63 08/23/2021  ?   ?He has a history of testosterone deficiency and is on testosterone replacement, last shot was 4 days ago, he injects 400 mg at home q 20 days.   ?He states that the testosterone helps with his energy, libido, muscle mass. ?He has ED and prescribed sildenafil 20 mg PRN which does help.  ?He had rezium water vapor therapy procedure for BPH at [facility] by Dr. Gloriann Loan on 10/07/2019 and dong well since, off of all meds.  ?Lab Results  ?Component Value Date  ? TESTOSTERONE 1,793 (H) 08/23/2021  ? ? ? ?Medication Review: ?Current Outpatient Medications on File Prior to Visit  ?Medication Sig Dispense Refill  ? atorvastatin (LIPITOR) 40 MG tablet TAKE 1 TABLET 3 DAYS A WEEK FOR CHOLESTEROL. 39 tablet 3  ? diclofenac (VOLTAREN) 75 MG EC tablet TAKE 1 TABLET 2 X DAY WITH FOOD FOR PAIN & INFLAMMATION. 180 tablet 1  ? fexofenadine (ALLEGRA) 180 MG tablet TAKE 1 TABLET BY MOUTH EVERY DAY 90 tablet 3  ? Flaxseed, Linseed, (FLAX SEED OIL) 1000 MG CAPS Take 1 capsule (1,000 mg total) by mouth 2 (two) times daily.  0  ? fluticasone (FLONASE) 50 MCG/ACT nasal spray PLACE 1-2 SPRAYS INTO BOTH NOSTRILS DAILY. 48 g 3  ? furosemide (LASIX) 40 MG tablet TAKE 1 TABLET TWICE A  DAY FOR FLUID RETENTION AND LEG SWELLIN 180 tablet 1  ? Magnesium 250 MG TABS Take 250 mg by mouth daily.    ? olmesartan (BENICAR) 40 MG tablet Take one tablet daily for blood pressure 90 tablet 3  ? omeprazole (PRILOSEC) 20 MG capsule TAKE 1 CAPSULE BY MOUTH  TWICE DAILY AS NEEDED FOR  INDIGESTION AND ACID REFLUX 180 capsule 3  ? oxyCODONE (OXY IR/ROXICODONE) 5 MG immediate release tablet Take 5 mg by mouth every 4 (four) hours as needed for severe pain.    ? testosterone cypionate (DEPOTESTOSTERONE CYPIONATE) 200 MG/ML injection INJECT 2 ML INTO MUSCLE EVERY 2 WEEKS 12 mL 0  ? vitamin B-12 (CYANOCOBALAMIN) 1000 MCG tablet Take 1,000 mcg by mouth daily.    ? VITAMIN D PO Take 5,000 Units by mouth daily.    ? gabapentin (NEURONTIN) 600 MG tablet Take 1 tablet (600 mg total) by mouth 3 (three) times daily. (Patient not taking: Reported on 08/23/2021) 90 tablet 3  ? Needles & Syringes MISC Use for  Testosterone inject every 2 weeks. 50 each 3  ? sildenafil (VIAGRA) 100 MG tablet Take 1 tablet (100 mg total) by mouth as needed for erectile dysfunction. 30 tablet 1  ? ?No current facility-administered medicatio

## 2021-11-29 ENCOUNTER — Encounter: Payer: Self-pay | Admitting: Nurse Practitioner

## 2021-11-29 ENCOUNTER — Other Ambulatory Visit: Payer: Self-pay

## 2021-11-29 ENCOUNTER — Ambulatory Visit (INDEPENDENT_AMBULATORY_CARE_PROVIDER_SITE_OTHER): Payer: Medicare Other | Admitting: Nurse Practitioner

## 2021-11-29 VITALS — BP 100/60 | HR 77 | Temp 97.9°F | Wt 196.0 lb

## 2021-11-29 DIAGNOSIS — M4316 Spondylolisthesis, lumbar region: Secondary | ICD-10-CM

## 2021-11-29 DIAGNOSIS — Z0000 Encounter for general adult medical examination without abnormal findings: Secondary | ICD-10-CM

## 2021-11-29 DIAGNOSIS — E782 Mixed hyperlipidemia: Secondary | ICD-10-CM | POA: Diagnosis not present

## 2021-11-29 DIAGNOSIS — J449 Chronic obstructive pulmonary disease, unspecified: Secondary | ICD-10-CM

## 2021-11-29 DIAGNOSIS — K5730 Diverticulosis of large intestine without perforation or abscess without bleeding: Secondary | ICD-10-CM

## 2021-11-29 DIAGNOSIS — J302 Other seasonal allergic rhinitis: Secondary | ICD-10-CM

## 2021-11-29 DIAGNOSIS — N138 Other obstructive and reflux uropathy: Secondary | ICD-10-CM

## 2021-11-29 DIAGNOSIS — R7989 Other specified abnormal findings of blood chemistry: Secondary | ICD-10-CM

## 2021-11-29 DIAGNOSIS — I10 Essential (primary) hypertension: Secondary | ICD-10-CM | POA: Diagnosis not present

## 2021-11-29 DIAGNOSIS — E663 Overweight: Secondary | ICD-10-CM

## 2021-11-29 DIAGNOSIS — Z0001 Encounter for general adult medical examination with abnormal findings: Secondary | ICD-10-CM | POA: Diagnosis not present

## 2021-11-29 DIAGNOSIS — E349 Endocrine disorder, unspecified: Secondary | ICD-10-CM

## 2021-11-29 DIAGNOSIS — K219 Gastro-esophageal reflux disease without esophagitis: Secondary | ICD-10-CM

## 2021-11-29 DIAGNOSIS — R6889 Other general symptoms and signs: Secondary | ICD-10-CM | POA: Diagnosis not present

## 2021-11-29 DIAGNOSIS — Z79899 Other long term (current) drug therapy: Secondary | ICD-10-CM

## 2021-11-29 DIAGNOSIS — R7309 Other abnormal glucose: Secondary | ICD-10-CM | POA: Diagnosis not present

## 2021-11-29 DIAGNOSIS — N401 Enlarged prostate with lower urinary tract symptoms: Secondary | ICD-10-CM

## 2021-11-29 DIAGNOSIS — I700 Atherosclerosis of aorta: Secondary | ICD-10-CM

## 2021-11-29 DIAGNOSIS — K5903 Drug induced constipation: Secondary | ICD-10-CM

## 2021-11-29 DIAGNOSIS — E559 Vitamin D deficiency, unspecified: Secondary | ICD-10-CM

## 2021-11-29 DIAGNOSIS — M7750 Other enthesopathy of unspecified foot: Secondary | ICD-10-CM

## 2021-11-29 DIAGNOSIS — Z981 Arthrodesis status: Secondary | ICD-10-CM

## 2021-11-29 DIAGNOSIS — N529 Male erectile dysfunction, unspecified: Secondary | ICD-10-CM

## 2021-11-29 NOTE — Patient Instructions (Signed)
Constipation, Adult ?Please try Colace or senokot stool softener ?Stop Miralax ?Constipation is when a person has trouble pooping (having a bowel movement). When you have this condition, you may poop fewer than 3 times a week. Your poop (stool) may also be dry, hard, or bigger than normal. ?Follow these instructions at home: ?Eating and drinking ? ?Eat foods that have a lot of fiber, such as: ?Fresh fruits and vegetables. ?Whole grains. ?Beans. ?Eat less of foods that are low in fiber and high in fat and sugar, such as: ?[place] fries. ?Hamburgers. ?Cookies. ?Candy. ?Soda. ?Drink enough fluid to keep your pee (urine) pale yellow. ?General instructions ?Exercise regularly or as told by your doctor. Try to do 150 minutes of exercise each week. ?Go to the restroom when you feel like you need to poop. Do not hold it in. ?Take over-the-counter and prescription medicines only as told by your doctor. These include any fiber supplements. ?When you poop: ?Do deep breathing while relaxing your lower belly (abdomen). ?Relax your pelvic floor. The pelvic floor is a group of muscles that support the rectum, bladder, and intestines (as well as the uterus in women). ?Watch your condition for any changes. Tell your doctor if you notice any. ?Keep all follow-up visits as told by your doctor. This is important. ?Contact a doctor if: ?You have pain that gets worse. ?You have a fever. ?You have not pooped for 4 days. ?You vomit. ?You are not hungry. ?You lose weight. ?You are bleeding from the opening of the butt (anus). ?You have thin, pencil-like poop. ?Get help right away if: ?You have a fever, and your symptoms suddenly get worse. ?You leak poop or have blood in your poop. ?Your belly feels hard or bigger than normal (bloated). ?You have very bad belly pain. ?You feel dizzy or you faint. ?Summary ?Constipation is when a person poops fewer than 3 times a week, has trouble pooping, or has poop that is dry, hard, or bigger than  normal. ?Eat foods that have a lot of fiber. ?Drink enough fluid to keep your pee (urine) pale yellow. ?Take over-the-counter and prescription medicines only as told by your doctor. These include any fiber supplements. ?This information is not intended to replace advice given to you by your health care provider. Make sure you discuss any questions you have with your health care provider. ?Document Revised: 07/17/2019 Document Reviewed: 07/17/2019 ?Elsevier Patient Education ? [place]. ? ?

## 2021-11-30 LAB — CBC WITH DIFFERENTIAL/PLATELET
Absolute Monocytes: 356 cells/uL (ref 200–950)
Basophils Absolute: 18 cells/uL (ref 0–200)
Basophils Relative: 0.4 %
Eosinophils Absolute: 117 cells/uL (ref 15–500)
Eosinophils Relative: 2.6 %
HCT: 40.4 % (ref 38.5–50.0)
Hemoglobin: 14.1 g/dL (ref 13.2–17.1)
Lymphs Abs: 1400 cells/uL (ref 850–3900)
MCH: 33.3 pg — ABNORMAL HIGH (ref 27.0–33.0)
MCHC: 34.9 g/dL (ref 32.0–36.0)
MCV: 95.5 fL (ref 80.0–100.0)
MPV: 10.9 fL (ref 7.5–12.5)
Monocytes Relative: 7.9 %
Neutro Abs: 2610 cells/uL (ref 1500–7800)
Neutrophils Relative %: 58 %
Platelets: 263 10*3/uL (ref 140–400)
RBC: 4.23 10*6/uL (ref 4.20–5.80)
RDW: 13.2 % (ref 11.0–15.0)
Total Lymphocyte: 31.1 %
WBC: 4.5 10*3/uL (ref 3.8–10.8)

## 2021-11-30 LAB — LIPID PANEL
Cholesterol: 148 mg/dL (ref <200)
HDL: 40 mg/dL (ref >=40)
LDL Cholesterol (Calc): 75 mg/dL (calc)
Non-HDL Cholesterol (Calc): 108 mg/dL (calc) (ref <130)
Total CHOL/HDL Ratio: 3.7 (calc) (ref <5.0)
Triglycerides: 254 mg/dL — ABNORMAL HIGH (ref <150)

## 2021-11-30 LAB — COMPLETE METABOLIC PANEL WITH GFR
AG Ratio: 1.5 (calc) (ref 1.0–2.5)
ALT: 24 U/L (ref 9–46)
AST: 19 U/L (ref 10–35)
Albumin: 4 g/dL (ref 3.6–5.1)
Alkaline phosphatase (APISO): 87 U/L (ref 35–144)
BUN: 16 mg/dL (ref 7–25)
CO2: 24 mmol/L (ref 20–32)
Calcium: 9.2 mg/dL (ref 8.6–10.3)
Chloride: 107 mmol/L (ref 98–110)
Creat: 1.16 mg/dL (ref 0.70–1.28)
Globulin: 2.6 g/dL (calc) (ref 1.9–3.7)
Glucose, Bld: 101 mg/dL — ABNORMAL HIGH (ref 65–99)
Potassium: 4.5 mmol/L (ref 3.5–5.3)
Sodium: 139 mmol/L (ref 135–146)
Total Bilirubin: 0.3 mg/dL (ref 0.2–1.2)
Total Protein: 6.6 g/dL (ref 6.1–8.1)
eGFR: 67 mL/min/{1.73_m2} (ref >=60)

## 2021-11-30 LAB — TSH: TSH: 2.3 mIU/L (ref 0.40–4.50)

## 2022-01-24 ENCOUNTER — Other Ambulatory Visit: Payer: Self-pay | Admitting: Internal Medicine

## 2022-01-24 MED ORDER — TESTOSTERONE CYPIONATE 200 MG/ML IM SOLN: INTRAMUSCULAR | 1 refills | Status: DC

## 2022-02-24 ENCOUNTER — Ambulatory Visit (INDEPENDENT_AMBULATORY_CARE_PROVIDER_SITE_OTHER): Payer: Medicare Other | Admitting: Podiatry

## 2022-02-24 DIAGNOSIS — T148XXA Other injury of unspecified body region, initial encounter: Secondary | ICD-10-CM | POA: Diagnosis not present

## 2022-02-24 DIAGNOSIS — M722 Plantar fascial fibromatosis: Secondary | ICD-10-CM

## 2022-02-25 ENCOUNTER — Telehealth: Payer: Self-pay | Admitting: Podiatry

## 2022-02-25 NOTE — Telephone Encounter (Signed)
Pt called and the boot he was given and it is causing him right side pain(hx of 3 back surgeries) He states it is causing him to be off balance and would like to know what you would recommend.

## 2022-02-25 NOTE — Progress Notes (Signed)
Subjective:  Patient ID: Jacob Larsen, male    DOB: [birth year],  MRN: [id number]  Chief Complaint  Patient presents with   Foot Pain    71 y.o. male presents with the above complaint.  Patient presents with complaint of left lateral band of the plantar fascia pain versus bone bruise/tenderness on the outside scar of the foot.  Patient states it hurts with ambulation and hurts with taking every step at when he is standing on his foot.  He does good amount of work on his foot.  He would like to discuss treatment options and care.  He has received injection in the past which helped some.  He has never been immobilized.  He denies any other acute complaints he has not seen anyone else prior to seeing me.   Review of Systems: Negative except as noted in the HPI. Denies N/V/F/Ch.  Past Medical History:  Diagnosis Date   Allergy    Anxiety    takes Valium daily as needed   Arthritis    in neck   BPH (benign prostatic hyperplasia)    takes Flomax daily   Brachial radiculitis 08/14/2013   Chronic back pain    DDD   COPD (chronic obstructive pulmonary disease) (HCC)    Diverticulosis    Foot drop    both feet   GERD (gastroesophageal reflux disease)    takes Omeprazole daily as well as Zantac   Hyperlipidemia    takes Atorvastatin daily   Hypertension    Hypogonadism male    Nocturia    Numbness    only in 4th and 5th finger on left hand and left leg   Prediabetes 12/26/2018   Reflux esophagitis 08/13/2013   Seasonal allergies    takes Claritin daily as needed and uses Flonase daily as needed   Urinary frequency    takes Flomax daily   Wears hearing aid 10/10/2019    Current Outpatient Medications:    atorvastatin (LIPITOR) 40 MG tablet, TAKE 1 TABLET 3 DAYS A WEEK FOR CHOLESTEROL., Disp: 39 tablet, Rfl: 3   diclofenac (VOLTAREN) 75 MG EC tablet, TAKE 1 TABLET 2 X DAY WITH FOOD FOR PAIN & INFLAMMATION., Disp: 180 tablet, Rfl: 1   fexofenadine (ALLEGRA) 180 MG tablet, TAKE 1  TABLET BY MOUTH EVERY DAY, Disp: 90 tablet, Rfl: 3   Flaxseed, Linseed, (FLAX SEED OIL) 1000 MG CAPS, Take 1 capsule (1,000 mg total) by mouth 2 (two) times daily., Disp: , Rfl: 0   fluticasone (FLONASE) 50 MCG/ACT nasal spray, PLACE 1-2 SPRAYS INTO BOTH NOSTRILS DAILY., Disp: 48 g, Rfl: 3   gabapentin (NEURONTIN) 600 MG tablet, Take 1 tablet (600 mg total) by mouth 3 (three) times daily. (Patient not taking: Reported on 08/23/2021), Disp: 90 tablet, Rfl: 3   Magnesium 250 MG TABS, Take 250 mg by mouth daily., Disp: , Rfl:    Needles & Syringes MISC, Use for Testosterone inject every 2 weeks., Disp: 50 each, Rfl: 3   olmesartan (BENICAR) 40 MG tablet, Take one tablet daily for blood pressure, Disp: 90 tablet, Rfl: 3   omeprazole (PRILOSEC) 20 MG capsule, TAKE 1 CAPSULE BY MOUTH  TWICE DAILY AS NEEDED FOR  INDIGESTION AND ACID REFLUX, Disp: 180 capsule, Rfl: 3   oxyCODONE (OXY IR/ROXICODONE) 5 MG immediate release tablet, Take 5 mg by mouth every 4 (four) hours as needed for severe pain., Disp: , Rfl:    sildenafil (VIAGRA) 100 MG tablet, Take 1 tablet (100 mg total) by  mouth as needed for erectile dysfunction., Disp: 30 tablet, Rfl: 1   testosterone cypionate (DEPOTESTOSTERONE CYPIONATE) 200 MG/ML injection, Inject 1 ml into Muscle every 7 days, Disp: 13 mL, Rfl: 1   vitamin B-12 (CYANOCOBALAMIN) 1000 MCG tablet, Take 1,000 mcg by mouth daily., Disp: , Rfl:    VITAMIN D PO, Take 5,000 Units by mouth daily., Disp: , Rfl:   Social History   Tobacco Use  Smoking Status Former   Types: Cigarettes   Quit date: 05/28/1995   Years since quitting: 26.7  Smokeless Tobacco Never  Tobacco Comments   quit smoking >20+yrs ago    Allergies  Allergen Reactions   Other Hives    Beef-hives  RED MEAT   Aspirin Hives and Rash   Salicylates Rash   Objective:  There were no vitals filed for this visit. There is no height or weight on file to calculate BMI. Constitutional Well developed. Well  nourished.  Vascular Dorsalis pedis pulses palpable bilaterally. Posterior tibial pulses palpable bilaterally. Capillary refill normal to all digits.  No cyanosis or clubbing noted. Pedal hair growth normal.  Neurologic Normal speech. Oriented to person, place, and time. Epicritic sensation to light touch grossly present bilaterally.  Dermatologic Nails well groomed and normal in appearance. No open wounds. No skin lesions.  Orthopedic: Pain on palpation to the left lateral band of the plantar fascia/fifth metatarsal.  No pain with range of motion of the fifth MPJ.  No extensor or flexor tendinitis noted.  No pain at the calcaneal tuber medial side.  Negative sural nerve Tinel's sign.   Radiographs: None Assessment:   1. Plantar fasciitis, bilateral   2. Bone bruise    Plan:  Patient was evaluated and treated and all questions answered.  Left lateral [place] fasciitis versus fifth metatarsal bone bruise/tenderness -All questions and concerns were discussed with the patient extensive detail.  This is likely due to the way he is walking on his foot mostly on the outside part of that I discussed with the patient he will benefit from immobilization given that he is hurting around the pretty significant part of the foot.  The cam boot immobilization will allow the pain to be more localized/focalized for steroid injection.  Discussed with patient he states understanding would like to to do cam boot immobilization -Cam boot was dispensed  No follow-ups on file.

## 2022-02-25 NOTE — Telephone Encounter (Signed)
Notified pt of the product he can get from [employer] and he is going to try it. He asked if it was the same as the powersteps that you discussed with him and I told him know powersteps are inserts for your shoes. The even up product would go on the opposite foot that the boot is on and it would make it more stable. 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 I told pt I would send it to him in my chart.

## 2022-03-02 ENCOUNTER — Encounter: Payer: Self-pay | Admitting: Internal Medicine

## 2022-03-02 NOTE — Progress Notes (Signed)
Future Appointments  Date Time Provider Department  03/03/2022           6 mo OV  2:30 PM Unk Pinto, MD [facility]  04/06/2022  4:15 PM Felipa Furnace, DPM [facility]  08/30/2022          CPE 10:00 AM Unk Pinto, MD [facility]  11/15/2022             Wellness  3:00 PM Alycia Rossetti, NP [facility]    History of Present Illness:       This very nice 71 y.o. MBM presents for 3 month follow up with HTN, HLD, Pre-Diabetes and Vitamin D Deficiency.  Patient has GERD controlled by diet /Prilosec. Abd CT  scan in Sept 2016 showed Aortic Atherosclerosis.                                                  Patient has chronic neck & LBP ( DDD /DJD ) and had 2 neck & 4 back surgeries with last L5-S1 Decompression /Fusion in 2018 by Dr Trenton Gammon. He follows with Dr Luan Pulling in Pain Mgmt & is on chronic Opioids & has a spinal cord stimulator ( Dec 2021 - Dr Davy Pique).         Patient is treated for HTN  (2013)  & BP has been controlled at home. Today's BP is at goal - 112/70. Patient has had no complaints of any cardiac type chest pain, palpitations, dyspnea / orthopnea / PND, dizziness, claudication, or dependent edema.       Hyperlipidemia is controlled with diet & Atorvastatin. Patient denies myalgias or other med SE's. Last Lipids were at goal except elevated Trig's :  Lab Results  Component Value Date   CHOL 148 11/29/2021   HDL 40 11/29/2021   LDLCALC 75 11/29/2021   TRIG 254 (H) 11/29/2021   CHOLHDL 3.7 11/29/2021     Also, the patient has  Moderate Obesity (BMI 31+)  and prediabetes (A1c 6.0% /2014)  and has had no symptoms of reactive hypoglycemia, diabetic polys, paresthesias or visual blurring.  Last A1c was normal & at goal :  Lab Results  Component Value Date   HGBA1C 5.5 08/23/2021                                                         Further, the patient also has history of Vitamin D Deficiency and Last vitamin D was at goal :  Lab Results  Component Value Date    VD25OH 63 08/23/2021     Current Outpatient Medications on File Prior to Visit  Medication Sig   atorvastatin (LIPITOR) 40 MG tablet TAKE 1 TABLET 3 DAYS A WEEK FOR CHOLESTEROL.   diclofenac (VOLTAREN) 75 MG EC tablet TAKE 1 TABLET 2 X DAY WITH FOOD FOR PAIN & INFLAMMATION.   fexofenadine (ALLEGRA) 180 MG tablet TAKE 1 TABLET BY MOUTH EVERY DAY   Flaxseed, Linseed, (FLAX SEED OIL) 1000 MG CAPS Take 1 capsule (1,000 mg total) by mouth 2 (two) times daily.   fluticasone (FLONASE) 50 MCG/ACT nasal spray PLACE 1-2 SPRAYS INTO BOTH NOSTRILS DAILY.   Magnesium 250 MG TABS Take 250  mg by mouth daily.   Needles & Syringes MISC Use for Testosterone inject every 2 weeks.   olmesartan (BENICAR) 40 MG tablet Take one tablet daily for blood pressure   omeprazole (PRILOSEC) 20 MG capsule TAKE 1 CAPSULE BY MOUTH  TWICE DAILY AS NEEDED FOR  INDIGESTION AND ACID REFLUX   oxyCODONE (OXY IR/ROXICODONE) 5 MG immediate release tablet Take 5 mg by mouth every 4 (four) hours as needed for severe pain.   testosterone cypionate (DEPOTESTOSTERONE CYPIONATE) 200 MG/ML injection Inject 1 ml into Muscle every 7 days   vitamin B-12 (CYANOCOBALAMIN) 1000 MCG tablet Take 1,000 mcg by mouth daily.   VITAMIN D PO Take 5,000 Units by mouth daily.   gabapentin (NEURONTIN) 600 MG tablet Take 1 tablet (600 mg total) by mouth 3 (three) times daily. (Patient not taking: Reported on 08/23/2021)   sildenafil (VIAGRA) 100 MG tablet Take 1 tablet (100 mg total) by mouth as needed for erectile dysfunction.   tamsulosin (FLOMAX) 0.4 MG CAPS capsule Take 0.4 mg by mouth daily.   No current facility-administered medications on file prior to visit.     Allergies  Allergen Reactions   Other Hives    Beef-hives  RED MEAT   Aspirin Hives and Rash   Salicylates Rash     PMHx:   Past Medical History:  Diagnosis Date   Allergy    Anxiety    takes Valium daily as needed   Arthritis    in neck   BPH (benign prostatic  hyperplasia)    takes Flomax daily   Brachial radiculitis 08/14/2013   Chronic back pain    DDD   COPD (chronic obstructive pulmonary disease) (HCC)    Diverticulosis    Foot drop    both feet   GERD (gastroesophageal reflux disease)    takes Omeprazole daily as well as Zantac   Hyperlipidemia    takes Atorvastatin daily   Hypertension    Hypogonadism male    Nocturia    Numbness    only in 4th and 5th finger on left hand and left leg   Prediabetes 12/26/2018   Reflux esophagitis 08/13/2013   Seasonal allergies    takes Claritin daily as needed and uses Flonase daily as needed   Urinary frequency    takes Flomax daily   Wears hearing aid 10/10/2019     Immunization History  Administered Date(s) Administered   Influenza Split 06/18/2015   Influenza, High Dose Seasonal PF 06/20/2016, 06/06/2017, 07/31/2018, 08/18/2020, 07/06/2021   Influenza-Unspecified 08/17/2014, 07/10/2019   PFIZER(Purple Top)SARS-COV-2 Vaccination 10/18/2019, 11/08/2019, 06/26/2020   PPD Test 05/27/2014   Pfizer Covid-19 Vaccine Bivalent Booster 3yr & up 08/04/2021   Pneumococcal Conjugate-13 12/09/2015   Pneumococcal Polysaccharide-23 04/08/2013, 06/06/2017   Td 09/13/2003   Tdap 05/27/2014   Zoster Recombinat (Shingrix) 11/29/2017, 02/08/2018     Past Surgical History:  Procedure Laterality Date   BACK SURGERY  2016 x 2 L3-L5, 12/2016 S1   lumbar fusion x 3   CERVICAL FUSION  2011/2012   COLONOSCOPY     KNEE ARTHROSCOPY     right 1985; left 1992   PROSTATE SURGERY  10/07/2019   Rezium water vapor procedure by Dr. BGloriann Loan  SEPTOPLASTY     SHOULDER ARTHROSCOPY Left 2015   SHOULDER ARTHROSCOPY WITH ROTATOR CUFF REPAIR Right 08/18/2017   Dr. SOnnie Graham  SHOULDER SURGERY Right 08   SPINAL CORD STIMULATOR IMPLANT  08/21/2020   Dr. DBlair Hailey   FHx:  Reviewed / unchanged  SHx:    Reviewed / unchanged   Systems Review:  Constitutional: Denies fever, chills, wt changes, headaches,  insomnia, fatigue, night sweats, change in appetite. Eyes: Denies redness, blurred vision, diplopia, discharge, itchy, watery eyes.  ENT: Denies discharge, congestion, post nasal drip, epistaxis, sore throat, earache, hearing loss, dental pain, tinnitus, vertigo, sinus pain, snoring.  CV: Denies chest pain, palpitations, irregular heartbeat, syncope, dyspnea, diaphoresis, orthopnea, PND, claudication or edema. Respiratory: denies cough, dyspnea, DOE, pleurisy, hoarseness, laryngitis, wheezing.  Gastrointestinal: Denies dysphagia, odynophagia, heartburn, reflux, water brash, abdominal pain or cramps, nausea, vomiting, bloating, diarrhea, constipation, hematemesis, melena, hematochezia  or hemorrhoids. Genitourinary: Denies dysuria, frequency, urgency, nocturia, hesitancy, discharge, hematuria or flank pain. Musculoskeletal: Denies arthralgias, myalgias, stiffness, jt. swelling, pain, limping or strain/sprain.  Skin: Denies pruritus, rash, hives, warts, acne, eczema or change in skin lesion(s). Neuro: No weakness, tremor, incoordination, spasms, paresthesia or pain. Psychiatric: Denies confusion, memory loss or sensory loss. Endo: Denies change in weight, skin or hair change.  Heme/Lymph: No excessive bleeding, bruising or enlarged lymph nodes.  Physical Exam  BP 112/70   Pulse (!) 58   Temp 97.7 F (36.5 C)   Resp 16   Ht '6\' 2"'$  (1.88 m)   Wt 202 lb 12.8 oz (92 kg)   SpO2 97%   BMI 26.04 kg/m   Appears  well nourished, well groomed  and in no distress.  Eyes: PERRLA, EOMs, conjunctiva no swelling or erythema. Sinuses: No frontal/maxillary tenderness ENT/Mouth: EAC's clear, TM's nl w/o erythema, bulging. Nares clear w/o erythema, swelling, exudates. Oropharynx clear without erythema or exudates. Oral hygiene is good. Tongue normal, non obstructing. Hearing intact.  Neck: Supple. Thyroid not palpable. Car 2+/2+ without bruits, nodes or JVD. Chest: Respirations nl with BS clear & equal  w/o rales, rhonchi, wheezing or stridor.  Cor: Heart sounds normal w/ regular rate and rhythm without sig. murmurs, gallops, clicks or rubs. Peripheral pulses normal and equal  without edema.  Abdomen: Soft & bowel sounds normal. Non-tender w/o guarding, rebound, hernias, masses or organomegaly.  Lymphatics: Unremarkable.  Musculoskeletal: Full ROM all peripheral extremities, joint stability, 5/5 strength and normal gait.  Skin: Warm, dry without exposed rashes, lesions or ecchymosis apparent.  Neuro: Cranial nerves intact, reflexes equal bilaterally. Sensory-motor testing grossly intact. Tendon reflexes grossly intact.  Pysch: Alert & oriented x 3.  Insight and judgement nl & appropriate. No ideations.  Assessment and Plan:  1. Essential hypertension  - Continue medication, monitor blood pressure at home.  - Continue DASH diet.  Reminder to go to the ER if any CP,  SOB, nausea, dizziness, severe HA, changes vision/speech.   - CBC with Differential/Platelet - COMPLETE METABOLIC PANEL WITH GFR - Magnesium - TSH  2. Hyperlipidemia, mixed  - Continue diet/meds, exercise,& lifestyle modifications.  - Continue monitor periodic cholesterol/liver & renal functions    - Lipid panel - TSH  3. Abnormal glucose  - Continue diet, exercise  - Lifestyle modifications.  - Monitor appropriate labs   - Hemoglobin A1c - Insulin, random  4. Vitamin D deficiency  - Continue supplementation   - VITAMIN D 25 Hydroxyl  5. Gastroesophageal reflux disease   - CBC with Differential/Platelet  6. Testosterone deficiency  - Testosterone  7. Medication management  - CBC with Differential/Platelet - COMPLETE METABOLIC PANEL WITH GFR - Magnesium - Lipid panel - TSH - Hemoglobin A1c - Insulin, random - VITAMIN D 25 Hydroxy - Testosterone  Discussed  regular exercise, BP monitoring, weight control to achieve/maintain BMI less than 25 and discussed med and SE's. Recommended labs  to assess /monitor clinical status .  I discussed the assessment and treatment plan with the patient. The patient was provided an opportunity to ask questions and all were answered. The patient agreed with the plan and demonstrated an understanding of the instructions.  I provided over 30 minutes of exam, counseling, chart review and  complex critical decision making.        The patient was advised to call back or seek an in-person evaluation if the symptoms worsen or if the condition fails to improve as anticipated.   Kirtland Bouchard, MD

## 2022-03-02 NOTE — Progress Notes (Signed)
Future Appointments  Date Time Provider [place]  02/16/2021 10:30 AM Unk Pinto, MD [facility] None  08/23/2021 - CPE  2:00 PM Unk Pinto, MD [facility] None  11/15/2021 - Wellness 11:30 AM Liane Comber, NP [facility] None    History of Present Illness:       This very nice 71 y.o. MBM presents for 6 month follow up with HTN, HLD, Pre-Diabetes and Vitamin D Deficiency. Abd CT scan in Sept 2016 showed Aortic Atherosclerosis. Patient has GERD controlled on his meds.                                           Patient has a chronic pain syndrome consequent of 2 neck & 4 back surgeries with last L5-S1 Decompression /Fusion in 2018 by Dr Trenton Gammon. He is followed in pain mgmt and is on maintenance Opioids.   Last year he had a spinal cord stimulator implanted.        Patient is treated for HTN & BP has been controlled at home. Today's BP is at goal -  110/70. Patient has had no complaints of any cardiac type chest pain, palpitations, dyspnea / orthopnea / PND, dizziness, claudication, or dependent edema.       Hyperlipidemia is controlled with diet & meds. Patient denies myalgias or other med SE's. Last Lipids were at goal:   Lab Results  Component Value Date   CHOL 154 11/12/2020   HDL 40 11/12/2020   LDLCALC 92 11/12/2020   TRIG 121 11/12/2020   CHOLHDL 3.9 11/12/2020     Also, the patient has history of modertate obesity (BMI  27.53 ) and  PreDiabetes (A1c 6.0% /2014) and has had no symptoms of reactive hypoglycemia, diabetic polys, paresthesias or visual blurring.  Last A1c was normal & at goal:  Lab Results  Component Value Date   HGBA1C 5.6 08/10/2020   Wt Readings from Last 3 Encounters:  11/29/21 196 lb (88.9 kg)  08/23/21 194 lb 6.4 oz (88.2 kg)  07/05/21 190 lb (86.2 kg)             Further, the patient also has history of Vitamin D Deficiency and supplements vitamin D without any suspected side-effects. Last vitamin D was at goal:  Lab Results   Component Value Date   VD25OH 65 08/10/2020      Patient has Low T & is on parenteral replacement therapy with improved sense of stamina & well being.    Current Outpatient Medications on File Prior to Visit  Medication Sig   atorvastatin (LIPITOR) 40 MG tablet TAKE 1 TABLET 3 DAYS A WEEK FOR CHOLESTEROL.   diclofenac (VOLTAREN) 75 MG EC tablet TAKE 1 TABLET 2 X DAY WITH FOOD FOR PAIN & INFLAMMATION.   fexofenadine (ALLEGRA) 180 MG tablet TAKE 1 TABLET BY MOUTH EVERY DAY   Flaxseed, Linseed, (FLAX SEED OIL) 1000 MG CAPS Take 1 capsule (1,000 mg total) by mouth 2 (two) times daily.   fluticasone (FLONASE) 50 MCG/ACT nasal spray PLACE 1-2 SPRAYS INTO BOTH NOSTRILS DAILY.   gabapentin (NEURONTIN) 600 MG tablet Take 1 tablet (600 mg total) by mouth 3 (three) times daily. (Patient not taking: Reported on 08/23/2021)   Magnesium 250 MG TABS Take 250 mg by mouth daily.   Needles & Syringes MISC Use for Testosterone inject every 2 weeks.   olmesartan (BENICAR) 40 MG tablet Take  one tablet daily for blood pressure   omeprazole (PRILOSEC) 20 MG capsule TAKE 1 CAPSULE BY MOUTH  TWICE DAILY AS NEEDED FOR  INDIGESTION AND ACID REFLUX   oxyCODONE (OXY IR/ROXICODONE) 5 MG immediate release tablet Take 5 mg by mouth every 4 (four) hours as needed for severe pain.   sildenafil (VIAGRA) 100 MG tablet Take 1 tablet (100 mg total) by mouth as needed for erectile dysfunction.   testosterone cypionate (DEPOTESTOSTERONE CYPIONATE) 200 MG/ML injection Inject 1 ml into Muscle every 7 days   vitamin B-12 (CYANOCOBALAMIN) 1000 MCG tablet Take 1,000 mcg by mouth daily.   VITAMIN D PO Take 5,000 Units by mouth daily.   No current facility-administered medications on file prior to visit.     Allergies  Allergen Reactions   Other Hives    Beef-hives     RED MEAT   Aspirin Hives and Rash   Salicylates Rash     PMHx:   Past Medical History:  Diagnosis Date   Allergy    Anxiety    takes Valium daily as  needed   Arthritis    in neck   BPH (benign prostatic hyperplasia)    takes Flomax daily   Brachial radiculitis 08/14/2013   Chronic back pain    DDD   COPD (chronic obstructive pulmonary disease) (HCC)    Diverticulosis    Foot drop    both feet   GERD (gastroesophageal reflux disease)    takes Omeprazole daily as well as Zantac   Hyperlipidemia    takes Atorvastatin daily   Hypertension    Hypogonadism male    Nocturia    Numbness    only in 4th and 5th finger on left hand and left leg   Prediabetes 12/26/2018   Reflux esophagitis 08/13/2013   Seasonal allergies    takes Claritin daily as needed and uses Flonase daily as needed   Urinary frequency    takes Flomax daily   Wears hearing aid 10/10/2019     Immunization History  Administered Date(s) Administered   Influenza Split 06/18/2015   Influenza, High Dose  06/20/2016, 06/06/2017, 07/31/2018, 08/18/2020   Influenza- 08/17/2014, 07/10/2019   PFIZER SARS-COV-2 Vacci 10/18/2019, 11/08/2019, 06/26/2020   PPD Test 05/27/2014   Pneumococcal -13 12/09/2015   Pneumococcal -23 04/08/2013, 06/06/2017   Td 09/13/2003   Tdap 05/27/2014   Zoster Recombinat (Shingrix) 11/29/2017, 02/08/2018     Past Surgical History:  Procedure Laterality Date   BACK SURGERY  2016 x 2 L3-L5, 12/2016 S1   lumbar fusion x 3   CERVICAL FUSION  2011/2012   COLONOSCOPY     KNEE ARTHROSCOPY     right 1985; left 1992   PROSTATE SURGERY  10/07/2019   Rezium water vapor procedure by Dr. Gloriann Loan   SEPTOPLASTY     SHOULDER ARTHROSCOPY Left 2015   SHOULDER ARTHROSCOPY WITH ROTATOR CUFF REPAIR Right 08/18/2017   Dr. Onnie Graham   SHOULDER SURGERY Right 08   SPINAL CORD STIMULATOR IMPLANT  08/21/2020   Dr. Blair Hailey    FHx:    Reviewed / unchanged  SHx:    Reviewed / unchanged   Systems Review:  Constitutional: Denies fever, chills, wt changes, headaches, insomnia, fatigue, night sweats, change in appetite. Eyes: Denies redness, blurred  vision, diplopia, discharge, itchy, watery eyes.  ENT: Denies discharge, congestion, post nasal drip, epistaxis, sore throat, earache, hearing loss, dental pain, tinnitus, vertigo, sinus pain, snoring.  CV: Denies chest pain, palpitations, irregular heartbeat, syncope, dyspnea,  diaphoresis, orthopnea, PND, claudication or edema. Respiratory: denies cough, dyspnea, DOE, pleurisy, hoarseness, laryngitis, wheezing.  Gastrointestinal: Denies dysphagia, odynophagia, heartburn, reflux, water brash, abdominal pain or cramps, nausea, vomiting, bloating, diarrhea, constipation, hematemesis, melena, hematochezia  or hemorrhoids. Genitourinary: Denies dysuria, frequency, urgency, nocturia, hesitancy, discharge, hematuria or flank pain. Musculoskeletal: Denies arthralgias, myalgias, stiffness, jt. swelling, pain, limping or strain/sprain.  Skin: Denies pruritus, rash, hives, warts, acne, eczema or change in skin lesion(s). Neuro: No weakness, tremor, incoordination, spasms, paresthesia or pain. Psychiatric: Denies confusion, memory loss or sensory loss. Endo: Denies change in weight, skin or hair change.  Heme/Lymph: No excessive bleeding, bruising or enlarged lymph nodes.  Physical Exam  There were no vitals taken for this visit.  Appears  Over nourished, well groomed  and in no distress.  Eyes: PERRLA, EOMs, conjunctiva no swelling or erythema. Sinuses: No frontal/maxillary tenderness ENT/Mouth: EAC's clear, TM's nl w/o erythema, bulging. Nares clear w/o erythema, swelling, exudates. Oropharynx clear without erythema or exudates. Oral hygiene is good. Tongue normal, non obstructing. Hearing intact.  Neck: Supple. Thyroid not palpable. Car 2+/2+ without bruits, nodes or JVD. Chest: Respirations nl with BS clear & equal w/o rales, rhonchi, wheezing or stridor.  Cor: Heart sounds normal w/ regular rate and rhythm without sig. murmurs, gallops, clicks or rubs. Peripheral pulses normal and equal  without  edema.  Abdomen: Soft & bowel sounds normal. Non-tender w/o guarding, rebound, hernias, masses or organomegaly.  Lymphatics: Unremarkable.  Musculoskeletal: Full ROM all peripheral extremities, joint stability, 5/5 strength and normal gait.  Skin: Warm, dry without exposed rashes, lesions or ecchymosis apparent.  Neuro: Cranial nerves intact, reflexes equal bilaterally. Sensory-motor testing grossly intact. Tendon reflexes grossly intact.  Pysch: Alert & oriented x 3.  Insight and judgement nl & appropriate. No ideations.  Assessment and Plan:  1. Essential hypertension  - Continue medication, monitor blood pressure at home.  - Continue DASH diet.  Reminder to go to the ER if any CP,  SOB, nausea, dizziness, severe HA, changes vision/speech.  - CBC with Differential/Platelet - COMPLETE METABOLIC PANEL WITH GFR - Magnesium  2.  - Continue diet/meds, exercise,& lifestyle modifications.  - Continue monitor periodic cholesterol/liver & renal functions  Hyperlipidemia, mixed  - Lipid panel - TSH  3. Abnormal glucose  - Continue diet, exercise  - Lifestyle modifications.  - Monitor appropriate labs  - Hemoglobin A1c - Insulin, random  4. Vitamin D deficiency  - Continue supplementation.  - VITAMIN D 25 Hydroxy  5. Testosterone deficiency  - Testosterone  6. Aortic atherosclerosis ([place]) by Abd CT scan in 05/2015  - Lipid panel  7. Medication management  - CBC with Differential/Platelet - COMPLETE METABOLIC PANEL WITH GFR - Magnesium - Lipid panel - TSH - Hemoglobin A1c - Insulin, random - VITAMIN D 25 Hydroxy - Testosterone         Discussed  regular exercise, BP monitoring, weight control to achieve/maintain BMI less than 25 and discussed med and SE's. Recommended labs to assess and monitor clinical status with further disposition pending results of labs.  I discussed the assessment and treatment plan with the patient. The patient was provided an opportunity  to ask questions and all were answered. The patient agreed with the plan and demonstrated an understanding of the instructions.  I provided over 30 minutes of exam, counseling, chart review and  complex critical decision making.        The patient was advised to call back or seek an in-person evaluation  if the symptoms worsen or if the condition fails to improve as anticipated.   Kirtland Bouchard, MD

## 2022-03-02 NOTE — Patient Instructions (Signed)

## 2022-03-03 ENCOUNTER — Other Ambulatory Visit: Payer: Self-pay

## 2022-03-03 ENCOUNTER — Ambulatory Visit: Payer: Medicare Other | Admitting: Internal Medicine

## 2022-03-03 ENCOUNTER — Encounter: Payer: Self-pay | Admitting: Internal Medicine

## 2022-03-03 VITALS — BP 112/70 | HR 58 | Temp 97.7°F | Resp 16 | Ht 74.0 in | Wt 202.8 lb

## 2022-03-03 DIAGNOSIS — R7309 Other abnormal glucose: Secondary | ICD-10-CM

## 2022-03-03 DIAGNOSIS — E782 Mixed hyperlipidemia: Secondary | ICD-10-CM

## 2022-03-03 DIAGNOSIS — Z79899 Other long term (current) drug therapy: Secondary | ICD-10-CM

## 2022-03-03 DIAGNOSIS — E559 Vitamin D deficiency, unspecified: Secondary | ICD-10-CM | POA: Diagnosis not present

## 2022-03-03 DIAGNOSIS — E349 Endocrine disorder, unspecified: Secondary | ICD-10-CM

## 2022-03-03 DIAGNOSIS — I10 Essential (primary) hypertension: Secondary | ICD-10-CM

## 2022-03-03 DIAGNOSIS — K219 Gastro-esophageal reflux disease without esophagitis: Secondary | ICD-10-CM

## 2022-03-03 MED ORDER — NEEDLES & SYRINGES MISC: 3 refills | Status: DC

## 2022-03-04 LAB — CBC WITH DIFFERENTIAL/PLATELET
Absolute Monocytes: 326 cells/uL (ref 200–950)
Basophils Absolute: 20 cells/uL (ref 0–200)
Basophils Relative: 0.4 %
Eosinophils Absolute: 107 cells/uL (ref 15–500)
Eosinophils Relative: 2.1 %
HCT: 41.7 % (ref 38.5–50.0)
Hemoglobin: 14.2 g/dL (ref 13.2–17.1)
Lymphs Abs: 1183 cells/uL (ref 850–3900)
MCH: 33.3 pg — ABNORMAL HIGH (ref 27.0–33.0)
MCHC: 34.1 g/dL (ref 32.0–36.0)
MCV: 97.9 fL (ref 80.0–100.0)
MPV: 10.9 fL (ref 7.5–12.5)
Monocytes Relative: 6.4 %
Neutro Abs: 3463 cells/uL (ref 1500–7800)
Neutrophils Relative %: 67.9 %
Platelets: 262 10*3/uL (ref 140–400)
RBC: 4.26 10*6/uL (ref 4.20–5.80)
RDW: 12.8 % (ref 11.0–15.0)
Total Lymphocyte: 23.2 %
WBC: 5.1 10*3/uL (ref 3.8–10.8)

## 2022-03-04 LAB — COMPLETE METABOLIC PANEL WITH GFR
AG Ratio: 1.4 (calc) (ref 1.0–2.5)
ALT: 21 U/L (ref 9–46)
AST: 17 U/L (ref 10–35)
Albumin: 3.9 g/dL (ref 3.6–5.1)
Alkaline phosphatase (APISO): 77 U/L (ref 35–144)
BUN: 14 mg/dL (ref 7–25)
CO2: 26 mmol/L (ref 20–32)
Calcium: 9.3 mg/dL (ref 8.6–10.3)
Chloride: 106 mmol/L (ref 98–110)
Creat: 1.18 mg/dL (ref 0.70–1.28)
Globulin: 2.8 g/dL (calc) (ref 1.9–3.7)
Glucose, Bld: 115 mg/dL — ABNORMAL HIGH (ref 65–99)
Potassium: 4.5 mmol/L (ref 3.5–5.3)
Sodium: 138 mmol/L (ref 135–146)
Total Bilirubin: 0.3 mg/dL (ref 0.2–1.2)
Total Protein: 6.7 g/dL (ref 6.1–8.1)
eGFR: 66 mL/min/{1.73_m2} (ref >=60)

## 2022-03-04 LAB — HEMOGLOBIN A1C
Hgb A1c MFr Bld: 5.6 % of total Hgb (ref <5.7)
Mean Plasma Glucose: 114 mg/dL
eAG (mmol/L): 6.3 mmol/L

## 2022-03-04 LAB — INSULIN, RANDOM: Insulin: 95.1 u[IU]/mL — ABNORMAL HIGH

## 2022-03-04 LAB — LIPID PANEL
Cholesterol: 150 mg/dL (ref <200)
HDL: 39 mg/dL — ABNORMAL LOW (ref >=40)
LDL Cholesterol (Calc): 82 mg/dL (calc)
Non-HDL Cholesterol (Calc): 111 mg/dL (calc) (ref <130)
Total CHOL/HDL Ratio: 3.8 (calc) (ref <5.0)
Triglycerides: 193 mg/dL — ABNORMAL HIGH (ref <150)

## 2022-03-04 LAB — TSH: TSH: 2.84 mIU/L (ref 0.40–4.50)

## 2022-03-04 LAB — TESTOSTERONE: Testosterone: 1042 ng/dL — ABNORMAL HIGH (ref 250–827)

## 2022-03-04 LAB — VITAMIN D 25 HYDROXY (VIT D DEFICIENCY, FRACTURES): Vit D, 25-Hydroxy: 62 ng/mL (ref 30–100)

## 2022-03-04 LAB — MAGNESIUM: Magnesium: 2.2 mg/dL (ref 1.5–2.5)

## 2022-03-15 ENCOUNTER — Other Ambulatory Visit: Payer: Self-pay | Admitting: Internal Medicine

## 2022-03-15 DIAGNOSIS — I10 Essential (primary) hypertension: Secondary | ICD-10-CM

## 2022-04-04 ENCOUNTER — Other Ambulatory Visit: Payer: Self-pay | Admitting: Adult Health

## 2022-04-06 ENCOUNTER — Ambulatory Visit (INDEPENDENT_AMBULATORY_CARE_PROVIDER_SITE_OTHER): Payer: Medicare Other | Admitting: Podiatry

## 2022-04-06 DIAGNOSIS — M7672 Peroneal tendinitis, left leg: Secondary | ICD-10-CM

## 2022-04-06 DIAGNOSIS — T148XXA Other injury of unspecified body region, initial encounter: Secondary | ICD-10-CM | POA: Diagnosis not present

## 2022-04-12 ENCOUNTER — Encounter: Payer: Self-pay | Admitting: Podiatry

## 2022-04-12 NOTE — Progress Notes (Signed)
Subjective:  Patient ID: Jacob Larsen, male    DOB: 1951-01-24,  MRN: [id number]  Chief Complaint  Patient presents with   Plantar Fasciitis    71 y.o. male presents with the above complaint.  Patient presents with follow-up of left lateral foot pain/fifth metatarsal base/peroneal tendinitis pain.  He states the cam boot immobilization helped considerably still has some residual pain he would like to discuss next treatment plan.  He denies any other acute complaints.   Review of Systems: Negative except as noted in the HPI. Denies N/V/F/Ch.  Past Medical History:  Diagnosis Date   Allergy    Anxiety    takes Valium daily as needed   Arthritis    in neck   BPH (benign prostatic hyperplasia)    takes Flomax daily   Brachial radiculitis 08/14/2013   Chronic back pain    DDD   COPD (chronic obstructive pulmonary disease) (HCC)    Diverticulosis    Foot drop    both feet   GERD (gastroesophageal reflux disease)    takes Omeprazole daily as well as Zantac   Hyperlipidemia    takes Atorvastatin daily   Hypertension    Hypogonadism male    Nocturia    Numbness    only in 4th and 5th finger on left hand and left leg   Prediabetes 12/26/2018   Reflux esophagitis 08/13/2013   Seasonal allergies    takes Claritin daily as needed and uses Flonase daily as needed   Urinary frequency    takes Flomax daily   Wears hearing aid 10/10/2019    Current Outpatient Medications:    atorvastatin (LIPITOR) 40 MG tablet, TAKE 1 TABLET 3 DAYS A WEEK FOR CHOLESTEROL., Disp: 39 tablet, Rfl: 3   diclofenac (VOLTAREN) 75 MG EC tablet, TAKE 1 TABLET 2 X DAY WITH FOOD FOR PAIN & INFLAMMATION., Disp: 180 tablet, Rfl: 1   fexofenadine (ALLEGRA) 180 MG tablet, TAKE 1 TABLET BY MOUTH EVERY DAY, Disp: 90 tablet, Rfl: 3   Flaxseed, Linseed, (FLAX SEED OIL) 1000 MG CAPS, Take 1 capsule (1,000 mg total) by mouth 2 (two) times daily., Disp: , Rfl: 0   fluticasone (FLONASE) 50 MCG/ACT nasal spray, PLACE 1-2  SPRAYS INTO BOTH NOSTRILS DAILY., Disp: 48 g, Rfl: 3   Magnesium 250 MG TABS, Take 250 mg by mouth daily., Disp: , Rfl:    Needles & Syringes MISC, Use for Testosterone inject every 2 weeks., Disp: 50 each, Rfl: 3   olmesartan (BENICAR) 40 MG tablet, Take  1 tablet Daily for BP                                                            /                                      TAKE                                BY                   MOUTH, Disp: 90 tablet, Rfl: 3   omeprazole (PRILOSEC) 20 MG capsule,  TAKE 1 CAPSULE BY MOUTH  TWICE DAILY AS NEEDED FOR  INDIGESTION AND ACID REFLUX, Disp: 180 capsule, Rfl: 3   oxyCODONE (OXY IR/ROXICODONE) 5 MG immediate release tablet, Take 5 mg by mouth every 4 (four) hours as needed for severe pain., Disp: , Rfl:    sildenafil (VIAGRA) 100 MG tablet, Take 1 tablet (100 mg total) by mouth as needed for erectile dysfunction., Disp: 30 tablet, Rfl: 1   tamsulosin (FLOMAX) 0.4 MG CAPS capsule, Take 0.4 mg by mouth daily., Disp: , Rfl:    testosterone cypionate (DEPOTESTOSTERONE CYPIONATE) 200 MG/ML injection, Inject 1 ml into Muscle every 7 days, Disp: 13 mL, Rfl: 1   vitamin B-12 (CYANOCOBALAMIN) 1000 MCG tablet, Take 1,000 mcg by mouth daily., Disp: , Rfl:    VITAMIN D PO, Take 5,000 Units by mouth daily., Disp: , Rfl:   Social History   Tobacco Use  Smoking Status Former   Types: Cigarettes   Quit date: 05/28/1995   Years since quitting: 26.8  Smokeless Tobacco Never  Tobacco Comments   quit smoking >20+yrs ago    Allergies  Allergen Reactions   Other Hives    Beef-hives  RED MEAT   Aspirin Hives and Rash   Salicylates Rash   Objective:  There were no vitals filed for this visit. There is no height or weight on file to calculate BMI. Constitutional Well developed. Well nourished.  Vascular Dorsalis pedis pulses palpable bilaterally. Posterior tibial pulses palpable bilaterally. Capillary refill normal to all digits.  No cyanosis or clubbing  noted. Pedal hair growth normal.  Neurologic Normal speech. Oriented to person, place, and time. Epicritic sensation to light touch grossly present bilaterally.  Dermatologic Nails well groomed and normal in appearance. No open wounds. No skin lesions.  Orthopedic: Pain on palpation to the left lateral band of the plantar fascia/fifth metatarsal.  No pain with range of motion of the fifth MPJ.  No extensor or flexor tendinitis noted.  No pain at the calcaneal tuber medial side.  Negative sural nerve Tinel's sign.   Radiographs: None Assessment:   1. Peroneal tendinitis of lower leg, left   2. Bone bruise     Plan:  Patient was evaluated and treated and all questions answered.  Left lateral Planter fasciitis versus fifth metatarsal bone bruise/tenderness -All questions and concerns were discussed with the patient extensive detail.   -Cam boot immobilization but somewhat of his pain down.  At this time he still has some residual pain therefore I believe patient would benefit from a steroid injection at decreasing inflammatory component associate with pain.  I discussed with patient he states that she would like to proceed with steroid injection -A steroid injection was performed at left lateral foot at point of maximal tenderness using 1% plain Lidocaine and 10 mg of Kenalog. This was well tolerated.   No follow-ups on file.

## 2022-04-22 ENCOUNTER — Other Ambulatory Visit: Payer: Self-pay | Admitting: Internal Medicine

## 2022-07-21 ENCOUNTER — Other Ambulatory Visit: Payer: Self-pay | Admitting: Internal Medicine

## 2022-07-21 DIAGNOSIS — J302 Other seasonal allergic rhinitis: Secondary | ICD-10-CM

## 2022-08-17 ENCOUNTER — Encounter: Payer: Self-pay | Admitting: Internal Medicine

## 2022-08-29 ENCOUNTER — Encounter: Payer: Self-pay | Admitting: Internal Medicine

## 2022-08-29 NOTE — Progress Notes (Unsigned)
Annual  Screening/Preventative Visit  & Comprehensive Evaluation & Examination    Future Appointments  Date Time Provider Department  08/30/2022                     cpe 10:00 AM Jacob Pinto, MD [facility]  11/15/2022                        wellness  3:00 PM Alycia Rossetti, NP [facility]  09/05/2023                     cpe 10:00 AM Jacob Pinto, MD [facility]         This very nice 71 y.o. MBM with    HTN, HLD, Prediabetes and Vitamin D Deficiency  presents for a Screening /Preventative Visit & comprehensive evaluation and management of multiple medical co-morbidities.   Patient has GERD controlled by diet & Prilosec.  Dr Gloriann Loan (Urologist) follows patient for [place] s/p  Rezium vaporization procedure (2021) .  Abd CT  scan in 2016 showed Aortic Atherosclerosis.                                           Patient has DDD /DJD with chronic neck & LBP and had 2 neck & 4 back surgeries with last L5-S1 Decompression /Fusion in 2018 by Dr Trenton Gammon. He follows in Pain Mgmt with Dr Luan Pulling on chronic Opioids & has a spinal cord stimulator ( Dec 20021 - Dr Davy Pique).          HTN predates circa 2013. Patient's BP has been controlled at home.  Today's BP is at goal - 118/80 . Patient denies any cardiac symptoms as chest pain, palpitations, shortness of breath, dizziness or ankle swelling.        Patient's hyperlipidemia is controlled with diet and Atorvastatin Laverle Hobby. Patient denies myalgias or other medication SE's. Last lipids were at goal  except elevated Trig's :  Lab Results  Component Value Date   CHOL 150 03/03/2022   HDL 39 (L) 03/03/2022   LDLCALC 82 03/03/2022   TRIG 193 (H) 03/03/2022   CHOLHDL 3.8 03/03/2022         Patient has Moderate Obesity (BMI 31.1+) with consequent  prediabetes (A1c 6.0% /2014) and patient denies reactive hypoglycemic symptoms, visual blurring, diabetic polys or paresthesias. Last A1c was normal & at goal :   Lab Results  Component  Value Date   HGBA1C 5.6 03/03/2022                                 Patient is on parenteral Testosterone Replacement for Testosterone Deficiency with improved sense of well-being & stamina.        Finally, patient has history of Vitamin D Deficiency & last vitamin D was at goal :   Lab Results  Component Value Date   VD25OH 62 03/03/2022       Current Outpatient Medications:     atorvastatin  40 MG tablet, TAKE 1 TABLET 3 DAYS A WEEK    diclofenac  75 MG EC tablet, TAKE 1 TABLET 2 X DAY    fexofenadine 180 MG tablet, TAKE 1 TABLET EVERY DAY   FLAX SEED OIL 1000 MG CAPS, Take 1 capsule  2  times daily   FLONASE nasal spray, PLACE 1-2 SPRAYS INTO BOTH NOSTRILS DAILY   Magnesium 250 MG TABS, Take  daily   olmesartan 40 MG tablet, Take  1 tablet Daily    omeprazole  20 MG capsule, TAKE 1 CAPSULE   TWICE DAILY    oxyCODONE (OXY IR/ROXICODONE) 5 MG , Take every 4  hours as needed    sildenafil  100 MG tablet, Take 1 tablet  as needed    tamsulosin  0.4 MG CAPS, Take  daily   testosterone cypio 200 MG/ML injection, Inject 1 ml into Muscle every 7 days   vitamin B-121000 MCG tablet, Take  daily   VITAMIN D  5,000 Units daily    Allergies  Allergen Reactions   Other Hives    Beef-hives / RED MEAT   Aspirin Hives and Rash   Salicylates Rash     Past Medical History:  Diagnosis Date   Allergy    Anxiety    takes Valium daily as needed   Arthritis    in neck   BPH (benign prostatic hyperplasia)    takes Flomax daily   Brachial radiculitis 08/14/2013   Chronic back pain    DDD   COPD (chronic obstructive pulmonary disease) (HCC)    Diverticulosis    Foot drop    both feet   GERD (gastroesophageal reflux disease)    takes Omeprazole daily as well as Zantac   Hyperlipidemia    takes Atorvastatin daily   Hypertension    Hypogonadism male    Nocturia    Numbness    only in 4th and 5th finger on left hand and left leg   Prediabetes 12/26/2018   Reflux esophagitis  08/13/2013   Seasonal allergies    takes Claritin daily as needed and uses Flonase daily as needed   Urinary frequency    takes Flomax daily   Wears hearing aid 10/10/2019     Health Maintenance  Topic Date Due   COVID-19 Vaccine (4 - Booster for Pfizer series) 08/21/2020   TETANUS/TDAP  05/27/2024   COLONOSCOPY  10/08/2028   Pneumonia Vaccine 19+ Years old  Completed   INFLUENZA VACCINE  Completed   Hepatitis C Screening  Completed   Zoster Vaccines- Shingrix  Completed   HPV VACCINES  Aged Out     Immunization History  Administered Date(s) Administered   Influenza  06/18/2015   Influenza, High Dose  109/25/2018, 07/31/2018, 08/18/2020, 07/06/2021   Influenza 08/17/2014, 07/10/2019   PFIZER SARS-COV-2 Vacc 10/18/2019, 11/08/2019, 06/26/2020   PPD Test 05/27/2014   Pneumococcal -13 12/09/2015   Pneumococcal -23 04/08/2013, 06/06/2017   Td 09/13/2003   Tdap 05/27/2014   Zoster Recombinat (Shingrix) 11/29/2017, 02/08/2018    Last Colon - 10/08/2018 - Dr Loletha Carrow  -Diverticulosis - Recc no f/u due to age    Past Surgical History:  Procedure Laterality Date   BACK SURGERY  2016 x 2 L3-L5, 12/2016 S1   lumbar fusion x 3   CERVICAL FUSION  2011/2012   COLONOSCOPY     KNEE ARTHROSCOPY     right 1985; left 1992   PROSTATE SURGERY  10/07/2019   Rezium water vapor procedure by Dr. Gloriann Loan   SEPTOPLASTY     SHOULDER ARTHROSCOPY Left 2015   SHOULDER ARTHROSCOPY WITH ROTATOR CUFF REPAIR Right 08/18/2017   Dr. Onnie Graham   SHOULDER SURGERY Right 08   SPINAL CORD STIMULATOR IMPLANT  08/21/2020   Dr. Blair Hailey     Family  History  Problem Relation Age of Onset   Leukemia Mother    Diabetes Mother    Hypertension Father    Diabetes Father    Colon cancer Neg Hx    Stomach cancer Neg Hx    Esophageal cancer Neg Hx    Rectal cancer Neg Hx      Social History   Tobacco Use   Smoking status: Former    Types: Cigarettes    Quit date: 05/28/1995    Years since quitting:  26.2   Smokeless tobacco: Never   Tobacco comments:    quit smoking >20+yrs ago  Vaping Use   Vaping Use: Never used  Substance Use Topics   Alcohol use: Not Currently    Alcohol/week: 0.0 standard drinks    Comment: rarely   Drug use: No      ROS Constitutional: Denies fever, chills, weight loss/gain, headaches, insomnia,  night sweats or change in appetite. Does c/o fatigue. Eyes: Denies redness, blurred vision, diplopia, discharge, itchy or watery eyes.  ENT: Denies discharge, congestion, post nasal drip, epistaxis, sore throat, earache, hearing loss, dental pain, Tinnitus, Vertigo, Sinus pain or snoring.  Cardio: Denies chest pain, palpitations, irregular heartbeat, syncope, dyspnea, diaphoresis, orthopnea, PND, claudication or edema Respiratory: denies cough, dyspnea, DOE, pleurisy, hoarseness, laryngitis or wheezing.  Gastrointestinal: Denies dysphagia, heartburn, reflux, water brash, pain, cramps, nausea, vomiting, bloating, diarrhea, constipation, hematemesis, melena, hematochezia, jaundice or hemorrhoids Genitourinary: Denies dysuria, frequency, urgency, nocturia, hesitancy, discharge, hematuria or flank pain Musculoskeletal: Denies arthralgia, myalgia, stiffness, Jt. Swelling, pain, limp or strain/sprain. Denies Falls. Skin: Denies puritis, rash, hives, warts, acne, eczema or change in skin lesion Neuro: No weakness, tremor, incoordination, spasms, paresthesia or pain Psychiatric: Denies confusion, memory loss or sensory loss. Denies Depression. Endocrine: Denies change in weight, skin, hair change, nocturia, and paresthesia, diabetic polys, visual blurring or hyper / hypo glycemic episodes.  Heme/Lymph: No excessive bleeding, bruising or enlarged lymph nodes.   Physical Exam  BP 118/80   Pulse 62   Temp 97.9 F (36.6 C)   Resp 16   Ht '6\' 2"'$  (1.88 m)   Wt 194 lb 12.8 oz (88.4 kg)   SpO2 99%   BMI 25.01 kg/m   General Appearance: Well nourished and well groomed and  in no apparent distress.  Eyes: PERRLA, EOMs, conjunctiva no swelling or erythema, normal fundi and vessels. Sinuses: No frontal/maxillary tenderness ENT/Mouth: EACs patent / TMs  nl. Nares clear without erythema, swelling, mucoid exudates. Oral hygiene is good. No erythema, swelling, or exudate. Tongue normal, non-obstructing. Tonsils not swollen or erythematous. Hearing normal.  Neck: Supple, thyroid not palpable. No bruits, nodes or JVD. Respiratory: Respiratory effort normal.  BS equal and clear bilateral without rales, rhonci, wheezing or stridor. Cardio: Heart sounds are normal with regular rate and rhythm and no murmurs, rubs or gallops. Peripheral pulses are normal and equal bilaterally without edema. No aortic or femoral bruits. Chest: symmetric with normal excursions and percussion.  Abdomen: Soft, with Nl bowel sounds. Nontender, no guarding, rebound, hernias, masses, or organomegaly.  Lymphatics: Non tender without lymphadenopathy.  Musculoskeletal: Full ROM all peripheral extremities, joint stability, 5/5 strength, and normal gait. Skin: Warm and dry without rashes, lesions, cyanosis, clubbing or  ecchymosis.  Neuro: Cranial nerves intact, reflexes equal bilaterally. Normal muscle tone, no cerebellar symptoms. Sensation intact.  Pysch: Alert and oriented X 3 with normal affect, insight and judgment appropriate.   Assessment and Plan  1. Annual Preventative/Screening Exam   2. Essential  hypertension  - EKG 12-Lead - Urinalysis, Routine w reflex microscopic - Microalbumin / creatinine urine ratio - CBC with Differential/Platelet - COMPLETE METABOLIC PANEL WITH GFR - Magnesium - TSH  3. Hyperlipidemia, mixed  - EKG 12-Lead - Lipid panel - TSH  4. Abnormal glucose  - EKG 12-Lead - Hemoglobin A1c - Insulin, random  5. Vitamin D deficiency  - VITAMIN D 25 Hydroxy   6. Aortic atherosclerosis ([place]) by Abd CT scan in 05/2015  - EKG 12-Lead - Lipid panel  7.  Gastroesophageal reflux disease without esophagitis  - CBC with Differential/Platelet  8. Testosterone deficiency  - Testosterone  9. Chronic pain syndrome   10. Screening for colorectal cancer  - POC Hemoccult Bld/Stl   11. BPH with obstruction/lower urinary tract symptoms   12. Screening for ischemic heart disease  - EKG 12-Lead  13. Family history of hypertension  - EKG 12-Lead  14. Former smoker  - EKG 12-Lead  15. Medication management  - Urinalysis, Routine w reflex microscopic - Microalbumin / creatinine urine ratio - Testosterone - CBC with Differential/Platelet - COMPLETE METABOLIC PANEL WITH GFR - Magnesium - Lipid panel - TSH - Hemoglobin A1c - Insulin, random - VITAMIN D 25 Hydroxy           Patient was counseled in prudent diet, weight control to achieve/maintain BMI less than 25, BP monitoring, regular exercise and medications as discussed.  Discussed med effects and SE's. Routine screening labs and tests as requested with regular follow-up as recommended. Over 40 minutes of exam, counseling, chart review and high complex critical decision making was performed   Kirtland Bouchard, MD

## 2022-08-29 NOTE — Patient Instructions (Signed)

## 2022-08-30 ENCOUNTER — Ambulatory Visit (INDEPENDENT_AMBULATORY_CARE_PROVIDER_SITE_OTHER): Payer: Medicare Other | Admitting: Internal Medicine

## 2022-08-30 ENCOUNTER — Encounter: Payer: Self-pay | Admitting: Internal Medicine

## 2022-08-30 VITALS — BP 118/80 | HR 62 | Temp 97.9°F | Resp 16 | Ht 74.0 in | Wt 194.8 lb

## 2022-08-30 DIAGNOSIS — Z1211 Encounter for screening for malignant neoplasm of colon: Secondary | ICD-10-CM

## 2022-08-30 DIAGNOSIS — G894 Chronic pain syndrome: Secondary | ICD-10-CM

## 2022-08-30 DIAGNOSIS — Z0000 Encounter for general adult medical examination without abnormal findings: Secondary | ICD-10-CM | POA: Diagnosis not present

## 2022-08-30 DIAGNOSIS — N401 Enlarged prostate with lower urinary tract symptoms: Secondary | ICD-10-CM

## 2022-08-30 DIAGNOSIS — Z125 Encounter for screening for malignant neoplasm of prostate: Secondary | ICD-10-CM

## 2022-08-30 DIAGNOSIS — E559 Vitamin D deficiency, unspecified: Secondary | ICD-10-CM

## 2022-08-30 DIAGNOSIS — E782 Mixed hyperlipidemia: Secondary | ICD-10-CM

## 2022-08-30 DIAGNOSIS — Z136 Encounter for screening for cardiovascular disorders: Secondary | ICD-10-CM

## 2022-08-30 DIAGNOSIS — K219 Gastro-esophageal reflux disease without esophagitis: Secondary | ICD-10-CM

## 2022-08-30 DIAGNOSIS — I10 Essential (primary) hypertension: Secondary | ICD-10-CM

## 2022-08-30 DIAGNOSIS — R7309 Other abnormal glucose: Secondary | ICD-10-CM

## 2022-08-30 DIAGNOSIS — Z0001 Encounter for general adult medical examination with abnormal findings: Secondary | ICD-10-CM

## 2022-08-30 DIAGNOSIS — Z8249 Family history of ischemic heart disease and other diseases of the circulatory system: Secondary | ICD-10-CM

## 2022-08-30 DIAGNOSIS — E349 Endocrine disorder, unspecified: Secondary | ICD-10-CM

## 2022-08-30 DIAGNOSIS — Z87891 Personal history of nicotine dependence: Secondary | ICD-10-CM

## 2022-08-30 DIAGNOSIS — R609 Edema, unspecified: Secondary | ICD-10-CM

## 2022-08-30 DIAGNOSIS — I700 Atherosclerosis of aorta: Secondary | ICD-10-CM

## 2022-08-30 DIAGNOSIS — Z79899 Other long term (current) drug therapy: Secondary | ICD-10-CM

## 2022-08-30 MED ORDER — FUROSEMIDE 40 MG PO TABS: ORAL_TABLET | ORAL | 3 refills | Status: DC

## 2022-08-30 MED ORDER — NEEDLES & SYRINGES MISC: 3 refills | Status: DC

## 2022-08-31 LAB — URINALYSIS, ROUTINE W REFLEX MICROSCOPIC
Bilirubin Urine: NEGATIVE
Glucose, UA: NEGATIVE
Hgb urine dipstick: NEGATIVE
Ketones, ur: NEGATIVE
Leukocytes,Ua: NEGATIVE
Nitrite: NEGATIVE
Protein, ur: NEGATIVE
Specific Gravity, Urine: 1.022 (ref 1.001–1.035)
pH: 7.5 (ref 5.0–8.0)

## 2022-08-31 LAB — CBC WITH DIFFERENTIAL/PLATELET
Absolute Monocytes: 404 cells/uL (ref 200–950)
Basophils Absolute: 20 cells/uL (ref 0–200)
Basophils Relative: 0.5 %
Eosinophils Absolute: 152 cells/uL (ref 15–500)
Eosinophils Relative: 3.8 %
HCT: 40.8 % (ref 38.5–50.0)
Hemoglobin: 14.2 g/dL (ref 13.2–17.1)
Lymphs Abs: 1208 cells/uL (ref 850–3900)
MCH: 33.3 pg — ABNORMAL HIGH (ref 27.0–33.0)
MCHC: 34.8 g/dL (ref 32.0–36.0)
MCV: 95.8 fL (ref 80.0–100.0)
MPV: 10.7 fL (ref 7.5–12.5)
Monocytes Relative: 10.1 %
Neutro Abs: 2216 cells/uL (ref 1500–7800)
Neutrophils Relative %: 55.4 %
Platelets: 259 10*3/uL (ref 140–400)
RBC: 4.26 10*6/uL (ref 4.20–5.80)
RDW: 12.7 % (ref 11.0–15.0)
Total Lymphocyte: 30.2 %
WBC: 4 10*3/uL (ref 3.8–10.8)

## 2022-08-31 LAB — COMPLETE METABOLIC PANEL WITH GFR
AG Ratio: 1.2 (calc) (ref 1.0–2.5)
ALT: 21 U/L (ref 9–46)
AST: 18 U/L (ref 10–35)
Albumin: 4.1 g/dL (ref 3.6–5.1)
Alkaline phosphatase (APISO): 86 U/L (ref 35–144)
BUN: 22 mg/dL (ref 7–25)
CO2: 28 mmol/L (ref 20–32)
Calcium: 10.1 mg/dL (ref 8.6–10.3)
Chloride: 104 mmol/L (ref 98–110)
Creat: 1.18 mg/dL (ref 0.70–1.28)
Globulin: 3.3 g/dL (calc) (ref 1.9–3.7)
Glucose, Bld: 84 mg/dL (ref 65–99)
Potassium: 4.7 mmol/L (ref 3.5–5.3)
Sodium: 138 mmol/L (ref 135–146)
Total Bilirubin: 0.4 mg/dL (ref 0.2–1.2)
Total Protein: 7.4 g/dL (ref 6.1–8.1)
eGFR: 66 mL/min/{1.73_m2} (ref >=60)

## 2022-08-31 LAB — TSH: TSH: 3.62 mIU/L (ref 0.40–4.50)

## 2022-08-31 LAB — LIPID PANEL
Cholesterol: 179 mg/dL (ref <200)
HDL: 43 mg/dL (ref >=40)
LDL Cholesterol (Calc): 108 mg/dL (calc) — ABNORMAL HIGH
Non-HDL Cholesterol (Calc): 136 mg/dL (calc) — ABNORMAL HIGH (ref <130)
Total CHOL/HDL Ratio: 4.2 (calc) (ref <5.0)
Triglycerides: 161 mg/dL — ABNORMAL HIGH (ref <150)

## 2022-08-31 LAB — MICROALBUMIN / CREATININE URINE RATIO
Creatinine, Urine: 162 mg/dL (ref 20–320)
Microalb Creat Ratio: 2 mcg/mg creat (ref <30)
Microalb, Ur: 0.4 mg/dL

## 2022-08-31 LAB — INSULIN, RANDOM: Insulin: 9.7 u[IU]/mL

## 2022-08-31 LAB — HEMOGLOBIN A1C
Hgb A1c MFr Bld: 5.9 % of total Hgb — ABNORMAL HIGH (ref <5.7)
Mean Plasma Glucose: 123 mg/dL
eAG (mmol/L): 6.8 mmol/L

## 2022-08-31 LAB — PSA: PSA: 1.3 ng/mL (ref <=4.00)

## 2022-08-31 LAB — VITAMIN D 25 HYDROXY (VIT D DEFICIENCY, FRACTURES): Vit D, 25-Hydroxy: 53 ng/mL (ref 30–100)

## 2022-08-31 LAB — TESTOSTERONE: Testosterone: 275 ng/dL (ref 250–827)

## 2022-08-31 LAB — MAGNESIUM: Magnesium: 2.4 mg/dL (ref 1.5–2.5)

## 2022-08-31 NOTE — Progress Notes (Signed)
<><><><><><><><><><><><><><><><><><><><><><><><><><><><><><><><><> <><><><><><><><><><><><><><><><><><><><><><><><><><><><><><><><><> - Test results slightly outside the reference range are not unusual. If there is anything important, I will review this with you,  otherwise it is considered normal test values.  If you have further questions,  please do not hesitate to contact me at the office or via My Chart.  <><><><><><><><><><><><><><><><><><><><><><><><><><><><><><><><><> <><><><><><><><><><><><><><><><><><><><><><><><><><><><><><><><><>  -  A1c - Average blood sugar  is slightly elevated ,  - So - Avoid Sweets, Candy & White Stuff   - White Rice, White Potatoes, White Flour  - Breads &  Pasta <><><><><><><><><><><><><><><><><><><><><><><><><><><><><><><><><> <><><><><><><><><><><><><><><><><><><><><><><><><><><><><><><><><>  -  Total Chol = 179   -  very good   - Very low risk for Heart Attack  / Stroke <><><><><><><><><><><><><><><><><><><><><><><><><><><><><><><><><> <><><><><><><><><><><><><><><><><><><><><><><><><><><><><><><><><>  -  But The bad  / dangerous LDL chol  = 108  is too high                              (  Ideal or Goal is less than 70  !  )   - The Bad LDL Cholesterol is too high - Recommend  a stricter low cholesterol diet   - Cholesterol only comes from animal sources                                                                                                                                                                                                                                                                                                                                                                                                                                                                                                                                                                                       -  ie. meat, dairy, egg yolks  - Eat  all the vegetables you want.  - Avoid Meat, Avoid Meat,  Avoid Meat - especially Red Meat - Beef AND Pork .  - Avoid cheese & dairy - milk & ice cream.     - Cheese is the most concentrated form of trans-fats which  is the worst thing to clog up our arteries.   - Veggie cheese is OK which can be found in the fresh  produce section at [employer] or [employer] or [employer] <><><><><><><><><><><><><><><><><><><><><><><><><><><><><><><><><> <><><><><><><><><><><><><><><><><><><><><><><><><><><><><><><><><>  -   PSA- Low  No Prostate Cancer - Great -! <><><><><><><><><><><><><><><><><><><><><><><><><><><><><><><><><> <><><><><><><><><><><><><><><><><><><><><><><><><><><><><><><><><>  -  Testosterone - Normal & OHK  <><><><><><><><><><><><><><><><><><><><><><><><><><><><><><><><><> <><><><><><><><><><><><><><><><><><><><><><><><><><><><><><><><><>  -  Vitamin D = 53 - is a little low  - Vitamin D goal is between 70-100.   - Please INCREASE your Vitamin D 5,000 unit capsule                                                                             to take 2 capsules = 10,000 units Daily  - It is very important as a natural anti-inflammatory and helping the                                       immune system protect against viral infections, like the Covid-19    helping hair, skin, and nails, as well as reducing stroke and  heart attack risk.   - It helps your bones and helps with mood.  - It also decreases numerous cancer risks so please                                                                                               take it as directed.   - Low Vit D is associated with a 200-300% higher risk for CANCER   and 200-300% higher risk for HEART   ATTACK  &  STROKE.    - It is also associated with higher death rate at younger ages,   autoimmune diseases like Rheumatoid arthritis, Lupus, Multiple Sclerosis.     - Also many other serious conditions, like depression,  Alzheimer's  Dementia, infertility, muscle aches, fatigue, fibromyalgia  <><><><><><><><><><><><><><><><><><><><><><><><><><><><><><><><><> <><><><><><><><><><><><><><><><><><><><><><><><><><><><><><><><><>  -  All Else - CBC - Kidneys - Electrolytes - Liver - Magnesium & Thyroid    - all  Normal / OK <><><><><><><><><><><><><><><><><><><><><><><><><><><><><><><><><> <><><><><><><><><><><><><><><><><><><><><><><><><><><><><><><><><>

## 2022-09-07 ENCOUNTER — Encounter: Payer: Medicare Other | Admitting: Internal Medicine

## 2022-09-27 ENCOUNTER — Other Ambulatory Visit: Payer: Self-pay

## 2022-09-27 DIAGNOSIS — K219 Gastro-esophageal reflux disease without esophagitis: Secondary | ICD-10-CM

## 2022-09-27 DIAGNOSIS — Z1211 Encounter for screening for malignant neoplasm of colon: Secondary | ICD-10-CM

## 2022-09-27 LAB — POC HEMOCCULT BLD/STL (HOME/3-CARD/SCREEN)
Card #2 Fecal Occult Blod, POC: NEGATIVE
Card #3 Fecal Occult Blood, POC: NEGATIVE
Fecal Occult Blood, POC: NEGATIVE

## 2022-09-28 DIAGNOSIS — Z1211 Encounter for screening for malignant neoplasm of colon: Secondary | ICD-10-CM

## 2022-09-28 DIAGNOSIS — Z1212 Encounter for screening for malignant neoplasm of rectum: Secondary | ICD-10-CM | POA: Diagnosis not present

## 2022-10-14 ENCOUNTER — Other Ambulatory Visit: Payer: Self-pay | Admitting: Internal Medicine

## 2022-10-15 ENCOUNTER — Other Ambulatory Visit: Payer: Self-pay | Admitting: Nurse Practitioner

## 2022-10-15 DIAGNOSIS — R609 Edema, unspecified: Secondary | ICD-10-CM

## 2022-10-15 DIAGNOSIS — I10 Essential (primary) hypertension: Secondary | ICD-10-CM

## 2022-10-19 ENCOUNTER — Other Ambulatory Visit: Payer: Self-pay

## 2022-10-19 MED ORDER — OMEPRAZOLE 20 MG PO CPDR: DELAYED_RELEASE_CAPSULE | ORAL | 3 refills | Status: DC

## 2022-11-15 ENCOUNTER — Ambulatory Visit: Payer: Medicare Other | Admitting: Nurse Practitioner

## 2022-12-06 NOTE — Progress Notes (Deleted)
Patient ID: Jacob Larsen, male   DOB: 04-Aug-1951, 72 y.o.   MRN: [id number]  MEDICARE ANNUAL WELLNESS VISIT AND FOLLOW UP Assessment:   Encounter for annual medicare wellness visit  Atherosclerosis of aorta Per CT 05/2015 Control blood pressure, cholesterol, glucose, increase exercise.   Essential hypertension - continue medications, DASH diet, exercise and monitor at home. Call if greater than 130/80.  -     CBC with Differential/Platelet -     CMP/GFR -     TSH  Chronic obstructive pulmonary disease, unspecified COPD type ([place]) Avoid triggers, monitor. Per CXR 2018.   Reflux esophagitis/GERD Continue PPI/H2 blocker, diet discussed  Lumbar degenerative disc disease Continue follow up with pain clinic  Mixed hyperlipidemia -     Lipid panel -continue medications, check lipids, decrease fatty foods, increase activity.   Abnormal glucose Recent A1Cs at goal Discussed diet/exercise, weight management  Defer A1C; check CMP  Abnormal Thyroid Test Recheck TSH today  Seasonal Allergic rhinitis Advised to take Fexofenadine and Flonase regularly for the next couple of weeks due to high pollen count.  Monitor symptoms  Tendonitis left foot Continue to follow with podiatry  Medication management Continued  Vitamin D deficiency Continue supplement  Testosterone deficiency - continue replacement therapy, check testosterone levels as needed.   S/P lumbar spinal fusion Continue follow up  Diverticulosis of large intestine without hemorrhage Increase fiber, no symptoms at this time UTD colonoscopy next due 09/2028  Impotence of organic origin Continue follow up, current medications via urology   BPH with nocturia Off of meds s/p vapor procedure; managed by [facility]  Spondylolisthesis of lumbar region Continue follow up  BMI 26 Continue to recommend diet heavy in fruits and veggies and low in animal meats, cheeses, and dairy products, appropriate calorie  intake Discuss exercise recommendations routinely Continue to monitor weight at each visit  Constipation Stop Miralax as it is making BM too liquid Start Colace or Senokot and increase fiber intake   Over 30 minutes of exam, counseling, chart review, and critical decision making was performed Future Appointments  Date Time Provider [place]  12/07/2022 11:00 AM Alycia Rossetti, NP [facility] None  03/21/2023 11:30 AM Unk Pinto, MD [facility] None  09/04/2023 10:00 AM Unk Pinto, MD [facility] None  12/07/2023 11:00 AM Alycia Rossetti, NP [facility] None    Plan:   During the course of the visit the patient was educated and counseled about appropriate screening and preventive services including:   Pneumococcal vaccine  Influenza vaccine Prevnar 13 Td vaccine Screening electrocardiogram Colorectal cancer screening Diabetes screening Glaucoma screening Nutrition counseling    Subjective:  Jacob Larsen is a 72 y.o. AA male who presents for Medicare Annual Wellness Visit and 3 month follow up for HTN, hyperlipidemia, prediabetes, and vitamin D Def.   He is using Miralax and is having some stomach cramping and liquid BM.   He did have recent ABI which was normal. He has not been using Furosemide and swelling in feet have been controlled with Compression socks and elevation of legs.   He is having more post nasal drip.  Continues on Fexofenadine and Flonase.  He is managed by Dr. Mallie Mussel Pool/Dr. Luan Pulling for chronic lumbar pain, s/p cervical fusion in 2011 and 2012, and lumbar fusion x 3 in 2016 x 2 and in 2018 - S1 - L3, and is prescribed pain medication through that clinic. Recently on gabapentin 300 mg TID PRN and oxy 5 mg IR QID PRN (  recently estimates 2 tabs/day), had spinal stimulator implanted 08/21/2020 by Dr. Blair Hailey and has noted improvement.   Remote former smoker. He has COPD by imaging, asymptomatic off of inhalers.   He was recently seen  by Dr. Valentina Lucks for tendonitis of foot and was injected with cortisone. He continues to have some pain on bottom of left foot.  He does also get some numbness and tingling in feet bilaterally.  Continues to wear compression socks and uses Voltaren. Is no longer taking Gabapentin - did not feel like it helped.  BMI is There is no height or weight on file to calculate BMI., he has been working on diet, admits no intentional exercise but generally active around the house for 50% of the day, when back allows. Can walk short distances, takes dog out. Will rice a bicycle occasionally.  Wt Readings from Last 3 Encounters:  08/30/22 194 lb 12.8 oz (88.4 kg)  03/03/22 202 lb 12.8 oz (92 kg)  11/29/21 196 lb (88.9 kg)   He has aortic atherosclerosis per CT 05/2015.  His blood pressure has been controlled at home, today their BP is    BP Readings from Last 3 Encounters:  08/30/22 118/80  03/03/22 112/70  11/29/21 100/60     He does not workout. He denies chest pain, shortness of breath, dizziness.    He is on cholesterol medication (atorvastatin 40 mg three days a week) and denies myalgias. His cholesterol is at goal. The cholesterol last visit was:   Lab Results  Component Value Date   CHOL 179 08/30/2022   HDL 43 08/30/2022   LDLCALC 108 (H) 08/30/2022   TRIG 161 (H) 08/30/2022   CHOLHDL 4.2 08/30/2022   He has been working on diet and exercise for glucose management (hx of prediabetes A1C 6.0% in 2015, 5.7% in 04/2020), and denies foot ulcerations, hyperglycemia, hypoglycemia , increased appetite, nausea, polydipsia, polyuria, visual disturbances, vomiting and weight loss. Last A1C in the office was:  Lab Results  Component Value Date   HGBA1C 5.9 (H) 08/30/2022   Last GFR Lab Results  Component Value Date   GFRAA 66 02/16/2021   Patient is on Vitamin D supplement.   Lab Results  Component Value Date   VD25OH 36 08/30/2022     He has a history of testosterone deficiency and is on  testosterone replacement, last shot was 4 days ago, he injects 400 mg at home q 20 days.  He states that the testosterone helps with his energy, libido, muscle mass. He has ED and prescribed sildenafil 20 mg PRN which does help.  He had rezium water vapor therapy procedure for BPH at [facility] by Dr. Gloriann Loan on 10/07/2019 and dong well since, off of all meds.  Lab Results  Component Value Date   TESTOSTERONE 275 08/30/2022     Medication Review: Current Outpatient Medications on File Prior to Visit  Medication Sig Dispense Refill   atorvastatin (LIPITOR) 40 MG tablet TAKE 1 TABLET 3 DAYS A WEEK FOR CHOLESTEROL. 39 tablet 3   diclofenac (VOLTAREN) 75 MG EC tablet TAKE 1 TABLET 2 X DAY WITH FOOD FOR PAIN & INFLAMMATION. 180 tablet 1   fexofenadine (ALLEGRA) 180 MG tablet TAKE 1 TABLET BY MOUTH EVERY DAY 90 tablet 3   Flaxseed, Linseed, (FLAX SEED OIL) 1000 MG CAPS Take 1 capsule (1,000 mg total) by mouth 2 (two) times daily.  0   fluticasone (FLONASE) 50 MCG/ACT nasal spray PLACE 1-2 SPRAYS INTO BOTH NOSTRILS DAILY.  48 mL 3   furosemide (LASIX) 40 MG tablet TAKE 1 TABLET TWICE A DAY FOR FLUID RETENTION AND LEG SWELLIN 180 tablet 1   Magnesium 250 MG TABS Take 250 mg by mouth daily.     Needles & Syringes MISC Use for Testosterone injection  every  week 50 each 3   olmesartan (BENICAR) 40 MG tablet Take  1 tablet Daily for BP                                                            /                                      TAKE                                BY                   MOUTH 90 tablet 3   omeprazole (PRILOSEC) 20 MG capsule TAKE 1 CAPSULE BY MOUTH  TWICE DAILY AS NEEDED FOR  INDIGESTION AND ACID REFLUX 180 capsule 3   oxyCODONE (OXY IR/ROXICODONE) 5 MG immediate release tablet Take 5 mg by mouth every 4 (four) hours as needed for severe pain.     sildenafil (VIAGRA) 100 MG tablet Take 1 tablet (100 mg total) by mouth as needed for erectile dysfunction. 30 tablet 1   tamsulosin  (FLOMAX) 0.4 MG CAPS capsule Take 0.4 mg by mouth daily.     testosterone cypionate (DEPOTESTOSTERONE CYPIONATE) 200 MG/ML injection Inject 1 ml into Muscle every 7 days 13 mL 1   vitamin B-12 (CYANOCOBALAMIN) 1000 MCG tablet Take 1,000 mcg by mouth daily.     VITAMIN D PO Take 5,000 Units by mouth daily.     No current facility-administered medications on file prior to visit.    Current Problems (verified) Patient Active Problem List   Diagnosis Date Noted   Seasonal allergic rhinitis 04/22/2021   Spinal cord stimulator in place 08/21/2020 11/10/2020   Chronic pain syndrome 07/08/2020   Opioid dependence ([place]) 05/11/2020   Benign prostatic hyperplasia with nocturia 10/10/2019   Aortic atherosclerosis ([place]) by Abd CT scan in 05/2015 10/09/2019   Cervical fusion syndrome 05/23/2019   Lumbar post-laminectomy syndrome 05/23/2019   Gastroesophageal reflux disease without esophagitis 12/26/2018   Overweight (BMI 25.0-29.9) 01/23/2018   COPD (chronic obstructive pulmonary disease) ([place]) 04/03/2017   S/P lumbar spinal fusion 12/27/2016   Medication management 09/09/2015   Spondylolisthesis of lumbar region 04/28/2015   Testosterone deficiency 11/06/2014   Lumbar degenerative disc disease 09/23/2014   Essential hypertension 08/13/2013   Hyperlipidemia, mixed 08/13/2013   Abnormal glucose (hx of prediabetes) 08/13/2013   Vitamin D deficiency 08/13/2013   Impotence of organic origin 08/13/2013   Diverticulosis of large intestine 04/01/2008    Screening Tests Immunization History  Administered Date(s) Administered   Influenza Split 06/18/2015   Influenza, High Dose Seasonal PF 06/20/2016, 06/06/2017, 07/31/2018, 08/18/2020, 07/06/2021   Influenza-Unspecified 08/17/2014, 07/10/2019   PFIZER(Purple Top)SARS-COV-2 Vaccination 10/18/2019, 11/08/2019, 06/26/2020   PPD Test 05/27/2014   Pfizer Covid-19 Vaccine Bivalent Booster 54yrs & up 08/04/2021   Pneumococcal  Conjugate-13 12/09/2015    Pneumococcal Polysaccharide-23 04/08/2013, 06/06/2017   Td 09/13/2003   Tdap 05/27/2014   Zoster Recombinat (Shingrix) 11/29/2017, 02/08/2018    Preventative care: Last colonoscopy: 09/2018, due 10 years 2030  Prior vaccinations: TD or Tdap: 2015  Influenza: 08/2020 Pneumococcal: 2014, 2018 Prevnar13: 2017 Shingrix: 2/2, 2019 Covid 19: 3/3, 2021, pfizer - booster information requested  Names of Other Physician/Practitioners you currently use: 1. [facility] here for primary care 2. Dr. Sabra Heck, eye doctor, last visit June 2021 will schedule appt 3. Dr. Rona Ravens, dentist, last visit 05/2021, goes q85m  Patient Care Team: Unk Pinto, MD as PCP - General (Internal Medicine) Inda Castle, MD (Inactive) as Consulting Physician (Gastroenterology) Kathie Rhodes, MD (Inactive) as Consulting Physician (Urology) Barbaraann Cao, OD as Referring Physician (Optometry) Druscilla Brownie, MD as Consulting Physician (Dermatology) Earnie Larsson, MD as Consulting Physician (Neurosurgery)  Allergies Allergies  Allergen Reactions   Other Hives    Beef-hives  RED MEAT   Aspirin Hives and Rash   Salicylates Rash    SURGICAL HISTORY He  has a past surgical history that includes Cervical fusion (2011/2012); Knee arthroscopy; Septoplasty; Shoulder arthroscopy (Left, 2015); Shoulder surgery (Right, 08); Colonoscopy; Back surgery (2016 x 2 L3-L5, 12/2016 S1); Shoulder arthroscopy with rotator cuff repair (Right, 08/18/2017); Spinal cord stimulator implant (08/21/2020); and Prostate surgery (10/07/2019). FAMILY HISTORY His family history includes Diabetes in his father and mother; Hypertension in his father; Leukemia in his mother. SOCIAL HISTORY He  reports that he quit smoking about 27 years ago. His smoking use included cigarettes. He has never used smokeless tobacco. He reports that he does not currently use alcohol. He reports that he does not use  drugs.  MEDICARE WELLNESS OBJECTIVES: Physical activity:   Cardiac risk factors:   Depression/mood screen:      08/29/2022    8:19 PM  Depression screen PHQ 2/9  Decreased Interest 0  Down, Depressed, Hopeless 0  PHQ - 2 Score 0    ADLs:     08/29/2022    8:19 PM 03/02/2022   10:29 PM  In your present state of health, do you have any difficulty performing the following activities:  Hearing?  0  Vision? 0 0  Difficulty concentrating or making decisions? 0 0  Walking or climbing stairs? 0 0  Dressing or bathing? 0 0  Doing errands, shopping? 0 0     Cognitive Testing  Alert? Yes  Normal Appearance?Yes  Oriented to person? Yes  Place? Yes   Time? Yes  Recall of three objects?  3/3  Can perform simple calculations? Yes  Displays appropriate judgment?Yes  Can read the correct time from a watch face?Yes  EOL planning:     Objective:   There were no vitals filed for this visit.   There is no height or weight on file to calculate BMI.  General appearance: alert, no distress, WD/WN, male HEENT: normocephalic, sclerae anicteric, TMs pearly, nares patent, no discharge or erythema, pharynx normal Oral cavity: MMM, no lesions Neck: supple, no lymphadenopathy, no thyromegaly, no masses Heart: RRR, normal S1, S2, no murmurs. No edema Lungs: CTA bilaterally, no wheezes, rhonchi, or rales Abdomen: +bs, soft, non tender, non distended, no masses, no hepatomegaly, no splenomegaly Musculoskeletal: nontender, no swelling, no obvious deformity. Unable to reproduce left foot pain with palpation Extremities: no edema, no cyanosis, no clubbing Pulses: 2+ symmetric, upper and lower extremities, normal cap refill Neurological: alert, oriented x 3, CN2-12 intact,  strength normal upper extremities and lower extremities, sensation normal throughout, DTRs 2+ throughout, no cerebellar signs, gait normal Psychiatric: normal affect, behavior normal, pleasant   Medicare Attestation I have  personally reviewed: The patient's medical and social history Their use of alcohol, tobacco or illicit drugs Their current medications and supplements The patient's functional ability including ADLs,fall risks, home safety risks, cognitive, and hearing and visual impairment Diet and physical activities Evidence for depression or mood disorders  The patient's weight, height, BMI, and visual acuity have been recorded in the chart.  I have made referrals, counseling, and provided education to the patient based on review of the above and I have provided the patient with a written personalized care plan for preventive services.     Alycia Rossetti, NP   12/06/2022

## 2022-12-07 ENCOUNTER — Ambulatory Visit: Payer: Medicare Other | Admitting: Nurse Practitioner

## 2022-12-07 DIAGNOSIS — Z79899 Other long term (current) drug therapy: Secondary | ICD-10-CM

## 2022-12-07 DIAGNOSIS — E559 Vitamin D deficiency, unspecified: Secondary | ICD-10-CM

## 2022-12-07 DIAGNOSIS — Z0000 Encounter for general adult medical examination without abnormal findings: Secondary | ICD-10-CM

## 2022-12-07 DIAGNOSIS — E782 Mixed hyperlipidemia: Secondary | ICD-10-CM

## 2022-12-07 DIAGNOSIS — J302 Other seasonal allergic rhinitis: Secondary | ICD-10-CM

## 2022-12-07 DIAGNOSIS — J449 Chronic obstructive pulmonary disease, unspecified: Secondary | ICD-10-CM

## 2022-12-07 DIAGNOSIS — M4316 Spondylolisthesis, lumbar region: Secondary | ICD-10-CM

## 2022-12-07 DIAGNOSIS — K219 Gastro-esophageal reflux disease without esophagitis: Secondary | ICD-10-CM

## 2022-12-07 DIAGNOSIS — N529 Male erectile dysfunction, unspecified: Secondary | ICD-10-CM

## 2022-12-07 DIAGNOSIS — M7750 Other enthesopathy of unspecified foot: Secondary | ICD-10-CM

## 2022-12-07 DIAGNOSIS — R7989 Other specified abnormal findings of blood chemistry: Secondary | ICD-10-CM

## 2022-12-07 DIAGNOSIS — I700 Atherosclerosis of aorta: Secondary | ICD-10-CM

## 2022-12-07 DIAGNOSIS — E663 Overweight: Secondary | ICD-10-CM

## 2022-12-07 DIAGNOSIS — K5730 Diverticulosis of large intestine without perforation or abscess without bleeding: Secondary | ICD-10-CM

## 2022-12-07 DIAGNOSIS — N138 Other obstructive and reflux uropathy: Secondary | ICD-10-CM

## 2022-12-07 DIAGNOSIS — E349 Endocrine disorder, unspecified: Secondary | ICD-10-CM

## 2022-12-07 DIAGNOSIS — R7309 Other abnormal glucose: Secondary | ICD-10-CM

## 2022-12-07 DIAGNOSIS — I10 Essential (primary) hypertension: Secondary | ICD-10-CM

## 2022-12-07 DIAGNOSIS — Z981 Arthrodesis status: Secondary | ICD-10-CM

## 2022-12-07 DIAGNOSIS — K5909 Other constipation: Secondary | ICD-10-CM

## 2022-12-12 NOTE — Progress Notes (Signed)
Patient ID: Jacob Larsen, male   DOB: March 22, 1951, 72 y.o.   MRN: [id number]  MEDICARE ANNUAL WELLNESS VISIT AND FOLLOW UP Assessment:   Encounter for annual medicare wellness visit  Atherosclerosis of aorta Per CT 05/2015 Control blood pressure, cholesterol, glucose, increase exercise.   Essential hypertension - continue medications, DASH diet, exercise and monitor at home. Call if greater than 130/80.  -     CBC with Differential/Platelet -     CMP/GFR  Chronic obstructive pulmonary disease, unspecified COPD type ([place]) Avoid triggers, monitor. Per CXR 2018.   Reflux esophagitis/GERD Continue PPI/H2 blocker, diet discussed  Lumbar degenerative disc disease Continue follow up with pain clinic  Mixed hyperlipidemia -     Lipid panel -continue medications, check lipids, decrease fatty foods, increase activity.   Abnormal glucose Recent A1Cs at goal Discussed diet/exercise, weight management  Defer A1C; check CMP   Seasonal Allergic rhinitis Advised to take Fexofenadine and Flonase regularly for the next couple of weeks due to high pollen count.  Monitor symptoms  Tendonitis left foot Continue to follow with podiatry  Medication management Magnesium  Vitamin D deficiency Continue supplement  Testosterone deficiency - continue replacement therapy, check testosterone levels as needed.   S/P lumbar spinal fusion Continue follow up  Diverticulosis of large intestine without hemorrhage Increase fiber, no symptoms at this time UTD colonoscopy next due 09/2028  Impotence of organic origin Continue follow up, current medications via urology   BPH with nocturia Off of meds s/p vapor procedure; managed by [facility]  Spondylolisthesis of lumbar region Continue follow up  BMI 26 Continue to recommend diet heavy in fruits and veggies and low in animal meats, cheeses, and dairy products, appropriate calorie intake Discuss exercise recommendations  routinely Continue to monitor weight at each visit  Constipation Stop Miralax as it is making BM too liquid Start Colace or Senokot and increase fiber intake   Over 30 minutes of exam, counseling, chart review, and critical decision making was performed Future Appointments  Date Time Provider [place]  12/13/2022 11:00 AM Alycia Rossetti, NP [facility] None  03/21/2023 11:30 AM Unk Pinto, MD [facility] None  09/04/2023 10:00 AM Unk Pinto, MD [facility] None  12/07/2023 11:00 AM Alycia Rossetti, NP [facility] None    Plan:   During the course of the visit the patient was educated and counseled about appropriate screening and preventive services including:   Pneumococcal vaccine  Influenza vaccine Prevnar 13 Td vaccine Screening electrocardiogram Colorectal cancer screening Diabetes screening Glaucoma screening Nutrition counseling    Subjective:  Jacob Larsen is a 72 y.o. AA male who presents for Medicare Annual Wellness Visit and 3 month follow up for HTN, hyperlipidemia, prediabetes, and vitamin D Def.   He is using Miralax and is having some stomach cramping and liquid BM.   He did have recent ABI which was normal. He has not been using Furosemide and swelling in feet have been controlled with Compression socks and elevation of legs.   He is having more post nasal drip.  Continues on Fexofenadine and Flonase.  He is managed by Dr. Mallie Mussel Pool/Dr. Luan Pulling for chronic lumbar pain, s/p cervical fusion in 2011 and 2012, and lumbar fusion x 3 in 2016 x 2 and in 2018 - S1 - L3, and is prescribed pain medication through that clinic. Recently on gabapentin 300 mg TID PRN and oxy 5 mg IR QID PRN (recently estimates 2 tabs/day), had spinal stimulator implanted 08/21/2020 by Dr. Shanon Brow  [place] and has noted improvement.   Remote former smoker. He has COPD by imaging, asymptomatic off of inhalers.   He was recently seen by Dr. Valentina Lucks for tendonitis of foot and  was injected with cortisone. He continues to have some pain on bottom of left foot.  He does also get some numbness and tingling in feet bilaterally.  Continues to wear compression socks and uses Voltaren. Is no longer taking Gabapentin - did not feel like it helped.  BMI is There is no height or weight on file to calculate BMI., he has been working on diet, admits no intentional exercise but generally active around the house for 50% of the day, when back allows. Can walk short distances, takes dog out. Will rice a bicycle occasionally.  Wt Readings from Last 3 Encounters:  08/30/22 194 lb 12.8 oz (88.4 kg)  03/03/22 202 lb 12.8 oz (92 kg)  11/29/21 196 lb (88.9 kg)   He has aortic atherosclerosis per CT 05/2015.  His blood pressure has been controlled at home, today their BP is    BP Readings from Last 3 Encounters:  08/30/22 118/80  03/03/22 112/70  11/29/21 100/60     He does not workout. He denies chest pain, shortness of breath, dizziness.    He is on cholesterol medication (atorvastatin 40 mg three days a week) and denies myalgias. His cholesterol is at goal. The cholesterol last visit was:   Lab Results  Component Value Date   CHOL 179 08/30/2022   HDL 43 08/30/2022   LDLCALC 108 (H) 08/30/2022   TRIG 161 (H) 08/30/2022   CHOLHDL 4.2 08/30/2022   He has been working on diet and exercise for glucose management (hx of prediabetes A1C 6.0% in 2015, 5.7% in 04/2020), and denies foot ulcerations, hyperglycemia, hypoglycemia , increased appetite, nausea, polydipsia, polyuria, visual disturbances, vomiting and weight loss. Last A1C in the office was:  Lab Results  Component Value Date   HGBA1C 5.9 (H) 08/30/2022   Last GFR Lab Results  Component Value Date   GFRAA 66 02/16/2021   Patient is on Vitamin D supplement.   Lab Results  Component Value Date   VD25OH 20 08/30/2022     He has a history of testosterone deficiency and is on testosterone replacement, last shot was 4 days  ago, he injects 400 mg at home q 20 days.  He states that the testosterone helps with his energy, libido, muscle mass. He has ED and prescribed sildenafil 20 mg PRN which does help.  He had rezium water vapor therapy procedure for BPH at [facility] by Dr. Gloriann Loan on 10/07/2019 and dong well since, off of all meds.  Lab Results  Component Value Date   TESTOSTERONE 275 08/30/2022     Medication Review: Current Outpatient Medications on File Prior to Visit  Medication Sig Dispense Refill   atorvastatin (LIPITOR) 40 MG tablet TAKE 1 TABLET 3 DAYS A WEEK FOR CHOLESTEROL. 39 tablet 3   diclofenac (VOLTAREN) 75 MG EC tablet TAKE 1 TABLET 2 X DAY WITH FOOD FOR PAIN & INFLAMMATION. 180 tablet 1   fexofenadine (ALLEGRA) 180 MG tablet TAKE 1 TABLET BY MOUTH EVERY DAY 90 tablet 3   Flaxseed, Linseed, (FLAX SEED OIL) 1000 MG CAPS Take 1 capsule (1,000 mg total) by mouth 2 (two) times daily.  0   fluticasone (FLONASE) 50 MCG/ACT nasal spray PLACE 1-2 SPRAYS INTO BOTH NOSTRILS DAILY. 48 mL 3   furosemide (LASIX) 40 MG tablet TAKE 1  TABLET TWICE A DAY FOR FLUID RETENTION AND LEG SWELLIN 180 tablet 1   Magnesium 250 MG TABS Take 250 mg by mouth daily.     Needles & Syringes MISC Use for Testosterone injection  every  week 50 each 3   olmesartan (BENICAR) 40 MG tablet Take  1 tablet Daily for BP                                                            /                                      TAKE                                BY                   MOUTH 90 tablet 3   omeprazole (PRILOSEC) 20 MG capsule TAKE 1 CAPSULE BY MOUTH  TWICE DAILY AS NEEDED FOR  INDIGESTION AND ACID REFLUX 180 capsule 3   oxyCODONE (OXY IR/ROXICODONE) 5 MG immediate release tablet Take 5 mg by mouth every 4 (four) hours as needed for severe pain.     sildenafil (VIAGRA) 100 MG tablet Take 1 tablet (100 mg total) by mouth as needed for erectile dysfunction. 30 tablet 1   tamsulosin (FLOMAX) 0.4 MG CAPS capsule Take 0.4 mg by mouth  daily.     testosterone cypionate (DEPOTESTOSTERONE CYPIONATE) 200 MG/ML injection Inject 1 ml into Muscle every 7 days 13 mL 1   vitamin B-12 (CYANOCOBALAMIN) 1000 MCG tablet Take 1,000 mcg by mouth daily.     VITAMIN D PO Take 5,000 Units by mouth daily.     No current facility-administered medications on file prior to visit.    Current Problems (verified) Patient Active Problem List   Diagnosis Date Noted   Seasonal allergic rhinitis 04/22/2021   Spinal cord stimulator in place 08/21/2020 11/10/2020   Chronic pain syndrome 07/08/2020   Opioid dependence 05/11/2020   Benign prostatic hyperplasia with nocturia 10/10/2019   Aortic atherosclerosis ([place]) by Abd CT scan in 05/2015 10/09/2019   Cervical fusion syndrome 05/23/2019   Lumbar post-laminectomy syndrome 05/23/2019   Gastroesophageal reflux disease without esophagitis 12/26/2018   Overweight (BMI 25.0-29.9) 01/23/2018   COPD (chronic obstructive pulmonary disease) 04/03/2017   S/P lumbar spinal fusion 12/27/2016   Medication management 09/09/2015   Spondylolisthesis of lumbar region 04/28/2015   Testosterone deficiency 11/06/2014   Lumbar degenerative disc disease 09/23/2014   Essential hypertension 08/13/2013   Hyperlipidemia, mixed 08/13/2013   Abnormal glucose (hx of prediabetes) 08/13/2013   Vitamin D deficiency 08/13/2013   Impotence of organic origin 08/13/2013   Diverticulosis of large intestine 04/01/2008    Screening Tests Immunization History  Administered Date(s) Administered   Influenza Split 06/18/2015   Influenza, High Dose Seasonal PF 06/20/2016, 06/06/2017, 07/31/2018, 08/18/2020, 07/06/2021   Influenza-Unspecified 08/17/2014, 07/10/2019   PFIZER(Purple Top)SARS-COV-2 Vaccination 10/18/2019, 11/08/2019, 06/26/2020   PPD Test 05/27/2014   Pfizer Covid-19 Vaccine Bivalent Booster 23yrs & up 08/04/2021   Pneumococcal Conjugate-13 12/09/2015   Pneumococcal Polysaccharide-23 04/08/2013, 06/06/2017   Td  09/13/2003  Tdap 05/27/2014   Zoster Recombinat (Shingrix) 11/29/2017, 02/08/2018    Preventative care: Last colonoscopy: 09/2018, due 10 years 2030  Prior vaccinations: TD or Tdap: 2015  Influenza: 08/2020 Pneumococcal: 2014, 2018 Prevnar13: 2017 Shingrix: 2/2, 2019 Covid 19: 3/3, 2021, pfizer - booster information requested  Names of Other Physician/Practitioners you currently use: 1. [facility] here for primary care 2. Dr. Sabra Heck, eye doctor, last visit June 2021 will schedule appt 3. Dr. Rona Ravens, dentist, last visit 05/2021, goes q75m  Patient Care Team: Unk Pinto, MD as PCP - General (Internal Medicine) Inda Castle, MD (Inactive) as Consulting Physician (Gastroenterology) Kathie Rhodes, MD (Inactive) as Consulting Physician (Urology) Barbaraann Cao, OD as Referring Physician (Optometry) Druscilla Brownie, MD as Consulting Physician (Dermatology) Earnie Larsson, MD as Consulting Physician (Neurosurgery)  Allergies Allergies  Allergen Reactions   Other Hives    Beef-hives  RED MEAT   Aspirin Hives and Rash   Salicylates Rash    SURGICAL HISTORY He  has a past surgical history that includes Cervical fusion (2011/2012); Knee arthroscopy; Septoplasty; Shoulder arthroscopy (Left, 2015); Shoulder surgery (Right, 08); Colonoscopy; Back surgery (2016 x 2 L3-L5, 12/2016 S1); Shoulder arthroscopy with rotator cuff repair (Right, 08/18/2017); Spinal cord stimulator implant (08/21/2020); and Prostate surgery (10/07/2019). FAMILY HISTORY His family history includes Diabetes in his father and mother; Hypertension in his father; Leukemia in his mother. SOCIAL HISTORY He  reports that he quit smoking about 27 years ago. His smoking use included cigarettes. He has never used smokeless tobacco. He reports that he does not currently use alcohol. He reports that he does not use drugs.  MEDICARE WELLNESS OBJECTIVES: Physical activity:    Cardiac risk factors:   Depression/mood screen:      08/29/2022    8:19 PM  Depression screen PHQ 2/9  Decreased Interest 0  Down, Depressed, Hopeless 0  PHQ - 2 Score 0    ADLs:     08/29/2022    8:19 PM 03/02/2022   10:29 PM  In your present state of health, do you have any difficulty performing the following activities:  Hearing?  0  Vision? 0 0  Difficulty concentrating or making decisions? 0 0  Walking or climbing stairs? 0 0  Dressing or bathing? 0 0  Doing errands, shopping? 0 0     Cognitive Testing  Alert? Yes  Normal Appearance?Yes  Oriented to person? Yes  Place? Yes   Time? Yes  Recall of three objects?  3/3  Can perform simple calculations? Yes  Displays appropriate judgment?Yes  Can read the correct time from a watch face?Yes  EOL planning:     Objective:   There were no vitals filed for this visit.   There is no height or weight on file to calculate BMI.  General appearance: alert, no distress, WD/WN, male HEENT: normocephalic, sclerae anicteric, TMs pearly, nares patent, no discharge or erythema, pharynx normal Oral cavity: MMM, no lesions Neck: supple, no lymphadenopathy, no thyromegaly, no masses Heart: RRR, normal S1, S2, no murmurs. No edema Lungs: CTA bilaterally, no wheezes, rhonchi, or rales Abdomen: +bs, soft, non tender, non distended, no masses, no hepatomegaly, no splenomegaly Musculoskeletal: nontender, no swelling, no obvious deformity. Unable to reproduce left foot pain with palpation Extremities: no edema, no cyanosis, no clubbing Pulses: 2+ symmetric, upper and lower extremities, normal cap refill Neurological: alert, oriented x 3, CN2-12 intact, strength normal upper extremities and lower extremities, sensation normal throughout, DTRs 2+ throughout, no cerebellar  signs, gait normal Psychiatric: normal affect, behavior normal, pleasant   Medicare Attestation I have personally reviewed: The patient's medical and social  history Their use of alcohol, tobacco or illicit drugs Their current medications and supplements The patient's functional ability including ADLs,fall risks, home safety risks, cognitive, and hearing and visual impairment Diet and physical activities Evidence for depression or mood disorders  The patient's weight, height, BMI, and visual acuity have been recorded in the chart.  I have made referrals, counseling, and provided education to the patient based on review of the above and I have provided the patient with a written personalized care plan for preventive services.     Alycia Rossetti, NP   12/12/2022

## 2022-12-13 ENCOUNTER — Ambulatory Visit (INDEPENDENT_AMBULATORY_CARE_PROVIDER_SITE_OTHER): Payer: Medicare Other | Admitting: Nurse Practitioner

## 2022-12-13 ENCOUNTER — Encounter: Payer: Self-pay | Admitting: Nurse Practitioner

## 2022-12-13 VITALS — BP 108/70 | HR 67 | Temp 97.9°F | Ht 74.0 in | Wt 191.0 lb

## 2022-12-13 DIAGNOSIS — E559 Vitamin D deficiency, unspecified: Secondary | ICD-10-CM

## 2022-12-13 DIAGNOSIS — J449 Chronic obstructive pulmonary disease, unspecified: Secondary | ICD-10-CM

## 2022-12-13 DIAGNOSIS — Z0001 Encounter for general adult medical examination with abnormal findings: Secondary | ICD-10-CM | POA: Diagnosis not present

## 2022-12-13 DIAGNOSIS — M62830 Muscle spasm of back: Secondary | ICD-10-CM

## 2022-12-13 DIAGNOSIS — M4316 Spondylolisthesis, lumbar region: Secondary | ICD-10-CM

## 2022-12-13 DIAGNOSIS — K219 Gastro-esophageal reflux disease without esophagitis: Secondary | ICD-10-CM | POA: Diagnosis not present

## 2022-12-13 DIAGNOSIS — I10 Essential (primary) hypertension: Secondary | ICD-10-CM

## 2022-12-13 DIAGNOSIS — E663 Overweight: Secondary | ICD-10-CM

## 2022-12-13 DIAGNOSIS — E782 Mixed hyperlipidemia: Secondary | ICD-10-CM

## 2022-12-13 DIAGNOSIS — I700 Atherosclerosis of aorta: Secondary | ICD-10-CM

## 2022-12-13 DIAGNOSIS — Z0000 Encounter for general adult medical examination without abnormal findings: Secondary | ICD-10-CM

## 2022-12-13 DIAGNOSIS — R6889 Other general symptoms and signs: Secondary | ICD-10-CM | POA: Diagnosis not present

## 2022-12-13 DIAGNOSIS — K5903 Drug induced constipation: Secondary | ICD-10-CM

## 2022-12-13 DIAGNOSIS — M5136 Other intervertebral disc degeneration, lumbar region: Secondary | ICD-10-CM

## 2022-12-13 DIAGNOSIS — K5730 Diverticulosis of large intestine without perforation or abscess without bleeding: Secondary | ICD-10-CM

## 2022-12-13 DIAGNOSIS — N401 Enlarged prostate with lower urinary tract symptoms: Secondary | ICD-10-CM

## 2022-12-13 DIAGNOSIS — Z79899 Other long term (current) drug therapy: Secondary | ICD-10-CM

## 2022-12-13 DIAGNOSIS — N529 Male erectile dysfunction, unspecified: Secondary | ICD-10-CM

## 2022-12-13 DIAGNOSIS — E291 Testicular hypofunction: Secondary | ICD-10-CM

## 2022-12-13 DIAGNOSIS — J302 Other seasonal allergic rhinitis: Secondary | ICD-10-CM

## 2022-12-13 DIAGNOSIS — R7309 Other abnormal glucose: Secondary | ICD-10-CM | POA: Diagnosis not present

## 2022-12-13 DIAGNOSIS — E349 Endocrine disorder, unspecified: Secondary | ICD-10-CM

## 2022-12-13 DIAGNOSIS — K921 Melena: Secondary | ICD-10-CM

## 2022-12-13 DIAGNOSIS — Z6824 Body mass index (BMI) 24.0-24.9, adult: Secondary | ICD-10-CM

## 2022-12-13 DIAGNOSIS — Z981 Arthrodesis status: Secondary | ICD-10-CM

## 2022-12-13 NOTE — Patient Instructions (Signed)

## 2022-12-14 LAB — COMPLETE METABOLIC PANEL WITH GFR
AG Ratio: 1.3 (calc) (ref 1.0–2.5)
ALT: 23 U/L (ref 9–46)
AST: 16 U/L (ref 10–35)
Albumin: 4.2 g/dL (ref 3.6–5.1)
Alkaline phosphatase (APISO): 84 U/L (ref 35–144)
BUN: 17 mg/dL (ref 7–25)
CO2: 26 mmol/L (ref 20–32)
Calcium: 9.8 mg/dL (ref 8.6–10.3)
Chloride: 104 mmol/L (ref 98–110)
Creat: 1.07 mg/dL (ref 0.70–1.28)
Globulin: 3.2 g/dL (calc) (ref 1.9–3.7)
Glucose, Bld: 92 mg/dL (ref 65–99)
Potassium: 4.6 mmol/L (ref 3.5–5.3)
Sodium: 137 mmol/L (ref 135–146)
Total Bilirubin: 0.4 mg/dL (ref 0.2–1.2)
Total Protein: 7.4 g/dL (ref 6.1–8.1)
eGFR: 74 mL/min/{1.73_m2} (ref >=60)

## 2022-12-14 LAB — LIPID PANEL
Cholesterol: 163 mg/dL (ref <200)
HDL: 48 mg/dL (ref >=40)
LDL Cholesterol (Calc): 92 mg/dL (calc)
Non-HDL Cholesterol (Calc): 115 mg/dL (calc) (ref <130)
Total CHOL/HDL Ratio: 3.4 (calc) (ref <5.0)
Triglycerides: 131 mg/dL (ref <150)

## 2022-12-14 LAB — CBC WITH DIFFERENTIAL/PLATELET
Absolute Monocytes: 274 cells/uL (ref 200–950)
Basophils Absolute: 30 cells/uL (ref 0–200)
Basophils Relative: 0.9 %
Eosinophils Absolute: 83 cells/uL (ref 15–500)
Eosinophils Relative: 2.5 %
HCT: 43.6 % (ref 38.5–50.0)
Hemoglobin: 15.1 g/dL (ref 13.2–17.1)
Lymphs Abs: 1065.9 cells/uL (ref 850–3900)
MCH: 33.6 pg — ABNORMAL HIGH (ref 27.0–33.0)
MCHC: 34.6 g/dL (ref 32.0–36.0)
MCV: 96.9 fL (ref 80.0–100.0)
MPV: 10.5 fL (ref 7.5–12.5)
Monocytes Relative: 8.3 %
Neutro Abs: 1848 cells/uL (ref 1500–7800)
Neutrophils Relative %: 56 %
Platelets: 287 10*3/uL (ref 140–400)
RBC: 4.5 10*6/uL (ref 4.20–5.80)
RDW: 13.2 % (ref 11.0–15.0)
Total Lymphocyte: 32.3 %
WBC: 3.3 10*3/uL — ABNORMAL LOW (ref 3.8–10.8)

## 2022-12-14 LAB — MAGNESIUM: Magnesium: 2.2 mg/dL (ref 1.5–2.5)

## 2022-12-14 LAB — TESTOSTERONE: Testosterone: 395 ng/dL (ref 250–827)

## 2023-01-12 ENCOUNTER — Other Ambulatory Visit: Payer: Self-pay

## 2023-01-12 DIAGNOSIS — K921 Melena: Secondary | ICD-10-CM

## 2023-01-12 DIAGNOSIS — Z1212 Encounter for screening for malignant neoplasm of rectum: Secondary | ICD-10-CM

## 2023-01-12 DIAGNOSIS — Z1211 Encounter for screening for malignant neoplasm of colon: Secondary | ICD-10-CM

## 2023-01-12 LAB — POC HEMOCCULT BLD/STL (HOME/3-CARD/SCREEN)
Card #2 Fecal Occult Blod, POC: NEGATIVE
Card #3 Fecal Occult Blood, POC: NEGATIVE
Fecal Occult Blood, POC: NEGATIVE

## 2023-02-02 ENCOUNTER — Other Ambulatory Visit: Payer: Self-pay | Admitting: Internal Medicine

## 2023-02-02 ENCOUNTER — Other Ambulatory Visit: Payer: Self-pay

## 2023-02-02 DIAGNOSIS — J302 Other seasonal allergic rhinitis: Secondary | ICD-10-CM

## 2023-02-02 MED ORDER — FEXOFENADINE HCL 180 MG PO TABS
180.0000 mg | ORAL_TABLET | Freq: Every day | ORAL | 3 refills | Status: AC
Start: 2023-02-02 — End: ?

## 2023-02-19 ENCOUNTER — Other Ambulatory Visit: Payer: Self-pay | Admitting: Internal Medicine

## 2023-02-19 DIAGNOSIS — I10 Essential (primary) hypertension: Secondary | ICD-10-CM

## 2023-03-14 ENCOUNTER — Other Ambulatory Visit: Payer: Self-pay | Admitting: Internal Medicine

## 2023-03-21 ENCOUNTER — Ambulatory Visit: Payer: Medicare Other | Admitting: Internal Medicine

## 2023-03-21 ENCOUNTER — Encounter: Payer: Self-pay | Admitting: Internal Medicine

## 2023-03-21 VITALS — BP 128/60 | HR 65 | Temp 97.8°F | Resp 16 | Ht 74.0 in | Wt 192.4 lb

## 2023-03-21 DIAGNOSIS — R600 Localized edema: Secondary | ICD-10-CM

## 2023-03-21 DIAGNOSIS — I872 Venous insufficiency (chronic) (peripheral): Secondary | ICD-10-CM

## 2023-03-21 DIAGNOSIS — E782 Mixed hyperlipidemia: Secondary | ICD-10-CM

## 2023-03-21 DIAGNOSIS — E559 Vitamin D deficiency, unspecified: Secondary | ICD-10-CM | POA: Diagnosis not present

## 2023-03-21 DIAGNOSIS — R7309 Other abnormal glucose: Secondary | ICD-10-CM

## 2023-03-21 DIAGNOSIS — I700 Atherosclerosis of aorta: Secondary | ICD-10-CM

## 2023-03-21 DIAGNOSIS — I10 Essential (primary) hypertension: Secondary | ICD-10-CM

## 2023-03-21 DIAGNOSIS — Z79899 Other long term (current) drug therapy: Secondary | ICD-10-CM

## 2023-03-21 DIAGNOSIS — G894 Chronic pain syndrome: Secondary | ICD-10-CM

## 2023-03-21 LAB — CBC WITH DIFFERENTIAL/PLATELET
Absolute Monocytes: 263 cells/uL (ref 200–950)
Basophils Absolute: 29 cells/uL (ref 0–200)
MCHC: 34 g/dL (ref 32.0–36.0)
MPV: 10.6 fL (ref 7.5–12.5)
Neutro Abs: 2171 cells/uL (ref 1500–7800)
Neutrophils Relative %: 60.3 %
Platelets: 210 10*3/uL (ref 140–400)
RBC: 4.31 10*6/uL (ref 4.20–5.80)
Total Lymphocyte: 29.1 %
WBC: 3.6 10*3/uL — ABNORMAL LOW (ref 3.8–10.8)

## 2023-03-21 MED ORDER — TORSEMIDE 20 MG PO TABS
ORAL_TABLET | ORAL | 3 refills | Status: DC
Start: 2023-03-21 — End: 2023-09-03

## 2023-03-21 NOTE — Progress Notes (Signed)
Future Appointments  Date Time Provider Department  03/21/2023                  6 mo ov  11:30 AM Lucky Cowboy, MD [facility]  09/04/2023               cpe 10:00 AM Lucky Cowboy, MD [facility]  12/07/2023                wellness 11:00 AM Raynelle Dick, NP [facility]    History of Present Illness:       This very nice 72 y.o. MBM presents for 3 month follow up with HTN, HLD, Pre-Diabetes and Vitamin D Deficiency.  Patient has GERD controlled by diet /Prilosec. Abd CT  scan in Sept 2016 showed Aortic Atherosclerosis.                                                  Patient has chronic neck & LBP ( DDD /DJD ) and had 2 neck & 4 back surgeries with last L5-S1 Decompression /Fusion in 2018 by Dr Dutch Quint. He follows with Dr Juanetta Gosling in Pain Mgmt & is on chronic Opioids & has a spinal cord stimulator ( Dec 2021 - Dr Lorrine Kin).         Patient is treated for HTN  (2013)  & BP has been controlled at home. Today's BP is at goal - 128/50. Patient has had no complaints of any cardiac type chest pain, palpitations, dyspnea / orthopnea / PND, dizziness, claudication, or dependent edema.       Hyperlipidemia is controlled with diet & Atorvastatin. Patient denies myalgias or other med SE's. Last Lipids were at goal :  Lab Results  Component Value Date   CHOL 163 12/13/2022   HDL 48 12/13/2022   LDLCALC 92 12/13/2022   TRIG 131 12/13/2022   CHOLHDL 3.4 12/13/2022     Also, the patient has  Moderate Obesity (BMI 31+)  and prediabetes (A1c 6.0% /2014)  and has had no symptoms of reactive hypoglycemia, diabetic polys, paresthesias or visual blurring.  Last A1c was not at goal :  Lab Results  Component Value Date   HGBA1C 5.9 (H) 08/30/2022                                                          Further, the patient also has history of Vitamin D Deficiency and Last vitamin D was at goal :  Lab Results  Component Value Date   VD25OH 53 08/30/2022     Current Outpatient  Medications on File Prior to Visit  Medication Sig   atorvastatin (LIPITOR) 40 MG tablet TAKE 1 TABLET 3 DAYS A WEEK FOR CHOLESTEROL.   diclofenac (VOLTAREN) 75 MG EC tablet TAKE 1 TABLET 2 X DAY WITH FOOD FOR PAIN & INFLAMMATION.   Docusate Calcium (STOOL SOFTENER PO) Take by mouth. Takes 3 Tabs in the morning.   fexofenadine (ALLEGRA) 180 MG tablet Take 1 tablet (180 mg total) by mouth daily.   Flaxseed, Linseed, (FLAX SEED OIL) 1000 MG CAPS Take 1 capsule (1,000 mg total) by mouth 2 (two) times daily.   fluticasone (  FLONASE) 50 MCG/ACT nasal spray PLACE 1-2 SPRAYS INTO BOTH NOSTRILS DAILY.   furosemide (LASIX) 40 MG tablet TAKE 1 TABLET TWICE A DAY FOR FLUID RETENTION AND LEG SWELLIN   Magnesium 250 MG TABS Take 250 mg by mouth daily.   Needles & Syringes MISC Use for Testosterone injection  every  week   olmesartan (BENICAR) 40 MG tablet Take  1 tablet  Daily for BP                                                    /                                             TAKE                                                             BY                                                 MOUTH   omeprazole (PRILOSEC) 20 MG capsule TAKE 1 CAPSULE BY MOUTH  TWICE DAILY AS NEEDED FOR  INDIGESTION AND ACID REFLUX   oxyCODONE (OXY IR/ROXICODONE) 5 MG immediate release tablet Take 5 mg by mouth every 4 (four) hours as needed for severe pain.   tamsulosin (FLOMAX) 0.4 MG CAPS capsule Take 0.4 mg by mouth daily.   testosterone cypionate (DEPOTESTOSTERONE CYPIONATE) 200 MG/ML injection INJECT 1 ML INTO MUSCLE EVERY 7 DAYS   vitamin B-12 (CYANOCOBALAMIN) 1000 MCG tablet Take 1,000 mcg by mouth daily.   VITAMIN D PO Take 5,000 Units by mouth daily.   sildenafil (VIAGRA) 100 MG tablet Take 1 tablet (100 mg total) by mouth as needed for erectile dysfunction.   No current facility-administered medications on file prior to visit.     Allergies  Allergen Reactions   Other Hives    Beef-hives  RED MEAT   Aspirin  Hives and Rash   Salicylates Rash     PMHx:   Past Medical History:  Diagnosis Date   Allergy    Anxiety    takes Valium daily as needed   Arthritis    in neck   BPH (benign prostatic hyperplasia)    takes Flomax daily   Brachial radiculitis 08/14/2013   Chronic back pain    DDD   COPD (chronic obstructive pulmonary disease) (HCC)    Diverticulosis    Foot drop    both feet   GERD (gastroesophageal reflux disease)    takes Omeprazole daily as well as Zantac   Hyperlipidemia    takes Atorvastatin daily   Hypertension    Hypogonadism male    Nocturia    Numbness    only in 4th and 5th finger on left hand and left leg   Prediabetes 12/26/2018   Reflux esophagitis 08/13/2013   Seasonal allergies    takes  Claritin daily as needed and uses Flonase daily as needed   Urinary frequency    takes Flomax daily   Wears hearing aid 10/10/2019     Immunization History  Administered Date(s) Administered   Influenza Split 06/18/2015   Influenza, High Dose  06/20/2016, 06/06/2017, 07/31/2018, 08/18/2020, 07/06/2021   Influenza-Unspecified 08/17/2014, 07/10/2019   PFIZER SARS-COV-2 Vacc 10/18/2019, 11/08/2019, 06/26/2020   PPD Test 05/27/2014   Pfizer Covid-19 Vaccine 08/04/2021   Pneumococcal -13 12/09/2015   Pneumococcal -23 04/08/2013, 06/06/2017   Td 09/13/2003   Tdap 05/27/2014   Zoster Recombinant(Shingrix) 11/29/2017, 02/08/2018     Past Surgical History:  Procedure Laterality Date   BACK SURGERY  2016 x 2 L3-L5,   12/2016 S1   lumbar fusion x 3   CERVICAL FUSION  2011/2012   COLONOSCOPY     KNEE ARTHROSCOPY     right 1985; left 1992   PROSTATE SURGERY  10/07/2019   Rezium water vapor procedure by Dr. Alvester Morin   SEPTOPLASTY     SHOULDER ARTHROSCOPY Left 2015   SHOULDER ARTHROSCOPY WITH ROTATOR CUFF REPAIR Right 08/18/2017   Dr. Rennis Chris   SHOULDER SURGERY Right 08   SPINAL CORD STIMULATOR IMPLANT  08/21/2020   Dr. Janey Genta    FHx:    Reviewed /  unchanged  SHx:    Reviewed / unchanged   Systems Review:  Constitutional: Denies fever, chills, wt changes, headaches, insomnia, fatigue, night sweats, change in appetite. Eyes: Denies redness, blurred vision, diplopia, discharge, itchy, watery eyes.  ENT: Denies discharge, congestion, post nasal drip, epistaxis, sore throat, earache, hearing loss, dental pain, tinnitus, vertigo, sinus pain, snoring.  CV: Denies chest pain, palpitations, irregular heartbeat, syncope, dyspnea, diaphoresis, orthopnea, PND, claudication or edema. Respiratory: denies cough, dyspnea, DOE, pleurisy, hoarseness, laryngitis, wheezing.  Gastrointestinal: Denies dysphagia, odynophagia, heartburn, reflux, water brash, abdominal pain or cramps, nausea, vomiting, bloating, diarrhea, constipation, hematemesis, melena, hematochezia  or hemorrhoids. Genitourinary: Denies dysuria, frequency, urgency, nocturia, hesitancy, discharge, hematuria or flank pain. Musculoskeletal: Denies arthralgias, myalgias, stiffness, jt. swelling, pain, limping or strain/sprain.  Skin: Denies pruritus, rash, hives, warts, acne, eczema or change in skin lesion(s). Neuro: No weakness, tremor, incoordination, spasms, paresthesia or pain. Psychiatric: Denies confusion, memory loss or sensory loss. Endo: Denies change in weight, skin or hair change.  Heme/Lymph: No excessive bleeding, bruising or enlarged lymph nodes.  Physical Exam  BP 128/60   Pulse 65   Temp 97.8 F (36.6 C)   Resp 16   Ht 6\' 2"  (1.88 m)   Wt 192 lb 6.4 oz (87.3 kg)   SpO2 96%   BMI 24.70 kg/m   Appears  well nourished, well groomed  and in no distress.  Eyes: PERRLA, EOMs, conjunctiva no swelling or erythema. Sinuses: No frontal/maxillary tenderness ENT/Mouth: EAC's clear, TM's nl w/o erythema, bulging. Nares clear w/o erythema, swelling, exudates. Oropharynx clear without erythema or exudates. Oral hygiene is good. Tongue normal, non obstructing. Hearing intact.   Neck: Supple. Thyroid not palpable. Car 2+/2+ without bruits, nodes or JVD. Chest: Respirations nl with BS clear & equal w/o rales, rhonchi, wheezing or stridor.  Cor: Heart sounds normal w/ regular rate and rhythm without sig. murmurs, gallops, clicks or rubs. Peripheral pulses normal and equal  without edema.  Abdomen: Soft & bowel sounds normal. Non-tender w/o guarding, rebound, hernias, masses or organomegaly.  Lymphatics: Unremarkable.  Musculoskeletal: Full ROM all peripheral extremities, joint stability, 5/5 strength and normal gait.  Skin: Warm, dry without exposed rashes, lesions or  ecchymosis apparent.  Neuro: Cranial nerves intact, reflexes equal bilaterally. Sensory-motor testing grossly intact. Tendon reflexes grossly intact.  Pysch: Alert & oriented x 3.  Insight and judgement nl & appropriate. No ideations.  Assessment and Plan:   1. Essential hypertension  - Continue medication, monitor blood pressure at home.  - Continue DASH diet.  Reminder to go to the ER if any CP,  SOB, nausea, dizziness, severe HA, changes vision/speech.    - CBC with Differential/Platelet - COMPLETE METABOLIC PANEL WITH GFR - Magnesium - TSH   2. Hyperlipidemia, mixed  - Continue diet/meds, exercise,& lifestyle modifications.   - Lipid panel - TSH   3. Abnormal glucose  - Continue diet, exercise  - Lifestyle modifications.  - Monitor appropriate     - Hemoglobin A1C w/out eAG - Insulin, random   4. Vitamin D deficiency  - Continue supplementation     - VITAMIN D 25 Hydroxy   5. Aortic atherosclerosis (HCC) by Abd CT scan in 05/2015  - Lipid panel   6. Chronic pain syndrome   7. Medication management  - CBC with Differential/Platelet - COMPLETE METABOLIC PANEL WITH GFR - Magnesium - Lipid panel - TSH - Hemoglobin A1C w/out eAG - Insulin, random - VITAMIN D 25 Hydroxy          Discussed  regular exercise, BP monitoring, weight control to achieve/maintain BMI  less than 25 and discussed med and SE's. Recommended labs to assess /monitor clinical status .  I discussed the assessment and treatment plan with the patient. The patient was provided an opportunity to ask questions and all were answered. The patient agreed with the plan and demonstrated an understanding of the instructions.  I provided over 30 minutes of exam, counseling, chart review and  complex critical decision making.        The patient was advised to call back or seek an in-person evaluation if the symptoms worsen or if the condition fails to improve as anticipated.   Marinus Maw, MD

## 2023-03-21 NOTE — Patient Instructions (Signed)

## 2023-03-22 LAB — CBC WITH DIFFERENTIAL/PLATELET
Basophils Relative: 0.8 %
Eosinophils Absolute: 90 cells/uL (ref 15–500)
Eosinophils Relative: 2.5 %
HCT: 42.1 % (ref 38.5–50.0)
Hemoglobin: 14.3 g/dL (ref 13.2–17.1)
Lymphs Abs: 1048 cells/uL (ref 850–3900)
MCH: 33.2 pg — ABNORMAL HIGH (ref 27.0–33.0)
MCV: 97.7 fL (ref 80.0–100.0)
Monocytes Relative: 7.3 %
RDW: 12.8 % (ref 11.0–15.0)

## 2023-03-22 LAB — LIPID PANEL
Cholesterol: 143 mg/dL (ref <200)
HDL: 43 mg/dL (ref >=40)
LDL Cholesterol (Calc): 74 mg/dL (calc)
Non-HDL Cholesterol (Calc): 100 mg/dL (calc) (ref <130)
Total CHOL/HDL Ratio: 3.3 (calc) (ref <5.0)
Triglycerides: 159 mg/dL — ABNORMAL HIGH (ref <150)

## 2023-03-22 LAB — COMPLETE METABOLIC PANEL WITH GFR
AG Ratio: 1.4 (calc) (ref 1.0–2.5)
ALT: 20 U/L (ref 9–46)
AST: 17 U/L (ref 10–35)
Albumin: 4 g/dL (ref 3.6–5.1)
Alkaline phosphatase (APISO): 72 U/L (ref 35–144)
BUN: 19 mg/dL (ref 7–25)
CO2: 25 mmol/L (ref 20–32)
Calcium: 9.3 mg/dL (ref 8.6–10.3)
Chloride: 106 mmol/L (ref 98–110)
Creat: 1.11 mg/dL (ref 0.70–1.28)
Globulin: 2.8 g/dL (calc) (ref 1.9–3.7)
Glucose, Bld: 120 mg/dL — ABNORMAL HIGH (ref 65–99)
Potassium: 4.3 mmol/L (ref 3.5–5.3)
Sodium: 138 mmol/L (ref 135–146)
Total Bilirubin: 0.3 mg/dL (ref 0.2–1.2)
Total Protein: 6.8 g/dL (ref 6.1–8.1)
eGFR: 71 mL/min/{1.73_m2} (ref >=60)

## 2023-03-22 LAB — VITAMIN D 25 HYDROXY (VIT D DEFICIENCY, FRACTURES): Vit D, 25-Hydroxy: 91 ng/mL (ref 30–100)

## 2023-03-22 LAB — MAGNESIUM: Magnesium: 2.1 mg/dL (ref 1.5–2.5)

## 2023-03-22 LAB — INSULIN, RANDOM: Insulin: 102.6 u[IU]/mL — ABNORMAL HIGH

## 2023-03-22 LAB — HEMOGLOBIN A1C W/OUT EAG: Hgb A1c MFr Bld: 5.8 % of total Hgb — ABNORMAL HIGH (ref <5.7)

## 2023-03-22 LAB — TSH: TSH: 2.89 mIU/L (ref 0.40–4.50)

## 2023-03-22 NOTE — Progress Notes (Signed)
^<^<^<^<^<^<^<^<^<^<^<^<^<^<^<^<^<^<^<^<^<^<^<^<^<^<^<^<^<^<^<^<^<^<^<^<^ ^>^>^>^>^>^>^>^>^>^>^>>^>^>^>^>^>^>^>^>^>^>^>^>^>^>^>^>^>^>^>^>^>^>^>^>^>  -  Test results slightly outside the reference range are not unusual. If there is anything important, I will review this with you,  otherwise it is considered normal test values.  If you have further questions,  please do not hesitate to contact me at the office or via My Chart.   ^<^<^<^<^<^<^<^<^<^<^<^<^<^<^<^<^<^<^<^<^<^<^<^<^<^<^<^<^<^<^<^<^<^<^<^<^ ^>^>^>^>^>^>^>^>^>^>^>^>^>^>^>^>^>^>^>^>^>^>^>^>^>^>^>^>^>^>^>^>^>^>^>^>^  -  Chol = 143  -  Excellent   - Very low risk for Heart Attack  / Stroke  ^>^>^>^>^>^>^>^>^>^>^>^>^>^>^>^>^>^>^>^>^>^>^>^>^>^>^>^>^>^>^>^>^>^>^>^>^ ^>^>^>^>^>^>^>^>^>^>^>^>^>^>^>^>^>^>^>^>^>^>^>^>^>^>^>^>^>^>^>^>^>^>^>^>^  -  A1c = 5.8%  ( is 12 week average blood sugar) is slightly elevated  ,  So . . . Marland Kitchen    - Avoid Sweets, Candy & White Stuff   - White Rice, White [place], White Flour  - Breads &  Pasta  ^>^>^>^>^>^>^>^>^>^>^>^>^>^>^>^>^>^>^>^>^>^>^>^>^>^>^>^>^>^>^>^>^>^>^>^>^  -  Vitamin DF = 91 - Excellent - Please keep dose same   ^>^>^>^>^>^>^>^>^>^>^>^>^>^>^>^>^>^>^>^>^>^>^>^>^>^>^>^>^>^>^>^>^>^>^>^>^ ^>^>^>^>^>^>^>^>^>^>^>^>^>^>^>^>^>^>^>^>^>^>^>^>^>^>^>^>^>^>^>^>^>^>^>^>^  -  All Else - CBC - Kidneys - Electrolytes - Liver - Magnesium & Thyroid    - all  Normal / OK  ^>^>^>^>^>^>^>^>^>^>^>^>^>^>^>^>^>^>^>^>^>^>^>^>^>^>^>^>^>^>^>^>^>^>^>^>^ ^>^>^>^>^>^>^>^>^>^>^>^>^>^>^>^>^>^>^>^>^>^>^>^>^>^>^>^>^>^>^>^>^>^>^>^>^

## 2023-03-25 ENCOUNTER — Encounter: Payer: Self-pay | Admitting: Internal Medicine

## 2023-04-08 ENCOUNTER — Other Ambulatory Visit: Payer: Self-pay | Admitting: Nurse Practitioner
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
## 2023-06-26 ENCOUNTER — Ambulatory Visit: Payer: Medicare Other | Admitting: Nurse Practitioner

## 2023-06-26 ENCOUNTER — Encounter: Payer: Self-pay | Admitting: Nurse Practitioner

## 2023-06-26 VITALS — BP 118/70 | HR 79 | Temp 97.7°F | Ht 74.0 in | Wt 186.8 lb

## 2023-06-26 DIAGNOSIS — K219 Gastro-esophageal reflux disease without esophagitis: Secondary | ICD-10-CM

## 2023-06-26 DIAGNOSIS — Z23 Encounter for immunization: Secondary | ICD-10-CM

## 2023-06-26 DIAGNOSIS — M51361 Other intervertebral disc degeneration, lumbar region with lower extremity pain only: Secondary | ICD-10-CM

## 2023-06-26 DIAGNOSIS — I700 Atherosclerosis of aorta: Secondary | ICD-10-CM

## 2023-06-26 DIAGNOSIS — I10 Essential (primary) hypertension: Secondary | ICD-10-CM

## 2023-06-26 DIAGNOSIS — E782 Mixed hyperlipidemia: Secondary | ICD-10-CM | POA: Diagnosis not present

## 2023-06-26 DIAGNOSIS — Z981 Arthrodesis status: Secondary | ICD-10-CM

## 2023-06-26 DIAGNOSIS — R7309 Other abnormal glucose: Secondary | ICD-10-CM

## 2023-06-26 DIAGNOSIS — E559 Vitamin D deficiency, unspecified: Secondary | ICD-10-CM

## 2023-06-26 DIAGNOSIS — R600 Localized edema: Secondary | ICD-10-CM

## 2023-06-26 DIAGNOSIS — Z6824 Body mass index (BMI) 24.0-24.9, adult: Secondary | ICD-10-CM

## 2023-06-26 DIAGNOSIS — Z6823 Body mass index (BMI) 23.0-23.9, adult: Secondary | ICD-10-CM

## 2023-06-26 DIAGNOSIS — I872 Venous insufficiency (chronic) (peripheral): Secondary | ICD-10-CM

## 2023-06-26 DIAGNOSIS — Z79899 Other long term (current) drug therapy: Secondary | ICD-10-CM

## 2023-06-26 DIAGNOSIS — J302 Other seasonal allergic rhinitis: Secondary | ICD-10-CM

## 2023-06-26 NOTE — Patient Instructions (Signed)
Edema  Edema is an abnormal buildup of fluids in the body tissues and under the skin. Swelling of the legs, feet, and ankles is a common symptom that becomes more likely as you get older. Swelling is also common in looser tissues, such as around the eyes. Pressing on the area may make a temporary dent in your skin (pitting edema). This fluid may also accumulate in your lungs (pulmonary edema). There are many possible causes of edema. Eating too much salt (sodium) and being on your feet or sitting for a long time can cause edema in your legs, feet, and ankles. Common causes of edema include: Certain medical conditions, such as heart failure, liver or kidney disease, and cancer. Weak leg blood vessels. An injury. Pregnancy. Medicines. Being obese. Low protein levels in the blood. Hot weather may make edema worse. Edema is usually painless. Your skin may look swollen or shiny. Follow these instructions at home: Medicines Take over-the-counter and prescription medicines only as told by your health care provider. Your health care provider may prescribe a medicine to help your body get rid of extra water (diuretic). Take this medicine if you are told to take it. Eating and drinking Eat a low-salt (low-sodium) diet to reduce fluid as told by your health care provider. Sometimes, eating less salt may reduce swelling. Depending on the cause of your swelling, you may need to limit how much fluid you drink (fluid restriction). General instructions Raise (elevate) the injured area above the level of your heart while you are sitting or lying down. Do not sit still or stand for long periods of time. Do not wear tight clothing. Do not wear garters on your upper legs. Exercise your legs to get your circulation going. This helps to move the fluid back into your blood vessels, and it may help the swelling go down. Wear compression stockings as told by your health care provider. These stockings help to prevent  blood clots and reduce swelling in your legs. It is important that these are the correct size. These stockings should be prescribed by your health care provider to prevent possible injuries. If elastic bandages or wraps are recommended, use them as told by your health care provider. Contact a health care provider if: Your edema does not get better with treatment. You have heart, liver, or kidney disease and have symptoms of edema. You have sudden and unexplained weight gain. Get help right away if: You develop shortness of breath or chest pain. You cannot breathe when you lie down. You develop pain, redness, or warmth in the swollen areas. You have heart, liver, or kidney disease and suddenly get edema. You have a fever and your symptoms suddenly get worse. These symptoms may be an emergency. Get help right away. Call 911. Do not wait to see if the symptoms will go away. Do not drive yourself to the hospital. Summary Edema is an abnormal buildup of fluids in the body tissues and under the skin. Eating too much salt (sodium)and being on your feet or sitting for a long time can cause edema in your legs, feet, and ankles. Raise (elevate) the injured area above the level of your heart while you are sitting or lying down. Follow your health care provider's instructions about diet and how much fluid you can drink. This information is not intended to replace advice given to you by your health care provider. Make sure you discuss any questions you have with your health care provider. Document Revised: 05/03/2021 Document  Reviewed: 05/03/2021 Elsevier Patient Education  2024 [employer].

## 2023-06-27 LAB — CBC WITH DIFFERENTIAL/PLATELET
Absolute Monocytes: 304 {cells}/uL (ref 200–950)
Basophils Absolute: 12 {cells}/uL (ref 0–200)
Basophils Relative: 0.3 %
Eosinophils Absolute: 70 {cells}/uL (ref 15–500)
Eosinophils Relative: 1.8 %
HCT: 42.6 % (ref 38.5–50.0)
Hemoglobin: 14.4 g/dL (ref 13.2–17.1)
Lymphs Abs: 807 {cells}/uL — ABNORMAL LOW (ref 850–3900)
MCH: 33.3 pg — ABNORMAL HIGH (ref 27.0–33.0)
MCHC: 33.8 g/dL (ref 32.0–36.0)
MCV: 98.6 fL (ref 80.0–100.0)
MPV: 10.5 fL (ref 7.5–12.5)
Monocytes Relative: 7.8 %
Neutro Abs: 2707 {cells}/uL (ref 1500–7800)
Neutrophils Relative %: 69.4 %
Platelets: 201 10*3/uL (ref 140–400)
RBC: 4.32 10*6/uL (ref 4.20–5.80)
RDW: 13 % (ref 11.0–15.0)
Total Lymphocyte: 20.7 %
WBC: 3.9 10*3/uL (ref 3.8–10.8)

## 2023-06-27 LAB — COMPLETE METABOLIC PANEL WITH GFR
AG Ratio: 1.2 (calc) (ref 1.0–2.5)
ALT: 21 U/L (ref 9–46)
AST: 16 U/L (ref 10–35)
Albumin: 3.8 g/dL (ref 3.6–5.1)
Alkaline phosphatase (APISO): 69 U/L (ref 35–144)
BUN: 19 mg/dL (ref 7–25)
CO2: 27 mmol/L (ref 20–32)
Calcium: 9.5 mg/dL (ref 8.6–10.3)
Chloride: 106 mmol/L (ref 98–110)
Creat: 1.16 mg/dL (ref 0.70–1.28)
Globulin: 3.2 g/dL (ref 1.9–3.7)
Glucose, Bld: 107 mg/dL — ABNORMAL HIGH (ref 65–99)
Potassium: 4.6 mmol/L (ref 3.5–5.3)
Sodium: 139 mmol/L (ref 135–146)
Total Bilirubin: 0.3 mg/dL (ref 0.2–1.2)
Total Protein: 7 g/dL (ref 6.1–8.1)
eGFR: 67 mL/min/{1.73_m2} (ref >=60)

## 2023-06-27 LAB — MAGNESIUM: Magnesium: 2.3 mg/dL (ref 1.5–2.5)

## 2023-06-27 LAB — LIPID PANEL
Cholesterol: 165 mg/dL (ref <200)
HDL: 47 mg/dL (ref >=40)
LDL Cholesterol (Calc): 91 mg/dL
Non-HDL Cholesterol (Calc): 118 mg/dL (ref <130)
Total CHOL/HDL Ratio: 3.5 (calc) (ref <5.0)
Triglycerides: 177 mg/dL — ABNORMAL HIGH (ref <150)

## 2023-08-30 ENCOUNTER — Telehealth: Payer: Self-pay | Admitting: Nurse Practitioner

## 2023-08-30 ENCOUNTER — Other Ambulatory Visit: Payer: Self-pay | Admitting: Nurse Practitioner

## 2023-08-30 NOTE — Telephone Encounter (Signed)
In my last visit with him he stated he was stopping this medication as it did not help.  Also he needs to not take this regularly to protect his kidneys so if he can avoid I would rather he did

## 2023-08-30 NOTE — Telephone Encounter (Signed)
Refill on Voltaren. Please send to [id number] - [place], [place] - [address]

## 2023-08-31 ENCOUNTER — Other Ambulatory Visit: Payer: Self-pay | Admitting: Nurse Practitioner

## 2023-08-31 MED ORDER — DICLOFENAC SODIUM 75 MG PO TBEC: DELAYED_RELEASE_TABLET | ORAL | 0 refills | Status: DC

## 2023-08-31 NOTE — Telephone Encounter (Signed)
LMOM to call back

## 2023-09-02 ENCOUNTER — Other Ambulatory Visit: Payer: Self-pay | Admitting: Internal Medicine

## 2023-09-03 ENCOUNTER — Encounter: Payer: Self-pay | Admitting: Internal Medicine

## 2023-09-03 NOTE — Progress Notes (Unsigned)
Jacob Larsen      ADULT   &   ADOLESCENT      INTERNAL MEDICINE  Jacob Larsen, M.D.          Rance Muir, ANP        Adela Glimpse, FNP  [facility] [address]  [place], [place]. [address] Telephone [phone] Telefax [phone]   Annual  Screening/Preventative Visit  & Comprehensive Evaluation & Examination   Future Appointments  Date Time Provider Department  09/04/2023                  cpe 10:00 AM Jacob Cowboy, MD [facility]  12/07/2023                 wellness 11:00 AM Raynelle Dick, NP [facility]  09/23/2024                  cpe 10:00 AM Jacob Cowboy, MD [facility]           This very nice 72 y.o. MBM with    HTN, HLD, Prediabetes and Vitamin D Deficiency  presents for a Screening /Preventative Visit & comprehensive evaluation and management of multiple medical co-morbidities.   Patient has GERD controlled by diet & Prilosec.  Dr Alvester Morin (Urologist) follows patient for [facility] /LUTS s/p  Rezium vaporization procedure (2021) .  Abd CT  scan in 2016 showed Aortic Atherosclerosis.                                           Patient has DDD /DJD with chronic neck & LBP and had 2 neck & 4 back surgeries with last L5-S1 Decompression /Fusion in 2018 by Dr Dutch Quint. He follows in Pain Mgmt with Dr Juanetta Gosling on chronic Opioids & has a spinal cord stimulator ( Dec [id number] - Dr Lorrine Kin).          HTN predates circa 2013. Patient's BP has been running lower at home & he stopped his meds about 3 weeks ago & continues to monitor his BP's.  Today's BP is at goal - 132/86 . Patient denies any cardiac symptoms as chest pain, palpitations, shortness of breath, dizziness or ankle swelling.        Patient's hyperlipidemia is controlled with diet and Atorvastatin Deretha Emory. Patient denies myalgias or other medication SE's. Last lipids were at goal  except elevated Trig's :  Lab Results  Component Value Date   CHOL 165 06/26/2023   HDL 47 06/26/2023    LDLCALC 91 06/26/2023   TRIG 177 (H) 06/26/2023   CHOLHDL 3.5 06/26/2023         Patient has Moderate Obesity (BMI 31.1+) with consequent  prediabetes (A1c 6.0% /2014) and patient denies reactive hypoglycemic symptoms, visual blurring, diabetic polys or paresthesias. Last A1c was normal & at goal :   Lab Results  Component Value Date   VD25OH 91 03/21/2023                                   Patient is on parenteral Testosterone Replacement for Testosterone Deficiency with improved sense of well-being & stamina.        Finally, patient has history of Vitamin D Deficiency & last vitamin D was at goal :   Lab Results  Component Value Date   VD25OH  62 03/03/2022       Current Outpatient Medications:     atorvastatin  40 MG tablet, TAKE 1 TABLET 3 DAYS A WEEK    diclofenac  75 MG EC tablet, TAKE 1 TABLET 2 X DAY    fexofenadine 180 MG tablet, TAKE 1 TABLET EVERY DAY   FLAX SEED OIL 1000 MG CAPS, Take 1 capsule  2 times daily   FLONASE nasal spray, PLACE 1-2 SPRAYS INTO BOTH NOSTRILS DAILY   Magnesium 250 MG TABS, Take  daily   olmesartan 40 MG tablet, Take  1 tablet Daily    omeprazole  20 MG capsule, TAKE 1 CAPSULE   TWICE DAILY    oxyCODONE (OXY IR/ROXICODONE) 5 MG , Take every 4  hours as needed    sildenafil  100 MG tablet, Take 1 tablet  as needed    tamsulosin  0.4 MG CAPS, Take  daily   testosterone cypio 200 MG/ML injection, Inject 1 ml into Muscle every 7 days   vitamin B-12    1000 MCG tablet, Take  daily   VITAMIN D  5,000 Units daily    Allergies  Allergen Reactions   Other Hives    Beef-hives / RED MEAT   Aspirin Hives and Rash   Salicylates Rash     Past Medical History:  Diagnosis Date   Allergy    Anxiety    takes Valium daily as needed   Arthritis    in neck   BPH (benign prostatic hyperplasia)    takes Flomax daily   Brachial radiculitis 08/14/2013   Chronic back pain    DDD   COPD (chronic obstructive pulmonary disease) (HCC)     Diverticulosis    Foot drop    both feet   GERD (gastroesophageal reflux disease)    takes Omeprazole daily as well as Zantac   Hyperlipidemia    takes Atorvastatin daily   Hypertension    Hypogonadism male    Nocturia    Numbness    only in 4th and 5th finger on left hand and left leg   Prediabetes 12/26/2018   Reflux esophagitis 08/13/2013   Seasonal allergies    takes Claritin daily as needed and uses Flonase daily as needed   Urinary frequency    takes Flomax daily   Wears hearing aid 10/10/2019     Health Maintenance  Topic Date Due   COVID-19 Vaccine (4 - Booster for Pfizer series) 08/21/2020   TETANUS/TDAP  05/27/2024   COLONOSCOPY  10/08/2028   Pneumonia Vaccine 79+ Years old  Completed   INFLUENZA VACCINE  Completed   Hepatitis C Screening  Completed   Zoster Vaccines- Shingrix  Completed   HPV VACCINES  Aged Out     Immunization History  Administered Date(s) Administered   Influenza  06/18/2015   Influenza, High Dose  109/25/2018, 07/31/2018, 08/18/2020, 07/06/2021   Influenza 08/17/2014, 07/10/2019   PFIZER SARS-COV-2 Vacc 10/18/2019, 11/08/2019, 06/26/2020   PPD Test 05/27/2014   Pneumococcal -13 12/09/2015   Pneumococcal -23 04/08/2013, 06/06/2017   Td 09/13/2003   Tdap 05/27/2014   Zoster Recombinat (Shingrix) 11/29/2017, 02/08/2018    Last Colon - 10/08/2018 - Dr Myrtie Neither  -  Diverticulosis - Recc no f/u due to age    Past Surgical History:  Procedure Laterality Date   BACK SURGERY  2016 x 2 L3-L5, 12/2016 S1   lumbar fusion x 3   CERVICAL FUSION  2011/2012   COLONOSCOPY     KNEE  ARTHROSCOPY     right 1985; left 1992   PROSTATE SURGERY  10/07/2019   Rezium water vapor procedure by Dr. Alvester Morin   SEPTOPLASTY     SHOULDER ARTHROSCOPY Left 2015   SHOULDER ARTHROSCOPY WITH ROTATOR CUFF REPAIR Right 08/18/2017   Dr. Rennis Chris   SHOULDER SURGERY Right    SPINAL CORD STIMULATOR IMPLANT  08/21/2020   Dr. Janey Genta     Family History  Problem  Relation Age of Onset   Leukemia Mother    Diabetes Mother    Hypertension Father    Diabetes Father    Colon cancer Neg Hx    Stomach cancer Neg Hx    Esophageal cancer Neg Hx    Rectal cancer Neg Hx      Social History   Tobacco Use   Smoking status: Former    Types: Cigarettes    Quit date: 05/28/1995    Years since quitting: 26.2   Smokeless tobacco: Never   Tobacco comments:    quit smoking >20+yrs ago  Vaping Use   Vaping Use: Never used  Substance Use Topics   Alcohol use: Not Currently    Alcohol/week: 0.0 standard drinks    Comment: rarely   Drug use: No      ROS Constitutional: Denies fever, chills, weight loss/gain, headaches, insomnia,  night sweats or change in appetite. Does c/o fatigue. Eyes: Denies redness, blurred vision, diplopia, discharge, itchy or watery eyes.  ENT: Denies discharge, congestion, post nasal drip, epistaxis, sore throat, earache, hearing loss, dental pain, Tinnitus, Vertigo, Sinus pain or snoring.  Cardio: Denies chest pain, palpitations, irregular heartbeat, syncope, dyspnea, diaphoresis, orthopnea, PND, claudication or edema Respiratory: denies cough, dyspnea, DOE, pleurisy, hoarseness, laryngitis or wheezing.  Gastrointestinal: Denies dysphagia, heartburn, reflux, water brash, pain, cramps, nausea, vomiting, bloating, diarrhea, constipation, hematemesis, melena, hematochezia, jaundice or hemorrhoids Genitourinary: Denies dysuria, frequency, urgency, nocturia, hesitancy, discharge, hematuria or flank pain Musculoskeletal: Denies arthralgia, myalgia, stiffness, Jt. Swelling, pain, limp or strain/sprain. Denies Falls. Skin: Denies puritis, rash, hives, warts, acne, eczema or change in skin lesion Neuro: No weakness, tremor, incoordination, spasms, paresthesia or pain Psychiatric: Denies confusion, memory loss or sensory loss. Denies Depression. Endocrine: Denies change in weight, skin, hair change, nocturia, and paresthesia, diabetic  polys, visual blurring or hyper / hypo glycemic episodes.  Heme/Lymph: No excessive bleeding, bruising or enlarged lymph nodes.   Physical Exam  BP 132/86   Pulse 64   Temp 98.9 F (37.2 C)   Ht 6' (1.829 m)   Wt 184 lb 6.4 oz (83.6 kg)   SpO2 99%   BMI 25.01 kg/m   General Appearance: Well nourished and well groomed and in no apparent distress.  Eyes: PERRLA, EOMs, conjunctiva no swelling or erythema, normal fundi and vessels. Sinuses: No frontal/maxillary tenderness ENT/Mouth: EACs patent / TMs  nl. Nares clear without erythema, swelling, mucoid exudates. Oral hygiene is good. No erythema, swelling, or exudate. Tongue normal, non-obstructing. Tonsils not swollen or erythematous. Hearing normal.  Neck: Supple, thyroid not palpable. No bruits, nodes or JVD. Respiratory: Respiratory effort normal.  BS equal and clear bilateral without rales, rhonci, wheezing or stridor. Cardio: Heart sounds are normal with regular rate and rhythm and no murmurs, rubs or gallops. Peripheral pulses are normal and equal bilaterally without edema. No aortic or femoral bruits. Chest: symmetric with normal excursions and percussion.  Abdomen: Soft, with Nl bowel sounds. Nontender, no guarding, rebound, hernias, masses, or organomegaly.  Lymphatics: Non tender without lymphadenopathy.  Musculoskeletal:  Full ROM all peripheral extremities, joint stability, 5/5 strength, and normal gait. Skin: Warm and dry without rashes, lesions, cyanosis, clubbing or  ecchymosis.  Neuro: Cranial nerves intact, reflexes equal bilaterally. Normal muscle tone, no cerebellar symptoms. Sensation intact.  Pysch: Alert and oriented X 3 with normal affect, insight and judgment appropriate.   Assessment and Plan  1. Annual Preventative/Screening Exam   2. Essential hypertension  - EKG 12-Lead - Urinalysis, Routine w reflex microscopic - Microalbumin / creatinine urine ratio - CBC with Differential/Platelet - COMPLETE  METABOLIC PANEL WITH GFR - Magnesium - TSH  3. Hyperlipidemia, mixed  - EKG 12-Lead - Lipid panel - TSH  4. Abnormal glucose  - EKG 12-Lead - Hemoglobin A1c - Insulin, random  5. Vitamin D deficiency  - VITAMIN D 25 Hydroxy   6. Aortic atherosclerosis (HCC) by Abd CT scan in 05/2015  - EKG 12-Lead - Lipid panel  7. Gastroesophageal reflux disease   - CBC with Differential/Platelet  8. Testosterone deficiency  - Testosterone  9. Chronic pain syndrome   10. Screening for colorectal cancer  - POC Hemoccult Bld/Stl   11. BPH with obstruction/lower urinary tract symptoms   12. Screening for ischemic heart disease  - EKG 12-Lead  13. Family history of hypertension  - EKG 12-Lead  14. Former smoker  - EKG 12-Lead  15. Medication management  - Urinalysis, Routine w reflex microscopic - Microalbumin / creatinine urine ratio - Testosterone - CBC with Differential/Platelet - COMPLETE METABOLIC PANEL WITH GFR - Magnesium - Lipid panel - TSH - Hemoglobin A1c - Insulin, random - VITAMIN D 25 Hydroxy           Patient was counseled in prudent diet, weight control to achieve/maintain BMI less than 25, BP monitoring, regular exercise and medications as discussed.  Discussed med effects and SE's. Routine screening labs and tests as requested with regular follow-up as recommended. Over 40 minutes of exam, counseling, chart review and high complex critical decision making was performed   Marinus Maw, MD

## 2023-09-03 NOTE — Patient Instructions (Signed)

## 2023-09-04 ENCOUNTER — Encounter: Payer: Self-pay | Admitting: Internal Medicine

## 2023-09-04 ENCOUNTER — Ambulatory Visit (INDEPENDENT_AMBULATORY_CARE_PROVIDER_SITE_OTHER): Payer: Medicare Other | Admitting: Internal Medicine

## 2023-09-04 VITALS — BP 132/86 | HR 64 | Temp 98.9°F | Ht 72.0 in | Wt 184.4 lb

## 2023-09-04 DIAGNOSIS — I700 Atherosclerosis of aorta: Secondary | ICD-10-CM | POA: Diagnosis not present

## 2023-09-04 DIAGNOSIS — E349 Endocrine disorder, unspecified: Secondary | ICD-10-CM

## 2023-09-04 DIAGNOSIS — I10 Essential (primary) hypertension: Secondary | ICD-10-CM

## 2023-09-04 DIAGNOSIS — K219 Gastro-esophageal reflux disease without esophagitis: Secondary | ICD-10-CM

## 2023-09-04 DIAGNOSIS — Z125 Encounter for screening for malignant neoplasm of prostate: Secondary | ICD-10-CM

## 2023-09-04 DIAGNOSIS — Z1211 Encounter for screening for malignant neoplasm of colon: Secondary | ICD-10-CM

## 2023-09-04 DIAGNOSIS — R7309 Other abnormal glucose: Secondary | ICD-10-CM

## 2023-09-04 DIAGNOSIS — E782 Mixed hyperlipidemia: Secondary | ICD-10-CM

## 2023-09-04 DIAGNOSIS — Z136 Encounter for screening for cardiovascular disorders: Secondary | ICD-10-CM

## 2023-09-04 DIAGNOSIS — Z8249 Family history of ischemic heart disease and other diseases of the circulatory system: Secondary | ICD-10-CM

## 2023-09-04 DIAGNOSIS — N401 Enlarged prostate with lower urinary tract symptoms: Secondary | ICD-10-CM

## 2023-09-04 DIAGNOSIS — Z0001 Encounter for general adult medical examination with abnormal findings: Secondary | ICD-10-CM

## 2023-09-04 DIAGNOSIS — E559 Vitamin D deficiency, unspecified: Secondary | ICD-10-CM

## 2023-09-04 DIAGNOSIS — Z79899 Other long term (current) drug therapy: Secondary | ICD-10-CM

## 2023-09-04 DIAGNOSIS — Z87891 Personal history of nicotine dependence: Secondary | ICD-10-CM

## 2023-09-04 DIAGNOSIS — Z0000 Encounter for general adult medical examination without abnormal findings: Secondary | ICD-10-CM | POA: Diagnosis not present

## 2023-09-04 DIAGNOSIS — G894 Chronic pain syndrome: Secondary | ICD-10-CM

## 2023-09-05 ENCOUNTER — Encounter: Payer: Medicare Other | Admitting: Internal Medicine

## 2023-09-05 LAB — COMPLETE METABOLIC PANEL WITH GFR
AG Ratio: 1.2 (calc) (ref 1.0–2.5)
ALT: 17 U/L (ref 9–46)
AST: 17 U/L (ref 10–35)
Albumin: 3.9 g/dL (ref 3.6–5.1)
Alkaline phosphatase (APISO): 73 U/L (ref 35–144)
BUN: 16 mg/dL (ref 7–25)
CO2: 29 mmol/L (ref 20–32)
Calcium: 9.6 mg/dL (ref 8.6–10.3)
Chloride: 103 mmol/L (ref 98–110)
Creat: 1.12 mg/dL (ref 0.70–1.28)
Globulin: 3.2 g/dL (ref 1.9–3.7)
Glucose, Bld: 94 mg/dL (ref 65–99)
Potassium: 4.3 mmol/L (ref 3.5–5.3)
Sodium: 139 mmol/L (ref 135–146)
Total Bilirubin: 0.4 mg/dL (ref 0.2–1.2)
Total Protein: 7.1 g/dL (ref 6.1–8.1)
eGFR: 70 mL/min/{1.73_m2} (ref >=60)

## 2023-09-05 LAB — CBC WITH DIFFERENTIAL/PLATELET
Absolute Lymphocytes: 1102 {cells}/uL (ref 850–3900)
Absolute Monocytes: 367 {cells}/uL (ref 200–950)
Basophils Absolute: 22 {cells}/uL (ref 0–200)
Basophils Relative: 0.6 %
Eosinophils Absolute: 90 {cells}/uL (ref 15–500)
Eosinophils Relative: 2.5 %
HCT: 47.4 % (ref 38.5–50.0)
Hemoglobin: 15.8 g/dL (ref 13.2–17.1)
MCH: 33 pg (ref 27.0–33.0)
MCHC: 33.3 g/dL (ref 32.0–36.0)
MCV: 99 fL (ref 80.0–100.0)
MPV: 10.4 fL (ref 7.5–12.5)
Monocytes Relative: 10.2 %
Neutro Abs: 2020 {cells}/uL (ref 1500–7800)
Neutrophils Relative %: 56.1 %
Platelets: 233 10*3/uL (ref 140–400)
RBC: 4.79 10*6/uL (ref 4.20–5.80)
RDW: 12.5 % (ref 11.0–15.0)
Total Lymphocyte: 30.6 %
WBC: 3.6 10*3/uL — ABNORMAL LOW (ref 3.8–10.8)

## 2023-09-05 LAB — MICROALBUMIN / CREATININE URINE RATIO
Creatinine, Urine: 114 mg/dL (ref 20–320)
Microalb Creat Ratio: 8 mg/g{creat} (ref <30)
Microalb, Ur: 0.9 mg/dL

## 2023-09-05 LAB — LIPID PANEL
Cholesterol: 139 mg/dL (ref <200)
HDL: 49 mg/dL (ref >=40)
LDL Cholesterol (Calc): 73 mg/dL
Non-HDL Cholesterol (Calc): 90 mg/dL (ref <130)
Total CHOL/HDL Ratio: 2.8 (calc) (ref <5.0)
Triglycerides: 90 mg/dL (ref <150)

## 2023-09-05 LAB — URINALYSIS, ROUTINE W REFLEX MICROSCOPIC
Bilirubin Urine: NEGATIVE
Glucose, UA: NEGATIVE
Hgb urine dipstick: NEGATIVE
Ketones, ur: NEGATIVE
Leukocytes,Ua: NEGATIVE
Nitrite: NEGATIVE
Protein, ur: NEGATIVE
Specific Gravity, Urine: 1.019 (ref 1.001–1.035)
pH: >=8.5 — AB (ref 5.0–8.0)

## 2023-09-05 LAB — VITAMIN D 25 HYDROXY (VIT D DEFICIENCY, FRACTURES): Vit D, 25-Hydroxy: 91 ng/mL (ref 30–100)

## 2023-09-05 LAB — HEMOGLOBIN A1C
Hgb A1c MFr Bld: 5.7 %{Hb} — ABNORMAL HIGH (ref <5.7)
Mean Plasma Glucose: 117 mg/dL
eAG (mmol/L): 6.5 mmol/L

## 2023-09-05 LAB — TESTOSTERONE: Testosterone: 1036 ng/dL — ABNORMAL HIGH (ref 250–827)

## 2023-09-05 LAB — INSULIN, RANDOM: Insulin: 14.9 u[IU]/mL

## 2023-09-05 LAB — TSH: TSH: 2.54 m[IU]/L (ref 0.40–4.50)

## 2023-09-05 LAB — MAGNESIUM: Magnesium: 2.3 mg/dL (ref 1.5–2.5)

## 2023-09-05 LAB — PSA: PSA: 1.06 ng/mL (ref <=4.00)

## 2023-09-06 NOTE — Progress Notes (Signed)
[] [] [] [] [] [] [] [] [] [] [] [] [] [] [] [] [] [] [] [] [] [] [] [] [] [] [] [] [] [] [] [] [] [] [] [] [] [] [] [] [] ][] [] [] [] [] [] [] [] [] [] [] [] [] [] [] [] [] [] [] [] [] [] [[] [] [] [] []   -Test results slightly outside the reference range are not unusual. If there is anything important, I will review this with you,  otherwise it is considered normal test values.  If you have further questions,  please do not hesitate to contact me at the office or via My Chart.    [] [] [] [] [] [] [] [] [] [] [] [] [] [] [] [] [] [] [] [] [] [] [] [] [] [] [] [] [] [] [] [] [] [] [] [] [] [] [] [] [] ][] [] [] [] [] [] [] [] [] [] [] [] [] [] [] [] [] [] [] [] [] [] [[] [] [] [] []   -  Testosterone level is too high, which increases risk for   blood clots, Heart Attacks, Strokes & Prostate cancer , So  Recommend that you decrease the dose from 1 ml to 1/2 ml  / week   [] [] [] [] [] [] [] [] [] [] [] [] [] [] [] [] [] [] [] [] [] [] [] [] [] [] [] [] [] [] [] [] [] [] [] [] [] [] [] [] [] ][] [] [] [] [] [] [] [] [] [] [] [] [] [] [] [] [] [] [] [] [] [] [[] [] [] [] []   -  A1c = 5.7  still  elevated in the borderline and                                                              early or pre-diabetes range which has the same   300% increased risk for heart attack, stroke, cancer and                                                alzheimer- type vascular dementia as full blown diabetes.   But the good news is that diet, exercise with                                                        weight loss can cure the early diabetes at this point.   Being diabetic has a  300% increased risk for heart attack,                                                    stroke, cancer, and alzheimer- type vascular dementia.   It is very important that you work harder with diet by                                   avoiding all foods that are white except chicken, fish & calliflower.  - Avoid white rice  (brown & wild rice is OK),   - Avoid white potatoes  (sweet potatoes in moderation is OK),   White bread or wheat bread or anything made out of   white flour like bagels, donuts, rolls,  buns, biscuits, cakes,  - pastries, cookies, pizza crust, and pasta (made from white flour & egg whites)   - vegetarian pasta or spinach or wheat pasta is OK.  - Multigrain breads like Arnold's, Pepperidge Farm or                                                          multigrain sandwich thins or high  fiber breads like   Eureka bread or "Dave's Killer" breads that are 4 to 5 grams fiber per slice !  are best.    Diet, exercise and weight loss can reverse and cure diabetes in the early stages.    - Diet, exercise and weight loss is very important in the                                        control and prevention of complications of diabetes which   affects every system in your body, ie.   -Brain - dementia/stroke,  - eyes - glaucoma/blindness,  - heart - heart attack/heart failure,  - kidneys - dialysis,  - stomach - gastric paralysis,  - intestines - malabsorption,  - nerves - severe painful neuritis,  - circulation - gangrene & loss of a leg(s)  - and finally  . . . . . . . . . . . . . . . . . .    - cancer and Alzheimers.  [] [] [] [] [] [] [] [] [] [] [] [] [] [] [] [] [] [] [] [] [] [] [] [] [] [] [] [] [] [] [] [] [] [] [] [] [] [] [] [] [] ][] [] [] [] [] [] [] [] [] [] [] [] [] [] [] [] [] [] [] [] [] [] [[] [] [] [] []   -  Chol = 139   Excellent   - Very low risk for Heart Attack  / Stroke  [] [] [] [] [] [] [] [] [] [] [] [] [] [] [] [] [] [] [] [] [] [] [] [] [] [] [] [] [] [] [] [] [] [] [] [] [] [] [] [] [] ][] [] [] [] [] [] [] [] [] [] [] [] [] [] [] [] [] [] [] [] [] [] [[] [] [] [] []   -  Vitamin D = 91  - Excellent - Please keep dosage same   [] [] [] [] [] [] [] [] [] [] [] [] [] [] [] [] [] [] [] [] [] [] [] [] [] [] [] [] [] [] [] [] [] [] [] [] [] [] [] [] [] ][] [] [] [] [] [] [] [] [] [] [] [] [] [] [] [] [] [] [] [] [] [] [[] [] [] [] []   -  PSA - Low  - No Prostate Cancer  -    Great  !    [] [] [] [] [] [] [] [] [] [] [] [] [] [] [] [] [] [] [] [] [] [] [] [] [] [] [] [] [] [] [] [] [] [] [] [] [] [] [] [] [] ][] [] [] [] [] [] [] [] [] [] [] [] [] [] [] [] [] [] [] [] [] [] [[] [] [] [] []   -  All Else - CBC - Kidneys - Electrolytes - Liver - Magnesium & Thyroid    - all  Normal /  OK  [] [] [] [] [] [] [] [] [] [] [] [] [] [] [] [] [] [] [] [] [] [] [] [] [] [] [] [] [] [] [] [] [] [] [] [] [] [] [] [] [] ][] [] [] [] [] [] [] [] [] [] [] [] [] [] [] [] [] [] [] [] [] [] [[] [] [] [] []

## 2023-11-17 ENCOUNTER — Other Ambulatory Visit: Payer: Self-pay

## 2023-11-17 MED ORDER — DICLOFENAC SODIUM 75 MG PO TBEC: DELAYED_RELEASE_TABLET | ORAL | 0 refills | Status: DC

## 2023-12-07 ENCOUNTER — Ambulatory Visit: Payer: Medicare Other | Admitting: Nurse Practitioner

## 2023-12-12 ENCOUNTER — Ambulatory Visit (HOSPITAL_BASED_OUTPATIENT_CLINIC_OR_DEPARTMENT_OTHER): Payer: Medicare Other | Admitting: Family Medicine

## 2023-12-12 ENCOUNTER — Encounter (HOSPITAL_BASED_OUTPATIENT_CLINIC_OR_DEPARTMENT_OTHER): Payer: Self-pay | Admitting: Family Medicine

## 2023-12-12 VITALS — BP 121/73 | HR 72 | Ht 73.5 in | Wt 186.4 lb

## 2023-12-12 DIAGNOSIS — I10 Essential (primary) hypertension: Secondary | ICD-10-CM

## 2023-12-12 DIAGNOSIS — E349 Endocrine disorder, unspecified: Secondary | ICD-10-CM | POA: Diagnosis not present

## 2023-12-12 DIAGNOSIS — S39012A Strain of muscle, fascia and tendon of lower back, initial encounter: Secondary | ICD-10-CM | POA: Insufficient documentation

## 2023-12-12 DIAGNOSIS — E782 Mixed hyperlipidemia: Secondary | ICD-10-CM

## 2023-12-12 DIAGNOSIS — R7303 Prediabetes: Secondary | ICD-10-CM | POA: Insufficient documentation

## 2023-12-12 NOTE — Assessment & Plan Note (Signed)
 Can continue with medication regimen at this time, no changes made today.  Also recommend lifestyle modifications.

## 2023-12-12 NOTE — Patient Instructions (Signed)
  Medication Instructions:  Your physician recommends that you continue on your current medications as directed. Please refer to the Current Medication list given to you today. --If you need a refill on any your medications before your next appointment, please call your pharmacy first. If no refills are authorized on file call the office.-- Lab Work: Your physician has recommended that you have lab work today: labs Monday / labs 1 week before next visit  If you have labs (blood work) drawn today and your tests are completely normal, you will receive your results via MyChart message OR a phone call from our staff.  Please ensure you check your voicemail in the event that you authorized detailed messages to be left on a delegated number. If you have any lab test that is abnormal or we need to change your treatment, we will call you to review the results.  Follow-Up: Your next appointment:   Your physician recommends that you schedule a follow-up appointment in: 4 months physical  with Dr. de [place]  You will receive a text message or e-mail with a link to a survey about your care and experience with [place] today! We would greatly appreciate your feedback!   Thanks for letting us be apart of your health journey!!  Primary Care and Sports Medicine   Dr. Ceasar Mons [place]   We encourage you to activate your patient portal called "MyChart".  Sign up information is provided on this After Visit Summary.  MyChart is used to connect with patients for Virtual Visits (Telemedicine).  Patients are able to view lab/test results, encounter notes, upcoming appointments, etc.  Non-urgent messages can be sent to your provider as well. To learn more about what you can do with MyChart, please visit --  [email or web address].

## 2023-12-12 NOTE — Assessment & Plan Note (Signed)
 Blood pressure normal in office today.  Given reportedly normal readings at home as well, would be reasonable to continue without olmesartan and monitor blood pressure closely.  Discussed lifestyle modifications including DASH diet.  If noticing any issues with blood pressure, can return to office to discuss management considerations.

## 2023-12-12 NOTE — Assessment & Plan Note (Signed)
 Noted on prior labs, primarily managing lifestyle modifications.  No concerns today related to symptoms suggesting overt diabetes

## 2023-12-12 NOTE — Assessment & Plan Note (Signed)
 Testosterone last administered a couple days ago.  Does seem somewhat nonspecific on days of the week that he will administer medication, however seems that it primarily will be on Sundays.  Given this, we will plan to check testosterone level on Monday in between administrations.  He will return to have this completed next week.  We will manage medication according to lab results

## 2023-12-12 NOTE — Progress Notes (Signed)
 New Patient Office Visit  Subjective   Patient ID: Jacob Larsen, male    DOB: 12/13/1950  Age: 73 y.o. MRN: [id number]  CC:  Chief Complaint  Patient presents with   New Patient (Initial Visit)    New patient dr Vaughan Basta pt just establishing care     HPI Jacob Larsen presents to establish care Last PCP - Dr. Oneta Rack  Hypertension: Previously was taking olmesartan, however began to note some lightheadedness and dizziness with low blood pressure readings at home.  He initially reduced olmesartan dose in half, but continued to have symptoms.  More recently, he did completely stop medication has not had any further issues with lightheadedness or dizziness and blood pressure has been normal at home.  Hyperlipidemia: Currently taking atorvastatin, denies any issues with medication. Testosterone deficiency: Currently administering 100 mg every other week.  Did have dose changed following recent labs in December.  At that time testosterone was slightly elevated.  He denies experiencing any adverse effects or issues from medication.  Patient is originally from [facility], has lived here since 1971. Was working at [employer], worked there for about 41 years. He enjoys doing yard work, taking boat out fishing, playing cards. Cares for wife who has dementia.  Outpatient Encounter Medications as of 12/12/2023  Medication Sig   atorvastatin (LIPITOR) 40 MG tablet TAKE 1 TABLET 3 DAYS A WEEK FOR CHOLESTEROL.   diclofenac (VOLTAREN) 75 MG EC tablet TAKE 1 TABLET TWICE A DAY WITH FOOD AS NEEDED FOR PAIN & INFLAMMATION. TRY TO LIMIT TO 5 DAYS A WEEK TO PROTECT KIDNEYS   Docusate Calcium (STOOL SOFTENER PO) Take by mouth. Takes 3 Tabs in the morning.   fexofenadine (ALLEGRA) 180 MG tablet Take 1 tablet (180 mg total) by mouth daily.   Flaxseed, Linseed, (FLAX SEED OIL) 1000 MG CAPS Take 1 capsule (1,000 mg total) by mouth 2 (two) times daily.   fluticasone (FLONASE) 50 MCG/ACT nasal spray  PLACE 1-2 SPRAYS INTO BOTH NOSTRILS DAILY.   Magnesium 250 MG TABS Take 250 mg by mouth daily.   Needles & Syringes MISC Use for Testosterone injection  every  week   omeprazole (PRILOSEC) 20 MG capsule Take  1 capsule  2 x / day  (every 12 hours)  to Prevent Indigestion & Heartburn                                   /                                                                   TAKE                                         BY                                                 MOUTH   oxyCODONE (OXY IR/ROXICODONE) 5 MG immediate release  tablet Take 5 mg by mouth every 4 (four) hours as needed for severe pain.   tamsulosin (FLOMAX) 0.4 MG CAPS capsule Take 0.4 mg by mouth daily.   testosterone cypionate (DEPOTESTOSTERONE CYPIONATE) 200 MG/ML injection INJECT 1 ML INTO MUSCLE EVERY 7 DAYS   vitamin B-12 (CYANOCOBALAMIN) 1000 MCG tablet Take 1,000 mcg by mouth daily.   VITAMIN D PO Take 5,000 Units by mouth daily.   olmesartan (BENICAR) 40 MG tablet Take  1 tablet  Daily for BP                                                    /                                             TAKE                                                             BY                                                 MOUTH (Patient not taking: Reported on 12/12/2023)   sildenafil (VIAGRA) 100 MG tablet Take 1 tablet (100 mg total) by mouth as needed for erectile dysfunction.   No facility-administered encounter medications on file as of 12/12/2023.    Past Medical History:  Diagnosis Date   Allergy    Anxiety    takes Valium daily as needed   Arthritis    in neck   BPH (benign prostatic hyperplasia)    takes Flomax daily   Brachial radiculitis 08/14/2013   Chronic back pain    DDD   COPD (chronic obstructive pulmonary disease) (HCC)    Diverticulosis    Foot drop    both feet   GERD (gastroesophageal reflux disease)    takes Omeprazole daily as well as Zantac   Hyperlipidemia    takes Atorvastatin daily   Hypertension     Hypogonadism male    Nocturia    Numbness    only in 4th and 5th finger on left hand and left leg   Prediabetes 12/26/2018   Reflux esophagitis 08/13/2013   Seasonal allergies    takes Claritin daily as needed and uses Flonase daily as needed   Urinary frequency    takes Flomax daily   Wears hearing aid 10/10/2019    Past Surgical History:  Procedure Laterality Date   BACK SURGERY  2016 x 2 L3-L5, 12/2016 S1   lumbar fusion x 3   CERVICAL FUSION  2011/2012   COLONOSCOPY     KNEE ARTHROSCOPY     right 1985; left 1992   PROSTATE SURGERY  10/07/2019   Rezium water vapor procedure by Dr. Alvester Morin   SEPTOPLASTY     SHOULDER ARTHROSCOPY Left 2015   SHOULDER ARTHROSCOPY WITH ROTATOR CUFF REPAIR Right 08/18/2017   Dr. Rennis Chris  SHOULDER SURGERY Right 08   SPINAL CORD STIMULATOR IMPLANT  08/21/2020   Dr. Janey Genta    Family History  Problem Relation Age of Onset   Leukemia Mother    Diabetes Mother    Hypertension Father    Diabetes Father    Colon cancer Neg Hx    Stomach cancer Neg Hx    Esophageal cancer Neg Hx    Rectal cancer Neg Hx     Social History   Socioeconomic History   Marital status: Married    Spouse name: Not on file   Number of children: Not on file   Years of education: Not on file   Highest education level: Not on file  Occupational History   Not on file  Tobacco Use   Smoking status: Former    Current packs/day: 0.00    Types: Cigarettes    Quit date: 05/28/1995    Years since quitting: 28.5    Passive exposure: Past   Smokeless tobacco: Never   Tobacco comments:    quit smoking >20+yrs ago  Vaping Use   Vaping status: Never Used  Substance and Sexual Activity   Alcohol use: Not Currently    Alcohol/week: 0.0 standard drinks of alcohol    Comment: rarely   Drug use: No   Sexual activity: Yes  Other Topics Concern   Not on file  Social History Narrative   Not on file   Social Drivers of Health   Financial Resource Strain: Not on  file  Food Insecurity: Not on file  Transportation Needs: Not on file  Physical Activity: Not on file  Stress: Not on file  Social Connections: Not on file  Intimate Partner Violence: Not on file    Objective   BP 121/73 (BP Location: Right Arm, Patient Position: Sitting, Cuff Size: Normal)   Pulse 72   Ht 6' 1.5" (1.867 m)   Wt 186 lb 6.4 oz (84.6 kg)   SpO2 96%   BMI 24.26 kg/m   Physical Exam  73 year old male in no acute distress Cardiovascular Sam with regular rate and rhythm Lungs clear to auscultation bilaterally  Assessment & Plan:   Testosterone deficiency Assessment & Plan: Testosterone last administered a couple days ago.  Does seem somewhat nonspecific on days of the week that he will administer medication, however seems that it primarily will be on Sundays.  Given this, we will plan to check testosterone level on Monday in between administrations.  He will return to have this completed next week.  We will manage medication according to lab results  Orders: -     Testosterone; Future  Hyperlipidemia, mixed Assessment & Plan: Can continue with medication regimen at this time, no changes made today.  Also recommend lifestyle modifications.   Essential hypertension Assessment & Plan: Blood pressure normal in office today.  Given reportedly normal readings at home as well, would be reasonable to continue without olmesartan and monitor blood pressure closely.  Discussed lifestyle modifications including DASH diet.  If noticing any issues with blood pressure, can return to office to discuss management considerations.   Prediabetes Assessment & Plan: Noted on prior labs, primarily managing lifestyle modifications.  No concerns today related to symptoms suggesting overt diabetes   Return in about 4 months (around 04/12/2024) for hypertension, prediabetes, hyperlipidemia.   Spent 47 minutes on this patient encounter, including preparation, chart review,  face-to-face counseling with patient and coordination of care, and documentation of encounter   ___________________________________________ Ceasar Mons  [place], MD, ABFM, [facility]

## 2023-12-18 ENCOUNTER — Ambulatory Visit (HOSPITAL_BASED_OUTPATIENT_CLINIC_OR_DEPARTMENT_OTHER): Admitting: *Deleted

## 2023-12-18 ENCOUNTER — Other Ambulatory Visit (HOSPITAL_BASED_OUTPATIENT_CLINIC_OR_DEPARTMENT_OTHER): Payer: Self-pay | Admitting: *Deleted

## 2023-12-18 ENCOUNTER — Encounter (HOSPITAL_BASED_OUTPATIENT_CLINIC_OR_DEPARTMENT_OTHER): Payer: Self-pay

## 2023-12-18 VITALS — BP 113/73 | HR 65 | Ht 73.5 in | Wt 188.8 lb

## 2023-12-18 DIAGNOSIS — E349 Endocrine disorder, unspecified: Secondary | ICD-10-CM

## 2023-12-18 DIAGNOSIS — Z0000 Encounter for general adult medical examination without abnormal findings: Secondary | ICD-10-CM

## 2023-12-18 NOTE — Patient Instructions (Signed)
 Jacob Larsen , Thank you for taking time to come for your Medicare Wellness Visit. I appreciate your ongoing commitment to your health goals. Please review the following plan we discussed and let me know if I can assist you in the future.   Screening recommendations/referrals: Colonoscopy: 2030 Recommended yearly ophthalmology/optometry visit for glaucoma screening and checkup Recommended yearly dental visit for hygiene and checkup  Vaccinations: Influenza vaccine: Completed Pneumococcal vaccine: Completed Tdap vaccine: Completed Shingles vaccine: Completed    Advanced directives: copy requested  Conditions/risks identified: Falls  Next appointment: 1 year   Preventive Care 74 Years and Older, Male Preventive care refers to lifestyle choices and visits with your health care provider that can promote health and wellness. What does preventive care include? A yearly physical exam. This is also called an annual well check. Dental exams once or twice a year. Routine eye exams. Ask your health care provider how often you should have your eyes checked. Personal lifestyle choices, including: Daily care of your teeth and gums. Regular physical activity. Eating a healthy diet. Avoiding tobacco and drug use. Limiting alcohol use. Practicing safe sex. Taking low doses of aspirin every day. Taking vitamin and mineral supplements as recommended by your health care provider. What happens during an annual well check? The services and screenings done by your health care provider during your annual well check will depend on your age, overall health, lifestyle risk factors, and family history of disease. Counseling  Your health care provider may ask you questions about your: Alcohol use. Tobacco use. Drug use. Emotional well-being. Home and relationship well-being. Sexual activity. Eating habits. History of falls. Memory and ability to understand (cognition). Work and work  Astronomer. Screening  You may have the following tests or measurements: Height, weight, and BMI. Blood pressure. Lipid and cholesterol levels. These may be checked every 5 years, or more frequently if you are over 74 years old. Skin check. Lung cancer screening. You may have this screening every year starting at age 52 if you have a 30-pack-year history of smoking and currently smoke or have quit within the past 15 years. Fecal occult blood test (FOBT) of the stool. You may have this test every year starting at age 46. Flexible sigmoidoscopy or colonoscopy. You may have a sigmoidoscopy every 5 years or a colonoscopy every 10 years starting at age 88. Prostate cancer screening. Recommendations will vary depending on your family history and other risks. Hepatitis C blood test. Hepatitis B blood test. Sexually transmitted disease (STD) testing. Diabetes screening. This is done by checking your blood sugar (glucose) after you have not eaten for a while (fasting). You may have this done every 1-3 years. Abdominal aortic aneurysm (AAA) screening. You may need this if you are a current or former smoker. Osteoporosis. You may be screened starting at age 20 if you are at high risk. Talk with your health care provider about your test results, treatment options, and if necessary, the need for more tests. Vaccines  Your health care provider may recommend certain vaccines, such as: Influenza vaccine. This is recommended every year. Tetanus, diphtheria, and acellular pertussis (Tdap, Td) vaccine. You may need a Td booster every 10 years. Zoster vaccine. You may need this after age 24. Pneumococcal 13-valent conjugate (PCV13) vaccine. One dose is recommended after age 13. Pneumococcal polysaccharide (PPSV23) vaccine. One dose is recommended after age 8. Talk to your health care provider about which screenings and vaccines you need and how often you need them. This information  is not intended to replace  advice given to you by your health care provider. Make sure you discuss any questions you have with your health care provider. Document Released: 09/25/2015 Document Revised: 05/18/2016 Document Reviewed: 06/30/2015 Elsevier Interactive Patient Education  2017 [employer].  Fall Prevention in the Home Falls can cause injuries. They can happen to people of all ages. There are many things you can do to make your home safe and to help prevent falls. What can I do on the outside of my home? Regularly fix the edges of walkways and driveways and fix any cracks. Remove anything that might make you trip as you walk through a door, such as a raised step or threshold. Trim any bushes or trees on the path to your home. Use bright outdoor lighting. Clear any walking paths of anything that might make someone trip, such as rocks or tools. Regularly check to see if handrails are loose or broken. Make sure that both sides of any steps have handrails. Any raised decks and porches should have guardrails on the edges. Have any leaves, snow, or ice cleared regularly. Use sand or salt on walking paths during winter. Clean up any spills in your garage right away. This includes oil or grease spills. What can I do in the bathroom? Use night lights. Install grab bars by the toilet and in the tub and shower. Do not use towel bars as grab bars. Use non-skid mats or decals in the tub or shower. If you need to sit down in the shower, use a plastic, non-slip stool. Keep the floor dry. Clean up any water that spills on the floor as soon as it happens. Remove soap buildup in the tub or shower regularly. Attach bath mats securely with double-sided non-slip rug tape. Do not have throw rugs and other things on the floor that can make you trip. What can I do in the bedroom? Use night lights. Make sure that you have a light by your bed that is easy to reach. Do not use any sheets or blankets that are too big for your bed.  They should not hang down onto the floor. Have a firm chair that has side arms. You can use this for support while you get dressed. Do not have throw rugs and other things on the floor that can make you trip. What can I do in the kitchen? Clean up any spills right away. Avoid walking on wet floors. Keep items that you use a lot in easy-to-reach places. If you need to reach something above you, use a strong step stool that has a grab bar. Keep electrical cords out of the way. Do not use floor polish or wax that makes floors slippery. If you must use wax, use non-skid floor wax. Do not have throw rugs and other things on the floor that can make you trip. What can I do with my stairs? Do not leave any items on the stairs. Make sure that there are handrails on both sides of the stairs and use them. Fix handrails that are broken or loose. Make sure that handrails are as long as the stairways. Check any carpeting to make sure that it is firmly attached to the stairs. Fix any carpet that is loose or worn. Avoid having throw rugs at the top or bottom of the stairs. If you do have throw rugs, attach them to the floor with carpet tape. Make sure that you have a light switch at the top of  the stairs and the bottom of the stairs. If you do not have them, ask someone to add them for you. What else can I do to help prevent falls? Wear shoes that: Do not have high heels. Have rubber bottoms. Are comfortable and fit you well. Are closed at the toe. Do not wear sandals. If you use a stepladder: Make sure that it is fully opened. Do not climb a closed stepladder. Make sure that both sides of the stepladder are locked into place. Ask someone to hold it for you, if possible. Clearly mark and make sure that you can see: Any grab bars or handrails. First and last steps. Where the edge of each step is. Use tools that help you move around (mobility aids) if they are needed. These  include: Canes. Walkers. Scooters. Crutches. Turn on the lights when you go into a dark area. Replace any light bulbs as soon as they burn out. Set up your furniture so you have a clear path. Avoid moving your furniture around. If any of your floors are uneven, fix them. If there are any pets around you, be aware of where they are. Review your medicines with your doctor. Some medicines can make you feel dizzy. This can increase your chance of falling. Ask your doctor what other things that you can do to help prevent falls. This information is not intended to replace advice given to you by your health care provider. Make sure you discuss any questions you have with your health care provider. Document Released: 06/25/2009 Document Revised: 02/04/2016 Document Reviewed: 10/03/2014 Elsevier Interactive Patient Education  2017 [employer].

## 2023-12-18 NOTE — Progress Notes (Signed)
 Subjective:   Jacob DISSINGER is a 73 y.o. male who presents for Medicare Annual/Subsequent preventive examination.  Visit Complete: In person  Patient Medicare AWV questionnaire was completed by the patient on 12/18/23; I have confirmed that all information answered by patient is correct and no changes since this date.        Objective:    There were no vitals filed for this visit. There is no height or weight on file to calculate BMI.     12/13/2022   11:28 AM 11/29/2021    2:57 PM 11/12/2020   11:55 AM 10/10/2019   11:37 AM 10/08/2018    7:35 AM 08/21/2018    9:21 AM 01/24/2018   10:52 AM  Advanced Directives  Does Patient Have a Medical Advance Directive? Yes Yes Yes Yes Yes Yes Yes  Type of Estate agent of [place];Living will Healthcare Power of [place];Living will Healthcare Power of [place];Living will Healthcare Power of [place];Living will Healthcare Power of [place];Living will Healthcare Power of [place];Living will Healthcare Power of [place];Living will  Does patient want to make changes to medical advance directive? No - Patient declined No - Patient declined No - Patient declined No - Patient declined  No - Patient declined   Copy of Healthcare Power of Attorney in Chart? No - copy requested No - copy requested No - copy requested No - copy requested  No - copy requested No - copy requested    Current Medications (verified) Outpatient Encounter Medications as of 12/18/2023  Medication Sig   atorvastatin (LIPITOR) 40 MG tablet TAKE 1 TABLET 3 DAYS A WEEK FOR CHOLESTEROL.   diclofenac (VOLTAREN) 75 MG EC tablet TAKE 1 TABLET TWICE A DAY WITH FOOD AS NEEDED FOR PAIN & INFLAMMATION. TRY TO LIMIT TO 5 DAYS A WEEK TO PROTECT KIDNEYS   Docusate Calcium (STOOL SOFTENER PO) Take by mouth. Takes 3 Tabs in the morning.   fexofenadine (ALLEGRA) 180 MG tablet Take 1 tablet (180 mg total) by mouth daily.   Flaxseed, Linseed, (FLAX SEED OIL) 1000 MG CAPS Take  1 capsule (1,000 mg total) by mouth 2 (two) times daily.   fluticasone (FLONASE) 50 MCG/ACT nasal spray PLACE 1-2 SPRAYS INTO BOTH NOSTRILS DAILY.   Magnesium 250 MG TABS Take 250 mg by mouth daily.   Needles & Syringes MISC Use for Testosterone injection  every  week   olmesartan (BENICAR) 40 MG tablet Take  1 tablet  Daily for BP                                                    /                                             TAKE                                                             BY  MOUTH (Patient not taking: Reported on 12/12/2023)   omeprazole (PRILOSEC) 20 MG capsule Take  1 capsule  2 x / day  (every 12 hours)  to Prevent Indigestion & Heartburn                                   /                                                                   TAKE                                         BY                                                 MOUTH   oxyCODONE (OXY IR/ROXICODONE) 5 MG immediate release tablet Take 5 mg by mouth every 4 (four) hours as needed for severe pain.   sildenafil (VIAGRA) 100 MG tablet Take 1 tablet (100 mg total) by mouth as needed for erectile dysfunction.   tamsulosin (FLOMAX) 0.4 MG CAPS capsule Take 0.4 mg by mouth daily.   testosterone cypionate (DEPOTESTOSTERONE CYPIONATE) 200 MG/ML injection INJECT 1 ML INTO MUSCLE EVERY 7 DAYS   vitamin B-12 (CYANOCOBALAMIN) 1000 MCG tablet Take 1,000 mcg by mouth daily.   VITAMIN D PO Take 5,000 Units by mouth daily.   No facility-administered encounter medications on file as of 12/18/2023.    Allergies (verified) Other, Aspirin, and Salicylates   History: Past Medical History:  Diagnosis Date   Allergy    Anxiety    takes Valium daily as needed   Arthritis    in neck   BPH (benign prostatic hyperplasia)    takes Flomax daily   Brachial radiculitis 08/14/2013   Chronic back pain    DDD   COPD (chronic obstructive pulmonary disease) (HCC)    Diverticulosis     Foot drop    both feet   GERD (gastroesophageal reflux disease)    takes Omeprazole daily as well as Zantac   Hyperlipidemia    takes Atorvastatin daily   Hypertension    Hypogonadism male    Nocturia    Numbness    only in 4th and 5th finger on left hand and left leg   Prediabetes 12/26/2018   Reflux esophagitis 08/13/2013   Seasonal allergies    takes Claritin daily as needed and uses Flonase daily as needed   Urinary frequency    takes Flomax daily   Wears hearing aid 10/10/2019   Past Surgical History:  Procedure Laterality Date   BACK SURGERY  2016 x 2 L3-L5, 12/2016 S1   lumbar fusion x 3   CERVICAL FUSION  2011/2012   COLONOSCOPY     KNEE ARTHROSCOPY     right 1985; left 1992   PROSTATE SURGERY  10/07/2019   Rezium water vapor procedure by Dr. Alvester Morin   SEPTOPLASTY     SHOULDER ARTHROSCOPY Left 2015  SHOULDER ARTHROSCOPY WITH ROTATOR CUFF REPAIR Right 08/18/2017   Dr. Rennis Chris   SHOULDER SURGERY Right 08   SPINAL CORD STIMULATOR IMPLANT  08/21/2020   Dr. Janey Genta   Family History  Problem Relation Age of Onset   Leukemia Mother    Diabetes Mother    Hypertension Father    Diabetes Father    Colon cancer Neg Hx    Stomach cancer Neg Hx    Esophageal cancer Neg Hx    Rectal cancer Neg Hx    Social History   Socioeconomic History   Marital status: Married    Spouse name: Not on file   Number of children: Not on file   Years of education: Not on file   Highest education level: Not on file  Occupational History   Not on file  Tobacco Use   Smoking status: Former    Current packs/day: 0.00    Types: Cigarettes    Quit date: 05/28/1995    Years since quitting: 28.5    Passive exposure: Past   Smokeless tobacco: Never   Tobacco comments:    quit smoking >20+yrs ago  Vaping Use   Vaping status: Never Used  Substance and Sexual Activity   Alcohol use: Not Currently    Alcohol/week: 0.0 standard drinks of alcohol    Comment: rarely   Drug use: No    Sexual activity: Yes  Other Topics Concern   Not on file  Social History Narrative   Not on file   Social Drivers of Health   Financial Resource Strain: Not on file  Food Insecurity: Not on file  Transportation Needs: Not on file  Physical Activity: Not on file  Stress: Not on file  Social Connections: Not on file    Tobacco Counseling Counseling given: Not Answered Tobacco comments: quit smoking >20+yrs ago   Clinical Intake:                        Activities of Daily Living    09/03/2023    9:02 PM 03/21/2023   12:18 AM  In your present state of health, do you have any difficulty performing the following activities:  Hearing? 0 0  Vision? 0 0  Difficulty concentrating or making decisions? 0 0  Walking or climbing stairs? 0 0  Dressing or bathing? 0 0  Doing errands, shopping? 0 0    Patient Care Team: [place], Buren Kos, MD as PCP - General (Family Medicine) Louis Meckel, MD (Inactive) as Consulting Physician (Gastroenterology) Ihor Gully, MD (Inactive) as Consulting Physician (Urology) Antonietta Barcelona, OD as Referring Physician (Optometry) Cherlyn Roberts, MD as Consulting Physician (Dermatology) Julio Sicks, MD as Consulting Physician (Neurosurgery)  Indicate any recent Medical Services you may have received from other than [facility] providers in the past year (date may be approximate).     Assessment:   This is a routine wellness examination for Jacob Larsen.  Hearing/Vision screen No results found.   Goals Addressed   None   Depression Screen    12/12/2023    1:10 PM 09/03/2023    9:02 PM 03/21/2023   12:19 AM 03/21/2023   12:18 AM 12/13/2022   11:29 AM 08/29/2022    8:19 PM 03/02/2022   10:28 PM  PHQ 2/9 Scores  PHQ - 2 Score 0 0 0 0 0 0 0  PHQ- 9 Score 0          Fall Risk  12/12/2023    1:09 PM 09/03/2023    9:02 PM 03/21/2023   12:18 AM 12/13/2022   11:29 AM 08/29/2022    8:19 PM  Fall Risk   Falls in the past year? 0 0 0 0  0  Number falls in past yr: 0   0   Injury with Fall? 0   0   Risk for fall due to : No Fall Risks No Fall Risks No Fall Risks Orthopedic patient No Fall Risks  Follow up Falls evaluation completed Falls prevention discussed;Education provided;Falls evaluation completed Falls prevention discussed;Education provided;Falls evaluation completed Falls evaluation completed;Falls prevention discussed Falls prevention discussed;Education provided;Falls evaluation completed    MEDICARE RISK AT HOME:    TIMED UP AND GO:  Was the test performed?  Yes  Length of time to ambulate 10 feet: 20 sec Gait steady and fast without use of assistive device    Cognitive Function:        Immunizations Immunization History  Administered Date(s) Administered   Influenza Split 06/18/2015   Influenza, High Dose Seasonal PF 06/20/2016, 06/06/2017, 07/31/2018, 08/18/2020, 07/06/2021, 06/26/2023   Influenza-Unspecified 08/17/2014, 07/10/2019   PFIZER(Purple Top)SARS-COV-2 Vaccination 10/18/2019, 11/08/2019, 06/26/2020   PPD Test 05/27/2014   Pfizer Covid-19 Vaccine Bivalent Booster 35yrs & up 08/04/2021   Pneumococcal Conjugate-13 12/09/2015   Pneumococcal Polysaccharide-23 04/08/2013, 06/06/2017   Td 09/13/2003   Tdap 05/27/2014   Zoster Recombinant(Shingrix) 11/29/2017, 02/08/2018    TDAP status: Up to date  Flu Vaccine status: Up to date  Pneumococcal vaccine status: Up to date  Covid-19 vaccine status: Completed vaccines  Qualifies for Shingles Vaccine? Yes   Zostavax completed Yes   Shingrix Completed?: Yes  Screening Tests Health Maintenance  Topic Date Due   COVID-19 Vaccine (5 - 2024-25 season) 05/14/2023   INFLUENZA VACCINE  04/12/2024   DTaP/Tdap/Td (3 - Td or Tdap) 05/27/2024   Medicare Annual Wellness (AWV)  12/17/2024   Colonoscopy  10/08/2028   Pneumonia Vaccine 15+ Years old  Completed   Hepatitis C Screening  Completed   Zoster Vaccines- Shingrix  Completed   HPV  VACCINES  Aged Out    Health Maintenance  Health Maintenance Due  Topic Date Due   COVID-19 Vaccine (5 - 2024-25 season) 05/14/2023    Colorectal cancer screening: Type of screening: Colonoscopy. Completed 10/08/18. Repeat every 10 years  Lung Cancer Screening: (Low Dose CT Chest recommended if Age 56-80 years, 20 pack-year currently smoking OR have quit w/in 15years.) does not qualify.   Lung Cancer Screening Referral: NS  Additional Screening:  Hepatitis C Screening: does qualify; Completed 05/13/15  Vision Screening: Recommended annual ophthalmology exams for early detection of glaucoma and other disorders of the eye. Is the patient up to date with their annual eye exam?  No  Who is the provider or what is the name of the office in which the patient attends annual eye exams? Dr Hyacinth Meeker  If pt is not established with a provider, would they like to be referred to a provider to establish care? No .   Dental Screening: Recommended annual dental exams for proper oral hygiene   Community Resource Referral / Chronic Care Management: CRR required this visit?  No   CCM required this visit?  No     Plan:     I have personally reviewed and noted the following in the patient's chart:   Medical and social history Use of alcohol, tobacco or illicit drugs  Current medications and supplements including opioid  prescriptions. Patient is not currently taking opioid prescriptions. Functional ability and status Nutritional status Physical activity Advanced directives List of other physicians Hospitalizations, surgeries, and ER visits in previous 12 months Vitals Screenings to include cognitive, depression, and falls Referrals and appointments  In addition, I have reviewed and discussed with patient certain preventive protocols, quality metrics, and best practice recommendations. A written personalized care plan for preventive services as well as general preventive health  recommendations were provided to patient.     Park Breed, CMA   12/18/2023   After Visit Summary: (In Person-Printed) AVS printed and given to the patient  Nurse Notes:  Mr. Cazier , Thank you for taking time to come for your Medicare Wellness Visit. I appreciate your ongoing commitment to your health goals. Please review the following plan we discussed and let me know if I can assist you in the future.   These are the goals we discussed:  Goals      Exercise 150 min/wk Moderate Activity     Start slow and increase gradually as tolerated;         This is a list of the screening recommended for you and due dates:  Health Maintenance  Topic Date Due   COVID-19 Vaccine (5 - 2024-25 season) 05/14/2023   Flu Shot  04/12/2024   DTaP/Tdap/Td vaccine (3 - Td or Tdap) 05/27/2024   Medicare Annual Wellness Visit  12/17/2024   Colon Cancer Screening  10/08/2028   Pneumonia Vaccine  Completed   Hepatitis C Screening  Completed   Zoster (Shingles) Vaccine  Completed   HPV Vaccine  Aged Out

## 2023-12-19 LAB — TESTOSTERONE: Testosterone: 640 ng/dL (ref 264–916)

## 2023-12-21 ENCOUNTER — Other Ambulatory Visit (HOSPITAL_BASED_OUTPATIENT_CLINIC_OR_DEPARTMENT_OTHER): Payer: Self-pay | Admitting: Family Medicine

## 2023-12-21 ENCOUNTER — Encounter (HOSPITAL_BASED_OUTPATIENT_CLINIC_OR_DEPARTMENT_OTHER): Payer: Self-pay | Admitting: Family Medicine

## 2023-12-21 DIAGNOSIS — Z0000 Encounter for general adult medical examination without abnormal findings: Secondary | ICD-10-CM

## 2023-12-21 DIAGNOSIS — I10 Essential (primary) hypertension: Secondary | ICD-10-CM

## 2023-12-21 DIAGNOSIS — E559 Vitamin D deficiency, unspecified: Secondary | ICD-10-CM

## 2023-12-21 DIAGNOSIS — Z125 Encounter for screening for malignant neoplasm of prostate: Secondary | ICD-10-CM

## 2023-12-21 DIAGNOSIS — E349 Endocrine disorder, unspecified: Secondary | ICD-10-CM

## 2023-12-21 DIAGNOSIS — E782 Mixed hyperlipidemia: Secondary | ICD-10-CM

## 2023-12-21 DIAGNOSIS — R7303 Prediabetes: Secondary | ICD-10-CM

## 2023-12-25 ENCOUNTER — Encounter (HOSPITAL_BASED_OUTPATIENT_CLINIC_OR_DEPARTMENT_OTHER)

## 2024-01-15 ENCOUNTER — Ambulatory Visit: Payer: Self-pay

## 2024-01-15 ENCOUNTER — Other Ambulatory Visit (HOSPITAL_BASED_OUTPATIENT_CLINIC_OR_DEPARTMENT_OTHER): Payer: Self-pay | Admitting: *Deleted

## 2024-01-15 DIAGNOSIS — J302 Other seasonal allergic rhinitis: Secondary | ICD-10-CM

## 2024-01-15 DIAGNOSIS — E349 Endocrine disorder, unspecified: Secondary | ICD-10-CM

## 2024-01-15 MED ORDER — FLUTICASONE PROPIONATE 50 MCG/ACT NA SUSP: 1.0000 | Freq: Every day | NASAL | 3 refills | Status: AC

## 2024-01-15 MED ORDER — NEEDLES & SYRINGES MISC
3 refills | Status: AC
Start: 2024-01-15 — End: ?

## 2024-01-15 NOTE — Telephone Encounter (Signed)
 Copied from CRM [id number]. Topic: Clinical - Medication Question >> Jan 15, 2024 10:35 AM Jacob Larsen wrote: Patient wants to know how to get his medication since he is a new patient  testosterone  cypionate (DEPOTESTOSTERONE CYPIONATE) 200 MG/ML injection fluticasone  (FLONASE ) 50 MCG/ACT nasal spray Needles & Syringes MISC  Preferred Pharmacy- [id number] - [place], [place] - [address] AT [place] [address] [place] [address] Phone: [phone] Fax: [phone] Hours: Open 24 hours

## 2024-01-18 ENCOUNTER — Other Ambulatory Visit (HOSPITAL_BASED_OUTPATIENT_CLINIC_OR_DEPARTMENT_OTHER): Payer: Self-pay | Admitting: Family Medicine

## 2024-01-18 MED ORDER — TESTOSTERONE CYPIONATE 200 MG/ML IM SOLN: INTRAMUSCULAR | 1 refills | Status: DC

## 2024-01-18 NOTE — Telephone Encounter (Signed)
 Copied from CRM [id number]. Topic: Clinical - Medication Refill >> Jan 18, 2024  9:31 AM Zipporah Him wrote: Medication: testosterone  cypionate (DEPOTESTOSTERONE CYPIONATE) 200 MG/ML injection   Has the patient contacted their pharmacy? Yes (Agent: If no, request that the patient contact the pharmacy for the refill. If patient does not wish to contact the pharmacy document the reason why and proceed with request.) (Agent: If yes, when and what did the pharmacy advise?)  This is the patient's preferred pharmacy:  [id number] - [place], [place] - [address] AT [facility] [address] [place] [address] Phone: [phone] Fax: [phone]     Is this the correct pharmacy for this prescription? No If no, delete pharmacy and type the correct one.   Has the prescription been filled recently? No  Is the patient out of the medication? No, two left  Has the patient been seen for an appointment in the last year OR does the patient have an upcoming appointment? Yes  Can we respond through MyChart? Yes  Agent: Please be advised that Rx refills may take up to 3 business days. We ask that you follow-up with your pharmacy.

## 2024-02-22 ENCOUNTER — Other Ambulatory Visit (HOSPITAL_BASED_OUTPATIENT_CLINIC_OR_DEPARTMENT_OTHER): Payer: Self-pay | Admitting: Family Medicine

## 2024-02-22 ENCOUNTER — Other Ambulatory Visit: Payer: Self-pay | Admitting: Family Medicine

## 2024-02-22 NOTE — Telephone Encounter (Signed)
 Copied from CRM [id number]. Topic: Clinical - Medication Refill >> Feb 22, 2024 10:10 AM Sasha H wrote: Medication: diclofenac  (VOLTAREN ) 75 MG EC tablet  Has the patient contacted their pharmacy? Yes (Agent: If no, request that the patient contact the pharmacy for the refill. If patient does not wish to contact the pharmacy document the reason why and proceed with request.) (Agent: If yes, when and what did the pharmacy advise?)  This is the patient's preferred pharmacy:  [id number] - [place], [place] - [address] AT [facility] [address] [place] [address] Phone: [phone] Fax: [phone]   Is this the correct pharmacy for this prescription? Yes If no, delete pharmacy and type the correct one.   Has the prescription been filled recently? Yes  Is the patient out of the medication? Yes  Has the patient been seen for an appointment in the last year OR does the patient have an upcoming appointment? Yes  Can we respond through MyChart? Yes  Agent: Please be advised that Rx refills may take up to 3 business days. We ask that you follow-up with your pharmacy.

## 2024-02-22 NOTE — Telephone Encounter (Signed)
 Last Fill: 11/17/23  Last OV: 12/12/23 Next OV: 04/12/24  Routing to provider for review/authorization.

## 2024-02-26 ENCOUNTER — Telehealth (HOSPITAL_BASED_OUTPATIENT_CLINIC_OR_DEPARTMENT_OTHER): Payer: Self-pay | Admitting: *Deleted

## 2024-02-26 MED ORDER — DICLOFENAC SODIUM 75 MG PO TBEC: DELAYED_RELEASE_TABLET | ORAL | 0 refills | Status: DC

## 2024-02-26 NOTE — Telephone Encounter (Signed)
 Would you like to refill medication, does not look like it has been filled by you previously  Copied from CRM [id number]. Topic: Clinical - Prescription Issue >> Feb 26, 2024 10:58 AM Dorthula Gavel H wrote: Reason for CRM: Pt is still waiting to hear back about his refill for diclofenac  (VOLTAREN ) 75 MG EC tablet

## 2024-02-26 NOTE — Telephone Encounter (Signed)
 Patient advised with verbal understanding

## 2024-03-12 ENCOUNTER — Ambulatory Visit: Payer: Medicare Other | Admitting: Internal Medicine

## 2024-03-20 ENCOUNTER — Telehealth (HOSPITAL_BASED_OUTPATIENT_CLINIC_OR_DEPARTMENT_OTHER): Payer: Self-pay | Admitting: Family Medicine

## 2024-03-20 NOTE — Telephone Encounter (Signed)
 Called pt to reschedule appt from 8/1 and pt wanted to know if we could refer/help him to dr. franky supple for shoulder pain after falling. He stated he had surgery done by dr supple in 2019 on his shoulder but when he called to try and make an appointment with him they said he couldn't make it until they asked the doctor but he stated he is in a lot of pain and can barely move his arm for every day tasks. Please advise

## 2024-03-20 NOTE — Telephone Encounter (Signed)
 Patient made an appt with de peru 7/10 to evaluate and place referral if needed

## 2024-03-21 ENCOUNTER — Ambulatory Visit (HOSPITAL_BASED_OUTPATIENT_CLINIC_OR_DEPARTMENT_OTHER): Admitting: Family Medicine

## 2024-04-01 ENCOUNTER — Other Ambulatory Visit (HOSPITAL_BASED_OUTPATIENT_CLINIC_OR_DEPARTMENT_OTHER)

## 2024-04-01 DIAGNOSIS — R7303 Prediabetes: Secondary | ICD-10-CM

## 2024-04-01 DIAGNOSIS — Z0000 Encounter for general adult medical examination without abnormal findings: Secondary | ICD-10-CM

## 2024-04-01 DIAGNOSIS — E349 Endocrine disorder, unspecified: Secondary | ICD-10-CM

## 2024-04-01 DIAGNOSIS — E782 Mixed hyperlipidemia: Secondary | ICD-10-CM

## 2024-04-01 DIAGNOSIS — Z125 Encounter for screening for malignant neoplasm of prostate: Secondary | ICD-10-CM

## 2024-04-01 DIAGNOSIS — E559 Vitamin D deficiency, unspecified: Secondary | ICD-10-CM

## 2024-04-02 LAB — CBC WITH DIFFERENTIAL/PLATELET
Basophils Absolute: 0 x10E3/uL (ref 0.0–0.2)
Basos: 1 %
EOS (ABSOLUTE): 0.1 x10E3/uL (ref 0.0–0.4)
Eos: 2 %
Hematocrit: 46.2 % (ref 37.5–51.0)
Hemoglobin: 15.7 g/dL (ref 13.0–17.7)
Immature Grans (Abs): 0 x10E3/uL (ref 0.0–0.1)
Immature Granulocytes: 0 %
Lymphocytes Absolute: 1 x10E3/uL (ref 0.7–3.1)
Lymphs: 22 %
MCH: 34 pg — ABNORMAL HIGH (ref 26.6–33.0)
MCHC: 34 g/dL (ref 31.5–35.7)
MCV: 100 fL — ABNORMAL HIGH (ref 79–97)
Monocytes Absolute: 0.4 x10E3/uL (ref 0.1–0.9)
Monocytes: 9 %
Neutrophils Absolute: 3.1 x10E3/uL (ref 1.4–7.0)
Neutrophils: 66 %
Platelets: 184 x10E3/uL (ref 150–450)
RBC: 4.62 x10E6/uL (ref 4.14–5.80)
RDW: 13.3 % (ref 11.6–15.4)
WBC: 4.7 x10E3/uL (ref 3.4–10.8)

## 2024-04-02 LAB — PSA TOTAL (REFLEX TO FREE): Prostate Specific Ag, Serum: 1.8 ng/mL (ref 0.0–4.0)

## 2024-04-02 LAB — COMPREHENSIVE METABOLIC PANEL WITH GFR
ALT: 21 IU/L (ref 0–44)
AST: 18 IU/L (ref 0–40)
Albumin: 3.7 g/dL — ABNORMAL LOW (ref 3.8–4.8)
Alkaline Phosphatase: 72 IU/L (ref 44–121)
BUN/Creatinine Ratio: 12 (ref 10–24)
BUN: 14 mg/dL (ref 8–27)
Bilirubin Total: 0.4 mg/dL (ref 0.0–1.2)
CO2: 20 mmol/L (ref 20–29)
Calcium: 9.3 mg/dL (ref 8.6–10.2)
Chloride: 103 mmol/L (ref 96–106)
Creatinine, Ser: 1.15 mg/dL (ref 0.76–1.27)
Globulin, Total: 2.5 g/dL (ref 1.5–4.5)
Glucose: 119 mg/dL — ABNORMAL HIGH (ref 70–99)
Potassium: 4.2 mmol/L (ref 3.5–5.2)
Sodium: 136 mmol/L (ref 134–144)
Total Protein: 6.2 g/dL (ref 6.0–8.5)
eGFR: 67 mL/min/1.73 (ref >59)

## 2024-04-02 LAB — HEMOGLOBIN A1C
Est. average glucose Bld gHb Est-mCnc: 123 mg/dL
Hgb A1c MFr Bld: 5.9 % — ABNORMAL HIGH (ref 4.8–5.6)

## 2024-04-02 LAB — LIPID PANEL
Chol/HDL Ratio: 3.2 ratio (ref 0.0–5.0)
Cholesterol, Total: 146 mg/dL (ref 100–199)
HDL: 46 mg/dL (ref >39)
LDL Chol Calc (NIH): 75 mg/dL (ref 0–99)
Triglycerides: 143 mg/dL (ref 0–149)
VLDL Cholesterol Cal: 25 mg/dL (ref 5–40)

## 2024-04-02 LAB — VITAMIN D 25 HYDROXY (VIT D DEFICIENCY, FRACTURES): Vit D, 25-Hydroxy: 103 ng/mL — ABNORMAL HIGH (ref 30.0–100.0)

## 2024-04-02 LAB — TESTOSTERONE: Testosterone: 770 ng/dL (ref 264–916)

## 2024-04-02 LAB — TSH RFX ON ABNORMAL TO FREE T4: TSH: 3.6 u[IU]/mL (ref 0.450–4.500)

## 2024-04-12 ENCOUNTER — Encounter (HOSPITAL_BASED_OUTPATIENT_CLINIC_OR_DEPARTMENT_OTHER): Admitting: Family Medicine

## 2024-04-15 ENCOUNTER — Other Ambulatory Visit (HOSPITAL_BASED_OUTPATIENT_CLINIC_OR_DEPARTMENT_OTHER): Payer: Self-pay | Admitting: Family Medicine

## 2024-04-15 MED ORDER — DICLOFENAC SODIUM 75 MG PO TBEC: DELAYED_RELEASE_TABLET | ORAL | 0 refills | Status: DC

## 2024-04-15 MED ORDER — ATORVASTATIN CALCIUM 40 MG PO TABS
ORAL_TABLET | ORAL | 3 refills | Status: AC
Start: 2024-04-15 — End: ?

## 2024-04-15 NOTE — Telephone Encounter (Signed)
 Copied from CRM [id number]. Topic: Clinical - Medication Refill >> Apr 15, 2024  7:49 AM Carlatta H wrote: Medication: atorvastatin  (LIPITOR ) 40 MG tablet diclofenac  (VOLTAREN ) 75 MG EC tablet   Has the patient contacted their pharmacy? Yes (Agent: If no, request that the patient contact the pharmacy for the refill. If patient does not wish to contact the pharmacy document the reason why and proceed with request.) (Agent: If yes, when and what did the pharmacy advise?) advised to contact office  This is the patient's preferred pharmacy:  [id number] - [place], [place] - [address] AT [facility] [address] [place] [address] Phone: [phone] Fax: [phone]    Is this the correct pharmacy for this prescription? Yes If no, delete pharmacy and type the correct one.   Has the prescription been filled recently? No  Is the patient out of the medication? Yes  Has the patient been seen for an appointment in the last year OR does the patient have an upcoming appointment? Yes  Can we respond through MyChart? Yes  Agent: Please be advised that Rx refills may take up to 3 business days. We ask that you follow-up with your pharmacy.

## 2024-04-17 ENCOUNTER — Other Ambulatory Visit: Payer: Self-pay | Admitting: Nurse Practitioner

## 2024-04-24 ENCOUNTER — Telehealth (HOSPITAL_BASED_OUTPATIENT_CLINIC_OR_DEPARTMENT_OTHER): Payer: Self-pay | Admitting: *Deleted

## 2024-04-24 NOTE — Telephone Encounter (Signed)
 Copied from CRM [id number]. Topic: Appointments - Scheduling Inquiry for Clinic >> Apr 24, 2024 10:19 AM Emylou G wrote: Reason for CRM: Patient called.[facility] PCP isn't available until Sept.. says neck is sore going up behind the back of head.. can he be seen by someone else?  Okay to leave a msg.SABRA

## 2024-05-08 NOTE — Telephone Encounter (Signed)
 Attempted to call pt to follow up on recent call about neck pain to see if pt needs to get in for a sooner appt than current scheduled one. Unable to reach but left pt detailed message.

## 2024-06-10 ENCOUNTER — Other Ambulatory Visit (HOSPITAL_BASED_OUTPATIENT_CLINIC_OR_DEPARTMENT_OTHER): Payer: Self-pay | Admitting: Family Medicine

## 2024-06-13 ENCOUNTER — Ambulatory Visit: Payer: Medicare Other | Admitting: Nurse Practitioner

## 2024-07-23 ENCOUNTER — Ambulatory Visit (INDEPENDENT_AMBULATORY_CARE_PROVIDER_SITE_OTHER): Admitting: Family Medicine

## 2024-07-23 ENCOUNTER — Encounter (HOSPITAL_BASED_OUTPATIENT_CLINIC_OR_DEPARTMENT_OTHER): Payer: Self-pay | Admitting: Family Medicine

## 2024-07-23 VITALS — BP 117/63 | HR 81 | Ht 73.0 in | Wt 187.0 lb

## 2024-07-23 DIAGNOSIS — E559 Vitamin D deficiency, unspecified: Secondary | ICD-10-CM | POA: Diagnosis not present

## 2024-07-23 DIAGNOSIS — I10 Essential (primary) hypertension: Secondary | ICD-10-CM

## 2024-07-23 DIAGNOSIS — R7303 Prediabetes: Secondary | ICD-10-CM

## 2024-07-23 DIAGNOSIS — Z01818 Encounter for other preprocedural examination: Secondary | ICD-10-CM | POA: Insufficient documentation

## 2024-07-23 DIAGNOSIS — E349 Endocrine disorder, unspecified: Secondary | ICD-10-CM | POA: Diagnosis not present

## 2024-07-23 DIAGNOSIS — E782 Mixed hyperlipidemia: Secondary | ICD-10-CM | POA: Diagnosis not present

## 2024-07-23 DIAGNOSIS — Z125 Encounter for screening for malignant neoplasm of prostate: Secondary | ICD-10-CM

## 2024-07-23 DIAGNOSIS — E538 Deficiency of other specified B group vitamins: Secondary | ICD-10-CM

## 2024-07-23 NOTE — Assessment & Plan Note (Signed)
 Can continue with medication regimen at this time, no changes made today.  Also recommend lifestyle modifications. Recent cholesterol panel well-controlled

## 2024-07-23 NOTE — Progress Notes (Signed)
    Procedures performed today:    None.  Independent interpretation of notes and tests performed by another provider:   None.  Brief History, Exam, Impression, and Recommendations:    Jacob Larsen is a 73 y.o. presenting for preoperative evaluation/clearance. The planned procedure is right reverse shoulder arthroplasty with the treatment goal of reduced pain and improved functioning. Cardiac risk for planned procedure is Intermediate (1 to 5%) - intraperitoneal or intrathoracic surgery, carotid endarterectomy, head and neck surgery, orthopedic surgery, prostate surgery  Signs or symptoms of cardiovascular disease? No New or unstable cardiopulmonary signs or symptoms? No Urinary symptoms or undergoing surgical implantation of foreign material (prosthetic joint, heart valve) or invasive urologic procedure? No  BP 117/63 (BP Location: Left Arm, Patient Position: Sitting, Cuff Size: Normal)   Pulse 81   Ht 6' 1 (1.854 m)   Wt 187 lb (84.8 kg)   SpO2 97%   BMI 24.67 kg/m   On exam, patient in no acute distress, vital signs stable.  Cardiovascular exam with regular rate and rhythm, lungs clear to auscultation bilaterally.  Essential hypertension Assessment & Plan: Blood pressure normal in office today.  Given reportedly normal readings at home as well, would be reasonable to continue without olmesartan  and monitor blood pressure closely.  Discussed lifestyle modifications including DASH diet.  If noticing any issues with blood pressure, can return to office to discuss management considerations.  Orders: -     CBC with Differential/Platelet; Future -     Comprehensive metabolic panel with GFR; Future  Testosterone  deficiency Assessment & Plan: Recent testosterone  labs show this being within normal range.  Can continue with current treatment plan  Orders: -     Testosterone ; Future  Vitamin D  deficiency Assessment & Plan: Vitamin D  level is slightly above normal range on  recent labs.  Discussed adjusting vitamin D  supplement.  We can plan to monitor vitamin D  level with next labs  Orders: -     VITAMIN D  25 Hydroxy (Vit-D Deficiency, Fractures); Future  Hyperlipidemia, mixed Assessment & Plan: Can continue with medication regimen at this time, no changes made today.  Also recommend lifestyle modifications. Recent cholesterol panel well-controlled  Orders: -     Lipid panel; Future  Prediabetes Assessment & Plan: Noted on prior labs, primarily managing with lifestyle modifications.  No concerns today related to symptoms suggesting overt diabetes -most recent A1c remains stable within prediabetes range  Orders: -     Hemoglobin A1c; Future  Prostate cancer screening -     PSA Total (Reflex To Free); Future  B12 deficiency -     Vitamin B12; Future  Preoperative clearance Assessment & Plan: Based on history and exam will order the following preoperative tests: none - will have labs completed through surgeon/anesthesia Patient is medically cleared to proceed with planned surgery.   Return in about 4 months (around 11/20/2024).   ___________________________________________ Brea Coleson [place], MD, ABFM, CAQSM Primary Care and Sports Medicine [facility]

## 2024-07-23 NOTE — Assessment & Plan Note (Signed)
 Blood pressure normal in office today.  Given reportedly normal readings at home as well, would be reasonable to continue without olmesartan and monitor blood pressure closely.  Discussed lifestyle modifications including DASH diet.  If noticing any issues with blood pressure, can return to office to discuss management considerations.

## 2024-07-23 NOTE — Assessment & Plan Note (Signed)
 Recent testosterone  labs show this being within normal range.  Can continue with current treatment plan

## 2024-07-23 NOTE — Assessment & Plan Note (Signed)
 Vitamin D  level is slightly above normal range on recent labs.  Discussed adjusting vitamin D  supplement.  We can plan to monitor vitamin D  level with next labs

## 2024-07-23 NOTE — Assessment & Plan Note (Signed)
 Based on history and exam will order the following preoperative tests: none - will have labs completed through surgeon/anesthesia Patient is medically cleared to proceed with planned surgery.

## 2024-07-23 NOTE — Assessment & Plan Note (Signed)
 Noted on prior labs, primarily managing with lifestyle modifications.  No concerns today related to symptoms suggesting overt diabetes -most recent A1c remains stable within prediabetes range

## 2024-08-01 ENCOUNTER — Other Ambulatory Visit (HOSPITAL_COMMUNITY): Payer: Self-pay | Admitting: Student

## 2024-08-01 DIAGNOSIS — M5416 Radiculopathy, lumbar region: Secondary | ICD-10-CM

## 2024-08-03 ENCOUNTER — Other Ambulatory Visit (HOSPITAL_BASED_OUTPATIENT_CLINIC_OR_DEPARTMENT_OTHER): Payer: Self-pay | Admitting: Family Medicine

## 2024-08-06 ENCOUNTER — Encounter (HOSPITAL_COMMUNITY): Payer: Self-pay | Admitting: *Deleted

## 2024-08-07 ENCOUNTER — Other Ambulatory Visit (HOSPITAL_BASED_OUTPATIENT_CLINIC_OR_DEPARTMENT_OTHER): Payer: Self-pay | Admitting: Family Medicine

## 2024-08-12 ENCOUNTER — Other Ambulatory Visit (HOSPITAL_COMMUNITY): Payer: Self-pay | Admitting: Student

## 2024-08-12 DIAGNOSIS — M5416 Radiculopathy, lumbar region: Secondary | ICD-10-CM

## 2024-08-14 ENCOUNTER — Inpatient Hospital Stay (HOSPITAL_COMMUNITY): Admission: RE | Admit: 2024-08-14 | Discharge: 2024-08-14 | Attending: Student

## 2024-08-14 ENCOUNTER — Ambulatory Visit (HOSPITAL_COMMUNITY): Admission: RE | Admit: 2024-08-14 | Discharge: 2024-08-14 | Attending: Student

## 2024-08-14 ENCOUNTER — Encounter (HOSPITAL_COMMUNITY): Payer: Self-pay

## 2024-08-14 DIAGNOSIS — M5416 Radiculopathy, lumbar region: Secondary | ICD-10-CM | POA: Diagnosis present

## 2024-09-04 ENCOUNTER — Other Ambulatory Visit (HOSPITAL_BASED_OUTPATIENT_CLINIC_OR_DEPARTMENT_OTHER): Payer: Self-pay | Admitting: Family Medicine

## 2024-09-04 DIAGNOSIS — I10 Essential (primary) hypertension: Secondary | ICD-10-CM

## 2024-09-04 NOTE — Telephone Encounter (Unsigned)
 Copied from CRM #[id number]. Topic: Clinical - Medication Refill >> Sep 04, 2024 10:30 AM Selinda RAMAN wrote: Medication: olmesartan  (BENICAR ) 40 MG tablet  Has the patient contacted their pharmacy? Yes   This is the patient's preferred pharmacy:    [facility] - [place], [place] - [address] [place] [address] Phone: [phone] Fax: [phone]  Is this the correct pharmacy for this prescription? Yes If no, delete pharmacy and type the correct one.   Has the prescription been filled recently? No  Is the patient out of the medication? No  Has the patient been seen for an appointment in the last year OR does the patient have an upcoming appointment? Yes  Can we respond through MyChart? Yes  Please assist patient further

## 2024-09-06 MED ORDER — OLMESARTAN MEDOXOMIL 40 MG PO TABS: ORAL_TABLET | ORAL | 3 refills | Status: AC

## 2024-09-08 NOTE — Patient Instructions (Signed)
 SURGICAL WAITING ROOM VISITATION Patients having surgery or a procedure may have no more than 2 support people in the waiting area - these visitors may rotate in the visitor waiting room.   If the patient needs to stay at the hospital during part of their recovery, the visitor guidelines for inpatient rooms apply.  PRE-OP VISITATION  Pre-op nurse will coordinate an appropriate time for 1 support person to accompany the patient in pre-op.  This support person may not rotate.  This visitor will be contacted when the time is appropriate for the visitor to come back in the pre-op area.  Please refer to the [facility] website for the visitor guidelines for Inpatients (after your surgery is over and you are in a regular room).  Temporary Visitor Restrictions  Children ages 48 and under will not be able to visit patients in [facility] under most circumstances. Visitation is not restricted outside of hospitals unless noted otherwise in the [facility] and Location Specific Visitation Guidelines at :      [email or web address] Visitors with respiratory illnesses are discouraged from visiting and should remain at home.  You are not required to quarantine at this time prior to your surgery. However, you must do this: Hand Hygiene often Do NOT share personal items Notify your provider if you are in close contact with someone who has COVID or you develop fever 100.4 or greater, new onset of sneezing, cough, sore throat, shortness of breath or body aches.  If you test positive for Covid or have been in contact with anyone that has tested positive in the last 10 days please notify you surgeon.    Your procedure is scheduled on:  THURSDAY  09-19-2024  Report to [facility] Main Entrance: [address] entrance where the [employer] is available.   Report to admitting at:  05:15   AM  Call this number if you have any questions or problems the morning of surgery [phone]  Do not eat food  after Midnight the night prior to your surgery/procedure.  After Midnight you may have the following liquids until   04:30  AM DAY OF SURGERY  Clear Liquid Diet Water Black Coffee (sugar ok, NO MILK/CREAM OR CREAMERS)  Tea (sugar ok, NO MILK/CREAM OR CREAMERS) regular and decaf                             Plain Jell-O  with no fruit (NO RED)                                           Fruit ices (not with fruit pulp, NO RED)                                     Popsicles (NO RED)                                                                  Juice: NO CITRUS JUICES: only apple, WHITE grape, WHITE cranberry Sports drinks like Gatorade or Powerade (NO RED)  FOLLOW ANY ADDITIONAL PRE OP INSTRUCTIONS YOU RECEIVED FROM YOUR SURGEON'S OFFICE!!!   Oral Hygiene is also important to reduce your risk of infection.        Remember - BRUSH YOUR TEETH THE MORNING OF SURGERY WITH YOUR REGULAR TOOTHPASTE  Do NOT smoke after Midnight the night before surgery.  STOP TAKING all Vitamins, Herbs and supplements 1 week before your surgery.   Take ONLY these medicines the morning of surgery with A SIP OF WATER: Omeprazole , tamsulosin , Fexofenadine  prn, Oxycodone  if needed for pain. You may use Flonase  nasal spray.  DO NOT TAKE  Olmesartan  the morning of your surgery.   No testosterone  injection before surgery.                    You may not have any metal on your body including jewelry, and body piercing  Do not wear  lotions, powders, cologne, or deodorant  Men may shave face and neck.  Contacts, Hearing Aids, dentures or bridgework may not be worn into surgery. DENTURES WILL BE REMOVED PRIOR TO SURGERY PLEASE DO NOT APPLY Poly grip OR ADHESIVES!!!  Patients discharged on the day of surgery will not be allowed to drive home.  Someone NEEDS to stay with you for the first 24 hours after anesthesia.  Do not bring your home medications to the hospital. The Pharmacy will dispense  medications listed on your medication list to you during your admission in the Hospital.  Please read over the following fact sheets you were given: IF YOU HAVE QUESTIONS ABOUT YOUR PRE-OP INSTRUCTIONS, PLEASE CALL [phone].      Pre-operative 4 CHG Bath Instructions   You can play a key role in reducing the risk of infection after surgery. Your skin needs to be as free of germs as possible. You can reduce the number of germs on your skin by washing with CHG (chlorhexidine  gluconate) soap before surgery. CHG is an antiseptic soap that kills germs and continues to kill germs even after washing.   DO NOT use if you have an allergy to chlorhexidine /CHG or antibacterial soaps. If your skin becomes reddened or irritated, stop using the CHG and notify one of our RNs at [phone]  Please shower with the CHG soap starting 4 days before surgery using the following schedule: SUNDAY  09-15-2024     Do NOT use CHG soap                                                                                                                      the morning of your  surgery.         Please keep in mind the following:  DO NOT shave, including legs and underarms, starting the day of your first shower.   You may shave your face at any point before/day of surgery.  Place clean sheets on your bed the day you start using CHG soap. Use a clean washcloth (not used since being washed) for each shower. DO NOT sleep with pets once you start using the CHG.  CHG Shower Instructions:  If you choose to wash your hair and private area, wash first with your normal shampoo/soap.  After you use shampoo/soap, rinse your hair and body thoroughly to remove shampoo/soap residue.  Turn the water OFF and apply about 3 tablespoons (45 ml) of CHG soap to a CLEAN washcloth.  Apply CHG soap ONLY FROM YOUR  NECK DOWN TO YOUR TOES (washing for 3-5 minutes)  DO NOT use CHG soap on face, private areas, open wounds, or sores.  Pay special attention to the area where your surgery is being performed.  If you are having back surgery, having someone wash your back for you may be helpful. Wait 2 minutes after CHG soap is applied, then you may rinse off the CHG soap.  Pat dry with a clean towel  Put on clean clothes/pajamas   If you choose to wear lotion, please use ONLY the CHG-compatible lotions on the back of this paper.     Additional instructions for the day of surgery: DO NOT APPLY any CHG Soap,  lotions, deodorants, cologne, or perfumes on the day of surgery  Put on clean/comfortable clothes.  Brush your teeth.  Ask your nurse before applying any prescription medications to the skin.   CHG Compatible Lotions   Aveeno Moisturizing lotion  Cetaphil Moisturizing Cream  Cetaphil Moisturizing Lotion  Clairol Herbal Essence Moisturizing Lotion, Dry Skin  Clairol Herbal Essence Moisturizing Lotion, Extra Dry Skin  Clairol Herbal Essence Moisturizing Lotion, Normal Skin  Curel Age Defying Therapeutic Moisturizing Lotion with Alpha Hydroxy  Curel Extreme Care Body Lotion  Curel Soothing Hands Moisturizing Hand Lotion  Curel Therapeutic Moisturizing Cream, Fragrance-Free  Curel Therapeutic Moisturizing Lotion, Fragrance-Free  Curel Therapeutic Moisturizing Lotion, Original Formula  Eucerin Daily Replenishing Lotion  Eucerin Dry Skin Therapy Plus Alpha Hydroxy Crme  Eucerin Dry Skin Therapy Plus Alpha Hydroxy Lotion  Eucerin Original Crme  Eucerin Original Lotion  Eucerin Plus Crme Eucerin Plus Lotion  Eucerin TriLipid Replenishing Lotion  Keri Anti-Bacterial Hand Lotion  Keri Deep Conditioning Original Lotion Dry Skin Formula Softly Scented  Keri Deep Conditioning Original Lotion, Fragrance Free Sensitive Skin Formula  Keri Lotion Fast Absorbing Fragrance Free Sensitive Skin Formula  Keri  Lotion Fast Absorbing Softly Scented Dry Skin Formula  Keri Original Lotion  Keri Skin Renewal Lotion Keri Silky Smooth Lotion  Keri Silky Smooth Sensitive Skin Lotion  Nivea Body Creamy Conditioning Oil  Nivea Body Extra Enriched Lotion  Nivea Body Original Lotion  Nivea Body Sheer Moisturizing Lotion Nivea Crme  Nivea Skin Firming Lotion  NutraDerm 30 Skin Lotion  NutraDerm Skin Lotion  NutraDerm Therapeutic Skin Cream  NutraDerm Therapeutic Skin Lotion  ProShield Protective Hand Cream  Provon moisturizing lotion        Preparing for Total Shoulder Arthroplasty ================================================================= Please follow these instructions carefully, in addition to any other special Bathing information that was explained to you at the Presurgical Appointment:  BENZOYL PEROXIDE 5% GEL: Used to kill bacteria on the skin which could cause an infection at  the surgery site.   Please do not use if you have an allergy to benzoyl peroxide. If your skin becomes reddened/irritated stop using the benzoyl peroxide and inform your Doctor.   Starting two days before surgery, apply as follows:  1. Apply benzoyl peroxide gel in the morning and at night. Apply after taking a shower. If you are not taking a shower, clean entire shoulder front, back, and side, along with the armpit with a clean wet washcloth.  2. Place a quarter-sized dollop of the gel on your SHOULDER and rub in thoroughly, making sure to cover the front, back, and side of your shoulder, along with the armpit.   2 Days prior to Surgery    TUESDAY  09-17-2024 First Application _______ Morning Second Application _______ Night  Day Before Surgery        [facility]  09-18-24 First Application______ Morning  On the night before surgery, wash your entire body (except hair, face and private areas) with CHG Soap. THEN, rub in the LAST application of the Benzoyl Peroxide Gel on your shoulder.   3.   DO NOT USE THE  BENZOYL PEROXIDE GEL OR CHG SOAP ON THE DAY OF YOUR SURGERY       FAILURE TO FOLLOW THESE INSTRUCTIONS MAY RESULT IN THE CANCELLATION OF YOUR SURGERY  PATIENT SIGNATURE_________________________________  NURSE SIGNATURE__________________________________  ________________________________________________________________________        Jacob Larsen    An incentive spirometer is a tool that can help keep your lungs clear and active. This tool measures how well you are filling your lungs with each breath. Taking long deep breaths may help reverse or decrease the chance of developing breathing (pulmonary) problems (especially infection) following: A long period of time when you are unable to move or be active. BEFORE THE PROCEDURE  If the spirometer includes an indicator to show your best effort, your nurse or respiratory therapist will set it to a desired goal. If possible, sit up straight or lean slightly forward. Try not to slouch. Hold the incentive spirometer in an upright position. INSTRUCTIONS FOR USE  Sit on the edge of your bed if possible, or sit up as far as you can in bed or on a chair. Hold the incentive spirometer in an upright position. Breathe out normally. Place the mouthpiece in your mouth and seal your lips tightly around it. Breathe in slowly and as deeply as possible, raising the piston or the ball toward the top of the column. Hold your breath for 3-5 seconds or for as long as possible. Allow the piston or ball to fall to the bottom of the column. Remove the mouthpiece from your mouth and breathe out normally. Rest for a few seconds and repeat Steps 1 through 7 at least 10 times every 1-2 hours when you are awake. Take your time and take a few normal breaths between deep breaths. The spirometer may include an indicator to show your best effort. Use the indicator as a goal to work toward during each repetition. After each set of 10 deep breaths, practice  coughing to be sure your lungs are clear. If you have an incision (the cut made at the time of surgery), support your incision when coughing by placing a pillow or rolled up towels firmly against it. Once you are able to get out of bed, walk around indoors and cough well. You may stop using the incentive spirometer when instructed by your caregiver.  RISKS AND COMPLICATIONS Take your time so you do  not get dizzy or light-headed. If you are in pain, you may need to take or ask for pain medication before doing incentive spirometry. It is harder to take a deep breath if you are having pain. AFTER USE Rest and breathe slowly and easily. It can be helpful to keep track of a log of your progress. Your caregiver can provide you with a simple table to help with this. If you are using the spirometer at home, follow these instructions: SEEK MEDICAL CARE IF:  You are having difficultly using the spirometer. You have trouble using the spirometer as often as instructed. Your pain medication is not giving enough relief while using the spirometer. You develop fever of 100.5 F (38.1 C) or higher.                                                                                                    SEEK IMMEDIATE MEDICAL CARE IF:  You cough up bloody sputum that had not been present before. You develop fever of 102 F (38.9 C) or greater. You develop worsening pain at or near the incision site. MAKE SURE YOU:  Understand these instructions. Will watch your condition. Will get help right away if you are not doing well or get worse. Document Released: 01/09/2007 Document Revised: 11/21/2011 Document Reviewed: 03/12/2007 [facility] Patient Information 2014 [place], [place].        If you would like to see a video about joint replacement:   [email or web address]

## 2024-09-08 NOTE — Progress Notes (Signed)
 COVID Vaccine received:  []  No [x]  Yes Date of any COVID positive Test in last 90 days:  PCP - Raymond De Cuba, MD clearance scanned to Media 08-02-24  Cardiologist -  PM&R- Deatrice Manus, MD   Gerard Beck NP   Chest x-ray - 2018  2v  Epic EKG - 09-04-2023   Stress Test -  ECHO -  Cardiac Cath -  CT Coronary Calcium  score:   Pacemaker / ICD device [x]  No []  Yes   Spinal Cord Stimulator:[]  No [x]  Yes  implanted 2021    pt to Bring remote     History of Sleep Apnea? []  No []  Yes   CPAP used?- []  No []  Yes    Medication on DOS: Omeprazole , tamsulosin , Fexofenadine  prn, Oxycodone     Flonase  nasal spray  Hold DOS:  Olmesartan    No testosterone  injection before surgery.   Patient has: []  NO Hx DM   [x]  Pre-DM   []  DM1  []   DM2 Does the patient monitor blood sugar?   []  N/A   [x]  No []  Yes  Last A1c was:  5.9 on  04-01-2024     Blood Thinner / Instructions:  none Aspirin Instructions:  none  Activity level: Able to walk up 2 flights of stairs without becoming significantly short of breath or having chest pain?   []    Yes   []  No,  would have:  Patient can perform ADLs without assistance.  []   Yes    []  No   Comments:   Anesthesia review: HTN, GERD, Pre-DM CPS- failed back surgeries,s/p ACDF x 2 (C3-4, 4-5,5-6 and then C6-7) in 2012 at [facility] w/ Dr. Leeann,  Bilateral foot drop, HOH- wears HAs,   Patient denies any S&S of respiratory illness or Covid - no shortness of breath, fever, cough or chest pain at PAT appointment.  Patient verbalized understanding and agreement to the Pre-Surgical Instructions that were given to them at this PAT appointment. Patient was also educated of the need to review these PAT instructions again prior to his surgery.I reviewed the appropriate phone numbers to call if they have any and questions or concerns.

## 2024-09-09 ENCOUNTER — Other Ambulatory Visit: Payer: Self-pay

## 2024-09-09 ENCOUNTER — Encounter (HOSPITAL_COMMUNITY)
Admission: RE | Admit: 2024-09-09 | Discharge: 2024-09-09 | Disposition: A | Discharge status: Home / Self Care | Source: Ambulatory Visit | Attending: Orthopedic Surgery | Admitting: Orthopedic Surgery

## 2024-09-09 ENCOUNTER — Encounter (HOSPITAL_COMMUNITY): Payer: Self-pay

## 2024-09-09 VITALS — BP 110/65 | HR 68 | Temp 98.2°F | Resp 16 | Ht 73.0 in | Wt 187.0 lb

## 2024-09-09 DIAGNOSIS — M75102 Unspecified rotator cuff tear or rupture of left shoulder, not specified as traumatic: Secondary | ICD-10-CM | POA: Diagnosis not present

## 2024-09-09 DIAGNOSIS — Z87891 Personal history of nicotine dependence: Secondary | ICD-10-CM | POA: Insufficient documentation

## 2024-09-09 DIAGNOSIS — G894 Chronic pain syndrome: Secondary | ICD-10-CM

## 2024-09-09 DIAGNOSIS — I10 Essential (primary) hypertension: Secondary | ICD-10-CM

## 2024-09-09 DIAGNOSIS — M1990 Unspecified osteoarthritis, unspecified site: Secondary | ICD-10-CM | POA: Diagnosis not present

## 2024-09-09 DIAGNOSIS — Z01818 Encounter for other preprocedural examination: Secondary | ICD-10-CM | POA: Diagnosis present

## 2024-09-09 DIAGNOSIS — R7303 Prediabetes: Secondary | ICD-10-CM

## 2024-09-09 DIAGNOSIS — M19212 Secondary osteoarthritis, left shoulder: Secondary | ICD-10-CM

## 2024-09-09 DIAGNOSIS — J449 Chronic obstructive pulmonary disease, unspecified: Secondary | ICD-10-CM | POA: Insufficient documentation

## 2024-09-09 DIAGNOSIS — F419 Anxiety disorder, unspecified: Secondary | ICD-10-CM | POA: Insufficient documentation

## 2024-09-09 DIAGNOSIS — F1120 Opioid dependence, uncomplicated: Secondary | ICD-10-CM

## 2024-09-09 HISTORY — DX: Myoneural disorder, unspecified: G70.9

## 2024-09-09 LAB — CBC
HCT: 48.2 % (ref 39.0–52.0)
Hemoglobin: 15.8 g/dL (ref 13.0–17.0)
MCH: 32.8 pg (ref 26.0–34.0)
MCHC: 32.8 g/dL (ref 30.0–36.0)
MCV: 100 fL (ref 80.0–100.0)
Platelets: 182 K/uL (ref 150–400)
RBC: 4.82 MIL/uL (ref 4.22–5.81)
RDW: 13.1 % (ref 11.5–15.5)
WBC: 3.8 K/uL — ABNORMAL LOW (ref 4.0–10.5)
nRBC: 0 % (ref 0.0–0.2)

## 2024-09-09 LAB — COMPREHENSIVE METABOLIC PANEL WITH GFR
ALT: 21 U/L (ref 0–44)
AST: 18 U/L (ref 15–41)
Albumin: 3.9 g/dL (ref 3.5–5.0)
Alkaline Phosphatase: 93 U/L (ref 38–126)
Anion gap: 10 (ref 5–15)
BUN: 16 mg/dL (ref 8–23)
CO2: 23 mmol/L (ref 22–32)
Calcium: 9.5 mg/dL (ref 8.9–10.3)
Chloride: 102 mmol/L (ref 98–111)
Creatinine, Ser: 1.05 mg/dL (ref 0.61–1.24)
GFR, Estimated: >60 mL/min
Glucose, Bld: 98 mg/dL (ref 70–99)
Potassium: 4.6 mmol/L (ref 3.5–5.1)
Sodium: 134 mmol/L — ABNORMAL LOW (ref 135–145)
Total Bilirubin: 0.6 mg/dL (ref 0.0–1.2)
Total Protein: 7.2 g/dL (ref 6.5–8.1)

## 2024-09-09 LAB — SURGICAL PCR SCREEN
MRSA, PCR: NEGATIVE
Staphylococcus aureus: NEGATIVE

## 2024-09-11 ENCOUNTER — Encounter (HOSPITAL_COMMUNITY): Payer: Self-pay

## 2024-09-11 NOTE — Anesthesia Preprocedure Evaluation (Addendum)
"                                    Anesthesia Evaluation  Patient identified by MRN, date of birth, ID band Patient awake    Reviewed: Allergy & Precautions, NPO status , Patient's Chart, lab work & pertinent test results  History of Anesthesia Complications Negative for: history of anesthetic complications  Airway Mallampati: I  TM Distance: >3 FB Neck ROM: Full    Dental  (+) Teeth Intact, Dental Advisory Given   Pulmonary COPD, former smoker   breath sounds clear to auscultation       Cardiovascular hypertension,  Rhythm:Regular Rate:Normal     Neuro/Psych  PSYCHIATRIC DISORDERS Anxiety        GI/Hepatic ,GERD  Medicated and Controlled,,  Endo/Other  neg diabetes    Renal/GU Renal disease     Musculoskeletal  (+) Arthritis , Osteoarthritis,   S/p ACDF, s/p back surgeries (L3-S1), with foot drop, s/p spinal cord stimulator     Abdominal   Peds  Hematology  Hgb 15.8, Plts 182K (09/09/24)    Anesthesia Other Findings   Reproductive/Obstetrics                              Anesthesia Physical Anesthesia Plan  ASA: 3  Anesthesia Plan: General   Post-op Pain Management: Regional block*   Induction: Intravenous  PONV Risk Score and Plan: 2 and Ondansetron , Dexamethasone  and Treatment may vary due to age or medical condition  Airway Management Planned: Oral ETT and Video Laryngoscope Planned  Additional Equipment: None  Intra-op Plan:   Post-operative Plan: Extubation in OR  Informed Consent: I have reviewed the patients History and Physical, chart, labs and discussed the procedure including the risks, benefits and alternatives for the proposed anesthesia with the patient or authorized representative who has indicated his/her understanding and acceptance.     Dental advisory given  Plan Discussed with: CRNA  Anesthesia Plan Comments: (See PAT note from 12/29)         Anesthesia Quick Evaluation  "

## 2024-09-11 NOTE — Progress Notes (Signed)
 " Case: [id number] Date/Time: 09/19/24 0715   Procedure: ARTHROPLASTY, SHOULDER, TOTAL, REVERSE (Left: Shoulder) -   Anesthesia type: General   Pre-op diagnosis: LEFT shoulder rotator cuff tear arthropathy   Location: [facility] ROOM 06 / [facility]   Surgeons: Melita Drivers, MD       DISCUSSION: Jacob Larsen is a 73 yo male with PMH of former smoking, COPD, GERD, arthritis, s/p back surgeries (L3-S1), with foot drop, hx of ACDF C3-6, s/p spinal cord stimulator, chronic pain with narcotic dependence, anxiety.  Hx of spinal cord stimulatory. Xray of thoracic spine notes leads terminate in the mid thoracic spine.  Seen by PCP on 07/23/24 for pre op clearance. BP noted to be controlled. Patient cleared for surgery:  Preoperative clearance Assessment & Plan: Based on history and exam will order the following preoperative tests: none - will have labs completed through surgeon/anesthesia Patient is medically cleared to proceed with planned surgery.    VS: BP 110/65 Comment: right arm sitting  Pulse 68   Temp 36.8 C (Oral)   Resp 16   Ht 6' 1 (1.854 m)   Wt 84.8 kg   SpO2 97%   BMI 24.67 kg/m   PROVIDERS: de Cuba, Quintin PARAS, MD   LABS: Labs reviewed: Acceptable for surgery. (all labs ordered are listed, but only abnormal results are displayed)  Labs Reviewed  COMPREHENSIVE METABOLIC PANEL WITH GFR - Abnormal; Notable for the following components:      Result Value   Sodium 134 (*)    All other components within normal limits  CBC - Abnormal; Notable for the following components:   WBC 3.8 (*)    All other components within normal limits  SURGICAL PCR SCREEN    Xray thoracic spine 08/14/24:  IMPRESSION: 1. Spinal cord stimulator in expected position with lead tips terminating in the mid thoracic spine region. 2. No acute findings.   EKG - pt refused EKG in pre op. Obtain DOS   Echo 01/09/2009:   Study Conclusions   1. Left ventricle: The estimated ejection fraction  was 60%. Wall     motion was normal; there were no regional wall motion     abnormalities.  2. Mitral valve: Trivial regurgitation.  3. Right ventricle: The cavity size was mildly dilated. Systolic     function was low normal.  4. Tricuspid valve: Mild regurgitation.  Past Medical History:  Diagnosis Date   Allergy    Anxiety    takes Valium  daily as needed   Arthritis    in neck   BPH (benign prostatic hyperplasia)    takes Flomax  daily   Brachial radiculitis 08/14/2013   Chronic back pain    DDD   COPD (chronic obstructive pulmonary disease) (HCC)    Diverticulosis    Foot drop    both feet   GERD (gastroesophageal reflux disease)    takes Omeprazole  daily as well as Zantac    Hyperlipidemia    takes Atorvastatin  daily   Hypertension    Hypogonadism male    Neuromuscular disease (HCC)    Foot drop and radiculopathy, neuropathy BLE   Nocturia    Numbness    only in 4th and 5th finger on left hand and left leg   Prediabetes 12/26/2018   Reflux esophagitis 08/13/2013   Seasonal allergies    takes Claritin  daily as needed and uses Flonase  daily as needed   Urinary frequency    takes Flomax  daily   Wears hearing aid 10/10/2019  Past Surgical History:  Procedure Laterality Date   BACK SURGERY  2016 x 2 L3-L5, 12/2016 S1   lumbar fusion x 3   CERVICAL FUSION  2011/2012   COLONOSCOPY     KNEE ARTHROSCOPY     right 1985; left 1992   PROSTATE SURGERY  10/07/2019   Rezium water vapor procedure by Dr. Carolee   SEPTOPLASTY     SHOULDER ARTHROSCOPY Left 2015   SHOULDER ARTHROSCOPY WITH ROTATOR CUFF REPAIR Right 08/18/2017   Dr. Melita   SHOULDER SURGERY Right 2008   SPINAL CORD STIMULATOR IMPLANT  08/21/2020   Dr. Alm Manus    MEDICATIONS:  atorvastatin  (LIPITOR ) 40 MG tablet   diclofenac  (VOLTAREN ) 75 MG EC tablet   Docusate Calcium  (STOOL SOFTENER PO)   fexofenadine  (ALLEGRA ) 180 MG tablet   Flaxseed, Linseed, (FLAX SEED OIL) 1000 MG CAPS   fluticasone   (FLONASE ) 50 MCG/ACT nasal spray   Magnesium  250 MG TABS   olmesartan  (BENICAR ) 40 MG tablet   omeprazole  (PRILOSEC) 20 MG capsule   Oxycodone  HCl 10 MG TABS   sildenafil  (VIAGRA ) 100 MG tablet   tamsulosin  (FLOMAX ) 0.4 MG CAPS capsule   testosterone  cypionate (DEPOTESTOSTERONE CYPIONATE) 200 MG/ML injection   vitamin B-12 (CYANOCOBALAMIN ) 1000 MCG tablet   VITAMIN D  PO   zinc gluconate 50 MG tablet   No current facility-administered medications for this encounter.   Burnard CHRISTELLA Odis DEVONNA [facility] Short Stay/Anesthesiology [facility] Phone [phone] 09/11/2024 2:12 PM        "

## 2024-09-19 ENCOUNTER — Ambulatory Visit (HOSPITAL_COMMUNITY)

## 2024-09-19 ENCOUNTER — Encounter (HOSPITAL_COMMUNITY)
Admission: RE | Disposition: A | Discharge status: Home / Self Care | Payer: Self-pay | Source: Ambulatory Visit | Attending: Orthopedic Surgery

## 2024-09-19 ENCOUNTER — Ambulatory Visit (HOSPITAL_COMMUNITY)
Admission: RE | Admit: 2024-09-19 | Discharge: 2024-09-19 | Disposition: A | Discharge status: Home / Self Care | Source: Ambulatory Visit | Attending: Orthopedic Surgery | Admitting: Orthopedic Surgery

## 2024-09-19 ENCOUNTER — Other Ambulatory Visit: Payer: Self-pay

## 2024-09-19 ENCOUNTER — Encounter (HOSPITAL_COMMUNITY): Payer: Self-pay | Admitting: Orthopedic Surgery

## 2024-09-19 ENCOUNTER — Ambulatory Visit (HOSPITAL_COMMUNITY): Payer: Self-pay | Admitting: Medical

## 2024-09-19 DIAGNOSIS — Z981 Arthrodesis status: Secondary | ICD-10-CM | POA: Diagnosis not present

## 2024-09-19 DIAGNOSIS — Z9682 Presence of neurostimulator: Secondary | ICD-10-CM | POA: Diagnosis not present

## 2024-09-19 DIAGNOSIS — M12812 Other specific arthropathies, not elsewhere classified, left shoulder: Secondary | ICD-10-CM | POA: Diagnosis not present

## 2024-09-19 DIAGNOSIS — I10 Essential (primary) hypertension: Secondary | ICD-10-CM | POA: Diagnosis not present

## 2024-09-19 DIAGNOSIS — J449 Chronic obstructive pulmonary disease, unspecified: Secondary | ICD-10-CM | POA: Insufficient documentation

## 2024-09-19 DIAGNOSIS — Z79899 Other long term (current) drug therapy: Secondary | ICD-10-CM | POA: Insufficient documentation

## 2024-09-19 DIAGNOSIS — N289 Disorder of kidney and ureter, unspecified: Secondary | ICD-10-CM | POA: Diagnosis not present

## 2024-09-19 DIAGNOSIS — M129 Arthropathy, unspecified: Secondary | ICD-10-CM | POA: Insufficient documentation

## 2024-09-19 DIAGNOSIS — Z01818 Encounter for other preprocedural examination: Secondary | ICD-10-CM

## 2024-09-19 DIAGNOSIS — Z87891 Personal history of nicotine dependence: Secondary | ICD-10-CM | POA: Insufficient documentation

## 2024-09-19 DIAGNOSIS — M75102 Unspecified rotator cuff tear or rupture of left shoulder, not specified as traumatic: Secondary | ICD-10-CM | POA: Insufficient documentation

## 2024-09-19 DIAGNOSIS — K219 Gastro-esophageal reflux disease without esophagitis: Secondary | ICD-10-CM | POA: Insufficient documentation

## 2024-09-19 HISTORY — PX: REVERSE SHOULDER ARTHROPLASTY: SHX5054

## 2024-09-19 SURGERY — ARTHROPLASTY, SHOULDER, TOTAL, REVERSE
Anesthesia: General | Site: Shoulder | Laterality: Left

## 2024-09-19 MED ORDER — PROPOFOL 10 MG/ML IV BOLUS
INTRAVENOUS | Status: AC
  Filled 2024-09-19: qty 20

## 2024-09-19 MED ORDER — LIDOCAINE HCL (CARDIAC) PF 100 MG/5ML IV SOSY
PREFILLED_SYRINGE | INTRAVENOUS | Status: DC | PRN
  Administered 2024-09-19: 100 mg via INTRAVENOUS

## 2024-09-19 MED ORDER — TRANEXAMIC ACID-NACL 1000-0.7 MG/100ML-% IV SOLN
1000.0000 mg | INTRAVENOUS | Status: AC
  Administered 2024-09-19: 1000 mg via INTRAVENOUS
  Filled 2024-09-19: qty 100

## 2024-09-19 MED ORDER — METOCLOPRAMIDE HCL 5 MG/ML IJ SOLN: 5.0000 mg | Freq: Three times a day (TID) | INTRAMUSCULAR | Status: DC | PRN

## 2024-09-19 MED ORDER — TRANEXAMIC ACID 1000 MG/10ML IV SOLN: 1000.0000 mg | INTRAVENOUS | Status: DC

## 2024-09-19 MED ORDER — HYDROMORPHONE HCL 4 MG PO TABS: 4.0000 mg | ORAL_TABLET | ORAL | 0 refills | Status: AC | PRN

## 2024-09-19 MED ORDER — 0.9 % SODIUM CHLORIDE (POUR BTL) OPTIME
TOPICAL | Status: DC | PRN
  Administered 2024-09-19: 1000 mL

## 2024-09-19 MED ORDER — CEFAZOLIN SODIUM-DEXTROSE 2-4 GM/100ML-% IV SOLN: 2.0000 g | INTRAVENOUS | Status: DC

## 2024-09-19 MED ORDER — FENTANYL CITRATE (PF) 100 MCG/2ML IJ SOLN
INTRAMUSCULAR | Status: AC
  Filled 2024-09-19: qty 2

## 2024-09-19 MED ORDER — CEFAZOLIN SODIUM-DEXTROSE 2-4 GM/100ML-% IV SOLN
2.0000 g | INTRAVENOUS | Status: AC
  Administered 2024-09-19: 2 g via INTRAVENOUS
  Filled 2024-09-19: qty 100

## 2024-09-19 MED ORDER — LACTATED RINGERS IV SOLN: INTRAVENOUS | Status: DC

## 2024-09-19 MED ORDER — PHENYLEPHRINE HCL (PRESSORS) 10 MG/ML IV SOLN
INTRAVENOUS | Status: DC | PRN
  Administered 2024-09-19: 160 ug via INTRAVENOUS
  Administered 2024-09-19 (×2): 80 ug via INTRAVENOUS

## 2024-09-19 MED ORDER — SUGAMMADEX SODIUM 200 MG/2ML IV SOLN
INTRAVENOUS | Status: DC | PRN
  Administered 2024-09-19: 200 mg via INTRAVENOUS

## 2024-09-19 MED ORDER — ONDANSETRON HCL 4 MG/2ML IJ SOLN
INTRAMUSCULAR | Status: DC | PRN
  Administered 2024-09-19: 4 mg via INTRAVENOUS

## 2024-09-19 MED ORDER — PROPOFOL 10 MG/ML IV BOLUS
INTRAVENOUS | Status: DC | PRN
  Administered 2024-09-19: 100 mg via INTRAVENOUS
  Administered 2024-09-19: 40 mg via INTRAVENOUS
  Administered 2024-09-19: 10 mg via INTRAVENOUS

## 2024-09-19 MED ORDER — PHENYLEPHRINE 80 MCG/ML (10ML) SYRINGE FOR IV PUSH (FOR BLOOD PRESSURE SUPPORT)
PREFILLED_SYRINGE | INTRAVENOUS | Status: AC
  Filled 2024-09-19: qty 10

## 2024-09-19 MED ORDER — ONDANSETRON HCL 4 MG PO TABS: 4.0000 mg | ORAL_TABLET | Freq: Three times a day (TID) | ORAL | 0 refills | Status: AC | PRN

## 2024-09-19 MED ORDER — PHENYLEPHRINE HCL (PRESSORS) 10 MG/ML IV SOLN
INTRAVENOUS | Status: AC
  Filled 2024-09-19: qty 1

## 2024-09-19 MED ORDER — CYCLOBENZAPRINE HCL 10 MG PO TABS: 10.0000 mg | ORAL_TABLET | Freq: Three times a day (TID) | ORAL | 1 refills | Status: AC | PRN

## 2024-09-19 MED ORDER — METOCLOPRAMIDE HCL 5 MG PO TABS: 5.0000 mg | ORAL_TABLET | Freq: Three times a day (TID) | ORAL | Status: DC | PRN

## 2024-09-19 MED ORDER — STERILE WATER FOR IRRIGATION IR SOLN
Status: DC | PRN
  Administered 2024-09-19: 1000 mL

## 2024-09-19 MED ORDER — PHENYLEPHRINE HCL-NACL 20-0.9 MG/250ML-% IV SOLN
INTRAVENOUS | Status: DC | PRN
  Administered 2024-09-19: 25 ug/min via INTRAVENOUS

## 2024-09-19 MED ORDER — FENTANYL CITRATE (PF) 100 MCG/2ML IJ SOLN
INTRAMUSCULAR | Status: DC | PRN
  Administered 2024-09-19: 100 ug via INTRAVENOUS

## 2024-09-19 MED ORDER — DEXAMETHASONE SOD PHOSPHATE PF 10 MG/ML IJ SOLN
INTRAMUSCULAR | Status: DC | PRN
  Administered 2024-09-19: 5 mg via INTRAVENOUS

## 2024-09-19 MED ORDER — ROCURONIUM BROMIDE 100 MG/10ML IV SOLN
INTRAVENOUS | Status: DC | PRN
  Administered 2024-09-19: 70 mg via INTRAVENOUS

## 2024-09-19 MED ORDER — VANCOMYCIN HCL 1000 MG IV SOLR
INTRAVENOUS | Status: DC | PRN
  Administered 2024-09-19: 1000 mg via TOPICAL

## 2024-09-19 MED ORDER — CHLORHEXIDINE GLUCONATE 0.12 % MT SOLN
15.0000 mL | Freq: Once | OROMUCOSAL | Status: AC
  Administered 2024-09-19: 15 mL via OROMUCOSAL

## 2024-09-19 MED ORDER — ORAL CARE MOUTH RINSE: 15.0000 mL | Freq: Once | OROMUCOSAL | Status: AC

## 2024-09-19 MED ORDER — VANCOMYCIN HCL 1000 MG IV SOLR
INTRAVENOUS | Status: AC
  Filled 2024-09-19: qty 20

## 2024-09-19 SURGICAL SUPPLY — 57 items
BAG COUNTER SPONGE SURGICOUNT (BAG) IMPLANT
BAG ZIPLOCK 12X15 (MISCELLANEOUS) ×1 IMPLANT
BLADE SAW SGTL 83.5X18.5 (BLADE) ×1 IMPLANT
BNDG COHESIVE 4X5 TAN STRL LF (GAUZE/BANDAGES/DRESSINGS) ×1 IMPLANT
COOLER ICEMAN CLASSIC (MISCELLANEOUS) ×1 IMPLANT
COVER BACK TABLE 60X90IN (DRAPES) ×1 IMPLANT
COVER SURGICAL LIGHT HANDLE (MISCELLANEOUS) ×1 IMPLANT
CUP SUT UNIV REVERS 39 NEU (Shoulder) IMPLANT
DRAPE SHEET LG 3/4 BI-LAMINATE (DRAPES) ×1 IMPLANT
DRAPE SURG 17X11 SM STRL (DRAPES) ×1 IMPLANT
DRAPE SURG ORHT 6 SPLT 77X108 (DRAPES) ×2 IMPLANT
DRAPE TOP 10253 STERILE (DRAPES) ×1 IMPLANT
DRAPE U-SHAPE 47X51 STRL (DRAPES) ×1 IMPLANT
DRESSING AQUACEL AG SP 3.5X6 (GAUZE/BANDAGES/DRESSINGS) ×1 IMPLANT
DURAPREP 26ML APPLICATOR (WOUND CARE) ×1 IMPLANT
ELECT BLADE TIP CTD 4 INCH (ELECTRODE) ×1 IMPLANT
ELECT PENCIL ROCKER SW 15FT (MISCELLANEOUS) ×1 IMPLANT
ELECT REM PT RETURN 15FT ADLT (MISCELLANEOUS) ×1 IMPLANT
FACESHIELD WRAPAROUND OR TEAM (MASK) ×5 IMPLANT
GLENOID UNI REV MOD 24 +2 LAT (Joint) IMPLANT
GLENOSPHERE 39+4 LAT/24 UNI RV (Joint) IMPLANT
GLOVE BIO SURGEON STRL SZ7.5 (GLOVE) ×1 IMPLANT
GLOVE BIO SURGEON STRL SZ8 (GLOVE) ×1 IMPLANT
GLOVE SS BIOGEL STRL SZ 7 (GLOVE) ×1 IMPLANT
GLOVE SS BIOGEL STRL SZ 7.5 (GLOVE) ×1 IMPLANT
GOWN SPEC L4 XLG W/TWL (GOWN DISPOSABLE) ×1 IMPLANT
GOWN STRL SURGICAL XL XLNG (GOWN DISPOSABLE) ×1 IMPLANT
INSERT HUMERAL 39/+6 (Insert) IMPLANT
KIT BASIN OR (CUSTOM PROCEDURE TRAY) ×1 IMPLANT
KIT TURNOVER KIT A (KITS) ×1 IMPLANT
MANIFOLD NEPTUNE II (INSTRUMENTS) ×1 IMPLANT
NEEDLE TAPERED W/ NITINOL LOOP (MISCELLANEOUS) ×1 IMPLANT
NS IRRIG 1000ML POUR BTL (IV SOLUTION) ×1 IMPLANT
PACK SHOULDER (CUSTOM PROCEDURE TRAY) ×1 IMPLANT
PAD ARMBOARD POSITIONER FOAM (MISCELLANEOUS) ×1 IMPLANT
PAD COLD SHLDR WRAP-ON (PAD) ×1 IMPLANT
PIN NITINOL TARGETER 2.8 (PIN) IMPLANT
PIN SET MODULAR GLENOID SYSTEM (PIN) IMPLANT
RESTRAINT HEAD UNIVERSAL NS (MISCELLANEOUS) ×1 IMPLANT
SCREW CENTRAL MOD 30MM (Screw) IMPLANT
SCREW GLENOSPHERE (Miscellaneous) IMPLANT
SCREW PERI LOCK 5.5X32 (Screw) IMPLANT
SCREW PERI LOCK 5.5X36 (Screw) IMPLANT
SCREW PERIPHERAL 5.5X20 LOCK (Screw) IMPLANT
SLING ARM FOAM STRAP LRG (SOFTGOODS) IMPLANT
SLING ARM FOAM STRAP MED (SOFTGOODS) IMPLANT
SOLN STERILE WATER BTL 1000 ML (IV SOLUTION) ×2 IMPLANT
STEM HUM UNIV REV SZ 10 (Stem) IMPLANT
STRIP CLOSURE SKIN 1/2X4 (GAUZE/BANDAGES/DRESSINGS) ×1 IMPLANT
SUT MNCRL AB 3-0 PS2 18 (SUTURE) ×1 IMPLANT
SUT MON AB 2-0 CT1 36 (SUTURE) ×1 IMPLANT
SUT VIC AB 1 CT1 36 (SUTURE) ×1 IMPLANT
SUTURE TAPE 1.3 40 TPR END (SUTURE) ×2 IMPLANT
TOWEL GREEN STERILE FF (TOWEL DISPOSABLE) ×1 IMPLANT
TOWEL OR DSP ST BLU DLX 10/PK (DISPOSABLE) ×1 IMPLANT
TUBE SUCTION HIGH CAP CLEAR NV (SUCTIONS) ×1 IMPLANT
TUBING CONNECTING 10 (TUBING) ×1 IMPLANT

## 2024-09-19 NOTE — Op Note (Signed)
 09/19/2024  9:19 AM  PATIENT:   Jacob Larsen  74 y.o. male  PRE-OPERATIVE DIAGNOSIS:  LEFT shoulder rotator cuff tear arthropathy  POST-OPERATIVE DIAGNOSIS: Same  PROCEDURE: Left shoulder reverse arthroplasty utilizing a press-fit size 10 Arthrex short humeral stem with a neutral metathesis, +6 polyethylene insert, 39/+4 glenosphere on a small/+2 baseplate, 30 mm lag screw.  SURGEON:  Melita Franky BATTLE M.D.  ASSISTANTS: Randine Ricks, PA-C  Randine Ricks, PA-C was utilized as an geophysicist/field seismologist throughout this case, essential for help with positioning the patient, positioning extremity, tissue manipulation, implantation of the prosthesis, suture management, wound closure, and intraoperative decision-making.  ANESTHESIA:   General Endotracheal and interscalene block with Exparel   EBL: 150 cc  SPECIMEN: None  Drains: None   PATIENT DISPOSITION:  PACU - hemodynamically stable.    PLAN OF CARE: Discharge to home after PACU  Brief history:  Jacob Larsen is a 57 gentleman well-known to our practice with a long history of shoulder difficulties for which we had performed previous arthroscopic surgery on both of his shoulders, presents with chronic and progressive increasing left shoulder pain with known underlying severe rotator cuff tear arthropathy.  Due to his increasing pain and functional limitations and failure to respond to prolonged attempts at conservative management, he is brought to the operating room today for planned left shoulder reverse arthroplasty.  Preoperatively, I counseled the patient regarding treatment options and risks versus benefits thereof.  Possible surgical complications were all reviewed including potential for bleeding, infection, neurovascular injury, persistent pain, loss of motion, anesthetic complication, failure of the implant, and possible need for additional surgery. They understand and accept and agrees with our planned procedure.   Procedure  detail:  After undergoing routine preop evaluation the patient received prophylactic antibiotics and interscalene block with Exparel  was established in the holding area by the anesthesia department.  Subsequently placed spine on the operating table and underwent the smooth induction of a general endotracheal anesthesia.  Placed into the beachchair position and appropriately padded and protected.  The left shoulder girdle region was sterilely prepped and draped in standard fashion.  Timeout was called.  A deltopectoral approach to the left shoulder was made sharply and electrocautery was used for hemostasis and deep dissection.  The deltopectoral interval was then developed from proximal to distal with the vein taken laterally.  The conjoined tendon was mobilized and retracted medially.  The long head biceps tendon was then tenodesed at the upper border of the pectoralis major tendon with the proximal segment unroofed and excised.  Adhesions were divided beneath the deltoid.  The superior remnant of the rotator cuff was then split from the apex of the bicipital groove to the base of the coracoid and the subscapularis was then separated from the lesser tuberosity using electrocautery and the margin was tagged with a pair of grasping suture tape sutures.  Capsular attachments were then divided from the anterior and inferior margins of the humeral neck and the humeral head was then delivered through the wound.  An extra medullary guide was then used to outline the proposed humeral head resection which we performed with an oscillating saw at approximately 20 degrees of retroversion.  Marginal osteophytes were then removed with a rondure.  We did identify some retained suture anchors within the humeral metaphysis which were removed and a metal cap was then placed over the cut proximal humeral surface.  The glenoid was then exposed and a circumferential labral resection was performed.  Of note there was  significant  synovitis and extensive synovectomy was performed.  A guidepin was then directed into the center of the glenoid and the glenoid was reamed with the central followed by the peripheral reamer to a stable subchondral bony bed in preparation completed with a drill and tapped for a 30 mm lag screw.  Our baseplate was then assembled and inserted with vancomycin  powder applied to the threads of the lag screw and excellent fixation was achieved.  The peripheral locking screws were all then placed using standard technique with excellent fixation.  A 39/+4 glenosphere was then impacted onto the baseplate and a central locking screw was placed.  Our attention was then directed back to the proximal humerus where the canal was opened and we did removed several additional retained suture anchors and then began broaching ultimately up to a size 10 short humeral stem at 20 degrees of retroversion.  A neutral metaphyseal rim guide was then used to prepare the metaphysis.  A trial implant was placed and trial reduction showed excellent motion stability and soft tissue balance.  Trial was then removed.  Our final implant was assembled.  The canal was irrigated cleaned and dried and vancomycin  powder applied into the canal.  The final implant was seated with excellent fixation.  A series of trial reductions was then performed and ultimately felt that the +6 poly gives the best motion stability and soft tissue balance.  The trial was then removed.  A final poly was impacted on the implant after was cleaned and dried.  Our final reduction again showed excellent motion stability and soft tissue balance all much to our satisfaction.  I then mobilized the subscapularis and confirmed good elasticity and the subscap was then repaired back to the eyelets on the collar of the implant using the previously placed suture tape sutures.  Final irrigation completed.  Hemostasis was obtained.  Balance of the vancomycin  powder was then sprayed  liberally throughout the deep soft tissue planes.  The deltopectoral interval was reapproximated with a series of figure-of-eight number Vicryl sutures.  2-0 Monocryl used to close the subcu layer and intracuticular 3-0 Monocryl used to close the skin followed by Steri-Strips and Aquacel dressing.  Left arm placed into a sling.  The patient was awakened, extubated, and taken to the recovery room in stable condition.  Franky CHRISTELLA Pointer MD   Contact # [phone]

## 2024-09-19 NOTE — Discharge Instructions (Signed)

## 2024-09-19 NOTE — Transfer of Care (Signed)
 Immediate Anesthesia Transfer of Care Note  Patient: Jacob Larsen  Procedure(s) Performed: ARTHROPLASTY, SHOULDER, TOTAL, REVERSE (Left: Shoulder)  Patient Location: PACU  Anesthesia Type:General  Level of Consciousness: awake and alert   Airway & Oxygen Therapy: Patient Spontanous Breathing and Patient connected to nasal cannula oxygen  Post-op Assessment: Report given to RN and Post -op Vital signs reviewed and stable  Post vital signs: Reviewed and stable  Last Vitals:  Vitals Value Taken Time  BP 120/77 09/19/24 09:38  Temp    Pulse 77 09/19/24 09:41  Resp 14 09/19/24 09:41  SpO2 98 % 09/19/24 09:41  Vitals shown include unfiled device data.  Last Pain:  Vitals:   09/19/24 0615  TempSrc: Oral  PainSc:          Complications: No notable events documented.

## 2024-09-19 NOTE — H&P (Signed)
 Jacob Larsen    Chief Complaint: LEFT shoulder rotator cuff tear arthropathy HPI: The patient is a 74 y.o. male well-known to our practice with a long history of bilateral shoulder pain related to underlying severe rotator cuff tear arthropathy's.  His left shoulder is currently most symptomatic and he is brought to the operating today for planned left shoulder reverse arthroplasty.  Past Medical History:  Diagnosis Date   Allergy    Anxiety    takes Valium  daily as needed   Arthritis    in neck   BPH (benign prostatic hyperplasia)    takes Flomax  daily   Brachial radiculitis 08/14/2013   Chronic back pain    DDD   COPD (chronic obstructive pulmonary disease) (HCC)    Diverticulosis    Foot drop    both feet   GERD (gastroesophageal reflux disease)    takes Omeprazole  daily as well as Zantac    Hyperlipidemia    takes Atorvastatin  daily   Hypertension    Hypogonadism male    Neuromuscular disease (HCC)    Foot drop and radiculopathy, neuropathy BLE   Nocturia    Numbness    only in 4th and 5th finger on left hand and left leg   Prediabetes 12/26/2018   Reflux esophagitis 08/13/2013   Seasonal allergies    takes Claritin  daily as needed and uses Flonase  daily as needed   Urinary frequency    takes Flomax  daily   Wears hearing aid 10/10/2019      Past Surgical History:  Procedure Laterality Date   BACK SURGERY  2016 x 2 L3-L5, 12/2016 S1   lumbar fusion x 3   CERVICAL FUSION  2011/2012   COLONOSCOPY     KNEE ARTHROSCOPY     right 1985; left 1992   PROSTATE SURGERY  10/07/2019   Rezium water  vapor procedure by Dr. Carolee   SEPTOPLASTY     SHOULDER ARTHROSCOPY Left 2015   SHOULDER ARTHROSCOPY WITH ROTATOR CUFF REPAIR Right 08/18/2017   Dr. Melita   SHOULDER SURGERY Right 2008   SPINAL CORD STIMULATOR IMPLANT  08/21/2020   Dr. Alm Manus    Family History  Problem Relation Age of Onset   Leukemia Mother    Diabetes Mother    Hypertension Father     Diabetes Father    Colon cancer Neg Hx    Stomach cancer Neg Hx    Esophageal cancer Neg Hx    Rectal cancer Neg Hx     Social History:  reports that he quit smoking about 29 years ago. His smoking use included cigarettes. He has been exposed to tobacco smoke. He has never used smokeless tobacco. He reports that he does not currently use alcohol. He reports that he does not use drugs.  BMI: Estimated body mass index is 24.67 kg/m as calculated from the following:   Height as of 09/09/24: 6' 1 (1.854 m).   Weight as of 09/09/24: 84.8 kg.  Lab Results  Component Value Date   ALBUMIN  3.9 09/09/2024   Diabetes: Patient does not have a diagnosis of diabetes. Lab Results  Component Value Date   HGBA1C 5.9 (H) 04/01/2024     Smoking Status:       Medications Prior to Admission  Medication Sig Dispense Refill   atorvastatin  (LIPITOR ) 40 MG tablet TAKE 1 TABLET 3 DAYS A WEEK FOR CHOLESTEROL. 39 tablet 3   diclofenac  (VOLTAREN ) 75 MG EC tablet 1TAB TWICE A DAY WITH FOOD AS NEEDED FOR  PAIN & INFLAMMATION*LIMIT TO 5 DAYS/WK TO PROTECT KIDNEYS 100 tablet 0   Docusate Calcium  (STOOL SOFTENER PO) Take 1 tablet by mouth daily as needed (Constipation).     fexofenadine  (ALLEGRA ) 180 MG tablet Take 1 tablet (180 mg total) by mouth daily. (Patient taking differently: Take 180 mg by mouth daily as needed for allergies.) 90 tablet 3   Flaxseed, Linseed, (FLAX SEED OIL) 1000 MG CAPS Take 1 capsule (1,000 mg total) by mouth 2 (two) times daily.  0   fluticasone  (FLONASE ) 50 MCG/ACT nasal spray Place 1-2 sprays into both nostrils daily. (Patient taking differently: Place 1-2 sprays into both nostrils daily as needed for allergies.) 48 mL 3   Magnesium  250 MG TABS Take 250 mg by mouth daily.     omeprazole  (PRILOSEC) 20 MG capsule Take  1 capsule  2 x / day  (every 12 hours)  to Prevent Indigestion & Heartburn                                   /                                                                    TAKE                                         BY                                                 MOUTH 180 capsule 3   Oxycodone  HCl 10 MG TABS Take 10 mg by mouth 4 (four) times daily.     sildenafil  (VIAGRA ) 100 MG tablet Take 1 tablet (100 mg total) by mouth as needed for erectile dysfunction. 30 tablet 1   tamsulosin  (FLOMAX ) 0.4 MG CAPS capsule Take 0.4 mg by mouth daily.     testosterone  cypionate (DEPOTESTOSTERONE CYPIONATE) 200 MG/ML injection INJECT 1 ML INTO MUSCLE EVERY 7 DAYS 13 mL 1   vitamin B-12 (CYANOCOBALAMIN ) 1000 MCG tablet Take 1,000 mcg by mouth daily.     VITAMIN D  PO Take 5,000 Units by mouth daily.     zinc gluconate 50 MG tablet Take 50 mg by mouth daily.     olmesartan  (BENICAR ) 40 MG tablet Take  1 tablet  Daily for BP                                                    /                                             TAKE  BY                                                 MOUTH 90 tablet 3     Physical Exam: Left shoulder demonstrates painful and guarded motion is noted at his recent office visits.  He has globally decreased strength to manual muscle testing.  Neurovascular intact the left upper extremity.  Examination otherwise as documented at his recent office visits.  Imaging studies confirm changes consistent with chronic left shoulder rotator cuff tear arthropathy.  Vitals     Assessment/Plan  Impression: LEFT shoulder rotator cuff tear arthropathy  Plan of Action: Procedures: ARTHROPLASTY, SHOULDER, TOTAL, REVERSE  Bryndan Bilyk M Franco Duley 09/19/2024, 5:45 AM Contact # [phone]

## 2024-09-19 NOTE — Evaluation (Addendum)
 Occupational Therapy Evaluation Patient Details Name: Jacob Larsen MRN: [id number] DOB: 1950-12-13 Today's Date: 09/19/2024   History of Present Illness   Pt is a 74 y.o. male s/p L shoulder reverse arthoplasty on 09/19/24.     Clinical Impressions Pt admitted with above. Prior to admission, pt was independent including driving and lives with family. Daughter in law present and supportive for education and instruction. Education completed regarding compensatory strategies for ADL tasks and functional mobility, management of sling, LUE ROM per specified parameters in the order set as indicated below, positioning of operative arm in sitting and supine and edema control, including use of Iceman Cold Therapy machine. Caregiver present for education, written handouts provided and reviewed using Teach Back and pt/caregiver verbalized/demonstrated understanding. Due to the below listed deficits, pt requires MIN assistance with ADL tasks and CGA assist with functional mobility. Caregiver will be able to provide necessary level of assistance at discharge. Pt to follow up with MD to progress rehab of the operative shoulder.      If plan is discharge home, recommend the following:   A little help with walking and/or transfers;A little help with bathing/dressing/bathroom;Assist for transportation;Help with stairs or ramp for entrance     Functional Status Assessment   Patient has had a recent decline in their functional status and demonstrates the ability to make significant improvements in function in a reasonable and predictable amount of time.     Equipment Recommendations   None recommended by OT      Precautions/Restrictions   Precautions Precautions: Shoulder Type of Shoulder Precautions: If sitting in controlled environment, ok to come out of sling to give neck a break. Please sleep in it to protect until follow up in office. OK to use operative arm for feeding, hygiene and ADLs.  Ok to instruct Pendulums and lap slides as exercises. Ok to use operative arm within the following parameters for ADL purposes. Ok for PROM, AAROM, AROM within pain tolerance and within the following ROM   ER 20   ABD 45   FE 60 Beach ball approach  Shoulder Interventions: Off for dressing/bathing/exercises;At all times;Shoulder sling/immobilizer Precaution Booklet Issued: Yes (comment) Recall of Precautions/Restrictions: Intact Required Braces or Orthoses: Sling Restrictions Weight Bearing Restrictions Per Provider Order: Yes LUE Weight Bearing Per Provider Order: Non weight bearing     Mobility         Transfers Overall transfer level: Needs assistance Equipment used: None Transfers: Sit to/from Stand Sit to Stand: Contact guard assist                  Balance Overall balance assessment: Mild deficits observed, not formally tested                                         ADL either performed or assessed with clinical judgement   ADL Overall ADL's : Needs assistance/impaired     Grooming: Wash/dry hands;Standing;Contact guard assist                   Toilet Transfer: Contact guard assist;Ambulation;Grab bars Toilet Transfer Details (indicate cue type and reason): to / from hallway bathroom in PACU with CGA Toileting- Clothing Manipulation and Hygiene: Minimal assistance;Sitting/lateral lean Toileting - Clothing Manipulation Details (indicate cue type and reason): stands for cont urine void, minA for clothing mgmt     Functional mobility during ADLs: Contact guard  assist  Pt/caregiver educated on compensatory strategies for UB/LB ADL and strategies to reduce risk of falls. Pt/caregiver educated on donning/doffing sling and to wear the sling at all times with the exception of ADL, and to loosen the neck strap of the sling when the operative arm is in a supported position when sitting. Discussed UB compensatory dressing techniques. In sitting or  supine, pt instructed to have a pillow behind and under their operative arm to provide support. If assist needed with ambulation, caregiver educated on the importance of walking on pt's non-operative side.  Education regarding use of IceMan Cold Therapy completed, including the importance of using a barrier on the shoulder prior to positioning the wrap-on pad. Pt/caregiver verbalized/demonstrated understanding. Teach Back used while caregiver assisted with dressing pt and positioning wrap-on pad to facilitate DC.      Vision Baseline Vision/History: 1 Wears glasses Ability to See in Adequate Light: 0 Adequate              Pertinent Vitals/Pain Pain Assessment Pain Assessment: 0-10 Pain Score: 3  Pain Location: LUE Pain Descriptors / Indicators: Aching Pain Intervention(s): Limited activity within patient's tolerance, Monitored during session, Premedicated before session     Extremity/Trunk Assessment Upper Extremity Assessment Upper Extremity Assessment: LUE deficits/detail;Right hand dominant LUE: Unable to fully assess due to immobilization LUE Sensation: decreased light touch (nerve block in place) LUE Coordination: decreased fine motor;decreased gross motor   Lower Extremity Assessment Lower Extremity Assessment: Overall WFL for tasks assessed   Cervical / Trunk Assessment Cervical / Trunk Assessment: Kyphotic   Communication Communication Communication: No apparent difficulties   Cognition Arousal: Alert Behavior During Therapy: WFL for tasks assessed/performed Cognition: No apparent impairments                               Following commands: Intact       Cueing  General Comments   Cueing Techniques: Verbal cues      Exercises Exercises: Shoulder   Shoulder Instructions Shoulder Instructions Donning/doffing shirt without moving shoulder: Minimal assistance;Caregiver independent with task Method for sponge bathing under operated UE:  Caregiver independent with task;Minimal assistance Donning/doffing sling/immobilizer: Minimal assistance;Caregiver independent with task Correct positioning of sling/immobilizer: Minimal assistance;Caregiver independent with task ROM for elbow, wrist and digits of operated UE: Minimal assistance;Caregiver independent with task Sling wearing schedule (on at all times/off for ADL's): Caregiver independent with task;Minimal assistance Proper positioning of operated UE when showering: Minimal assistance;Caregiver independent with task Positioning of UE while sleeping: Caregiver independent with task;Minimal assistance    Home Living Family/patient expects to be discharged to:: Private residence Living Arrangements: Spouse/significant other Available Help at Discharge: Available PRN/intermittently;Available 24 hours/day;Family (family will be providing 24/7 support and assistance) Type of Home: House Home Access: Stairs to enter [employer] of Steps: 1 Entrance Stairs-Rails: None Home Layout: One level     Bathroom Shower/Tub: Producer, Television/film/video: Handicapped height     Home Equipment: Pharmacist, Hospital (2 wheels);Cane - single point;Toilet riser          Prior Functioning/Environment Prior Level of Function : Independent/Modified Independent;Driving             Mobility Comments: independent, driving ADLs Comments: independent, driving    OT Problem List: Decreased strength;Decreased range of motion;Impaired UE functional use;Decreased knowledge of use of DME or AE;Decreased knowledge of precautions        OT Goals(Current goals can  be found in the care plan section)   Acute Rehab OT Goals OT Goal Formulation: All assessment and education complete, DC therapy Time For Goal Achievement: 10/03/24 Potential to Achieve Goals: Good   AM-PAC OT 6 Clicks Daily Activity     Outcome Measure Help from another person eating meals?: None Help  from another person taking care of personal grooming?: None Help from another person toileting, which includes using toliet, bedpan, or urinal?: A Little Help from another person bathing (including washing, rinsing, drying)?: A Little Help from another person to put on and taking off regular upper body clothing?: A Little Help from another person to put on and taking off regular lower body clothing?: A Little 6 Click Score: 20   End of Session Equipment Utilized During Treatment: Other (comment) (Sling) Nurse Communication: Mobility status;Other (comment) (ADL edu complete)  Activity Tolerance: Patient tolerated treatment well;No increased pain Patient left: in chair;with call bell/phone within reach;with family/visitor present  OT Visit Diagnosis: Pain;Other abnormalities of gait and mobility (R26.89) Pain - Right/Left: Left Pain - part of body: Shoulder;Arm                Time: 8864-8777 OT Time Calculation (min): 47 min Charges:  OT General Charges $OT Visit: 1 Visit OT Evaluation $OT Eval Low Complexity: 1 Low OT Treatments $Self Care/Home Management : 38-52 mins  Darleny Sem L. Aubery Date, OTR/L  09/19/2024, 12:59 PM

## 2024-09-19 NOTE — Anesthesia Procedure Notes (Signed)
 Procedure Name: Intubation Date/Time: 09/19/2024 7:43 AM  Performed by: Dartha Meckel, CRNAPre-anesthesia Checklist: Patient identified, Emergency Drugs available, Suction available and Patient being monitored Patient Re-evaluated:Patient Re-evaluated prior to induction Oxygen Delivery Method: Circle system utilized Preoxygenation: Pre-oxygenation with 100% oxygen Induction Type: IV induction Ventilation: Mask ventilation without difficulty Laryngoscope Size: Glidescope and 4 Grade View: Grade I Tube type: Oral Tube size: 7.5 mm Number of attempts: 1 Airway Equipment and Method: Stylet and Oral airway Placement Confirmation: ETT inserted through vocal cords under direct vision, positive ETCO2 and breath sounds checked- equal and bilateral Secured at: 21 cm Tube secured with: Tape Dental Injury: Teeth and Oropharynx as per pre-operative assessment

## 2024-09-22 MED ORDER — BUPIVACAINE LIPOSOME 1.3 % IJ SUSP: INTRAMUSCULAR | Status: DC | PRN

## 2024-09-22 MED ORDER — BUPIVACAINE-EPINEPHRINE (PF) 0.5% -1:200000 IJ SOLN
INTRAMUSCULAR | Status: DC | PRN
  Administered 2024-09-19: 10 mL

## 2024-09-22 MED ORDER — BUPIVACAINE LIPOSOME 1.3 % IJ SUSP
INTRAMUSCULAR | Status: DC | PRN
  Administered 2024-09-19: 10 mL

## 2024-09-22 NOTE — Anesthesia Procedure Notes (Signed)
 Anesthesia Regional Block: Interscalene brachial plexus block   Pre-Anesthetic Checklist: , timeout performed,  Correct Patient, Correct Site, Correct Laterality,  Correct Procedure, Correct Position, site marked,  Risks and benefits discussed,  At surgeon's request and post-op pain management  Laterality: Upper and Left  Prep: chloraprep       Needles:  Injection technique: Single-shot  Needle Type: Echogenic Stimulator Needle     Needle Length: 9cm  Needle Gauge: 20     Additional Needles:   Procedures:, nerve stimulator,,, ultrasound used (permanent image in chart),, #20gu IV placed    Narrative:  Start time: 09/19/2024 6:55 AM End time: 09/19/2024 6:55 AM Injection made incrementally with aspirations every 5 mL.  Performed by: Personally  Anesthesiologist: Colhoun, Lauraine DASEN, MD

## 2024-09-22 NOTE — Anesthesia Postprocedure Evaluation (Signed)
"   Anesthesia Post Note  Patient: Jacob Larsen  Procedure(s) Performed: ARTHROPLASTY, SHOULDER, TOTAL, REVERSE (Left: Shoulder)     Patient location during evaluation: PACU Anesthesia Type: General Level of consciousness: awake Pain management: pain level controlled Vital Signs Assessment: post-procedure vital signs reviewed and stable Respiratory status: spontaneous breathing Cardiovascular status: blood pressure returned to baseline Postop Assessment: no apparent nausea or vomiting Anesthetic complications: no   No notable events documented.  Last Vitals:  Vitals:   09/19/24 1045 09/19/24 1106  BP: (!) 121/90 120/82  Pulse: (!) 57 (!) 57  Resp: 12 14  Temp:  36.6 C  SpO2: 97% 96%                Layana Konkel T Colhoun      "

## 2024-09-23 ENCOUNTER — Encounter: Payer: Medicare Other | Admitting: Internal Medicine

## 2024-09-25 ENCOUNTER — Encounter (HOSPITAL_COMMUNITY): Payer: Self-pay | Admitting: Orthopedic Surgery

## 2024-10-02 ENCOUNTER — Other Ambulatory Visit (HOSPITAL_BASED_OUTPATIENT_CLINIC_OR_DEPARTMENT_OTHER): Payer: Self-pay | Admitting: Family Medicine

## 2024-12-24 ENCOUNTER — Ambulatory Visit (HOSPITAL_BASED_OUTPATIENT_CLINIC_OR_DEPARTMENT_OTHER): Admitting: Family Medicine
